# Patient Record
Sex: Male | Born: 1935 | Race: White | Hispanic: No | Marital: Married | Smoking: Former smoker
Health system: Southern US, Community
[De-identification: ages and names within clinical notes are randomized; demographics above are authoritative.]

## PROBLEM LIST (undated history)

## (undated) ENCOUNTER — Ambulatory Visit

## (undated) DIAGNOSIS — E785 Hyperlipidemia, unspecified: Secondary | ICD-10-CM

## (undated) DIAGNOSIS — I2510 Atherosclerotic heart disease of native coronary artery without angina pectoris: Secondary | ICD-10-CM

## (undated) DIAGNOSIS — Z8546 Personal history of malignant neoplasm of prostate: Secondary | ICD-10-CM

## (undated) DIAGNOSIS — B0229 Other postherpetic nervous system involvement: Secondary | ICD-10-CM

## (undated) DIAGNOSIS — E0590 Thyrotoxicosis, unspecified without thyrotoxic crisis or storm: Secondary | ICD-10-CM

## (undated) DIAGNOSIS — H9190 Unspecified hearing loss, unspecified ear: Secondary | ICD-10-CM

## (undated) DIAGNOSIS — R7303 Prediabetes: Secondary | ICD-10-CM

## (undated) DIAGNOSIS — G5792 Unspecified mononeuropathy of left lower limb: Secondary | ICD-10-CM

## (undated) DIAGNOSIS — Z860100 Personal history of colon polyps, unspecified: Secondary | ICD-10-CM

## (undated) DIAGNOSIS — N189 Chronic kidney disease, unspecified: Secondary | ICD-10-CM

## (undated) DIAGNOSIS — I10 Essential (primary) hypertension: Secondary | ICD-10-CM

## (undated) DIAGNOSIS — G609 Hereditary and idiopathic neuropathy, unspecified: Secondary | ICD-10-CM

## (undated) DIAGNOSIS — IMO0002 Reserved for concepts with insufficient information to code with codable children: Secondary | ICD-10-CM

## (undated) DIAGNOSIS — K219 Gastro-esophageal reflux disease without esophagitis: Secondary | ICD-10-CM

## (undated) DIAGNOSIS — R519 Headache, unspecified: Secondary | ICD-10-CM

## (undated) DIAGNOSIS — H8110 Benign paroxysmal vertigo, unspecified ear: Secondary | ICD-10-CM

## (undated) DIAGNOSIS — I872 Venous insufficiency (chronic) (peripheral): Secondary | ICD-10-CM

## (undated) DIAGNOSIS — M545 Low back pain: Secondary | ICD-10-CM

## (undated) DIAGNOSIS — I509 Heart failure, unspecified: Secondary | ICD-10-CM

## (undated) DIAGNOSIS — K5150 Left sided colitis without complications: Secondary | ICD-10-CM

## (undated) DIAGNOSIS — Z9889 Other specified postprocedural states: Secondary | ICD-10-CM

## (undated) DIAGNOSIS — G2581 Restless legs syndrome: Secondary | ICD-10-CM

## (undated) DIAGNOSIS — C801 Malignant (primary) neoplasm, unspecified: Secondary | ICD-10-CM

## (undated) DIAGNOSIS — L500 Allergic urticaria: Secondary | ICD-10-CM

## (undated) HISTORY — DX: Left sided colitis without complications: K51.50

## (undated) HISTORY — PX: RETINAL LASER PROCEDURE: SHX2339

## (undated) HISTORY — DX: Essential (primary) hypertension: I10

## (undated) HISTORY — DX: Reserved for concepts with insufficient information to code with codable children: IMO0002

## (undated) HISTORY — DX: Other postherpetic nervous system involvement: B02.29

## (undated) HISTORY — DX: Chronic kidney disease, unspecified: N18.9

## (undated) HISTORY — DX: Gastro-esophageal reflux disease without esophagitis: K21.9

## (undated) HISTORY — PX: ROTATOR CUFF REPAIR: SHX139

## (undated) HISTORY — DX: Thyrotoxicosis, unspecified without thyrotoxic crisis or storm: E05.90

## (undated) HISTORY — DX: Hereditary and idiopathic neuropathy, unspecified: G60.9

## (undated) HISTORY — DX: Heart failure, unspecified: I50.9

## (undated) HISTORY — DX: Allergic urticaria: L50.0

## (undated) HISTORY — DX: Personal history of colonic polyps: Z86.010

## (undated) HISTORY — DX: Venous insufficiency (chronic) (peripheral): I87.2

## (undated) HISTORY — DX: Personal history of colon polyps, unspecified: Z86.0100

## (undated) HISTORY — DX: Restless legs syndrome: G25.81

## (undated) HISTORY — DX: Unspecified mononeuropathy of left lower limb: G57.92

## (undated) HISTORY — DX: Atherosclerotic heart disease of native coronary artery without angina pectoris: I25.10

## (undated) HISTORY — DX: Personal history of malignant neoplasm of prostate: Z85.46

## (undated) HISTORY — PX: TONSILLECTOMY: SUR1361

## (undated) HISTORY — DX: Benign paroxysmal vertigo, unspecified ear: H81.10

## (undated) HISTORY — PX: PROSTATECTOMY: SHX69

## (undated) HISTORY — PX: ANGIOPLASTY: SHX39

## (undated) HISTORY — DX: Hyperlipidemia, unspecified: E78.5

## (undated) HISTORY — DX: Low back pain: M54.5

## (undated) HISTORY — DX: Unspecified hearing loss, unspecified ear: H91.90

## (undated) HISTORY — PX: INGUINAL HERNIA REPAIR: SUR1180

---
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
## 1998-02-08 ENCOUNTER — Ambulatory Visit (HOSPITAL_COMMUNITY): Admission: RE | Admit: 1998-02-08 | Discharge: 1998-02-08 | Payer: Self-pay | Admitting: Urology

## 1998-07-26 ENCOUNTER — Ambulatory Visit (HOSPITAL_COMMUNITY): Admission: RE | Admit: 1998-07-26 | Discharge: 1998-07-26 | Payer: Self-pay | Admitting: *Deleted

## 1998-09-07 DIAGNOSIS — K5150 Left sided colitis without complications: Secondary | ICD-10-CM

## 1998-09-07 HISTORY — DX: Left sided colitis without complications: K51.50

## 1999-04-02 ENCOUNTER — Ambulatory Visit (HOSPITAL_COMMUNITY): Admission: RE | Admit: 1999-04-02 | Discharge: 1999-04-03 | Payer: Self-pay | Admitting: Cardiology

## 1999-04-15 ENCOUNTER — Encounter (HOSPITAL_COMMUNITY): Admission: RE | Admit: 1999-04-15 | Discharge: 1999-07-14 | Payer: Self-pay | Admitting: Cardiology

## 2001-01-31 ENCOUNTER — Emergency Department (HOSPITAL_COMMUNITY): Admission: EM | Admit: 2001-01-31 | Discharge: 2001-01-31 | Payer: Self-pay | Admitting: Emergency Medicine

## 2001-01-31 ENCOUNTER — Encounter: Payer: Self-pay | Admitting: Emergency Medicine

## 2003-05-11 ENCOUNTER — Encounter (INDEPENDENT_AMBULATORY_CARE_PROVIDER_SITE_OTHER): Payer: Self-pay | Admitting: Specialist

## 2003-05-11 ENCOUNTER — Encounter: Payer: Self-pay | Admitting: Internal Medicine

## 2003-05-11 ENCOUNTER — Ambulatory Visit (HOSPITAL_COMMUNITY): Admission: RE | Admit: 2003-05-11 | Discharge: 2003-05-11 | Payer: Self-pay | Admitting: *Deleted

## 2003-06-28 ENCOUNTER — Ambulatory Visit: Admission: RE | Admit: 2003-06-28 | Discharge: 2003-08-30 | Payer: Self-pay | Admitting: Radiation Oncology

## 2005-06-26 ENCOUNTER — Encounter: Payer: Self-pay | Admitting: Internal Medicine

## 2005-06-26 ENCOUNTER — Encounter (INDEPENDENT_AMBULATORY_CARE_PROVIDER_SITE_OTHER): Payer: Self-pay | Admitting: Specialist

## 2005-06-26 ENCOUNTER — Ambulatory Visit (HOSPITAL_COMMUNITY): Admission: RE | Admit: 2005-06-26 | Discharge: 2005-06-26 | Payer: Self-pay | Admitting: *Deleted

## 2006-12-06 ENCOUNTER — Encounter: Payer: Self-pay | Admitting: Internal Medicine

## 2007-02-24 ENCOUNTER — Encounter: Payer: Self-pay | Admitting: Internal Medicine

## 2007-05-20 ENCOUNTER — Encounter: Payer: Self-pay | Admitting: Internal Medicine

## 2007-07-07 ENCOUNTER — Ambulatory Visit: Payer: Self-pay | Admitting: Family Medicine

## 2007-07-07 DIAGNOSIS — I10 Essential (primary) hypertension: Secondary | ICD-10-CM | POA: Insufficient documentation

## 2007-07-07 DIAGNOSIS — I2510 Atherosclerotic heart disease of native coronary artery without angina pectoris: Secondary | ICD-10-CM | POA: Insufficient documentation

## 2007-07-07 DIAGNOSIS — I25119 Atherosclerotic heart disease of native coronary artery with unspecified angina pectoris: Secondary | ICD-10-CM | POA: Insufficient documentation

## 2007-07-07 DIAGNOSIS — R197 Diarrhea, unspecified: Secondary | ICD-10-CM | POA: Insufficient documentation

## 2007-07-07 DIAGNOSIS — Z8546 Personal history of malignant neoplasm of prostate: Secondary | ICD-10-CM

## 2007-07-07 HISTORY — DX: Personal history of malignant neoplasm of prostate: Z85.46

## 2007-07-21 ENCOUNTER — Encounter: Payer: Self-pay | Admitting: Family Medicine

## 2007-09-19 ENCOUNTER — Encounter: Payer: Self-pay | Admitting: Internal Medicine

## 2007-09-30 DIAGNOSIS — H8110 Benign paroxysmal vertigo, unspecified ear: Secondary | ICD-10-CM

## 2007-09-30 DIAGNOSIS — H8113 Benign paroxysmal vertigo, bilateral: Secondary | ICD-10-CM | POA: Insufficient documentation

## 2007-09-30 HISTORY — DX: Benign paroxysmal vertigo, unspecified ear: H81.10

## 2007-10-11 ENCOUNTER — Ambulatory Visit: Payer: Self-pay | Admitting: Family Medicine

## 2007-10-11 DIAGNOSIS — G609 Hereditary and idiopathic neuropathy, unspecified: Secondary | ICD-10-CM

## 2007-10-11 HISTORY — DX: Hereditary and idiopathic neuropathy, unspecified: G60.9

## 2007-10-11 LAB — CONVERTED CEMR LAB
ALT: 26 units/L (ref 0–53)
AST: 20 units/L (ref 0–37)
Albumin: 4.3 g/dL (ref 3.5–5.2)
Alkaline Phosphatase: 69 units/L (ref 39–117)
BUN: 19 mg/dL (ref 6–23)
Basophils Absolute: 0 10*3/uL (ref 0.0–0.1)
Basophils Relative: 0.5 % (ref 0.0–1.0)
Bilirubin, Direct: 0.2 mg/dL (ref 0.0–0.3)
CO2: 26 meq/L (ref 19–32)
Calcium: 9.3 mg/dL (ref 8.4–10.5)
Chloride: 104 meq/L (ref 96–112)
Creatinine, Ser: 1.4 mg/dL (ref 0.4–1.5)
Eosinophils Absolute: 0.2 10*3/uL (ref 0.0–0.6)
Eosinophils Relative: 4.1 % (ref 0.0–5.0)
Folate: 8.2 ng/mL
GFR calc Af Amer: 65 mL/min
GFR calc non Af Amer: 53 mL/min
Glucose, Bld: 103 mg/dL — ABNORMAL HIGH (ref 70–99)
HCT: 42.1 % (ref 39.0–52.0)
Hemoglobin: 14 g/dL (ref 13.0–17.0)
Hgb A1c MFr Bld: 5.7 % (ref 4.6–6.0)
Lymphocytes Relative: 33.6 % (ref 12.0–46.0)
MCHC: 33.2 g/dL (ref 30.0–36.0)
MCV: 87.7 fL (ref 78.0–100.0)
Monocytes Absolute: 0.5 10*3/uL (ref 0.2–0.7)
Monocytes Relative: 9 % (ref 3.0–11.0)
Neutro Abs: 2.8 10*3/uL (ref 1.4–7.7)
Neutrophils Relative %: 52.8 % (ref 43.0–77.0)
Platelets: 229 10*3/uL (ref 150–400)
Potassium: 4.1 meq/L (ref 3.5–5.1)
RBC: 4.8 M/uL (ref 4.22–5.81)
RDW: 12.8 % (ref 11.5–14.6)
Sodium: 138 meq/L (ref 135–145)
TSH: 1.66 microintl units/mL (ref 0.35–5.50)
Total Bilirubin: 0.8 mg/dL (ref 0.3–1.2)
Total Protein: 6.5 g/dL (ref 6.0–8.3)
Vitamin B-12: 220 pg/mL (ref 211–911)
WBC: 5.3 10*3/uL (ref 4.5–10.5)

## 2007-10-14 ENCOUNTER — Telehealth (INDEPENDENT_AMBULATORY_CARE_PROVIDER_SITE_OTHER): Payer: Self-pay | Admitting: *Deleted

## 2007-12-05 ENCOUNTER — Encounter: Payer: Self-pay | Admitting: Internal Medicine

## 2008-02-02 ENCOUNTER — Ambulatory Visit: Payer: Self-pay | Admitting: Internal Medicine

## 2008-02-29 ENCOUNTER — Inpatient Hospital Stay (HOSPITAL_COMMUNITY): Admission: AD | Admit: 2008-02-29 | Discharge: 2008-03-02 | Payer: Self-pay | Admitting: Interventional Cardiology

## 2008-03-15 ENCOUNTER — Encounter (HOSPITAL_COMMUNITY): Admission: RE | Admit: 2008-03-15 | Discharge: 2008-06-13 | Payer: Self-pay | Admitting: Cardiology

## 2008-03-19 ENCOUNTER — Telehealth: Payer: Self-pay | Admitting: *Deleted

## 2008-04-16 ENCOUNTER — Ambulatory Visit: Payer: Self-pay | Admitting: Family Medicine

## 2008-04-16 LAB — CONVERTED CEMR LAB
ALT: 22 units/L (ref 0–53)
AST: 21 units/L (ref 0–37)
Albumin: 4.3 g/dL (ref 3.5–5.2)
Alkaline Phosphatase: 62 units/L (ref 39–117)
BUN: 16 mg/dL (ref 6–23)
Basophils Absolute: 0 10*3/uL (ref 0.0–0.1)
Basophils Relative: 0.6 % (ref 0.0–3.0)
Bilirubin Urine: NEGATIVE
Bilirubin, Direct: 0.1 mg/dL (ref 0.0–0.3)
Blood in Urine, dipstick: NEGATIVE
CO2: 31 meq/L (ref 19–32)
Calcium: 9.3 mg/dL (ref 8.4–10.5)
Chloride: 105 meq/L (ref 96–112)
Cholesterol: 137 mg/dL (ref 0–200)
Creatinine, Ser: 1.3 mg/dL (ref 0.4–1.5)
Direct LDL: 54.8 mg/dL
Eosinophils Absolute: 0.2 10*3/uL (ref 0.0–0.7)
Eosinophils Relative: 3.6 % (ref 0.0–5.0)
GFR calc Af Amer: 70 mL/min
GFR calc non Af Amer: 58 mL/min
Glucose, Bld: 103 mg/dL — ABNORMAL HIGH (ref 70–99)
Glucose, Urine, Semiquant: NEGATIVE
HCT: 41 % (ref 39.0–52.0)
HDL: 43.8 mg/dL (ref >39.0)
Hemoglobin: 14.1 g/dL (ref 13.0–17.0)
Ketones, urine, test strip: NEGATIVE
Lymphocytes Relative: 24.8 % (ref 12.0–46.0)
MCHC: 34.4 g/dL (ref 30.0–36.0)
MCV: 88.8 fL (ref 78.0–100.0)
Monocytes Absolute: 0.4 10*3/uL (ref 0.1–1.0)
Monocytes Relative: 7.3 % (ref 3.0–12.0)
Neutro Abs: 3.8 10*3/uL (ref 1.4–7.7)
Neutrophils Relative %: 63.7 % (ref 43.0–77.0)
Nitrite: NEGATIVE
PSA: 0.14 ng/mL (ref 0.10–4.00)
Platelets: 232 10*3/uL (ref 150–400)
Potassium: 4.4 meq/L (ref 3.5–5.1)
Protein, U semiquant: NEGATIVE
RBC: 4.62 M/uL (ref 4.22–5.81)
RDW: 13.2 % (ref 11.5–14.6)
Sodium: 140 meq/L (ref 135–145)
Specific Gravity, Urine: 1.015
TSH: 2.6 microintl units/mL (ref 0.35–5.50)
Total Bilirubin: 0.9 mg/dL (ref 0.3–1.2)
Total CHOL/HDL Ratio: 3.1
Total Protein: 7 g/dL (ref 6.0–8.3)
Triglycerides: 209 mg/dL (ref 0–149)
Urobilinogen, UA: 0.2
VLDL: 42 mg/dL — ABNORMAL HIGH (ref 0–40)
WBC Urine, dipstick: NEGATIVE
WBC: 5.8 10*3/uL (ref 4.5–10.5)
pH: 6.5

## 2008-04-18 ENCOUNTER — Ambulatory Visit: Payer: Self-pay | Admitting: Family Medicine

## 2008-04-18 DIAGNOSIS — I739 Peripheral vascular disease, unspecified: Secondary | ICD-10-CM | POA: Insufficient documentation

## 2008-04-18 DIAGNOSIS — K219 Gastro-esophageal reflux disease without esophagitis: Secondary | ICD-10-CM | POA: Insufficient documentation

## 2008-04-19 ENCOUNTER — Encounter: Payer: Self-pay | Admitting: Internal Medicine

## 2008-06-04 ENCOUNTER — Encounter: Payer: Self-pay | Admitting: Internal Medicine

## 2008-06-13 ENCOUNTER — Encounter: Payer: Self-pay | Admitting: Internal Medicine

## 2008-06-14 ENCOUNTER — Encounter (HOSPITAL_COMMUNITY): Admission: RE | Admit: 2008-06-14 | Discharge: 2008-09-03 | Payer: Self-pay | Admitting: Cardiology

## 2008-06-14 ENCOUNTER — Encounter: Payer: Self-pay | Admitting: Internal Medicine

## 2008-08-20 ENCOUNTER — Encounter: Payer: Self-pay | Admitting: Internal Medicine

## 2008-10-11 ENCOUNTER — Encounter: Payer: Self-pay | Admitting: Internal Medicine

## 2008-11-08 ENCOUNTER — Encounter: Payer: Self-pay | Admitting: Internal Medicine

## 2008-12-14 ENCOUNTER — Encounter: Payer: Self-pay | Admitting: Internal Medicine

## 2009-02-05 ENCOUNTER — Telehealth: Payer: Self-pay | Admitting: Family Medicine

## 2009-04-22 ENCOUNTER — Telehealth: Payer: Self-pay | Admitting: Family Medicine

## 2009-05-06 ENCOUNTER — Encounter: Payer: Self-pay | Admitting: Internal Medicine

## 2009-05-08 ENCOUNTER — Ambulatory Visit: Payer: Self-pay | Admitting: Family Medicine

## 2009-05-08 LAB — CONVERTED CEMR LAB
Albumin: 4.2 g/dL (ref 3.5–5.2)
Alkaline Phosphatase: 58 units/L (ref 39–117)
BUN: 16 mg/dL (ref 6–23)
Basophils Absolute: 0 10*3/uL (ref 0.0–0.1)
Blood in Urine, dipstick: NEGATIVE
CO2: 29 meq/L (ref 19–32)
Calcium: 9.2 mg/dL (ref 8.4–10.5)
Cholesterol: 128 mg/dL (ref 0–200)
Creatinine, Ser: 1.2 mg/dL (ref 0.4–1.5)
Eosinophils Absolute: 0.3 10*3/uL (ref 0.0–0.7)
GFR calc non Af Amer: 63.44 mL/min (ref >60)
Glucose, Bld: 99 mg/dL (ref 70–99)
Glucose, Urine, Semiquant: NEGATIVE
HDL: 44.2 mg/dL (ref >39.00)
Hemoglobin: 14.5 g/dL (ref 13.0–17.0)
Ketones, urine, test strip: NEGATIVE
Lymphocytes Relative: 35.1 % (ref 12.0–46.0)
MCHC: 34.3 g/dL (ref 30.0–36.0)
Neutro Abs: 2.5 10*3/uL (ref 1.4–7.7)
Neutrophils Relative %: 51.3 % (ref 43.0–77.0)
Platelets: 200 10*3/uL (ref 150.0–400.0)
Protein, U semiquant: NEGATIVE
RDW: 13.5 % (ref 11.5–14.6)
Specific Gravity, Urine: >=1.03
Total Bilirubin: 0.8 mg/dL (ref 0.3–1.2)
Triglycerides: 151 mg/dL — ABNORMAL HIGH (ref 0.0–149.0)
VLDL: 30.2 mg/dL (ref 0.0–40.0)
pH: 5

## 2009-05-31 ENCOUNTER — Ambulatory Visit: Payer: Self-pay | Admitting: Family Medicine

## 2009-06-03 ENCOUNTER — Emergency Department (HOSPITAL_COMMUNITY): Admission: EM | Admit: 2009-06-03 | Discharge: 2009-06-03 | Payer: Self-pay | Admitting: Emergency Medicine

## 2009-06-06 ENCOUNTER — Encounter (INDEPENDENT_AMBULATORY_CARE_PROVIDER_SITE_OTHER): Payer: Self-pay | Admitting: *Deleted

## 2009-06-06 ENCOUNTER — Encounter: Payer: Self-pay | Admitting: Internal Medicine

## 2009-06-06 HISTORY — PX: COLONOSCOPY W/ BIOPSIES AND POLYPECTOMY: SHX1376

## 2009-07-30 ENCOUNTER — Ambulatory Visit: Payer: Self-pay | Admitting: Family Medicine

## 2009-07-30 DIAGNOSIS — L0291 Cutaneous abscess, unspecified: Secondary | ICD-10-CM | POA: Insufficient documentation

## 2009-07-30 DIAGNOSIS — L0390 Cellulitis, unspecified: Secondary | ICD-10-CM

## 2009-07-31 ENCOUNTER — Ambulatory Visit: Payer: Self-pay | Admitting: Family Medicine

## 2009-08-05 DIAGNOSIS — L500 Allergic urticaria: Secondary | ICD-10-CM

## 2009-08-05 HISTORY — DX: Allergic urticaria: L50.0

## 2009-08-07 ENCOUNTER — Ambulatory Visit: Payer: Self-pay | Admitting: Family Medicine

## 2009-10-18 ENCOUNTER — Ambulatory Visit: Payer: Self-pay | Admitting: Family Medicine

## 2009-10-18 DIAGNOSIS — M545 Low back pain, unspecified: Secondary | ICD-10-CM

## 2009-10-18 HISTORY — DX: Low back pain, unspecified: M54.50

## 2009-10-18 LAB — CONVERTED CEMR LAB
Bilirubin Urine: NEGATIVE
Glucose, Urine, Semiquant: NEGATIVE
Ketones, urine, test strip: NEGATIVE
Protein, U semiquant: NEGATIVE
pH: 6

## 2009-10-21 ENCOUNTER — Encounter (INDEPENDENT_AMBULATORY_CARE_PROVIDER_SITE_OTHER): Payer: Self-pay | Admitting: *Deleted

## 2009-11-25 ENCOUNTER — Ambulatory Visit: Payer: Self-pay | Admitting: Internal Medicine

## 2009-12-27 DIAGNOSIS — Z860100 Personal history of colon polyps, unspecified: Secondary | ICD-10-CM | POA: Insufficient documentation

## 2009-12-27 DIAGNOSIS — K5150 Left sided colitis without complications: Secondary | ICD-10-CM | POA: Insufficient documentation

## 2010-01-22 ENCOUNTER — Ambulatory Visit: Payer: Self-pay | Admitting: Family Medicine

## 2010-01-23 ENCOUNTER — Telehealth: Payer: Self-pay | Admitting: Family Medicine

## 2010-02-06 ENCOUNTER — Encounter: Payer: Self-pay | Admitting: Family Medicine

## 2010-02-19 ENCOUNTER — Encounter: Payer: Self-pay | Admitting: Family Medicine

## 2010-02-20 ENCOUNTER — Telehealth: Payer: Self-pay | Admitting: Family Medicine

## 2010-02-25 ENCOUNTER — Encounter: Payer: Self-pay | Admitting: Family Medicine

## 2010-02-27 ENCOUNTER — Telehealth: Payer: Self-pay | Admitting: Family Medicine

## 2010-05-13 ENCOUNTER — Telehealth: Payer: Self-pay | Admitting: Family Medicine

## 2010-05-20 ENCOUNTER — Ambulatory Visit: Payer: Self-pay | Admitting: Family Medicine

## 2010-05-26 ENCOUNTER — Ambulatory Visit: Payer: Self-pay | Admitting: Family Medicine

## 2010-05-29 ENCOUNTER — Ambulatory Visit: Payer: Self-pay | Admitting: Family Medicine

## 2010-06-02 ENCOUNTER — Ambulatory Visit: Payer: Self-pay | Admitting: Family Medicine

## 2010-06-02 ENCOUNTER — Telehealth: Payer: Self-pay | Admitting: Family Medicine

## 2010-06-03 ENCOUNTER — Encounter: Payer: Self-pay | Admitting: Family Medicine

## 2010-06-06 ENCOUNTER — Ambulatory Visit: Payer: Self-pay | Admitting: Family Medicine

## 2010-06-06 LAB — CONVERTED CEMR LAB
AST: 17 units/L (ref 0–37)
Albumin: 4.5 g/dL (ref 3.5–5.2)
Basophils Absolute: 0 10*3/uL (ref 0.0–0.1)
CO2: 28 meq/L (ref 19–32)
Chloride: 98 meq/L (ref 96–112)
Eosinophils Absolute: 0.1 10*3/uL (ref 0.0–0.7)
Glucose, Bld: 100 mg/dL — ABNORMAL HIGH (ref 70–99)
Glucose, Urine, Semiquant: NEGATIVE
HCT: 41 % (ref 39.0–52.0)
Hemoglobin: 13.9 g/dL (ref 13.0–17.0)
Lymphs Abs: 2.3 10*3/uL (ref 0.7–4.0)
MCHC: 33.9 g/dL (ref 30.0–36.0)
Monocytes Relative: 4.1 % (ref 3.0–12.0)
Neutro Abs: 13 10*3/uL — ABNORMAL HIGH (ref 1.4–7.7)
Potassium: 4.4 meq/L (ref 3.5–5.1)
RDW: 14.1 % (ref 11.5–14.6)
Sodium: 134 meq/L — ABNORMAL LOW (ref 135–145)
Specific Gravity, Urine: >=1.03
TSH: 2.71 microintl units/mL (ref 0.35–5.50)
WBC Urine, dipstick: NEGATIVE
pH: 5

## 2010-06-12 ENCOUNTER — Ambulatory Visit: Payer: Self-pay | Admitting: Family Medicine

## 2010-06-12 ENCOUNTER — Encounter (INDEPENDENT_AMBULATORY_CARE_PROVIDER_SITE_OTHER): Payer: Self-pay | Admitting: *Deleted

## 2010-06-12 ENCOUNTER — Encounter: Payer: Self-pay | Admitting: Family Medicine

## 2010-06-16 ENCOUNTER — Ambulatory Visit: Payer: Self-pay | Admitting: Internal Medicine

## 2010-06-19 ENCOUNTER — Ambulatory Visit: Payer: Self-pay | Admitting: Family Medicine

## 2010-07-03 ENCOUNTER — Ambulatory Visit: Payer: Self-pay | Admitting: Family Medicine

## 2010-07-03 DIAGNOSIS — B0223 Postherpetic polyneuropathy: Secondary | ICD-10-CM | POA: Insufficient documentation

## 2010-07-03 DIAGNOSIS — B0229 Other postherpetic nervous system involvement: Secondary | ICD-10-CM

## 2010-07-03 HISTORY — DX: Other postherpetic nervous system involvement: B02.29

## 2010-07-28 ENCOUNTER — Ambulatory Visit: Payer: Self-pay | Admitting: Family Medicine

## 2010-07-28 DIAGNOSIS — I872 Venous insufficiency (chronic) (peripheral): Secondary | ICD-10-CM | POA: Insufficient documentation

## 2010-07-28 HISTORY — DX: Venous insufficiency (chronic) (peripheral): I87.2

## 2010-08-05 ENCOUNTER — Telehealth: Payer: Self-pay | Admitting: Family Medicine

## 2010-08-11 DIAGNOSIS — D649 Anemia, unspecified: Secondary | ICD-10-CM | POA: Insufficient documentation

## 2010-08-11 DIAGNOSIS — R079 Chest pain, unspecified: Secondary | ICD-10-CM | POA: Insufficient documentation

## 2010-08-12 ENCOUNTER — Ambulatory Visit: Payer: Self-pay | Admitting: Cardiology

## 2010-08-12 DIAGNOSIS — R0602 Shortness of breath: Secondary | ICD-10-CM | POA: Insufficient documentation

## 2010-08-13 ENCOUNTER — Encounter: Payer: Self-pay | Admitting: Cardiology

## 2010-08-13 LAB — CONVERTED CEMR LAB
BUN: 48 mg/dL — ABNORMAL HIGH (ref 6–23)
GFR calc non Af Amer: 28.96 mL/min — ABNORMAL LOW (ref >60.00)
Potassium: 3.9 meq/L (ref 3.5–5.1)
Pro B Natriuretic peptide (BNP): 48.9 pg/mL (ref 0.0–100.0)

## 2010-08-20 ENCOUNTER — Telehealth (INDEPENDENT_AMBULATORY_CARE_PROVIDER_SITE_OTHER): Payer: Self-pay | Admitting: Radiology

## 2010-08-21 ENCOUNTER — Ambulatory Visit (HOSPITAL_COMMUNITY)
Admission: RE | Admit: 2010-08-21 | Discharge: 2010-08-21 | Payer: Self-pay | Source: Home / Self Care | Attending: Cardiology | Admitting: Cardiology

## 2010-08-21 ENCOUNTER — Encounter: Payer: Self-pay | Admitting: Cardiovascular Disease

## 2010-08-21 ENCOUNTER — Encounter (HOSPITAL_COMMUNITY)
Admission: RE | Admit: 2010-08-21 | Discharge: 2010-10-07 | Payer: Self-pay | Source: Home / Self Care | Attending: Cardiology | Admitting: Cardiology

## 2010-08-21 ENCOUNTER — Ambulatory Visit: Payer: Self-pay | Admitting: Cardiology

## 2010-08-21 ENCOUNTER — Ambulatory Visit: Payer: Self-pay

## 2010-08-21 ENCOUNTER — Encounter: Payer: Self-pay | Admitting: Cardiology

## 2010-08-21 DIAGNOSIS — R609 Edema, unspecified: Secondary | ICD-10-CM | POA: Insufficient documentation

## 2010-08-22 LAB — CONVERTED CEMR LAB
BUN: 15 mg/dL (ref 6–23)
Chloride: 106 meq/L (ref 96–112)
Creatinine, Ser: 1.3 mg/dL (ref 0.4–1.5)
Glucose, Bld: 93 mg/dL (ref 70–99)
Pro B Natriuretic peptide (BNP): 189.6 pg/mL — ABNORMAL HIGH (ref 0.0–100.0)

## 2010-09-05 ENCOUNTER — Ambulatory Visit: Payer: Self-pay | Admitting: Cardiology

## 2010-09-05 ENCOUNTER — Ambulatory Visit: Payer: Self-pay

## 2010-09-09 ENCOUNTER — Telehealth: Payer: Self-pay | Admitting: Family Medicine

## 2010-09-11 ENCOUNTER — Telehealth: Payer: Self-pay | Admitting: Cardiology

## 2010-09-19 ENCOUNTER — Telehealth: Payer: Self-pay | Admitting: Cardiology

## 2010-10-07 NOTE — Progress Notes (Signed)
Summary: fu hip xray  Phone Note Call from Patient Call back at Home Phone [phone]   Caller: Patient Call For: Dorena Cookey MD Summary of Call: pt had hip xray in may 2011 pt is request fu hip xray before cpx in oct 2011 Initial call taken by: Glo Herring,  May 13, 2010 2:56 PM  Follow-up for Phone Call        please ............... x-ray of his left femur,,,,,,,, x-ray in May, showed a proximal left cystic-type lesion.  Radiologist recommended follow-up in 3 months Follow-up by: Dorena Cookey MD,  May 13, 2010 3:02 PM  Additional Follow-up for Phone Call Additional follow up Details #1::        patient is aware Additional Follow-up by: Westley Hummer CMA Deborra Medina),  May 13, 2010 3:36 PM

## 2010-10-07 NOTE — Miscellaneous (Signed)
Summary: Physical Therapy Initial Evaluation/[facility]  Physical Therapy Initial Evaluation/[facility]   Imported By: Maryln Gottron 02/12/2010 11:13:48  _____________________________________________________________________  External Attachment:    Type:   Image     Comment:   External Document

## 2010-10-07 NOTE — Letter (Signed)
Summary: [facility]  [facility]   Imported By: Bubba Hales 12/31/2009 11:19:42  _____________________________________________________________________  External Attachment:    Type:   Image     Comment:   External Document

## 2010-10-07 NOTE — Medication Information (Signed)
Summary: Protonix Approved  Protonix Approved   Imported By: Laural Benes 02/27/2010 13:55:41  _____________________________________________________________________  External Attachment:    Type:   Image     Comment:   External Document

## 2010-10-07 NOTE — Assessment & Plan Note (Signed)
Summary: pain in lower left side of back for 2-3 wks/cjr   Vital Signs:  Patient profile:   75 year old male Weight:      207 pounds Temp:     98.5 degrees F oral BP sitting:   110 / 78  (left arm) Cuff size:   regular  Vitals Entered By: Kristopher Wilson CMA Duncan Dull) (October 18, 2009 9:04 AM)  Reason for Visit lower left back pain  History of Present Illness: Kristopher Wilson is a 75 year old male, married, nonsmoker, who comes in today for evaluation of the left lumbar back pain.  He, states it's been present for 4 to 5 weeks.  No trauma.  He describes the pain as constant, dull, a 4 on a scale of one to 10 nonradiating.  He points to the left SI notch as the source of his pain.  Neurologic review of systems negative.  12 .general review of systems negative specifically, no bowel.  No bladder dysfunction pain does not keep him awake at night, etc.  Allergies: 1)  ! Keflex  Past History:  Past medical, surgical, family and social histories (including risk factors) reviewed for relevance to current acute and chronic problems.  Past Medical History: Reviewed history from 04/18/2008 and no changes required. Prostate cancer, hx of Hypertension hyperlipidemia Coronary artery disease ulcerative colitis GERD stent RCA, June 2009 Peripheral vascular disease  Past Surgical History: Reviewed history from 07/07/2007 and no changes required. Prostatectomy  ?-PROSTATE CANCER Inguinal herniorrhaphy? X 5 LAST ONE IN 80'S  Family History: Reviewed history from 04/18/2008 and no changes required. Family History of CAD Male 1st degree relative <60 Family History High cholesterol Family History Hypertension  Social History: Reviewed history from 07/07/2007 and no changes required. Retired  from [employer].  Now works at [employer] course Married Former Smoker Alcohol use-yes Drug use-no Regular exercise-yes  Review of Systems      See HPI  Physical Exam  General:   Well-developed,well-nourished,in no acute distress; alert,appropriate and cooperative throughout examination Msk:  No deformity or scoliosis noted of thoracic or lumbar spine.  .......Marland Kitchentenderness left SI notch Pulses:  R and L carotid,radial,femoral,dorsalis pedis and posterior tibial pulses are full and equal bilaterally Extremities:  No clubbing, cyanosis, edema, or deformity noted with normal full range of motion of all joints.   Neurologic:  No cranial nerve deficits noted. Station and gait are normal. Plantar reflexes are down-going bilaterally. DTRs are symmetrical throughout. Sensory, motor and coordinative functions appear intact.   Impression & Recommendations:  Problem # 1:  BACK PAIN, LUMBAR (ICD-724.2) Assessment New  Orders: UA Dipstick w/o Micro (manual) (16109) Prescription Created Electronically [phone])  His updated medication list for this problem includes:    Flexeril 5 Mg Tabs (Cyclobenzaprine hcl) .Marland Kitchen... 1 tab @ bedtime    Vicodin Es 7.5-750 Mg Tabs (Hydrocodone-acetaminophen) .Marland Kitchen... 1 tab @ bedtime  Complete Medication List: 1)  Crestor 20 Mg Tabs (Rosuvastatin calcium) .Marland Kitchen.. 1 once daily 2)  Asacol 400 Mg Tbec (Mesalamine) .... 4 tab two times a day 3)  [facility]vs Folic Acid 400 Mcg Tabs (Folic acid) 4)  Zetia 10 Mg Tabs (Ezetimibe) .... Take 1 tablet by mouth every morning 5)  Asa 325  6)  Plavix 75 Mg Tabs (Clopidogrel bisulfate) .... Take one tab once daily 7)  Exforge Hct 10-320-25 Mg Tabs (Amlodipine-valsartan-hctz) .... One tab once daily 8)  Pantoprazole Sodium 40 Mg Tbec (Pantoprazole sodium) .... Take on tab once daily 9)  Mirapex 1 Mg  Tabs (Pramipexole dihydrochloride) .Marland Kitchen.. 1 tab @ bedtime 10)  Nitrostat 0.4 Mg Subl (Nitroglycerin) .Marland Kitchen.. 1 sl as needed angina 11)  Prednisone 20 Mg Tabs (Prednisone) .... Uad 12)  Flexeril 5 Mg Tabs (Cyclobenzaprine hcl) .Marland Kitchen.. 1 tab @ bedtime 13)  Vicodin Es 7.5-750 Mg Tabs (Hydrocodone-acetaminophen) .Marland Kitchen.. 1 tab @ bedtime  Other  Orders: Gastroenterology Referral (GI)  Patient Instructions: 1)  begin prednisone, take two tabs x 3 days, one x 3 days, a half x 3 days, then a half a tablet Monday, Wednesday, Friday, for a two week taper.  Do  the back stretching exercises twice daily.......Marland Kitchenyoga  2)  At night use a  heating pad...Marland KitchenMarland KitchenMarland Kitchen remember to use low heat, u  may take a half of a Flexeril and/or half of a Vicodin at bedtime as needed for severe pain. 3)  If after 7 to 14 days.  The pain persists call and we will get u  set up for physical therapy  Prescriptions: VICODIN ES 7.5-750 MG TABS (HYDROCODONE-ACETAMINOPHEN) 1 tab @ bedtime  #30 x 0   Entered and Authorized by:   Roderick Pee MD   Signed by:   Roderick Pee MD on 10/18/2009   Method used:   Print then Give to Patient   RxID:   [id number] FLEXERIL 5 MG TABS (CYCLOBENZAPRINE HCL) 1 tab @ bedtime  #30 x 0   Entered and Authorized by:   Roderick Pee MD   Signed by:   Roderick Pee MD on 10/18/2009   Method used:   Print then Give to Patient   RxID:   [id number] PREDNISONE 20 MG TABS (PREDNISONE) UAD  #30 x 1   Entered and Authorized by:   Roderick Pee MD   Signed by:   Roderick Pee MD on 10/18/2009   Method used:   Electronically to        [facility]  [place] [address] (retail)       [address]       [place], [place]  [address]       Ph: [phone] or [phone]       Fax: [phone]   RxID:   [id number]   Laboratory Results   Urine Tests  Date/Time Received: October 18, 2009   Routine Urinalysis   Color: yellow Appearance: Clear Glucose: negative   (Normal Range: Negative) Bilirubin: negative   (Normal Range: Negative) Ketone: negative   (Normal Range: Negative) Spec. Gravity: 1.020   (Normal Range: 1.003-1.035) Blood: negative   (Normal Range: Negative) pH: 6.0   (Normal Range: 5.0-8.0) Protein: negative   (Normal Range: Negative) Urobilinogen: 0.2   (Normal Range: 0-1) Nitrite: negative   (Normal Range:  Negative) Leukocyte Esterace: negative   (Normal Range: Negative)    Comments: Kristopher Wilson CMA Duncan Dull)  October 18, 2009 9:13 AM

## 2010-10-07 NOTE — Letter (Signed)
Summary: [facility]  [facility]   Imported By: Lester Fouke 12/31/2009 11:23:54  _____________________________________________________________________  External Attachment:    Type:   Image     Comment:   External Document

## 2010-10-07 NOTE — Assessment & Plan Note (Signed)
Summary: np6/CAD   Referring Provider:  n/a Primary Provider:  Stevie Kern, MD  CC:  to establish with a cardiologsit.  History of Present Illness: 75 yo with history of CAD, ulcerative colitis, and shingles with post-herpetic neuralgia presents to establish cardiology care.  Patient had PCI in 2000 with BMS to LAD and repeat PCI in 2009 with DES to PLV.  He has had no invasive or noninvasive evaluation since that time.  Patient reports occasional chest tightness that seems atypical: does not occur with exertion, tends to occur while sitting down watching TV.  However, for the last 6-8 weeks he has been getting episodes of pain up the sides of his neck.  This pain tends to occur with exertion such as pushing his garbage can to the street. The pain is not so bad that it stops or slows him.  No exertional dyspnea.  Both of his acute coronary syndrome episodes in the past were heralded by exertional dyspnea.  Finally, patient has had lower extremity edema for a number of months.  He has been taking Lasix 20 mg daily.    Patient likes to play golf.  However, he has had a hard time doing this activity lately due to a left leg neuropathy that has been thought to be due to post-herpetic neuralgia.  He has been seen by neurology for this.   Labs (9/11): K 4.4, creatinine 1.4, TSH normal, LDL 33, HDL 41 Labs (12/11): BNP 48, creatinine 2.4, BUN 48  Current Medications (verified): 1)  Crestor 20 Mg  Tabs (Rosuvastatin Calcium) .Marland Kitchen.. 1 Once Daily 2)  Zetia 10 Mg  Tabs (Ezetimibe) .... Take 1 Tablet By Mouth Every Morning 3)  Aspirin 325 Mg Tabs (Aspirin) .Marland Kitchen.. 1 Tablet By Mouth Once Daily 4)  Plavix 75 Mg  Tabs (Clopidogrel Bisulfate) .... Take One Tab Once Daily 5)  Exforge Hct 10-320-25 Mg Tabs (Amlodipine-Valsartan-Hctz) .... One Tab Once Daily 6)  Pantoprazole Sodium 40 Mg Tbec (Pantoprazole Sodium) .... Take On Tab Once Daily 7)  Nitrostat 0.4 Mg Subl (Nitroglycerin) .Marland Kitchen.. 1 Sl As Needed Angina 8)   Asacol Hd 800 Mg Tbec (Mesalamine) .... 3  Tablets Two  Times A Day 9)  Percocet 10-650 Mg Tabs (Oxycodone-Acetaminophen) .... Take 1 Tablet By Mouth Q 4h 10)  Neurontin 300 Mg Caps (Gabapentin) .... Take 2  Tablet By Mouth 3 X  Times A Day 11)  Lasix 20 Mg Tabs (Furosemide) .... Take 1 Tablet By Mouth Every Morning As Needed  Allergies (verified): 1)  ! Keflex   Past History:  Past Medical History: 1. CORONARY ARTERY DISEASE: PCI 7/00 with BMS to LAD.  PCI 2009 with Promus DES to PLV.   2. HYPERTENSION  3. VENOUS INSUFFICIENCY 4. POSTHERPETIC NEURALGIA: affecting left lower leg.  5. COLONIC POLYPS, ADENOMATOUS, HX OF 6. ULCERATIVE COLITIS, LEFT SIDED  7. BACK PAIN, LUMBAR  8. URTICARIA, ALLERGIC 9. RESTLESS LEG SYNDROME 10. GERD 11. BENIGN PAROXYSMAL POSITIONAL VERTIGO  12. HYPERTHYROIDISM 13. PROSTATE CANCER: s/p prostatectomy 14. Hyperlipidemia 15. CKD  Family History: Family History High cholesterol Family History Hypertension No FH of Colon Cancer: Father died MI age 58 Throat Cancer-Brother died age 33 MS-brother and son Family History of Heart Disease: Father with MI at 53 Family History of Diabetes: Uncle  Social History: Reviewed history from 06/16/2010 and no changes required. Retired  from [employer].  Now works at [employer] course Married Former Smoker-stopped 35 years ago Alcohol use-yes-2 drinks per day Drug  use-no Patient does not get regular exercise.   Review of Systems       All systems reviewed and negative except as per HPI.   Vital Signs:  Patient profile:   75 year old male Height:      75 inches Weight:      205.50 pounds BMI:     25.78 Pulse rate:   76 / minute BP sitting:   104 / 48  (left arm)  Physical Exam  General:  Well developed, well nourished, in no acute distress. Head:  normocephalic and atraumatic Nose:  no deformity, discharge, inflammation, or lesions Mouth:  Teeth, gums and palate normal. Oral  mucosa normal. Neck:  Neck supple, no JVD. No masses, thyromegaly or abnormal cervical nodes. Lungs:  Clear bilaterally to auscultation and percussion. Heart:  Non-displaced PMI, chest non-tender; regular rate and rhythm, S1, S2 without murmurs, rubs or gallops. Carotid upstroke normal, no bruit.  Pedals normal pulses. 1+ edema 1/3 up lower legs bilaterally.  Abdomen:  Bowel sounds positive; abdomen soft and non-tender without masses, organomegaly, or hernias noted. No hepatosplenomegaly. Extremities:  No clubbing or cyanosis. Neurologic:  Alert and oriented x 3. Skin:  Intact without lesions or rashes. Psych:  Normal affect.   Impression & Recommendations:  Problem # 1:  CORONARY ARTERY DISEASE (ICD-414.00) PCI to LAD in 2000 and to PLV in 2009.  Has been having several weeks of mostly exertional pain radiating up the neck on both sides.  Prior ischemic symptom had been exertional dyspnea, which he is not currently having. Given the exertional symptoms, I think that it would be safest to risk stratify him with a Lexiscan myoview (would be hard for him to walk on treadmill with peripheral neuropathy).  Continue Plavix, can decrease ASA to 81 mg daily.  Continue statin.   Problem # 2:  EDEMA Lower extremity edema seems most likely to be due to venous insufficiency given no elevation in neck veins and normal BNP.  Will get echo to assess LV and RV function.  Given elevated creatinine, would hold Lasix for now.  Will have him use graded compression stockings to keep the swelling down.    Problem # 3:  ACUTE RENAL FAILURE Creatinine 2.4 with BUN 38. Creatinine was 1.4 in 9/11.  Possible prerenal azotemia in the setting of Lasix use.  Also getting ARB and HCTZ in Exforge.  I will have him stop Exforge and Lasix. BP is actually running on the low side today.  For BP control off Exforge, I will have him continue amlodipine at 10 mg daily (was getting this in Exforge) and start Coreg 6.25 mg two times a  day.  Repeat BMET in 10 days.  As above, I will ask him to use compression stockings for lower extremity edema.    Followup in 2-3 weeks.   Other Orders: Nuclear Stress Test (Nuc Stress Test) Echocardiogram (Echo) TLB-BMP (Basic Metabolic Panel-BMET) (54627-OJJKKXF) TLB-BNP (B-Natriuretic Peptide) (83880-BNPR)  Patient Instructions: 1)  Your physician has recommended you make the following change in your medication:  2)  Decrease Aspirin to 28m daily--this should be enteric coated. 3)  Lab today--BMP/BNP  4)  Your physician has requested that you have an echocardiogram.  Echocardiography is a painless test that uses sound waves to create images of your heart. It provides your doctor with information about the size and shape of your heart and how well your heart's chambers and valves are working.  This procedure takes approximately one hour.  There are no restrictions for this procedure. 5)  Your physician has requested that you have an Tama.  For further information please visit [email or web address].  Please follow instruction sheet, as given. 6)  Your physician recommends that you schedule a follow-up appointment in: 2-3 weeks with Dr Aundra Dubin.   Vital Signs:  Patient profile:   75 year old male Height:      75 inches Weight:      205.50 pounds BMI:     25.78 Pulse rate:   76 / minute BP sitting:   104 / 48  (left arm)   Serial Vital Signs/Assessments:  Comments: 106/52 right arm sitting By: Desiree Lucy, RN, BSN

## 2010-10-07 NOTE — Assessment & Plan Note (Signed)
Summary: shingles//ccm   Vital Signs:  Patient profile:   75 year old male Weight:      204 pounds Temp:     97.9 degrees F oral BP sitting:   108 / 79  (left arm) Cuff size:   regular  Vitals Entered By: Kern Reap CMA Duncan Dull) (July 03, 2010 11:01 AM) CC: shingles   Primary Care Provider:  Kelle Darting, MD  CC:  shingles.  History of Present Illness: Kristopher Wilson is a 75 year old male, who comes in today for reevaluation of post herpetic neuralgia.  He is currently taking Percocet 10 -- 650 q.i.d., however, is having breakthrough pain.  We decided to increase that to every 4 hours.  We will also increase his Neurontin from 3 mg 3 times a day to 300 mg 4 times a day.  I called the neurology office.  The neurologist inadvertently answered the phone on the Dr. Glennon Hamilton  and has agreed to see him sometime in the next couple weeks for further evaluation  Allergies: 1)  ! Keflex  Social History: Reviewed history from 06/16/2010 and no changes required. Retired  from [employer].  Now works at [employer] course Married Former Smoker-stopped 35 years ago Alcohol use-yes-2 drinks per day Drug use-no Patient does not get regular exercise.   Review of Systems      See HPI  Physical Exam  General:  Well-developed,well-nourished,in no acute distress; alert,appropriate and cooperative throughout examination   Impression & Recommendations:  Problem # 1:  POSTHERPETIC NEURALGIA (ICD-053.19) Assessment Deteriorated  Complete Medication List: 1)  Crestor 20 Mg Tabs (Rosuvastatin calcium) .Marland Kitchen.. 1 once daily 2)  Zetia 10 Mg Tabs (Ezetimibe) .... Take 1 tablet by mouth every morning 3)  Aspirin 325 Mg Tabs (Aspirin) .Marland Kitchen.. 1 tablet by mouth once daily 4)  Plavix 75 Mg Tabs (Clopidogrel bisulfate) .... Take one tab once daily 5)  Exforge Hct 10-320-25 Mg Tabs (Amlodipine-valsartan-hctz) .... One tab once daily 6)  Pantoprazole Sodium 40 Mg Tbec (Pantoprazole sodium) ....  Take on tab once daily 7)  Nitrostat 0.4 Mg Subl (Nitroglycerin) .Marland Kitchen.. 1 sl as needed angina 8)  Asacol Hd 800 Mg Tbec (Mesalamine) .... 2 tablets three times a day 9)  Prednisone 20 Mg Tabs (Prednisone) .... Uad 10)  Zovirax 800 Mg Tabs (Acyclovir) .... Take 1 tablet by mouth three times a day 11)  Percocet 10-650 Mg Tabs (Oxycodone-acetaminophen) .... Take 1 tablet by mouth q 4h 12)  Neurontin 300 Mg Caps (Gabapentin) .... Take 1 tablet by mouth 4 x  times a day 13)  Motrin Ib 200 Mg Tabs (Ibuprofen) .... Take 1 tablet by mouth two times a day  Patient Instructions: 1)  increase the Percocet to one every 4 hours, increase the Neurontin to 300 mg 4 times daily. 2)  The neurology office will call you and get you in to see the neurologist sometime in the next week to 10 days Prescriptions: NEURONTIN 300 MG CAPS (GABAPENTIN) Take 1 tablet by mouth 4 x  times a day  #400 x 2   Entered and Authorized by:   Roderick Pee MD   Signed by:   Roderick Pee MD on 07/03/2010   Method used:   Print then Give to Patient   RxID:   [id number] PERCOCET 10-650 MG TABS (OXYCODONE-ACETAMINOPHEN) Take 1 tablet by mouth q 4h  #200 x 0   Entered and Authorized by:   Roderick Pee MD   Signed by:  Roderick Pee MD on 07/03/2010   Method used:   Print then Give to Patient   RxID:   [id number]    Orders Added: 1)  Est. Patient Level III [29518]

## 2010-10-07 NOTE — Procedures (Signed)
Summary: [facility]  [facility]   Imported By: Lester New Cassel 12/31/2009 11:08:58  _____________________________________________________________________  External Attachment:    Type:   Image     Comment:   External Document

## 2010-10-07 NOTE — Letter (Signed)
Summary: [facility]  [facility]   Imported By: Lester  12/31/2009 11:16:18  _____________________________________________________________________  External Attachment:    Type:   Image     Comment:   External Document

## 2010-10-07 NOTE — Progress Notes (Signed)
Summary: meds oxycodone & endocet together  Phone Note Call from Patient Call back at Home Phone [phone]   Caller: wife,jackie Summary of Call: Question about previous prescribed med Oxycodone 5-325 1-2 q 4 hrs prn pain & whether to continue with 3 new meds - Endocet, Prednisone, Acyclovir. Initial call taken by: Rudy Jew, RN,  June 02, 2010 3:07 PM  Follow-up for Phone Call        in this and oxycodone,are  the same thing don't take both Follow-up by: Roderick Pee MD,  June 02, 2010 4:09 PM  Additional Follow-up for Phone Call Additional follow up Details #1::        Phone Call Completed Additional Follow-up by: Rudy Jew, RN,  June 02, 2010 4:45 PM

## 2010-10-07 NOTE — Assessment & Plan Note (Signed)
Summary: cpx labs v70.0/pt stated medicare is not primary/ROA/FUP/RCD   Vital Signs:  Patient profile:   75 year old male Weight:      200 pounds Temp:     97.5 degrees F oral BP sitting:   110 / 70  (left arm) Cuff size:   regular  Vitals Entered By: Kern Reap CMA Duncan Dull) (June 06, 2010 8:47 AM) CC: follow-up visit   Primary Care Provider:  Kelle Darting, MD  CC:  follow-up visit.  History of Present Illness: [name] is a 75 year old, married male, nonsmoker, who comes in today for follow-up of shingles.   zoster involving his left sciatic nerve.  Is currently on 40 mg of prednisone a day, acyclovir 800 mg 3 times a day, and Percocet,  4 times a day.  He says his pain is now to down to a 3 to 4 however, he is numb, mid lower left foot, down to his ankle  Allergies: 1)  ! Keflex  Past History:  Past medical, surgical, family and social histories (including risk factors) reviewed for relevance to current acute and chronic problems.  Past Medical History: Reviewed history from 11/25/2009 and no changes required. Prostate cancer, hx of Hypertension hyperlipidemia Coronary artery disease ulcerative colitis GERD stent RCA, June 2009 Peripheral vascular disease Adenomatous Colon Polyps  Past Surgical History: Reviewed history from 11/25/2009 and no changes required. Prostatectomy  ?-PROSTATE CANCER Inguinal herniorrhaphy? X 5 LAST ONE IN 80'S Angioplasty/Stent x 2  Family History: Reviewed history from 11/25/2009 and no changes required. Family History of CAD Male 1st degree relative <60 Family History High cholesterol Family History Hypertension No FH of Colon Cancer: Father died MI age 65 Throat Cancer-Brother died age 25 MS-brother and son  Social History: Reviewed history from 11/25/2009 and no changes required. Retired  from [employer].  Now works at [employer] course Married Former Smoker Alcohol use-yes Drug use-no Regular  exercise-yes  Review of Systems      See HPI  Physical Exam  General:  Well-developed,well-nourished,in no acute distress; alert,appropriate and cooperative throughout examination Msk:  No deformity or scoliosis noted of thoracic or lumbar spine.   Pulses:  R and L carotid,radial,femoral,dorsalis pedis and posterior tibial pulses are full and equal bilaterally Extremities:  No clubbing, cyanosis, edema, or deformity noted with normal full range of motion of all joints.   Neurologic:  numbness mid, lower extremity to the ankle.  Muscle strength normal   Impression & Recommendations:  Problem # 1:  HERPES ZOSTER NOS (ICD-053.9) Assessment Unchanged  Complete Medication List: 1)  Crestor 20 Mg Tabs (Rosuvastatin calcium) .Marland Kitchen.. 1 once daily 2)  Zetia 10 Mg Tabs (Ezetimibe) .... Take 1 tablet by mouth every morning 3)  Aspirin 325 Mg Tabs (Aspirin) .Marland Kitchen.. 1 tablet by mouth once daily 4)  Plavix 75 Mg Tabs (Clopidogrel bisulfate) .... Take one tab once daily 5)  Exforge Hct 10-320-25 Mg Tabs (Amlodipine-valsartan-hctz) .... One tab once daily 6)  Pantoprazole Sodium 40 Mg Tbec (Pantoprazole sodium) .... Take on tab once daily 7)  Nitrostat 0.4 Mg Subl (Nitroglycerin) .Marland Kitchen.. 1 sl as needed angina 8)  Asacol Hd 800 Mg Tbec (Mesalamine) .... 2 tablets three times a day 9)  Prednisone 20 Mg Tabs (Prednisone) .... Uad 10)  Zovirax 800 Mg Tabs (Acyclovir) .... Take 1 tablet by mouth three times a day 11)  Percocet 10-650 Mg Tabs (Oxycodone-acetaminophen) .... Take 1 tablet by mouth four times a day prin  Other Orders: Venipuncture (41660)  Specimen Handling (16109) TLB-Lipid Panel (80061-LIPID) TLB-BMP (Basic Metabolic Panel-BMET) (80048-METABOL) TLB-CBC Platelet - w/Differential (85025-CBCD) TLB-Hepatic/Liver Function Pnl (80076-HEPATIC) TLB-TSH (Thyroid Stimulating Hormone) (84443-TSH) TLB-PSA (Prostate Specific Antigen) (84153-PSA) UA Dipstick w/o Micro (automated)  (81003)  Patient  Instructions: 1)  continue the prednisone, 40 mg daily. 2)  Continue the acyclovir 800 mg 3 times daily. 3)  Continue the Percocet, one every 4 to 6 hours as needed for pain. 4)  Return next Thursday for follow Prescriptions: PERCOCET 10-650 MG TABS (OXYCODONE-ACETAMINOPHEN) Take 1 tablet by mouth four times a day prin  #40 x 0   Entered and Authorized by:   Roderick Pee MD   Signed by:   Roderick Pee MD on 06/06/2010   Method used:   Print then Give to Patient   RxID:   [id number]   Laboratory Results   Urine Tests  Date/Time Received: June 06, 2010  Date/Time Reported: June 06, 2010   Routine Urinalysis   Color: yellow Appearance: Clear Glucose: negative   (Normal Range: Negative) Bilirubin: 1+   (Normal Range: Negative) Ketone: negative   (Normal Range: Negative) Spec. Gravity: >=1.030   (Normal Range: 1.003-1.035) Blood: negative   (Normal Range: Negative) pH: 5.0   (Normal Range: 5.0-8.0) Protein: negative   (Normal Range: Negative) Urobilinogen: 0.2   (Normal Range: 0-1) Nitrite: negative   (Normal Range: Negative) Leukocyte Esterace: negative   (Normal Range: Negative)    Comments: Kathrynn Speed CMA  June 06, 2010 9:44 AM

## 2010-10-07 NOTE — Progress Notes (Signed)
Summary: Pantoprazole approved until 02-22-2011  Phone Note Outgoing Call   Caller: Patient Action Taken: Information Sent Summary of Call: pantoprazole has been approved until 02-22-2011 per Kathlene November at [facility]. pt uses [employer] order. Initial call taken by: Warnell Forester,  February 27, 2010 10:33 AM

## 2010-10-07 NOTE — Letter (Signed)
Summary: [facility]  [facility]   Imported By: Lester [place]boro 12/31/2009 11:12:24  _____________________________________________________________________  External Attachment:    Type:   Image     Comment:   External Document

## 2010-10-07 NOTE — Progress Notes (Signed)
Summary: Refilled Pantoprazole  Phone Note Refill Request Message from:  Patient on February 20, 2010 9:59 AM  Refills Requested: Medication #1:  PANTOPRAZOLE SODIUM 40 MG TBEC take on tab once daily   Dosage confirmed as above?Dosage Confirmed Initial call taken by: Kathrynn Speed CMA,  February 20, 2010 9:59 AM    Prescriptions: PANTOPRAZOLE SODIUM 40 MG TBEC (PANTOPRAZOLE SODIUM) take on tab once daily  #100 x 3   Entered by:   Kathrynn Speed CMA   Authorized by:   Roderick Pee MD   Signed by:   Kathrynn Speed CMA on 02/20/2010   Method used:   Faxed to ...       [facility]* (retail)             ,          Ph: [phone]       Fax: [phone]   RxID:   [id number]

## 2010-10-07 NOTE — Letter (Signed)
Summary: [facility]  [facility]   Imported By: Bubba Hales 12/31/2009 11:25:02  _____________________________________________________________________  External Attachment:    Type:   Image     Comment:   External Document

## 2010-10-07 NOTE — Miscellaneous (Signed)
Summary: Physical Therapy Discharge Summary/[facility]  Physical Therapy Discharge Summary/[facility]   Imported By: Maryln Gottron 02/21/2010 09:39:30  _____________________________________________________________________  External Attachment:    Type:   Image     Comment:   External Document

## 2010-10-07 NOTE — Procedures (Signed)
Summary: Colonoscopy: Dr. Luther Parody: [place]   Colonoscopy  Procedure date:  06/26/2005  Findings:      Pathology:  Adenomatous polyp.        Location:  [facility].   Patient Name: Kristopher Wilson, Kristopher Wilson MRN: [id number] Procedure Procedures: Colonoscopy CPT: 50277.    with biopsy. CPT: Q5068410.  Personnel: Endoscopist: Roosvelt Harps, MD.  Referred By: Dellis Anes Heller, MD.  Exam Location: Exam performed in Endoscopy Suite. Outpatient  Patient Consent: Procedure, Alternatives, Risks and Benefits discussed, consent obtained, from patient. Consent was obtained by the RN. Consent to be contacted was not given.  Indications  Surveillance of: Ulcerative Colitis.  History  Current Medications: Patient is not currently taking Coumadin.  Patient Habits Patient does not smoke. Drinking Status: social.  Pre-Exam Physical: Cardio-pulmonary exam, Rectal exam, HEENT exam , Abdominal exam, Extremity exam, Neurological exam, Mental status exam WNL.  Exam Exam: Extent of exam reached: Cecum, extent intended: Cecum.  The cecum was identified by appendiceal orifice and IC valve. Patient position: left side to back. Colon retroflexion performed. Images taken. ASA Classification: II. Tolerance: excellent.  Monitoring: Pulse and BP monitoring, Oximetry used. Supplemental O2 given.  Colon Prep Used Miralax for colon prep. Prep results: excellent.  Fluoroscopy: Fluoroscopy was not used.  Sedation Meds: Patient assessed and found to be appropriate for moderate (conscious) sedation. Sedation was managed by the Endoscopist. Fentanyl 125 mcg. given IV. Versed 10 mg. given IV.  Findings OTHER FINDING: Descending Colon. Comments: ? nodule biopsied at 65 cm.  IMAGE TAKEN: Ascending Colon.  Image #2 attached.  Comments:  Normal.  IMAGE TAKEN: Cecum.  Image #1 attached.  Comments:  Normal.  IMAGE TAKEN: Sigmoid Colon.  Image #3 attached.  Comments:  Normal.  IMAGE TAKEN: Rectum.  Image  #4 attached.  Comments:  Normal.    Comments: Random biopsies done throughout Assessment Abnormal examination, see findings above.  Comments: UC in complete visual remission Few scattered tics Events  Unplanned Interventions: No intervention was required.  Unplanned Events: There were no complications. Plans  Post Exam Instructions: Post sedation instructions given.  Medication Plan: Continue current medications.  Disposition: After procedure patient sent to recovery. After recovery patient sent home.  Scheduling/Referral: Follow-Up prn. Await pathology to schedule patient.

## 2010-10-07 NOTE — Procedures (Signed)
Summary: Colonoscopy: Dr. Luther Parody: Colitis   Colonoscopy  Procedure date:  05/11/2003  Findings:      Results: Colitis.       Location:  [facility].   Patient Name: Kristopher Wilson, Kristopher Wilson MRN: [id number] Procedure Procedures: Colonoscopy CPT: 40981.    with biopsy. CPT: Q5068410.  Personnel: Endoscopist: Roosvelt Harps, MD.  Referred By: Dellis Anes Heller, MD.  Exam Location: Exam performed in Endoscopy Suite. Outpatient  Patient Consent: Procedure, Alternatives, Risks and Benefits discussed, consent obtained, from guardian. Consent was obtained by the physician. Consent to be contacted was not given.  Indications Symptoms: Hematochezia.  Surveillance of: Ulcerative Colitis.  History Patient Habits Patient does not smoke. Drinking Status: social.  Pre-Exam Physical: Cardio-pulmonary exam, Rectal exam, HEENT exam , Abdominal exam, Extremity exam, Neurological exam, Mental status exam WNL.  Exam Exam: Extent of exam reached: Cecum, extent intended: Cecum.  The cecum was identified by appendiceal orifice and IC valve. Patient position: on left side. Colon retroflexion performed. Images taken. ASA Classification: II. Tolerance: excellent.  Monitoring: Pulse and BP monitoring, Oximetry used. Supplemental O2 given.  Colon Prep Used Phospho Soda for colon prep. Prep results: good.  Fluoroscopy: Fluoroscopy was not used.  Sedation Meds: Patient assessed and found to be appropriate for anxiolytic sedation. Sedation was managed by the Endoscopist. Demerol 75 mg. given IV. Versed 9 mg. given IV.  Findings IMAGE TAKEN: Ascending Colon.  Image #2 attached.  Comments:  Normal.  IMAGE TAKEN: Cecum.  Image #1 attached.  Comments:  Normal.  IMAGE TAKEN: Sigmoid Colon.  Image #3 attached.  Comments:  Normal.  IMAGE TAKEN: Rectum.  Image #4 attached.  Comments:  Normal.    Comments: Random biopsies taken of 1)right colon and 2) 45 cm and below. Assessment Normal  examination.  Comments: Ulcerative colitis in complete visual remission. Events  Unplanned Interventions: No intervention was required.  Unplanned Events: There were no complications. Plans  Post Exam Instructions: Post sedation instructions given.  Medication Plan: Continue current medications.  Patient Education: Patient given standard instructions for: a normal exam. Patient instructed to get routine colonoscopy every 5 years.  Disposition: After procedure patient sent to recovery. After recovery patient sent home.  Scheduling/Referral: Follow-Up prn. Await pathology to schedule patient.

## 2010-10-07 NOTE — Assessment & Plan Note (Signed)
Summary: PAIN IN LFT LEG/RASH ON LEG,FOOT AND BUTTOCK/CJR   Vital Signs:  Patient profile:   75 year old male Weight:      204 pounds Temp:     97.4 degrees F oral BP sitting:   120 / 92  (right arm) Cuff size:   regular CC: Pain in Left leg, rash, on left leg,foot & buttock, src Is Patient Diabetic? No   Primary Care Provider:  Kelle Darting, MD  CC:  Pain in Left leg, rash, on left leg, foot & buttock, and src.  History of Present Illness: Kristopher Wilson is a 74 year old, married male, nonsmoker, who comes in today for evaluation of a skin rash on his left leg.  We saw him last week with low back pain x 3 days at that time.  His exam was otherwise negative.  We treated him symptomatically.  We saw him last Thursday.  It that time.  His pain was unchanged and he had a bug bite-type lesion on his left lateral malleolus.  He went to [place] over the weekend, and the rash broke out all over.  He went to a local emergency room and was told it was a drug reaction.  The rash now extends from his ankle up to his buttocks .  His pain is a 3 if he is lying still and 8 if he moves.  Four years ago.  He had the shingles vaccination  Preventive Screening-Counseling & Management  Alcohol-Tobacco     Smoking Status: quit     Year Quit: 30 YRS AGO  Current Medications (verified): 1)  Crestor 20 Mg  Tabs (Rosuvastatin Calcium) .Marland Kitchen.. 1 Once Daily 2)  Zetia 10 Mg  Tabs (Ezetimibe) .... Take 1 Tablet By Mouth Every Morning 3)  Aspirin 325 Mg Tabs (Aspirin) .Marland Kitchen.. 1 Tablet By Mouth Once Daily 4)  Plavix 75 Mg  Tabs (Clopidogrel Bisulfate) .... Take One Tab Once Daily 5)  Exforge Hct 10-320-25 Mg Tabs (Amlodipine-Valsartan-Hctz) .... One Tab Once Daily 6)  Pantoprazole Sodium 40 Mg Tbec (Pantoprazole Sodium) .... Take On Tab Once Daily 7)  Nitrostat 0.4 Mg Subl (Nitroglycerin) .Marland Kitchen.. 1 Sl As Needed Angina 8)  Asacol Hd 800 Mg Tbec (Mesalamine) .... 2 Tablets Three Times A Day 9)  Flexeril 5 Mg Tabs  (Cyclobenzaprine Hcl) .Marland Kitchen.. 1 Tab @ Bedtime 10)  Vicodin Es 7.5-750 Mg Tabs (Hydrocodone-Acetaminophen) .Marland Kitchen.. 1 Tab @ Bedtime  Allergies (verified): 1)  ! Keflex  Past History:  Past medical, surgical, family and social histories (including risk factors) reviewed for relevance to current acute and chronic problems.  Past Medical History: Reviewed history from 11/25/2009 and no changes required. Prostate cancer, hx of Hypertension hyperlipidemia Coronary artery disease ulcerative colitis GERD stent RCA, June 2009 Peripheral vascular disease Adenomatous Colon Polyps  Past Surgical History: Reviewed history from 11/25/2009 and no changes required. Prostatectomy  ?-PROSTATE CANCER Inguinal herniorrhaphy? X 5 LAST ONE IN 80'S Angioplasty/Stent x 2  Family History: Reviewed history from 11/25/2009 and no changes required. Family History of CAD Male 1st degree relative <60 Family History High cholesterol Family History Hypertension No FH of Colon Cancer: Father died MI age 68 Throat Cancer-Brother died age 62 MS-brother and son  Social History: Reviewed history from 11/25/2009 and no changes required. Retired  from [employer].  Now works at [employer] course Married Former Smoker Alcohol use-yes Drug use-no Regular exercise-yes  Review of Systems      See HPI  Physical Exam  Skin:  zoster  extending from the ankle to the buttocks   Problems:  Medical Problems Added: 1)  Dx of Herpes Zoster Nos  (ICD-053.9)  Impression & Recommendations:  Problem # 1:  HERPES ZOSTER NOS (ICD-053.9) Assessment New  Orders: Prescription Created Electronically [id number]) T-Culture, Wound ([id number])  Complete Medication List: 1)  Crestor 20 Mg Tabs (Rosuvastatin calcium) .Marland Kitchen.. 1 once daily 2)  Zetia 10 Mg Tabs (Ezetimibe) .... Take 1 tablet by mouth every morning 3)  Aspirin 325 Mg Tabs (Aspirin) .Marland Kitchen.. 1 tablet by mouth once daily 4)  Plavix 75 Mg Tabs  (Clopidogrel bisulfate) .... Take one tab once daily 5)  Exforge Hct 10-320-25 Mg Tabs (Amlodipine-valsartan-hctz) .... One tab once daily 6)  Pantoprazole Sodium 40 Mg Tbec (Pantoprazole sodium) .... Take on tab once daily 7)  Nitrostat 0.4 Mg Subl (Nitroglycerin) .Marland Kitchen.. 1 sl as needed angina 8)  Asacol Hd 800 Mg Tbec (Mesalamine) .... 2 tablets three times a day 9)  Prednisone 20 Mg Tabs (Prednisone) .... Uad 10)  Zovirax 800 Mg Tabs (Acyclovir) .... Take 1 tablet by mouth three times a day 11)  Percocet 10-650 Mg Tabs (Oxycodone-acetaminophen) .... Take 1 tablet by mouth four times a day prin  Patient Instructions: 1)   prednisone two tabs daily, acyclovir, 800 mg 3 times a day, oxycodone, one every 4 to 6 hours as needed for pain return on Friday for follow-up, sooner if any problems Prescriptions: PERCOCET 10-650 MG TABS (OXYCODONE-ACETAMINOPHEN) Take 1 tablet by mouth four times a day prin  #40 x 0   Entered and Authorized by:   Roderick Pee MD   Signed by:   Roderick Pee MD on 06/02/2010   Method used:   Print then Give to Patient   RxID:   [id number] ZOVIRAX 800 MG TABS (ACYCLOVIR) Take 1 tablet by mouth three times a day  #40 x 1   Entered and Authorized by:   Roderick Pee MD   Signed by:   Roderick Pee MD on 06/02/2010   Method used:   Electronically to        [facility]  [place] [address] (retail)       [address]       [place], [place]  [address]       Ph: [phone] or [phone]       Fax: [phone]   RxID:   [id number] PREDNISONE 20 MG TABS (PREDNISONE) UAD  #50 x 1   Entered and Authorized by:   Roderick Pee MD   Signed by:   Roderick Pee MD on 06/02/2010   Method used:   [facility]ically to        [facility]  [place] [address] (retail)       [address]       [place], [place]  [address]       Ph: [phone] or [phone]       Fax: [phone]   RxID:   [id number]

## 2010-10-07 NOTE — Letter (Signed)
Summary: [facility]  [facility]   Imported By: Bubba Hales 12/31/2009 11:17:26  _____________________________________________________________________  External Attachment:    Type:   Image     Comment:   External Document

## 2010-10-07 NOTE — Letter (Signed)
Summary: [facility]  [facility]   Imported By: Bubba Hales 12/31/2009 11:18:33  _____________________________________________________________________  External Attachment:    Type:   Image     Comment:   External Document

## 2010-10-07 NOTE — Assessment & Plan Note (Signed)
Summary: 1 wk rov/mm   Vital Signs:  Patient profile:   75 year old male Weight:      204 pounds Temp:     98.6 degrees F oral Pulse rate:   100 / minute Resp:     16 per minute BP sitting:   96 / 46  Vitals Entered By: Lynann Beaver CMA (June 19, 2010 10:06 AM) CC: rov Is Patient Diabetic? No Pain Assessment Patient in pain? no        Primary Care Provider:  Kelle Darting, MD  CC:  rov.  History of Present Illness: Kristopher Wilson is a 75 year old male, married, nonsmoker, who comes back today for follow-up of post shingles neuralgia.  He currently on Neurontin 300 mg daily, Percocet -- 650/10...Marland KitchenMarland KitchenMarland Kitchen 5 times a day.  Once he started the Neurontin.  He says his pain is down to a 3.  He's having constipation from the pain medication.  He is also continued to take the acyclovir 3 times a day and his taper down the prednisone to half a tablet every other day.  He scheduled to have cataract surgery.  Next Thursday.  Because of the persistence of the neuropathy.  I will request a neurologic consult to see if there is anything else we can do to help decrease his discomfort  Current Medications (verified): 1)  Crestor 20 Mg  Tabs (Rosuvastatin Calcium) .Marland Kitchen.. 1 Once Daily 2)  Zetia 10 Mg  Tabs (Ezetimibe) .... Take 1 Tablet By Mouth Every Morning 3)  Aspirin 325 Mg Tabs (Aspirin) .Marland Kitchen.. 1 Tablet By Mouth Once Daily 4)  Plavix 75 Mg  Tabs (Clopidogrel Bisulfate) .... Take One Tab Once Daily 5)  Exforge Hct 10-320-25 Mg Tabs (Amlodipine-Valsartan-Hctz) .... One Tab Once Daily 6)  Pantoprazole Sodium 40 Mg Tbec (Pantoprazole Sodium) .... Take On Tab Once Daily 7)  Nitrostat 0.4 Mg Subl (Nitroglycerin) .Marland Kitchen.. 1 Sl As Needed Angina 8)  Asacol Hd 800 Mg Tbec (Mesalamine) .... 2 Tablets Three Times A Day 9)  Prednisone 20 Mg Tabs (Prednisone) .... Uad 10)  Zovirax 800 Mg Tabs (Acyclovir) .... Take 1 Tablet By Mouth Three Times A Day 11)  Percocet 10-650 Mg Tabs (Oxycodone-Acetaminophen) .... Take 1  Tablet By Mouth 4  Times A Day 12)  Neurontin 300 Mg Caps (Gabapentin) .... Take 1 Tablet By Mouth Three Times A Day 13)  Motrin Ib 200 Mg Tabs (Ibuprofen) .... Take 1 Tablet By Mouth Two Times A Day  Allergies (verified): 1)  ! Keflex  Past History:  Past medical, surgical, family and social histories (including risk factors) reviewed for relevance to current acute and chronic problems.  Past Medical History: Reviewed history from 11/25/2009 and no changes required. Prostate cancer, hx of Hypertension hyperlipidemia Coronary artery disease ulcerative colitis GERD stent RCA, June 2009 Peripheral vascular disease Adenomatous Colon Polyps  Past Surgical History: Reviewed history from 06/16/2010 and no changes required. Prostatectomy  ?-PROSTATE CANCER Inguinal herniorrhaphy? X 5 LAST ONE IN 80'S Angioplasty/Stent x 2 Tonsillectomy  Family History: Reviewed history from 06/16/2010 and no changes required. Family History High cholesterol Family History Hypertension No FH of Colon Cancer: Father died MI age 38 Throat Cancer-Brother died age 58 MS-brother and son Family History of Heart Disease: Father Family History of Diabetes: Uncle  Social History: Reviewed history from 06/16/2010 and no changes required. Retired  from [employer].  Now works at [employer] course Married Former Smoker-stopped 35 years ago Alcohol use-yes-2 drinks per day Drug use-no Patient  does not get regular exercise.   Review of Systems      See HPI  Physical Exam  General:  Well-developed,well-nourished,in no acute distress; alert,appropriate and cooperative throughout examination Neurologic:  normal except for decreased light touch, needed.  Ankle, left lower extremity   Impression & Recommendations:  Problem # 1:  HERPES ZOSTER NOS (ICD-053.9) Assessment Unchanged  Orders: Neurology Referral (Neuro)  Complete Medication List: 1)  Crestor 20 Mg Tabs (Rosuvastatin  calcium) .Marland Kitchen.. 1 once daily 2)  Zetia 10 Mg Tabs (Ezetimibe) .... Take 1 tablet by mouth every morning 3)  Aspirin 325 Mg Tabs (Aspirin) .Marland Kitchen.. 1 tablet by mouth once daily 4)  Plavix 75 Mg Tabs (Clopidogrel bisulfate) .... Take one tab once daily 5)  Exforge Hct 10-320-25 Mg Tabs (Amlodipine-valsartan-hctz) .... One tab once daily 6)  Pantoprazole Sodium 40 Mg Tbec (Pantoprazole sodium) .... Take on tab once daily 7)  Nitrostat 0.4 Mg Subl (Nitroglycerin) .Marland Kitchen.. 1 sl as needed angina 8)  Asacol Hd 800 Mg Tbec (Mesalamine) .... 2 tablets three times a day 9)  Prednisone 20 Mg Tabs (Prednisone) .... Uad 10)  Zovirax 800 Mg Tabs (Acyclovir) .... Take 1 tablet by mouth three times a day 11)  Percocet 10-650 Mg Tabs (Oxycodone-acetaminophen) .... Take 1 tablet by mouth 4  times a day 12)  Neurontin 300 Mg Caps (Gabapentin) .... Take 1 tablet by mouth three times a day 13)  Motrin Ib 200 Mg Tabs (Ibuprofen) .... Take 1 tablet by mouth two times a day  Patient Instructions: 1)  continue the acyclovir 3 times a day, continue the Neurontin 3 times a day, decrease the Percocet to 4 day, take a half a prednisone tablet Saturday, and Monday, then stop the prednisone. 2)  We will also be to set up for a neurologic consult. 3)  Return in 3 weeks for follow-up Prescriptions: PERCOCET 10-650 MG TABS (OXYCODONE-ACETAMINOPHEN) Take 1 tablet by mouth 4  times a day  #100 x 0   Entered and Authorized by:   Roderick Pee MD   Signed by:   Roderick Pee MD on 06/19/2010   Method used:   Print then Give to Patient   RxID:   [id number] NITROSTAT 0.4 MG SUBL (NITROGLYCERIN) 1 SL as needed angina  #25 x 1   Entered and Authorized by:   Roderick Pee MD   Signed by:   Roderick Pee MD on 06/19/2010   Method used:   Faxed to ...       [facility] (mail-order)             , [place]         Ph: [phone]       Fax: [phone]   RxID:   [id number] EXFORGE HCT 10-320-25 MG TABS (AMLODIPINE-VALSARTAN-HCTZ) one  tab once daily  #100 Tablet x 3   Entered and Authorized by:   Roderick Pee MD   Signed by:   Roderick Pee MD on 06/19/2010   Method used:   Faxed to ...       [facility] (mail-order)             , [place]         Ph: [phone]       Fax: [phone]   RxID:   [id number] PLAVIX 75 MG  TABS (CLOPIDOGREL BISULFATE) take one tab once daily  #100 x 3   Entered and Authorized by:   Roderick Pee MD  Signed by:   Roderick Pee MD on 06/19/2010   Method used:   Faxed to ...       [facility] (mail-order)             , [place]         Ph: [phone]       Fax: [phone]   RxID:   [id number] ZETIA 10 MG  TABS (EZETIMIBE) Take 1 tablet by mouth every morning  #100 x 3   Entered and Authorized by:   Roderick Pee MD   Signed by:   Roderick Pee MD on 06/19/2010   Method used:   Faxed to ...       [facility] (mail-order)             , [place]         Ph: [phone]       Fax: [phone]   RxID:   [id number] CRESTOR 20 MG  TABS (ROSUVASTATIN CALCIUM) 1 once daily  #100 x 3   Entered and Authorized by:   Roderick Pee MD   Signed by:   Roderick Pee MD on 06/19/2010   Method used:   Faxed to ...       [facility] (mail-order)             , [place]         Ph: [phone]       Fax: [phone]   RxID:   [id number]

## 2010-10-07 NOTE — Assessment & Plan Note (Signed)
Summary: lower back pain/periodic sharp pain when taking step/cjr   Vital Signs:  Patient profile:   75 year old male Weight:      206 pounds Temp:     97.6 degrees F oral BP sitting:   124 / 80  (left arm) Cuff size:   regular  Vitals Entered By: Kern Reap CMA Duncan Dull) (Jan 22, 2010 9:13 AM) CC: lower left hip and back pain   Primary Care Provider:  Kelle Darting, MD  CC:  lower left hip and back pain.  History of Present Illness: Kristopher Wilson is a 75 year old, married male, retired, who works at [employer] course.  The comes in today for evaluation of back pain.  About 5 months ago he began having a traumatic left low back pain.  He describes as sometimes dull.  It comes and goes.  It lasts for a day or two or 3 and then go away.  Most recently the pain is changed and, now he's having some sharp pain, and he points to his left hip as the source of the pain.  He says that pain is about an 8 on a scale of one to 10.  The pain does not radiate.  It does not wake him up at night.  No history of trauma.  No bowel nor bladder dysfunction.  Neurologic review of systems negative.  Allergies: 1)  ! Keflex  Past History:  Past medical, surgical, family and social histories (including risk factors) reviewed for relevance to current acute and chronic problems.  Past Medical History: Reviewed history from 11/25/2009 and no changes required. Prostate cancer, hx of Hypertension hyperlipidemia Coronary artery disease ulcerative colitis GERD stent RCA, June 2009 Peripheral vascular disease Adenomatous Colon Polyps  Past Surgical History: Reviewed history from 11/25/2009 and no changes required. Prostatectomy  ?-PROSTATE CANCER Inguinal herniorrhaphy? X 5 LAST ONE IN 80'S Angioplasty/Stent x 2  Family History: Reviewed history from 11/25/2009 and no changes required. Family History of CAD Male 1st degree relative <60 Family History High cholesterol Family History  Hypertension No FH of Colon Cancer: Father died MI age 7 Throat Cancer-Brother died age 91 MS-brother and son  Social History: Reviewed history from 11/25/2009 and no changes required. Retired  from [employer].  Now works at [employer] course Married Former Smoker Alcohol use-yes Drug use-no Regular exercise-yes  Review of Systems      See HPI  Physical Exam  General:  Well-developed,well-nourished,in no acute distress; alert,appropriate and cooperative throughout examination Msk:  No deformity or scoliosis noted of thoracic or lumbar spine.  ........straight leg raising right leg only 30 degrees because of the tight hamstrings.  Straight leg raising left elicits pain in the left hip Pulses:  R and L carotid,radial,femoral,dorsalis pedis and posterior tibial pulses are full and equal bilaterally Extremities:  No clubbing, cyanosis, edema, or deformity noted with normal full range of motion of all joints.   Neurologic:  No cranial nerve deficits noted. Station and gait are normal. Plantar reflexes are down-going bilaterally. DTRs are symmetrical throughout. Sensory, motor and coordinative functions appear intact.   Impression & Recommendations:  Problem # 1:  BACK PAIN, LUMBAR (ICD-724.2) Assessment Deteriorated  His updated medication list for this problem includes:    Aspirin 325 Mg Tabs (Aspirin) .Marland Kitchen... 1 tablet by mouth once daily    Flexeril 5 Mg Tabs (Cyclobenzaprine hcl) .Marland Kitchen... 1 tab @ bedtime    Vicodin Es 7.5-750 Mg Tabs (Hydrocodone-acetaminophen) .Marland Kitchen... 1 tab @ bedtime  Orders: T-Bilateral Hip w/Pelvis, min 2 views (73520TC) T-Lumbar Spine w/Flex & Ext 4 Views (16109UE)  Complete Medication List: 1)  Crestor 20 Mg Tabs (Rosuvastatin calcium) .Marland Kitchen.. 1 once daily 2)  Zetia 10 Mg Tabs (Ezetimibe) .... Take 1 tablet by mouth every morning 3)  Aspirin 325 Mg Tabs (Aspirin) .Marland Kitchen.. 1 tablet by mouth once daily 4)  Plavix 75 Mg Tabs (Clopidogrel bisulfate)  .... Take one tab once daily 5)  Exforge Hct 10-320-25 Mg Tabs (Amlodipine-valsartan-hctz) .... One tab once daily 6)  Pantoprazole Sodium 40 Mg Tbec (Pantoprazole sodium) .... Take on tab once daily 7)  Nitrostat 0.4 Mg Subl (Nitroglycerin) .Marland Kitchen.. 1 sl as needed angina 8)  Asacol Hd 800 Mg Tbec (Mesalamine) .... 2 tablets three times a day 9)  Flexeril 5 Mg Tabs (Cyclobenzaprine hcl) .Marland Kitchen.. 1 tab @ bedtime 10)  Vicodin Es 7.5-750 Mg Tabs (Hydrocodone-acetaminophen) .Marland Kitchen.. 1 tab @ bedtime  Patient Instructions: 1)  go to the main office now for your x-rays.  Take Motrin 600 mg twice a day with food and one half Flexeril and/or Vicodin at bedtime as needed for pain. 2)  I will call you when I get your x-ray report.  Back Prescriptions: VICODIN ES 7.5-750 MG TABS (HYDROCODONE-ACETAMINOPHEN) 1 tab @ bedtime  #30 x 2   Entered and Authorized by:   Roderick Pee MD   Signed by:   Roderick Pee MD on 01/22/2010   Method used:   Print then Give to Patient   RxID:   [id number] FLEXERIL 5 MG TABS (CYCLOBENZAPRINE HCL) 1 tab @ bedtime  #30 x 2   Entered and Authorized by:   Roderick Pee MD   Signed by:   Roderick Pee MD on 01/22/2010   Method used:   Print then Give to Patient   RxID:   [id number]

## 2010-10-07 NOTE — Letter (Signed)
Summary: [facility]  [facility]   Imported By: Lester Neahkahnie 12/31/2009 11:07:25  _____________________________________________________________________  External Attachment:    Type:   Image     Comment:   External Document

## 2010-10-07 NOTE — Assessment & Plan Note (Signed)
Summary: HIP PAIN//CCM   Vital Signs:  Patient profile:   75 year old male Weight:      210 pounds Temp:     97.9 degrees F oral BP sitting:   110 / 64  (left arm) Cuff size:   regular  Vitals Entered By: Kern Reap CMA Duncan Dull) (May 26, 2010 12:37 PM) CC: left hip and leg pain Is Patient Diabetic? No Pain Assessment Patient in pain? yes     Location: hip Intensity: 7 Type: sharp, aches Onset of pain  Sudden   Primary Care Provider:  Kelle Darting, MD  CC:  left hip and leg pain.  History of Present Illness: Kristopher Wilson is a 75 year old, married male, nonsmoker, who comes in today for evaluation of back pain.  Approximately 3 a.m. on Saturday morning he awoke with severe left low back pain.  He describes the pain as constant.  It sometimes sharp sometimes dull.  A scale of one to 10.  This is 7.  It tends to radiate down his left leg to his knee and no further.  He denies any neurologic symptoms.  No bowel nor bladder dysfunction.  He's had problems like this in the past.  He one time saw Dr. Idell Pickles and was told he had sciatica.  It flares up every now and then.  It's always resolved with just rest at home.  However, this weekend.  He is to play golf in [place]  Allergies: 1)  ! Keflex  Past History:  Past medical, surgical, family and social histories (including risk factors) reviewed for relevance to current acute and chronic problems.  Past Medical History: Reviewed history from 11/25/2009 and no changes required. Prostate cancer, hx of Hypertension hyperlipidemia Coronary artery disease ulcerative colitis GERD stent RCA, June 2009 Peripheral vascular disease Adenomatous Colon Polyps  Past Surgical History: Reviewed history from 11/25/2009 and no changes required. Prostatectomy  ?-PROSTATE CANCER Inguinal herniorrhaphy? X 5 LAST ONE IN 80'S Angioplasty/Stent x 2  Family History: Reviewed history from 11/25/2009 and no changes required. Family History of  CAD Male 1st degree relative <60 Family History High cholesterol Family History Hypertension No FH of Colon Cancer: Father died MI age 78 Throat Cancer-Brother died age 67 MS-brother and son  Social History: Reviewed history from 11/25/2009 and no changes required. Retired  from [employer].  Now works at [employer] course Married Former Smoker Alcohol use-yes Drug use-no Regular exercise-yes  Review of Systems      See HPI  Physical Exam  General:  Well-developed,well-nourished,in no acute distress; alert,appropriate and cooperative throughout examination Head:  Normocephalic and atraumatic without obvious abnormalities. No apparent alopecia or balding. Msk:  No deformity or scoliosis noted of thoracic or lumbar spine.   Pulses:  R and L carotid,radial,femoral,dorsalis pedis and posterior tibial pulses are full and equal bilaterally Extremities:  No clubbing, cyanosis, edema, or deformity noted with normal full range of motion of all joints.   Neurologic:  No cranial nerve deficits noted. Station and gait are normal. Plantar reflexes are down-going bilaterally. DTRs are symmetrical throughout. Sensory, motor and coordinative functions appear intact.   Impression & Recommendations:  Problem # 1:  BACK PAIN, LUMBAR (ICD-724.2) Assessment Deteriorated  His updated medication list for this problem includes:    Aspirin 325 Mg Tabs (Aspirin) .Marland Kitchen... 1 tablet by mouth once daily    Flexeril 5 Mg Tabs (Cyclobenzaprine hcl) .Marland Kitchen... 1 tab @ bedtime    Vicodin Es 7.5-750 Mg Tabs (Hydrocodone-acetaminophen) .Marland Kitchen... 1 tab @  bedtime  Complete Medication List: 1)  Crestor 20 Mg Tabs (Rosuvastatin calcium) .Marland Kitchen.. 1 once daily 2)  Zetia 10 Mg Tabs (Ezetimibe) .... Take 1 tablet by mouth every morning 3)  Aspirin 325 Mg Tabs (Aspirin) .Marland Kitchen.. 1 tablet by mouth once daily 4)  Plavix 75 Mg Tabs (Clopidogrel bisulfate) .... Take one tab once daily 5)  Exforge Hct 10-320-25 Mg Tabs  (Amlodipine-valsartan-hctz) .... One tab once daily 6)  Pantoprazole Sodium 40 Mg Tbec (Pantoprazole sodium) .... Take on tab once daily 7)  Nitrostat 0.4 Mg Subl (Nitroglycerin) .Marland Kitchen.. 1 sl as needed angina 8)  Asacol Hd 800 Mg Tbec (Mesalamine) .... 2 tablets three times a day 9)  Flexeril 5 Mg Tabs (Cyclobenzaprine hcl) .Marland Kitchen.. 1 tab @ bedtime 10)  Vicodin Es 7.5-750 Mg Tabs (Hydrocodone-acetaminophen) .Marland Kitchen.. 1 tab @ bedtime  Patient Instructions: 1)  stay at bed rest at home today and tomorrow.  Take a half of Flexeril and Vicodin 3 times a day, why you're at bedrest.  Wednesday get up and move around and take a half of Flexeril and Vicodin just at bedtime.  Return p.r.n. Prescriptions: VICODIN ES 7.5-750 MG TABS (HYDROCODONE-ACETAMINOPHEN) 1 tab @ bedtime  #30 x 2   Entered and Authorized by:   Roderick Pee MD   Signed by:   Roderick Pee MD on 05/26/2010   Method used:   Print then Give to Patient   RxID:   [id number] FLEXERIL 5 MG TABS (CYCLOBENZAPRINE HCL) 1 tab @ bedtime  #30 x 2   Entered and Authorized by:   Roderick Pee MD   Signed by:   Roderick Pee MD on 05/26/2010   Method used:   Print then Give to Patient   RxID:   [id number]

## 2010-10-07 NOTE — Letter (Signed)
Summary: [facility]  [facility]   Imported By: Bubba Hales 12/31/2009 11:14:01  _____________________________________________________________________  External Attachment:    Type:   Image     Comment:   External Document

## 2010-10-07 NOTE — Assessment & Plan Note (Signed)
Summary: ULCERATIVE COLITIS ABD PAIN/YF   History of Present Illness Visit Type: Initial Consult Primary GI MD: Silvano Rusk MD [facility] Primary Provider: Stevie Kern, MD Requesting Provider: Stevie Kern, MD Chief Complaint: Abdominal Pain and Ulcerative Colitis follow-up/switch GI providers History of Present Illness:   75 yo white man with ulcerative colitis for about 10 years. He switched to primary care to [facility] after [facility] retirment  and asks that he have MD's in same practice if possible.  Stools soft, sometime formed and sometimes not 2-3 bms in AM's at times  this is a problem when he has to go multiple times rare intermittet pinkish blood in water and some on stool rarae dark, loose stools smoetines only 4/day Asacol  UC dxed abut 2000 or 98 99 Dr. Vladimir Faster then  Dr. Michail Sermon followed him.  reflux, initiaally Nexium, no active sxs All other GI ROS negative.             Current Medications (verified): 1)  Crestor 20 Mg  Tabs (Rosuvastatin Calcium) .Marland Kitchen.. 1 Once Daily 2)  Zetia 10 Mg  Tabs (Ezetimibe) .... Take 1 Tablet By Mouth Every Morning 3)  Aspirin 325 Mg Tabs (Aspirin) .Marland Kitchen.. 1 Tablet By Mouth Once Daily 4)  Plavix 75 Mg  Tabs (Clopidogrel Bisulfate) .... Take One Tab Once Daily 5)  Exforge Hct 10-320-25 Mg Tabs (Amlodipine-Valsartan-Hctz) .... One Tab Once Daily 6)  Pantoprazole Sodium 40 Mg Tbec (Pantoprazole Sodium) .... Take On Tab Once Daily 7)  Nitrostat 0.4 Mg Subl (Nitroglycerin) .Marland Kitchen.. 1 Sl As Needed Angina 8)  Asacol Hd 800 Mg Tbec (Mesalamine) .... 2 Tablets Three Times A Day  Allergies (verified): 1)  ! Keflex  Past History:  Past Medical History: Prostate cancer, hx of Hypertension hyperlipidemia Coronary artery disease ulcerative colitis GERD stent RCA, June 2009 Peripheral vascular disease Adenomatous Colon Polyps  Past Surgical History: Prostatectomy  ?-PROSTATE CANCER Inguinal herniorrhaphy? X 5 LAST ONE IN  80'S Angioplasty/Stent x 2  Family History: Reviewed history from 04/18/2008 and no changes required. Family History of CAD Male 1st degree relative <60 Family History High cholesterol Family History Hypertension No FH of Colon Cancer: Father died MI age 70 Throat Cancer-Brother died age 55 MS-brother and son  Social History: Retired  from [employer].  Now works at [employer] course Married Former Smoker Alcohol use-yes Drug use-no Regular exercise-yes  Review of Systems       The patient complains of back pain, hearing problems, night sweats, and urine leakage.         All other ROS negative except as per HPI.   Vital Signs:  Patient profile:   75 year old male Height:      73.75 inches Weight:      204 pounds BMI:     26.47 Pulse rate:   80 / minute Pulse rhythm:   regular BP sitting:   126 / 70  (left arm)  Vitals Entered By: Randye Lobo NCMA (November 25, 2009 8:44 AM)  Physical Exam  General:  Well developed, well nourished, no acute distress. Eyes:   no icterus. Mouth:  Oral mucosa and oropharynx without lesions or exudates.  Teeth in good repair. Neck:  Supple; no masses or thyromegaly. Lungs:  Clear throughout to auscultation. Heart:  Regular rate and rhythm; no murmurs, rubs,  or bruits. Abdomen:  Soft, nontender and nondistended. No masses, hepatosplenomegaly or hernias noted. Normal bowel sounds. Rectal:  inspected - tags Extremities:  no edema  Neurologic:  Alert and  oriented x4;  Cervical Nodes:  No significant cervical or supraclavicular adenopathy.  Psych:  Alert and cooperative. Normal mood and affect.   Impression & Recommendations:  Problem # 1:  COLITIS, ULCERATIVE (ICD-556.9) Assessment New He has had this since about 2000. We have a records deficit at this time and will request his records from [facility] so that I may review. He seems a bit symptomatic which may be related to his incomplete compliance with his ACE occult  dose. He is missing the middle dose at times only taking for a day. I have advised the split in the b.i.d. said he gets 4.8 g a day we'll see what that does.  Problem # 2:  GERD (ICD-530.81) Assessment: Comment Only New to me. He seems fine on his current PPI regimen. We'll continue that. See medication list.  Patient Instructions: 1)  Please remember to take all 6 Asacol everyday. 2)  We will call you with further follow up recommendations after receiving and reviewing your records from [place]. 3)  Copy sent to : Stevie Kern, MD 4)  The medication list was reviewed and reconciled.  All changed / newly prescribed medications were explained.  A complete medication list was provided to the patient / caregiver. Prescriptions: ASACOL HD 800 MG TBEC (MESALAMINE) 2 tablets three times a day  #0540 x 2   Entered and Authorized by:   Gatha Mayer MD, [facility]   Signed by:   Gatha Mayer MD, FACG on 11/25/2009   Method used:   Electronically to        [facility] (mail-order)             ,          Ph: [phone]       Fax: [phone]   RxID:   [id number]   Appended Document: ULCERATIVE COLITIS ABD PAIN/YF   Colonoscopy  Procedure date:  06/06/2009  Findings:      Flat polyp 5 mm removed transverse - adenoma mild sigmoid diverticulosis normal mucosa - right, left, rectal bxs without active colitis  Procedures Next Due Date:    Colonoscopy: 06/2011   Let him know I have been able to review his records and I think he should have a repeat colonoscopy in 06/2011 to follow-up colitis and adenoma I would like to see him in about 5-6 months (before end of 2011) to follow-up, sooner prn    Appended Document: ULCERATIVE COLITIS ABD PAIN/YF recall in IDX and EMR  for colon and ROV

## 2010-10-07 NOTE — Assessment & Plan Note (Signed)
Summary: 17M FU/YF   History of Present Illness Visit Type: Follow-up Visit Primary GI MD: Stan Head MD [facility] Primary Provider: Kelle Darting, MD Requesting Provider: Kelle Darting, MD Chief Complaint: Patient here for 6 month f/u ulcerative colitis. His only complaint today is of constipation. History of Present Illness:   75 yo wm with left-sided ulcerative colitis. Diagnosed at about 2000. Second visit with me, changed from [facility] to [place] for care after PCP retired. Recently treated for  shingles and some of the medications have side effects of constipation. It is a bit ore difficult to defecate with a litte straining. No other bowel sxs, no GERD sxs reported.   GI Review of Systems      Denies abdominal pain, acid reflux, belching, bloating, chest pain, dysphagia with liquids, dysphagia with solids, heartburn, loss of appetite, nausea, vomiting, vomiting blood, weight loss, and  weight gain.      Reports constipation.     Denies anal fissure, black tarry stools, change in bowel habit, diarrhea, diverticulosis, fecal incontinence, heme positive stool, hemorrhoids, irritable bowel syndrome, jaundice, light color stool, liver problems, rectal bleeding, and  rectal pain. Preventive Screening-Counseling & Management  Caffeine-Diet-Exercise     Does Patient Exercise: no    Clinical Reports Reviewed:  Colonoscopy:  06/06/2009:  Flat polyp 5 mm removed transverse - adenoma mild sigmoid diverticulosis normal mucosa - right, left, rectal bxs without active colitis  06/26/2005:  Pathology:  Adenomatous polyp.        Location:  [facility].   05/11/2003:  Results: Colitis.       Location:  [facility].    Current Medications (verified): 1)  Crestor 20 Mg  Tabs (Rosuvastatin Calcium) .Marland Kitchen.. 1 Once Daily 2)  Zetia 10 Mg  Tabs (Ezetimibe) .... Take 1 Tablet By Mouth Every Morning 3)  Aspirin 325 Mg Tabs (Aspirin) .Marland Kitchen.. 1 Tablet By Mouth Once Daily 4)  Plavix 75  Mg  Tabs (Clopidogrel Bisulfate) .... Take One Tab Once Daily 5)  Exforge Hct 10-320-25 Mg Tabs (Amlodipine-Valsartan-Hctz) .... One Tab Once Daily 6)  Pantoprazole Sodium 40 Mg Tbec (Pantoprazole Sodium) .... Take On Tab Once Daily 7)  Nitrostat 0.4 Mg Subl (Nitroglycerin) .Marland Kitchen.. 1 Sl As Needed Angina 8)  Asacol Hd 800 Mg Tbec (Mesalamine) .... 2 Tablets Three Times A Day 9)  Prednisone 20 Mg Tabs (Prednisone) .... Uad 10)  Zovirax 800 Mg Tabs (Acyclovir) .... Take 1 Tablet By Mouth Three Times A Day 11)  Percocet 10-650 Mg Tabs (Oxycodone-Acetaminophen) .... Take 1 Tablet By Mouth 4  Times A Day 12)  Neurontin 300 Mg Caps (Gabapentin) .... Take 1 Tablet By Mouth Three Times A Day 13)  Motrin Ib 200 Mg Tabs (Ibuprofen) .... Take 1 Tablet By Mouth Two Times A Day  Allergies (verified): 1)  ! Keflex  Past History:  Past Medical History: Reviewed history from 11/25/2009 and no changes required. Prostate cancer, hx of Hypertension hyperlipidemia Coronary artery disease ulcerative colitis GERD stent RCA, June 2009 Peripheral vascular disease Adenomatous Colon Polyps  Past Surgical History: Prostatectomy  ?-PROSTATE CANCER Inguinal herniorrhaphy? X 5 LAST ONE IN 80'S Angioplasty/Stent x 2 Tonsillectomy  Family History: Family History High cholesterol Family History Hypertension No FH of Colon Cancer: Father died MI age 40 Throat Cancer-Brother died age 40 MS-brother and son Family History of Heart Disease: Father Family History of Diabetes: Uncle  Social History: Retired  from [employer].  Now works at [employer] course Married Former  Smoker-stopped 35 years ago Alcohol use-yes-2 drinks per day Drug use-no Patient does not get regular exercise.  Does Patient Exercise:  no  Review of Systems       left LE numbness, moreso than pain. zoster rash is resolving  Vital Signs:  Patient profile:   75 year old male Height:      74 inches Weight:       203.13 pounds BMI:     26.17 BSA:     2.19 Pulse rate:   76 / minute Pulse rhythm:   regular BP sitting:   108 / 58  (right arm)  Vitals Entered By: Lamona Curl CMA Duncan Dull) (June 16, 2010 10:47 AM)  Physical Exam  General:  Well developed, well nourished, no acute distress. Lungs:  Clear throughout to auscultation. Heart:  Regular rate and rhythm; no murmurs, rubs,  or bruits. Abdomen:  soft and nontender   Impression & Recommendations:  Problem # 1:  ULCERATIVE COLITIS, LEFT SIDED (ICD-556.5) Assessment Unchanged He has had this since about 2000. No dysplasia on biopsies in past. 2010 colonoscopy no endoscopic or microscopic colitis. Did have a 5 mm adenoma in right colon, outside of colitis area. On 4.8 g Asacol daily and no symptoms of colitis now. Mild constipation attributed to meds for shingles. Continue currebt tx. See me routinely in 1 year. Next surveillance/screening colonoscopy 05/2012 - 3 yrs since last (no colitis on last one)  Problem # 2:  GERD (ICD-530.81) Assessment: Unchanged continue PPI  Problem # 3:  COLONIC POLYPS, ADENOMATOUS, HX OF (ICD-V12.72) Assessment: Unchanged 5 mm adenoma in 2010 and has had in past also - 3mm adenoma 2006 these have been outside of colon involved with IBD so suspect these are sporadic adenomas repeat surveillance/screening 05/2012  Patient Instructions: 1)  Please continue current medications. Asacol has been refilled for 1 year and so has pantoprazole. 2)  Please schedule a follow-up appointment in 1 year. 3)  Please schedule a follow-up appointment as needed for problems with your ulcerative coltis or GERD. 4)  Next routine colonoscopy 05/2012. 5)  The medication list was reviewed and reconciled.  All changed / newly prescribed medications were explained.  A complete medication list was provided to the patient / caregiver. Prescriptions: ASACOL HD 800 MG TBEC (MESALAMINE) 2 tablets three times a day  #540 x 3    Entered by:   Francee Piccolo CMA (AAMA)   Authorized by:   Iva Boop MD, [facility]   Signed by:   Francee Piccolo CMA (AAMA) on 06/16/2010   Method used:   Faxed to ...       [facility] (mail-order)             , [place]         Ph: [phone]       Fax: [phone]   RxID:   [id number] PANTOPRAZOLE SODIUM 40 MG TBEC (PANTOPRAZOLE SODIUM) take on tab once daily  #100 x 3   Entered by:   Francee Piccolo CMA (AAMA)   Authorized by:   Iva Boop MD, [facility]   Signed by:   Francee Piccolo CMA (AAMA) on 06/16/2010   Method used:   Faxed to ...       [facility] (mail-order)             , [place]         Ph: [phone]       Fax: [phone]   RxID:   [id number]

## 2010-10-07 NOTE — Assessment & Plan Note (Signed)
Summary: SHINGLES ON LEG NO BETTER//SLM   Vital Signs:  Patient profile:   75 year old male Weight:      205 pounds Temp:     97.8 degrees F BP sitting:   104 / 78  (left arm) Cuff size:   regular  Vitals Entered By: Kern Reap CMA Duncan Dull) (July 28, 2010 11:08 AM) CC: follow-up visit   Primary Care Provider:  Kelle Darting, MD  CC:  follow-up visit.  History of Present Illness: [name]  is a 74 year old male comes in today for evaluation of swelling of his legs and x 2 weeks since he went to [place] in follow-up of his post-shingles neuralgia.  He is currently being evaluated by the neurologist.  They recommend he increase the Neurontin, which we would do and take two tabs 3 times a day.  He's been off the prednisone for 5 weeks therefore, we can go ahead and give him his flu shot.  He is also stopped the acyclovir.  He continues to require Percocet 10 mg 4 times daily to control the pain or  Allergies: 1)  ! Keflex  Past History:  Past medical, surgical, family and social histories (including risk factors) reviewed for relevance to current acute and chronic problems.  Past Medical History: Reviewed history from 11/25/2009 and no changes required. Prostate cancer, hx of Hypertension hyperlipidemia Coronary artery disease ulcerative colitis GERD stent RCA, June 2009 Peripheral vascular disease Adenomatous Colon Polyps  Past Surgical History: Reviewed history from 06/16/2010 and no changes required. Prostatectomy  ?-PROSTATE CANCER Inguinal herniorrhaphy? X 5 LAST ONE IN 80'S Angioplasty/Stent x 2 Tonsillectomy  Family History: Reviewed history from 06/16/2010 and no changes required. Family History High cholesterol Family History Hypertension No FH of Colon Cancer: Father died MI age 93 Throat Cancer-Brother died age 27 MS-brother and son Family History of Heart Disease: Father Family History of Diabetes: Uncle  Social History: Reviewed history from  06/16/2010 and no changes required. Retired  from [employer].  Now works at [employer] course Married Former Smoker-stopped 35 years ago Alcohol use-yes-2 drinks per day Drug use-no Patient does not get regular exercise.   Review of Systems      See HPI  Physical Exam  General:  Well-developed,well-nourished,in no acute distress; alert,appropriate and cooperative throughout examination Extremities:  1+ left pedal edema and 1+ right pedal edema.     Impression & Recommendations:  Problem # 1:  POSTHERPETIC NEURALGIA (ICD-053.19) Assessment Unchanged  Problem # 2:  VENOUS INSUFFICIENCY (ICD-459.81) Assessment: New  Complete Medication List: 1)  Crestor 20 Mg Tabs (Rosuvastatin calcium) .Marland Kitchen.. 1 once daily 2)  Zetia 10 Mg Tabs (Ezetimibe) .... Take 1 tablet by mouth every morning 3)  Aspirin 325 Mg Tabs (Aspirin) .Marland Kitchen.. 1 tablet by mouth once daily 4)  Plavix 75 Mg Tabs (Clopidogrel bisulfate) .... Take one tab once daily 5)  Exforge Hct 10-320-25 Mg Tabs (Amlodipine-valsartan-hctz) .... One tab once daily 6)  Pantoprazole Sodium 40 Mg Tbec (Pantoprazole sodium) .... Take on tab once daily 7)  Nitrostat 0.4 Mg Subl (Nitroglycerin) .Marland Kitchen.. 1 sl as needed angina 8)  Asacol Hd 800 Mg Tbec (Mesalamine) .... 2 tablets three times a day 9)  Percocet 10-650 Mg Tabs (Oxycodone-acetaminophen) .... Take 1 tablet by mouth q 4h 10)  Neurontin 300 Mg Caps (Gabapentin) .... Take 2  tablet by mouth 3 x  times a day 11)  Motrin Ib 200 Mg Tabs (Ibuprofen) .... Take 1 tablet by mouth two times  a day 12)  Lasix 20 Mg Tabs (Furosemide) .... Take 1 tablet by mouth every morning as needed  Patient Instructions: 1)  stay on a complete salt free diet. 2)  Take 20 mg of Lasix every morning until the swelling goes away. 3)  Remember to wear only loose golf socks, and no elastic socks around y  legs 4)  Please schedule a follow-up appointment as needed. Prescriptions: NEURONTIN 300 MG CAPS  (GABAPENTIN) Take 2  tablet by mouth 3 x  times a day  #600 x 3   Entered and Authorized by:   Roderick Pee MD   Signed by:   Roderick Pee MD on 07/28/2010   Method used:   Electronically to        [facility]  [place] [address] (retail)       [address]       [place], [place]  [address]       Ph: [phone] or [phone]       Fax: [phone]   RxID:   [id number] LASIX 20 MG TABS (FUROSEMIDE) Take 1 tablet by mouth every morning as needed  #30 x 2   Entered and Authorized by:   Roderick Pee MD   Signed by:   Roderick Pee MD on 07/28/2010   Method used:   Electronically to        [facility]  [place] [address] (retail)       [address]       [place], [place]  [address]       Ph: [phone] or [phone]       Fax: [phone]   RxID:   [id number]    Orders Added: 1)  Est. Patient Level III [25427]

## 2010-10-07 NOTE — Letter (Signed)
Summary: [facility]  [facility]   Imported By: Bubba Hales 12/31/2009 11:20:40  _____________________________________________________________________  External Attachment:    Type:   Image     Comment:   External Document

## 2010-10-07 NOTE — Letter (Signed)
Summary: [facility]  [facility]   Imported By: Bubba Hales 12/31/2009 11:15:02  _____________________________________________________________________  External Attachment:    Type:   Image     Comment:   External Document

## 2010-10-07 NOTE — Progress Notes (Signed)
  Phone Note Outgoing Call   Summary of Call: I called Kristopher Wilson to review the report of his x-rays of the spine and hips.  There are some degenerative changes.  No evidence of any tumors.  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  Patient will call the first week in August to set up for follow-up x-ray

## 2010-10-07 NOTE — Letter (Signed)
Summary: Office Visit Letter  [facility]  [address] [place], [place] [address]   Phone: [phone]  Fax: [phone]      June 12, 2010 MRN: [id number]   MAXAMILIAN AMADON [address] [place], [place]  [address]   Dear Mr. Bevill,   According to our records, it is time for you to schedule a follow-up office visit with [place].   At your convenience, please call [phone] (option #[phone])to schedule an office visit. If you have any questions, concerns, or feel that this letter is in error, we would appreciate your call.   Sincerely,   Iva Boop, M.D.  [facility] [phone]

## 2010-10-07 NOTE — Assessment & Plan Note (Signed)
Summary: cpx/njr R/S PER MD..SLM   Vital Signs:  Patient profile:   75 year old male Height:      74 inches Weight:      200 pounds BMI:     25.77 Temp:     97.8 degrees F oral BP sitting:   120 / 70  (left arm) Cuff size:   regular  Vitals Entered By: Westley Hummer CMA Deborra Medina) (June 12, 2010 11:07 AM) CC: annual wellness exam   Primary Care Provider:  Stevie Kern, MD  CC:  annual wellness exam.  History of Present Illness: Kristopher Wilson is a 75 year old male, who comes in today for general physical examination because of underlying hyperlipidemia, coronary artery disease, hypertension, reflux, esophagitis, and a new problem of postherpetic neuralgia.  also  a history of a prostatectomy for prostate cancer asymptomatic except for nocturia x 3  His basic problem is under excellent control with his medication.  He recently had a bout of shingles.  The rash is pretty much cleared, but the pain is persistent.  He is on Percocet 10 days 650 q.i.d. his pain varies from a 4 to an 8.  We also have him on acyclovir 800 mg q.i.d. and prednisone.  However, these two are not helping.  We will begin to taper the prednisone and add Neurontin.  He also states when he wakes up in the morning.  His pain is an 8 therefore, will give him a Percocet 4   in the morning. Here for Medicare AWV:  1.   Risk factors based on Past M, S, F history:...stable except for the postherpetic neuralgia 2.   Physical Activities: Place golf on a regular basis 3.   Depression/mood: good mood.  No depression 4.   Hearing: normal 5.   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   Fall Risk: none identified 7.   Home Safety: no guns in the house 8.   Height, weight, &visual acuity:height weight, normal right cataract removal due for left.  This fall 9.   Counseling: continue medications begin Neurontin as outlined 10.   Labs ordered based on risk factors: done today 11.           Referral  Coordination.........none indicated 12.           Care Plan...........Marland Kitchenreviewed in detail 13.            Cognitive Assessment .Marland Kitchen..oriented x 3 able to do all his own financial, calculations, and dependently    Allergies: 1)  ! Keflex  Past History:  Past medical, surgical, family and social histories (including risk factors) reviewed, and no changes noted (except as noted below).  Past Medical History: Reviewed history from 11/25/2009 and no changes required. Prostate cancer, hx of Hypertension hyperlipidemia Coronary artery disease ulcerative colitis GERD stent RCA, June 2009 Peripheral vascular disease Adenomatous Colon Polyps  Past Surgical History: Reviewed history from 11/25/2009 and no changes required. Prostatectomy  ?-PROSTATE CANCER Inguinal herniorrhaphy? X 5 LAST ONE IN 80'S Angioplasty/Stent x 2  Family History: Reviewed history from 11/25/2009 and no changes required. Family History of CAD Male 1st degree relative <60 Family History High cholesterol Family History Hypertension No FH of Colon Cancer: Father died MI age 63 Throat Cancer-Brother died age 64 MS-brother and son  Social History: Reviewed history from 11/25/2009 and no changes required. Retired  from [employer].  Now works at [employer] course Married Former Smoker Alcohol use-yes Drug use-no  Regular exercise-yes  Review of Systems      See HPI  Physical Exam  General:  Well-developed,well-nourished,in no acute distress; alert,appropriate and cooperative throughout examination Head:  Normocephalic and atraumatic without obvious abnormalities. No apparent alopecia or balding. Eyes:  No corneal or conjunctival inflammation noted. EOMI. Perrla. Funduscopic exam benign, without hemorrhages, exudates or papilledema. Vision grossly normal. Ears:  External ear exam shows no significant lesions or deformities.  Otoscopic examination reveals clear canals, tympanic membranes are  intact bilaterally without bulging, retraction, inflammation or discharge. Hearing is grossly normal bilaterally. Nose:  External nasal examination shows no deformity or inflammation. Nasal mucosa are pink and moist without lesions or exudates. Mouth:  Oral mucosa and oropharynx without lesions or exudates.  Teeth in good repair. Neck:  No deformities, masses, or tenderness noted. Chest Wall:  No deformities, masses, tenderness or gynecomastia noted. Breasts:  No masses or gynecomastia noted Lungs:  Normal respiratory effort, chest expands symmetrically. Lungs are clear to auscultation, no crackles or wheezes. Heart:  Normal rate and regular rhythm. S1 and S2 normal without gallop, murmur, click, rub or other extra sounds. Abdomen:  Bowel sounds positive,abdomen soft and non-tender without masses, organomegaly or hernias noted. Rectal:  uro Genitalia:  uro Prostate:  surgically removed Msk:  No deformity or scoliosis noted of thoracic or lumbar spine.   Extremities:  No clubbing, cyanosis, edema, or deformity noted with normal full range of motion of all joints.   Neurologic:  persistent numbness, left lower extremity Skin:  total body skin exam normal.  The area, which he had the shingles on his left ankle is healing. Cervical Nodes:  No lymphadenopathy noted Axillary Nodes:  No palpable lymphadenopathy Inguinal Nodes:  No significant adenopathy Psych:  Cognition and judgment appear intact. Alert and cooperative with normal attention span and concentration. No apparent delusions, illusions, hallucinations   Impression & Recommendations:  Problem # 1:  HERPES ZOSTER NOS (ICD-053.9) Assessment Improved  Problem # 2:  PERIPHERAL NEUROPATHY (ICD-356.9) Assessment: Deteriorated  Problem # 3:  HYPERTENSION (ICD-401.9) Assessment: Improved  His updated medication list for this problem includes:    Exforge Hct 10-320-25 Mg Tabs (Amlodipine-valsartan-hctz) ..... One tab once  daily  Orders: Prescription Created Electronically [phone]) Medicare -1st Annual Wellness Visit [phone])  Problem # 4:  PROSTATE CANCER, HX OF (ICD-V10.46) Assessment: Improved  Orders: Prescription Created Electronically [phone]) Medicare -1st Annual Wellness Visit [phone])  Complete Medication List: 1)  Crestor 20 Mg Tabs (Rosuvastatin calcium) .Marland Kitchen.. 1 once daily 2)  Zetia 10 Mg Tabs (Ezetimibe) .... Take 1 tablet by mouth every morning 3)  Aspirin 325 Mg Tabs (Aspirin) .Marland Kitchen.. 1 tablet by mouth once daily 4)  Plavix 75 Mg Tabs (Clopidogrel bisulfate) .... Take one tab once daily 5)  Exforge Hct 10-320-25 Mg Tabs (Amlodipine-valsartan-hctz) .... One tab once daily 6)  Pantoprazole Sodium 40 Mg Tbec (Pantoprazole sodium) .... Take on tab once daily 7)  Nitrostat 0.4 Mg Subl (Nitroglycerin) .Marland Kitchen.. 1 sl as needed angina 8)  Asacol Hd 800 Mg Tbec (Mesalamine) .... 2 tablets three times a day 9)  Prednisone 20 Mg Tabs (Prednisone) .... Uad 10)  Zovirax 800 Mg Tabs (Acyclovir) .... Take 1 tablet by mouth three times a day 11)  Percocet 10-650 Mg Tabs (Oxycodone-acetaminophen) .... Take 1 tablet by mouth 5  times a day 12)  Neurontin 300 Mg Caps (Gabapentin) .... Take 1 tablet by mouth three times a day  Patient Instructions: 1)  Neurontin 300 mg daily for two  days, then 300 mg twice daily for two days, then 300 mg 3 times a day.  Return in one week for follow-up.  Increase the Percocet to one tablet 5 times daily. 2)  continue the acyclovir, and 3 times daily and taper the prednisone, take one tablet daily for 3 days, then half a tablet every other day for two weeks and then stop 3)  Please schedule a follow-up appointment in 1 year. 4)  It is important that you exercise regularly at least 20 minutes 5 times a week. If you develop chest pain, have severe difficulty breathing, or feel very tired , stop exercising immediately and seek medical attention. 5)  Schedule a colonoscopy/sigmoidoscopy to  help detect colon cancer. 6)  Take an Aspirin every day. Prescriptions: PERCOCET 10-650 MG TABS (OXYCODONE-ACETAMINOPHEN) Take 1 tablet by mouth 5  times a day  #150 x 0   Entered and Authorized by:   Dorena Cookey MD   Signed by:   Dorena Cookey MD on 06/12/2010   Method used:   Print then Give to Patient   RxID:   [id number] ZOVIRAX 800 MG TABS (ACYCLOVIR) Take 1 tablet by mouth three times a day  #100 x 1   Entered and Authorized by:   Dorena Cookey MD   Signed by:   Dorena Cookey MD on 06/12/2010   Method used:   Elect[facility] to        [id number]       [address]       [place], [place]  [address]       Ph: [phone] or [phone]       Fax: [phone]   RxID:   [id number] NITROSTAT 0.4 MG SUBL (NITROGLYCERIN) 1 SL as needed angina  #25 x 1   Entered and Authorized by:   Dorena Cookey MD   Signed by:   Dorena Cookey MD on 06/12/2010   Method used:   [facility]        [id number]       [address]       [place], [place]  [address]       Ph: [phone] or [phone]       Fax: [phone]   RxID:   [id number] PANTOPRAZOLE SODIUM 40 MG TBEC (PANTOPRAZOLE SODIUM) take on tab once daily  #100 x 3   Entered and Authorized by:   Dorena Cookey MD   Signed by:   Dorena Cookey MD on 06/12/2010   Method used:   [facility]        [id number]       [address]       [place], [place]  [address]       Ph: [phone] or [phone]       Fax: [phone]   RxID:   [id number] EXFORGE HCT 10-320-25 MG TABS (AMLODIPINE-VALSARTAN-HCTZ) one tab once daily  #100 x 3   Entered and Authorized by:   Dorena Cookey MD   Signed by:   Dorena Cookey MD on 06/12/2010   Method used:   [facility]        [facility]       [address]       [place], [place]  [address]  Ph: [phone] or [phone]       Fax: [phone]   RxID:    [id number] PLAVIX 75 MG  TABS (CLOPIDOGREL BISULFATE) take one tab once daily  #100 x 3   Entered and Authorized by:   Dorena Cookey MD   Signed by:   Dorena Cookey MD on 06/12/2010   Method used:   [facility]        [id number]       [address]       [place], [place]  [address]       Ph: [phone] or [phone]       Fax: [phone]   RxID:   [id number] ZETIA 10 MG  TABS (EZETIMIBE) Take 1 tablet by mouth every morning  #100 x 3   Entered and Authorized by:   Dorena Cookey MD   Signed by:   Dorena Cookey MD on 06/12/2010   Method used:   [facility]        [id number]       [address]       [place], [place]  [address]       Ph: [phone] or [phone]       Fax: [phone]   RxID:   [id number] CRESTOR 20 MG  TABS (ROSUVASTATIN CALCIUM) 1 once daily  #100 x 3   Entered and Authorized by:   Dorena Cookey MD   Signed by:   Dorena Cookey MD on 06/12/2010   Method used:   [facility]        [id number]       [address]       [place], [place]  [address]       Ph: [phone] or [phone]       Fax: [phone]   RxID:   [id number] NEURONTIN 300 MG CAPS (GABAPENTIN) Take 1 tablet by mouth three times a day  #100 x 6   Entered and Authorized by:   Dorena Cookey MD   Signed by:   Dorena Cookey MD on 06/12/2010   Method used:   [facility]        [id number]       [address]       [place],   [address]       Ph: [phone] or [phone]       Fax: [phone]   RxID:   [id number]

## 2010-10-07 NOTE — Letter (Signed)
Summary: [facility]  [facility]   Imported By: Bubba Hales 12/31/2009 11:21:47  _____________________________________________________________________  External Attachment:    Type:   Image     Comment:   External Document

## 2010-10-07 NOTE — Letter (Signed)
Summary: New Patient letter  [facility]  [address] [place], [place] [address]   Phone: [phone]  Fax: [phone]       10/21/2009 MRN: [id number]  Kristopher Wilson [address] [place], [place]  [address]  Dear Kristopher Wilson,  Welcome to the Gastroenterology Division at [facility].    You are scheduled to see Dr.  Leone Payor on 11-25-09 at 9am on the 3rd floor at [facility], [address]  We ask that you try to arrive at our office 15 minutes prior to your appointment time to allow for check-in.  We would like you to complete the enclosed self-administered evaluation form prior to your visit and bring it with you on the day of your appointment.  We will review it with you.  Also, please bring a complete list of all your medications or, if you prefer, bring the medication bottles and we will list them.  Please bring your insurance card so that we may make a copy of it.  If your insurance requires a referral to see a specialist, please bring your referral form from your primary care physician.  Co-payments are due at the time of your visit and may be paid by cash, check or credit card.     Your office visit will consist of a consult with your physician (includes a physical exam), any laboratory testing he/she may order, scheduling of any necessary diagnostic testing (e.g. x-ray, ultrasound, CT-scan), and scheduling of a procedure (e.g. Endoscopy, Colonoscopy) if required.  Please allow enough time on your schedule to allow for any/all of these possibilities.    If you cannot keep your appointment, please call [phone] to cancel or reschedule prior to your appointment date.  This allows [place] the opportunity to schedule an appointment for another patient in need of care.  If you do not cancel or reschedule by 5 p.m. the business day prior to your appointment date, you will be charged a $50.00 late cancellation/no-show fee.    Thank you for choosing  [facility] for your medical needs.  We appreciate the opportunity to care for you.  Please visit [place] at our website  to learn more about our practice.                     Sincerely,                                                             The Gastroenterology Division

## 2010-10-07 NOTE — Progress Notes (Signed)
Summary: new Cardiologist  Phone Note Call from Patient   Caller: Patient Call For: Dorena Cookey MD Summary of Call: Dr. Ilda Foil is retiring, and would like Dr. Sherren Mocha to recommend a new Cardiologist. [phone] Initial call taken by: [facility] CMA AAMA,  August 05, 2010 1:54 PM  Follow-up for Phone Call        dalton mclean............Marland Kitchenwe can call and get you set up for an appointment..... how soon do you want to be seen??????????? Follow-up by: Dorena Cookey MD,  August 05, 2010 2:11 PM  Additional Follow-up for Phone Call Additional follow up Details #1::        Phone Call Completed Additional Follow-up by: Westley Hummer CMA Deborra Medina),  August 05, 2010 3:58 PM

## 2010-10-07 NOTE — Letter (Signed)
Summary: [facility]  [facility]   Imported By: Bubba Hales 12/31/2009 11:22:52  _____________________________________________________________________  External Attachment:    Type:   Image     Comment:   External Document

## 2010-10-07 NOTE — Assessment & Plan Note (Signed)
Summary: fup on leg issue//ccm   Vital Signs:  Patient profile:   75 year old male Weight:      208 pounds Temp:     97.6 degrees F oral BP sitting:   109 / 70  (left arm) Cuff size:   regular  Vitals Entered By: Westley Hummer CMA Deborra Medina) (May 29, 2010 12:36 PM) CC: insect bite left ankle Is Patient Diabetic? No   Primary Care Atanacio Melnyk:  Stevie Kern, MD  CC:  insect bite left ankle.  History of Present Illness: Kristopher Wilson is a 75 year old male, who comes in today for reevaluation of low back pain.  He said bed rest with Vicodin and Flexeril one half of each 3 times a day for the past two days.  He feels like his pain now is down to a 3 or 4.  It hurts most when he sits for long periods of time.  He now also has a rash on his left lower extremities.  He think he got a spider bite.  He wonders if there is any correlation specifically could this be zoster  Allergies: 1)  ! Keflex  Social History: Reviewed history from 11/25/2009 and no changes required. Retired  from [employer].  Now works at [employer] course Married Former Smoker Alcohol use-yes Drug use-no Regular exercise-yes  Review of Systems      See HPI  Physical Exam  General:  Well-developed,well-nourished,in no acute distress; alert,appropriate and cooperative throughout examination Msk:  No deformity or scoliosis noted of thoracic or lumbar spine.   Pulses:  R and L carotid,radial,femoral,dorsalis pedis and posterior tibial pulses are full and equal bilaterally Extremities:  No clubbing, cyanosis, edema, or deformity noted with normal full range of motion of all joints.   Neurologic:  No cranial nerve deficits noted. Station and gait are normal. Plantar reflexes are down-going bilaterally. DTRs are symmetrical throughout. Sensory, motor and coordinative functions appear intact. Skin:  there is a vesicular rash with 4 does think that the goals, right lower extremities did not appear to be  zoster.   Impression & Recommendations:  Problem # 1:  BACK PAIN, LUMBAR (ICD-724.2) Assessment Improved  His updated medication list for this problem includes:    Aspirin 325 Mg Tabs (Aspirin) .Marland Kitchen... 1 tablet by mouth once daily    Flexeril 5 Mg Tabs (Cyclobenzaprine hcl) .Marland Kitchen... 1 tab @ bedtime    Vicodin Es 7.5-750 Mg Tabs (Hydrocodone-acetaminophen) .Marland Kitchen... 1 tab @ bedtime  Complete Medication List: 1)  Crestor 20 Mg Tabs (Rosuvastatin calcium) .Marland Kitchen.. 1 once daily 2)  Zetia 10 Mg Tabs (Ezetimibe) .... Take 1 tablet by mouth every morning 3)  Aspirin 325 Mg Tabs (Aspirin) .Marland Kitchen.. 1 tablet by mouth once daily 4)  Plavix 75 Mg Tabs (Clopidogrel bisulfate) .... Take one tab once daily 5)  Exforge Hct 10-320-25 Mg Tabs (Amlodipine-valsartan-hctz) .... One tab once daily 6)  Pantoprazole Sodium 40 Mg Tbec (Pantoprazole sodium) .... Take on tab once daily 7)  Nitrostat 0.4 Mg Subl (Nitroglycerin) .Marland Kitchen.. 1 sl as needed angina 8)  Asacol Hd 800 Mg Tbec (Mesalamine) .... 2 tablets three times a day 9)  Flexeril 5 Mg Tabs (Cyclobenzaprine hcl) .Marland Kitchen.. 1 tab @ bedtime 10)  Vicodin Es 7.5-750 Mg Tabs (Hydrocodone-acetaminophen) .Marland Kitchen.. 1 tab @ bedtime  Patient Instructions: 1)  continue current medications.  If you don't feel by next week will making any improvement in call and we will get this set up for physical therapy

## 2010-10-09 NOTE — Assessment & Plan Note (Signed)
Summary: Cardiology Nuclear Testing  Nuclear Med Background Indications for Stress Test: Evaluation for Ischemia, Stent Patency   History: Heart Catheterization, Stents  History Comments: '09 cath/stents PLA/LAD  Symptoms: Chest Tightness, DOE, SOB  Symptoms Comments: bilat. exertional neck pain   Nuclear Pre-Procedure Cardiac Risk Factors: Family History - CAD, History of Smoking, Hypertension, Lipids, PVD Caffeine/Decaff Intake: None NPO After: 7:30 PM Lungs: clear IV 0.9% NS with Angio Cath: 22g     IV Site: R Antecubital IV Started by: Bonnita Levan, RN Chest Size (in) 44     Height (in): 75 Weight (lb): 205 BMI: 25.72  Nuclear Med Study 1 or 2 day study:  1 day     Stress Test Type:  Eugenie Birks Reading MD:  Kristeen Miss, MD     Referring MD:  D.McLean Resting Radionuclide:  Technetium 69m Tetrofosmin     Resting Radionuclide Dose:  10.9 mCi  Stress Radionuclide:  Technetium 38m Tetrofosmin     Stress Radionuclide Dose:  33.0 mCi   Stress Protocol  Max Systolic BP: 117 mm Hg Lexiscan: 0.4 mg   Stress Test Technologist:  Milana Na, EMT-P     Nuclear Technologist:  Domenic Polite, CNMT  Rest Procedure  Myocardial perfusion imaging was performed at rest 45 minutes following the intravenous administration of Technetium 81m Tetrofosmin.  Stress Procedure  The patient received IV Lexiscan 0.4 mg over 15-seconds.  Technetium 11m Tetrofosmin injected at 30-seconds.  There were no significant changes with infusion.  Quantitative spect images were obtained after a 45 minute delay.  QPS Raw Data Images:  Normal; no motion artifact; normal heart/lung ratio. Stress Images:  Normal homogeneous uptake in all areas of the myocardium. Rest Images:  Normal homogeneous uptake in all areas of the myocardium. Subtraction (SDS):  No evidence of ischemia. Transient Ischemic Dilatation:  1.0  (Normal <1.22)  Lung/Heart Ratio:  0.33  (Normal <0.45)  Quantitative Gated Spect  Images QGS EDV:  102 ml QGS ESV:  34 ml QGS EF:  67 % QGS cine images:  Normal LV systolic function.  Findings Low risk nuclear study      Overall Impression  Exercise Capacity: Lexiscan with no exercise. BP Response: Normal blood pressure response. Clinical Symptoms: No chest pain ECG Impression: No significant ST segment change suggestive of ischemia. Overall Impression: Normal stress nuclear study. Overall Impression Comments: There is no evidence of ischemia. The LV function is normal.  Appended Document: Cardiology Nuclear Testing normal study  Appended Document: Cardiology Nuclear Testing pt notified of results

## 2010-10-09 NOTE — Progress Notes (Signed)
Summary: KLOR-CON  Phone Note Refill Request Message from:  Patient on September 11, 2010 8:33 AM  Refills Requested: Medication #1:  KLOR-CON M10 10 MEQ CR-TABS one every other day with Lasix to please have it sent to [facility]. pt does not have the fax number or phone number.   Method Requested: Fax to [place] Initial call taken by: Regan Lemming,  September 11, 2010 8:34 AM    Prescriptions: KLOR-CON M10 10 MEQ CR-TABS (POTASSIUM CHLORIDE CRYS CR) one every other day with Lasix  #15 x 6   Entered by:   Hansel Feinstein CMA   Authorized by:   [name], MD   Signed by:   Hansel Feinstein CMA on 09/11/2010   Method used:   [facility]        [id number]       [address]       [place], [place]  [address]       Ph: [phone] or [phone]       Fax: [phone]   RxID:   [id number] KLOR-CON M10 10 MEQ CR-TABS (POTASSIUM CHLORIDE CRYS CR) one every other day with Lasix  #15 x 6   Entered by:   Hansel Feinstein CMA   Authorized by:   [name], MD   Signed by:   Hansel Feinstein CMA on 09/11/2010   Method used:   [facility]        [id number]       [address]       [place],   [address]       Ph: [phone] or [phone]       Fax: [phone]   RxID:   [id number]

## 2010-10-09 NOTE — Medication Information (Signed)
Summary: Physician's Orders   Physician's Orders   Imported By: Roderic Ovens 08/20/2010 14:11:58  _____________________________________________________________________  External Attachment:    Type:   Image     Comment:   External Document

## 2010-10-09 NOTE — Progress Notes (Signed)
Summary: nuc pre-procedure  Phone Note Outgoing Call   Call placed by: Harlow Asa CNMT Call placed to: Patient Reason for Call: Confirm/change Appt Summary of Call: Reviewed information on [employer] Information Sheet (see scanned document for further details).  Spoke with patient.      Nuclear Med Background Indications for Stress Test: Evaluation for Ischemia, Stent Patency   History: Heart Catheterization, Stents  History Comments: '09 cath/stents PLA/LAD  Symptoms: Chest Tightness  Symptoms Comments: bilat. exertional neck pain   Nuclear Pre-Procedure Cardiac Risk Factors: Family History - CAD, History of Smoking, Hypertension, Lipids, PVD Height (in): 75

## 2010-10-09 NOTE — Assessment & Plan Note (Signed)
Summary: PER CHECK OUT/SF   Visit Type:  Follow-up Referring Provider:  n/a Primary Provider:  Kelle Darting, MD   History of Present Illness: 75 yo with history of CAD, ulcerative colitis, and shingles with post-herpetic neuralgia presents for cardiology followup.  Patient had PCI in 2000 with BMS to LAD and repeat PCI in 2009 with DES to PLV.  Given some atypical chest pain, patient had a Lexiscan myoview done in 12/11.  This showed no evidence for ischemia or infarction.  Echo showed normal LV systolic function.  Patient has had no further chest pain.  He his activity continues to be limited by a left leg neuropathy that has been thought to be due to post-herpetic neuralgia.  He has been seen by neurology for this.    When I last saw him, creatinine was noted to be elevated in the setting of daily Lasix.  Creatinine decreased back to 1.3 with every other day dosing of Lasix. Patient's weight is actually down 9 lbs since last appointment, likely due in part to an intercurrent gastroenteritis which has now resolved.    Labs (9/11): K 4.4, creatinine 1.4, TSH normal, LDL 33, HDL 41 Labs (12/11): BNP 48 => 190, creatinine 2.4 => 1.3, K 4.1  Current Medications (verified): 1)  Crestor 20 Mg  Tabs (Rosuvastatin Calcium) .Marland Kitchen.. 1 Once Daily 2)  Zetia 10 Mg  Tabs (Ezetimibe) .... Take 1 Tablet By Mouth Every Morning 3)  Plavix 75 Mg  Tabs (Clopidogrel Bisulfate) .... Take One Tab Once Daily 4)  Pantoprazole Sodium 40 Mg Tbec (Pantoprazole Sodium) .... Take On Tab Once Daily 5)  Nitrostat 0.4 Mg Subl (Nitroglycerin) .Marland Kitchen.. 1 Sl As Needed Angina 6)  Asacol Hd 800 Mg Tbec (Mesalamine) .... 3  Tablets Two  Times A Day 7)  Percocet 10-650 Mg Tabs (Oxycodone-Acetaminophen) .... Take 1 Tablet By Mouth Q 4h 8)  Neurontin 300 Mg Caps (Gabapentin) .... Take 2  Tablet By Mouth 3 X  Times A Day 9)  Amlodipine Besylate 10 Mg Tabs (Amlodipine Besylate) .... One Daily 10)  Coreg 6.25 Mg Tabs (Carvedilol) .... One  Twice A Day 11)  Furosemide 20 Mg Tabs (Furosemide) .... One Every Other Day 12)  Klor-Con M10 10 Meq Cr-Tabs (Potassium Chloride Crys Cr) .... One Every Other Day With Lasix  Allergies: 1)  ! Keflex  Past History:  Past Medical History: 1. CORONARY ARTERY DISEASE: PCI 7/00 with BMS to LAD.  PCI 2009 with Promus DES to PLV.  Lexiscan myoview (12/11): no evidence for ischemia or infarction, EF 67%.    2. HYPERTENSION  3. VENOUS INSUFFICIENCY 4. POSTHERPETIC NEURALGIA: affecting left lower leg.  5. COLONIC POLYPS, ADENOMATOUS, HX OF 6. ULCERATIVE COLITIS, LEFT SIDED  7. BACK PAIN, LUMBAR  8. URTICARIA, ALLERGIC 9. RESTLESS LEG SYNDROME 10. GERD 11. BENIGN PAROXYSMAL POSITIONAL VERTIGO  12. HYPERTHYROIDISM 13. PROSTATE CANCER: s/p prostatectomy 14. Hyperlipidemia 15. CKD 16. Diastolic CHF: Echo (12/11) EF 29-52%, mild LAE.  BNP 190 in 12/11.   Family History: Reviewed history from 08/12/2010 and no changes required. Family History High cholesterol Family History Hypertension No FH of Colon Cancer: Father died MI age 45 Throat Cancer-Brother died age 58 MS-brother and son Family History of Heart Disease: Father with MI at 85 Family History of Diabetes: Uncle  Social History: Reviewed history from 06/16/2010 and no changes required. Retired  from [employer].  Now works at [employer] course Married Former Smoker-stopped 35 years ago Alcohol use-yes-2 drinks per  day Drug use-no Patient does not get regular exercise.   Vital Signs:  Patient profile:   75 year old male Height:      75 inches Weight:      196 pounds BMI:     24.59 Pulse rate:   68 / minute BP sitting:   110 / 70  (left arm)  Vitals Entered By: Laurance Flatten CMA (September 05, 2010 8:14 AM)  Physical Exam  General:  Well developed, well nourished, in no acute distress. Neck:  Neck supple, no JVD. No masses, thyromegaly or abnormal cervical nodes. Lungs:  Clear bilaterally to  auscultation and percussion. Heart:  Non-displaced PMI, chest non-tender; regular rate and rhythm, S1, S2 without murmurs, rubs or gallops. Carotid upstroke normal, no bruit.  Pedals normal pulses. Trace left lower leg edema.  Abdomen:  Bowel sounds positive; abdomen soft and non-tender without masses, organomegaly, or hernias noted. No hepatosplenomegaly. Extremities:  No clubbing or cyanosis. Neurologic:  Alert and oriented x 3. Psych:  Normal affect.   Impression & Recommendations:  Problem # 1:  CORONARY ARTERY DISEASE (ICD-414.00) No further chest pain.  Recent negative Lexiscan myoview.  Continue statin, Plavix, Coreg.  Will add ASA 81 mg daily to his regimen.  LDL has been at goal (< 70).   Problem # 2:  EDEMA (ICD-782.3) There is a component of chronic diastolic CHF (elevated BNP).  Patient also likely has venous insufficiency.  No significant exertional dyspnea.  Volume status looks ok today on Lasix 20 mg every other day and creatinine is back down to 1.3.   Patient Instructions: 1)  Your physician recommends that you schedule a follow-up appointment in: 4 months with Dr Shirlee Latch 2)  Your physician recommends that you continue on your current medications as directed. Please refer to the Current Medication list given to you today. 3)  Add Asa 81 mg a day Prescriptions: FUROSEMIDE 20 MG TABS (FUROSEMIDE) one every other day  #90 x 3   Entered by:   Charolotte Capuchin, RN   Authorized by:   Marca Ancona, MD   Signed by:   Charolotte Capuchin, RN on 09/05/2010   Method used:   Faxed to ...       [facility] (mail-order)             , [place]         Ph: [phone]       Fax: [phone]   RxID:   [id number] COREG 6.25 MG TABS (CARVEDILOL) one twice a day  #180 x 3   Entered by:   Charolotte Capuchin, RN   Authorized by:   Marca Ancona, MD   Signed by:   Charolotte Capuchin, RN on 09/05/2010   Method used:   Faxed to ...       [facility] (mail-order)             , [place]          Ph: [phone]       Fax: [phone]   RxID:   [id number] AMLODIPINE BESYLATE 10 MG TABS (AMLODIPINE BESYLATE) one daily  #90 x 3   Entered by:   Charolotte Capuchin, RN   Authorized by:   Marca Ancona, MD   Signed by:   Charolotte Capuchin, RN on 09/05/2010   Method used:   Faxed to ...       [facility] (mail-order)             , [place]  Ph: [phone]       Fax: [phone]   RxID:   [id number]

## 2010-10-09 NOTE — Progress Notes (Signed)
Summary: handicap sticker  Phone Note Call from Patient Call back at Home Phone [phone]   Caller: Patient message Call For: Amia Rynders Summary of Call: pt was dx with shingles and now he can't walk and wants a handicap tag Initial call taken by: Alfred Levins, CMA,  September 09, 2010 12:51 PM  Follow-up for Phone Call        temporary placard given Follow-up by: Kern Reap CMA Duncan Dull),  September 10, 2010 9:56 AM

## 2010-10-09 NOTE — Progress Notes (Signed)
Summary: KLOR-CON  Phone Note Refill Request   Refills Requested: Medication #1:  KLOR-CON M10 10 MEQ CR-TABS one every other day with Lasix pt request klor-con be called [facility], but [facility], pt only has a few pills left, can call this in asap?   Method Requested: Telephone to Pharmacy Initial call taken by: Lorenda Hatchet,  September 19, 2010 1:40 PM    Prescriptions: KLOR-CON M10 10 MEQ CR-TABS (POTASSIUM CHLORIDE CRYS CR) one every other day with Lasix  #45 x 2   Entered by:   Hansel Feinstein CMA   Authorized by:   [name], MD   Signed by:   Hansel Feinstein CMA on 09/19/2010   Method used:   Electronically to        [facility]* (mail-order)       [place]  [address]       Ph: [phone]       Fax: [phone]   [place]:   [phone]

## 2010-10-15 ENCOUNTER — Telehealth: Payer: Self-pay | Admitting: Family Medicine

## 2010-10-15 DIAGNOSIS — M545 Low back pain, unspecified: Secondary | ICD-10-CM

## 2010-10-15 NOTE — Telephone Encounter (Signed)
Refill oxycodone 10-652m. Please call pt when ready.

## 2010-10-17 ENCOUNTER — Telehealth: Payer: Self-pay | Admitting: Family Medicine

## 2010-10-17 NOTE — Telephone Encounter (Signed)
Error/njr °

## 2010-10-21 MED ORDER — OXYCODONE-ACETAMINOPHEN 10-650 MG PO TABS: 1.0000 | ORAL_TABLET | ORAL | Status: AC | PRN

## 2010-10-21 NOTE — Telephone Encounter (Signed)
Addended by: Kern Reap on: 10/21/2010 04:38 PM   Modules accepted: Orders

## 2010-10-21 NOTE — Telephone Encounter (Signed)
Pt called to check on status of refill req for Oxycodone 10-671m. Pt is leaving to go out of town and is req to pick up today, if possible.

## 2010-10-21 NOTE — Telephone Encounter (Signed)
Is this okay to fill? 

## 2010-10-21 NOTE — Telephone Encounter (Signed)
ok 

## 2010-11-18 ENCOUNTER — Ambulatory Visit (INDEPENDENT_AMBULATORY_CARE_PROVIDER_SITE_OTHER): Payer: Federal, State, Local not specified - PPO | Admitting: Family Medicine

## 2010-11-18 ENCOUNTER — Encounter: Payer: Self-pay | Admitting: Family Medicine

## 2010-11-18 DIAGNOSIS — B0229 Other postherpetic nervous system involvement: Secondary | ICD-10-CM

## 2010-11-18 NOTE — Patient Instructions (Signed)
Soaked your feet in warm water and file  The toenails.  Weekly.  Call the orthopedist for evaluation of his shoulders.  Over-the-counter steroid cream twice daily for the rash on your foot

## 2010-11-18 NOTE — Progress Notes (Signed)
  Subjective:    Patient ID: Kristopher Wilson, male    DOB: [birth year], 75 y.o.   MRN: [id number]  HPIBob is a 75 year old male, who comes in today for evaluation of 3 problems.  He's had a rash on the lateral side of his foot.  Non-pruritic for a couple weeks.  He has a fungal infection in his toenail he would like a trend.  It's the same toenail that he has the neuropathy.  He's also developed right left shoulder pain for 3 weeks.  No history of trauma    Review of Systems    Otherwise negative Objective:   Physical Exam    Well-developed well-nourished, male in no acute distress.  Examination of foot shows a rash consistent with eczema.  Also fungal infection. Left great toenail.  The nail was trimmed    Assessment & Plan:  Eczema......... OTC steroid cream b.i.d.  Fungal infection toenail........... Trimmed 75% of the nail off.........Marland Kitchen  file weekly.  Right left shoulder pain, referred Orthopedics

## 2010-12-01 ENCOUNTER — Other Ambulatory Visit: Payer: Self-pay | Admitting: Family Medicine

## 2010-12-01 DIAGNOSIS — M25519 Pain in unspecified shoulder: Secondary | ICD-10-CM

## 2010-12-09 ENCOUNTER — Encounter: Payer: Self-pay | Admitting: Cardiology

## 2010-12-12 LAB — BASIC METABOLIC PANEL
CO2: 23 mEq/L (ref 19–32)
Calcium: 9.3 mg/dL (ref 8.4–10.5)
Creatinine, Ser: 1.22 mg/dL (ref 0.4–1.5)
GFR calc Af Amer: >60 mL/min (ref >60)
GFR calc non Af Amer: 59 mL/min — ABNORMAL LOW (ref >60)
Glucose, Bld: 98 mg/dL (ref 70–99)
Sodium: 138 mEq/L (ref 135–145)

## 2010-12-12 LAB — DIFFERENTIAL
Basophils Absolute: 0 10*3/uL (ref 0.0–0.1)
Basophils Relative: 0 % (ref 0–1)
Lymphocytes Relative: 28 % (ref 12–46)
Monocytes Absolute: 0.4 10*3/uL (ref 0.1–1.0)
Neutro Abs: 3 10*3/uL (ref 1.7–7.7)
Neutrophils Relative %: 60 % (ref 43–77)

## 2010-12-12 LAB — POCT CARDIAC MARKERS
CKMB, poc: 2.2 ng/mL (ref 1.0–8.0)
CKMB, poc: 2.9 ng/mL (ref 1.0–8.0)
Myoglobin, poc: 84.6 ng/mL (ref 12–200)
Myoglobin, poc: 87.3 ng/mL (ref 12–200)
Troponin i, poc: <0.05 ng/mL (ref 0.00–0.09)

## 2010-12-12 LAB — CBC
Hemoglobin: 13.8 g/dL (ref 13.0–17.0)
MCHC: 34.9 g/dL (ref 30.0–36.0)
RDW: 13.6 % (ref 11.5–15.5)

## 2010-12-16 ENCOUNTER — Telehealth: Payer: Self-pay | Admitting: Family Medicine

## 2010-12-16 MED ORDER — OXYCODONE-ACETAMINOPHEN 10-650 MG PO TABS: 1.0000 | ORAL_TABLET | ORAL | Status: DC | PRN

## 2010-12-16 NOTE — Telephone Encounter (Signed)
Ok ref x 3 mo,

## 2010-12-16 NOTE — Telephone Encounter (Signed)
Pt needs new rx percocet 10-671m. Pt has about 2 wks left of med

## 2010-12-17 NOTE — Telephone Encounter (Signed)
rx is ready to pick up and patient is aware

## 2010-12-18 ENCOUNTER — Encounter: Payer: Self-pay | Admitting: Cardiology

## 2010-12-18 ENCOUNTER — Ambulatory Visit (INDEPENDENT_AMBULATORY_CARE_PROVIDER_SITE_OTHER): Payer: Federal, State, Local not specified - PPO | Admitting: Cardiology

## 2010-12-18 VITALS — BP 119/74 | HR 59 | Ht 76.0 in | Wt 199.0 lb

## 2010-12-18 DIAGNOSIS — I5032 Chronic diastolic (congestive) heart failure: Secondary | ICD-10-CM

## 2010-12-18 DIAGNOSIS — I2510 Atherosclerotic heart disease of native coronary artery without angina pectoris: Secondary | ICD-10-CM

## 2010-12-18 DIAGNOSIS — I509 Heart failure, unspecified: Secondary | ICD-10-CM

## 2010-12-18 DIAGNOSIS — E785 Hyperlipidemia, unspecified: Secondary | ICD-10-CM

## 2010-12-18 DIAGNOSIS — R609 Edema, unspecified: Secondary | ICD-10-CM

## 2010-12-18 DIAGNOSIS — R0602 Shortness of breath: Secondary | ICD-10-CM

## 2010-12-18 LAB — BASIC METABOLIC PANEL
Chloride: 103 mEq/L (ref 96–112)
Creatinine, Ser: 1.3 mg/dL (ref 0.4–1.5)
GFR: 60.25 mL/min (ref >60.00)

## 2010-12-18 LAB — BRAIN NATRIURETIC PEPTIDE: Pro B Natriuretic peptide (BNP): 50.9 pg/mL (ref 0.0–100.0)

## 2010-12-18 NOTE — Assessment & Plan Note (Signed)
There is a component of chronic diastolic CHF (elevated BNP).  Patient also likely has venous insufficiency.  Edema is improved with compression stocking use.  No significant exertional dyspnea.  Volume status looks ok today on Lasix 20 mg every other day.

## 2010-12-18 NOTE — Assessment & Plan Note (Signed)
Check lipids today, goal LDL < 70.

## 2010-12-18 NOTE — Patient Instructions (Signed)
Lab today--Lipid profile/BMP/BNP 414.01  428.32    Your physician wants you to follow-up in: 6 months with Dr Aundra Dubin. (October 2012) You will receive a reminder letter in the mail two months in advance. If you don't receive a letter, please call our office to schedule the follow-up appointment.

## 2010-12-18 NOTE — Progress Notes (Signed)
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  Patient had PCI in 2000 with BMS to LAD and repeat PCI in 2009 with DES to PLV.  Given some atypical chest pain, patient had a Lexiscan myoview done in 12/11.  This showed no evidence for ischemia or infarction.  Echo showed normal LV systolic function.  He his activity continues to be limited by a left leg neuropathy that has been thought to be due to post-herpetic neuralgia.  He has been seen by neurology for this. He is wearing an orthotic on the left leg because of foot drop.    Patient gets occasional sharp lower chest pain.  This usually occurs at rest and lasts < 1 minute.  No exertional pain.  Patient also denies exertional dyspnea.  He has been playing golf and goes to the [facility] where he rides a stationary bike.  He wears compression stockings to try to control his lower leg edema.   Labs (9/11): K 4.4, creatinine 1.4, TSH normal, LDL 33, HDL 41 Labs (12/11): BNP 48 => 190, creatinine 2.4 => 1.3, K 4.1  ECG: NSR at 50, otherwise normal  Allergies:  1)  ! Keflex  Past Medical History: 1. CORONARY ARTERY DISEASE: PCI 7/00 with BMS to LAD.  PCI 2009 with Promus DES to PLV.  Lexiscan myoview (12/11): no evidence for ischemia or infarction, EF 67%.    2. HYPERTENSION  3. VENOUS INSUFFICIENCY 4. POSTHERPETIC NEURALGIA: affecting left lower leg.  5. COLONIC POLYPS, ADENOMATOUS, HX OF 6. ULCERATIVE COLITIS, LEFT SIDED  7. BACK PAIN, LUMBAR  8. URTICARIA, ALLERGIC 9. RESTLESS LEG SYNDROME 10. GERD 11. BENIGN PAROXYSMAL POSITIONAL VERTIGO  12. HYPERTHYROIDISM 13. PROSTATE CANCER: s/p prostatectomy 14. Hyperlipidemia 15. CKD 16. Diastolic CHF: Echo (96/75) EF 55-60%, mild LAE.  BNP 190 in 12/11.   Family History: Family History High cholesterol Family History Hypertension No FH of Colon Cancer: Father died MI age 30 Throat Cancer-Brother died age  38 MS-brother and son Family History of Heart Disease: Father with MI at 76 Family History of Diabetes: Uncle  Social History: Retired  from [employer].  Now works at [employer] course Married Former Smoker-stopped 35 years ago Alcohol use-yes-2 drinks per day Drug use-no  Current Outpatient Prescriptions  Medication Sig Dispense Refill  . amLODipine (NORVASC) 10 MG tablet Take 10 mg by mouth daily.        Marland Kitchen aspirin 81 MG tablet Take 81 mg by mouth daily.        . carvedilol (COREG) 6.25 MG tablet Take 6.25 mg by mouth 2 (two) times daily with a meal.        . clopidogrel (PLAVIX) 75 MG tablet Take 75 mg by mouth daily.        Marland Kitchen ezetimibe (ZETIA) 10 MG tablet Take 10 mg by mouth daily.        . furosemide (LASIX) 20 MG tablet Take 20 mg by mouth every other day.        . gabapentin (NEURONTIN) 300 MG capsule Take 600 mg by mouth 3 (three) times daily.        . Mesalamine (ASACOL HD) 800 MG TBEC Take 3 tablets by mouth 2 (two) times daily.        . nitroGLYCERIN (NITROSTAT) 0.4 MG SL tablet Place 0.4 mg under the tongue every 5 (five) minutes as needed.        Marland Kitchen  oxyCODONE-acetaminophen (PERCOCET) 10-650 MG per tablet Take 1 tablet by mouth 3 (three) times daily.        . pantoprazole (PROTONIX) 40 MG tablet Take 40 mg by mouth daily.        . potassium chloride (KLOR-CON) 10 MEQ CR tablet Take 10 mEq by mouth every other day.        . rosuvastatin (CRESTOR) 20 MG tablet Take 20 mg by mouth daily.        Marland Kitchen DISCONTD: oxyCODONE-acetaminophen (PERCOCET) 10-650 MG per tablet Take 1 tablet by mouth every 4 (four) hours as needed.  200 tablet  0    BP 119/74  Pulse 59  Ht 6' 4"  (1.93 m)  Wt 199 lb (90.266 kg)  BMI 24.22 kg/m2 General:  Well developed, well nourished, in no acute distress. Neck:  Neck supple, no JVD. No masses, thyromegaly or abnormal cervical nodes. Lungs:  Clear bilaterally to auscultation and percussion. Heart:  Non-displaced PMI, chest non-tender;  regular rate and rhythm, S1, S2 without murmurs, rubs or gallops. Carotid upstroke normal, no bruit.  Pedals normal pulses. 1+ right ankle edema.  Abdomen:  Bowel sounds positive; abdomen soft and non-tender without masses, organomegaly, or hernias noted. No hepatosplenomegaly. Extremities:  No clubbing or cyanosis. Neurologic:  Alert and oriented x 3. Psych:  Normal affect.

## 2010-12-18 NOTE — Assessment & Plan Note (Signed)
No further chest pain.  Recent negative Lexiscan myoview.  Continue statin, Plavix, Coreg, ASA 81 mg.

## 2010-12-23 ENCOUNTER — Telehealth: Payer: Self-pay | Admitting: Cardiology

## 2010-12-23 NOTE — Telephone Encounter (Signed)
rtn call

## 2010-12-24 NOTE — Telephone Encounter (Signed)
I talked with pt about recent lab results 

## 2011-01-20 NOTE — H&P (Signed)
NAMETALIS, IWAN NO.:  [id number]   MEDICAL RECORD NO.:  [id number]          PATIENT TYPE:  INP   LOCATION:  3662                         FACILITY:  [facility]   PHYSICIAN:  Barnett Abu, M.D.  DATE OF BIRTH:  [birth year]   DATE OF ADMISSION:  02/29/2008  DATE OF DISCHARGE:                              HISTORY & PHYSICAL   CHIEF COMPLAINT:  Chest pain.   HISTORY OF PRESENT ILLNESS:  Kristopher Wilson is a pleasant 75 year old  male patient with known single-vessel coronary artery disease, status  post stent implantation to the LAD in July 2000.  He presents to the  clinic today with two episodes of chest pain consistent with angina he  had in the past.  The first episode began yesterday as he was lifting a  basket of golf ball.  He began having chest pressure that radiated to  the left jaw and down the left arm.  There was no associated shortness  of breath, sweats, or nausea.  It lasted approximately 45 minutes.  He  did not have any nitroglycerin with him.  He felt fine the rest of the  evening, but this morning when he was on the golf course swinging the  club, he developed the same symptoms.  He presented to the clinic for  evaluation.  EKG revealed subtle ST changes in leads III and aVF.   REVIEW OF SYSTEMS:  GENERAL:  Energy level has been good.  No fever,  chills, or recent illness.  HEENT:  No problems with vision, hearing,  nasal obstruction, epistaxis, or sore throat.  NECK:  No stiffness.  CHEST:  As above.  Denies palpitations or tachyarrhythmias.  LUNGS: No  cough, dyspnea, or PND orthopnea.  No wheezing.  ABDOMEN:  Denies  nausea, vomiting, abdominal pain, or problems with stools.  No melena or  hematochezia.  GU: Denies dysuria or frequency, hematuria, or nocturia.  HEMATOLOGIC:  Denies any bruising.  No anemia.  No problems with  clotting.  EXTREMITIES:  Denies any joint stiffness, swelling, or  deformity.  Also, denies any peripheral edema.  No  leg claudication  symptoms.   PAST MEDICAL HISTORY:  1. Coronary artery disease status post PTCA and stent in the mid LAD      on April 02, 1999, (NIR [place] 4.0/12 mm BE presentation for above ).  2. Progressive typical angina.  3. GSPECT normal perfusion, 10 minutes, EF 69%, Jan 11, 2007.  4. Hypertension, well controlled.  5. Ulcerative colitis.  6. History of prostate cancer.  7. Dyslipidemia.  8. History of mild renal insufficiency, last creatinine 1.5   PAST SURGICAL HISTORY:  Bilateral hernia repair.   CURRENT MEDICATIONS:  1. Aspirin 325 mg a day.  2. Folic acid 1 mg a day.  3. Asacol 400 mg twice a day.  4. Nexium 40 mg daily.  5. Crestor 20 mg daily.  6. Exforge 10/320 mg, 1 a day  7. Zetia 10 mg a day.   DRUG ALLERGIES:  BP medication, unknown name, caused rash in the past.   FAMILY HISTORY:  Mother  died, questionable, had lung disease.  Father  died of an MI at age 31.  One brother died secondary to cancer of the  esophagus.  He has one brother with MS.   SOCIAL HISTORY:  The patient stopped smoking approximately 35 years ago  prior to that he smoked a pack a day for 10-15 years.  Alcohol  occasionally.  No drug use.   OBJECTIVE:  VITAL SIGNS:  Weight is 202 pounds, pulse is 64, and blood  pressure is 154/96.  GENERAL:  Pleasant gentleman in no acute distress.  SKIN:  Warm and dry.  HEENT:  Unremarkable.  NECK:  No JVD.  Carotid upstrokes are +2 bilaterally.  No bruits  auscultated.  CHEST:  Good excursion that clears throughout without rales, rhonchi, or  wheezing.  HEART:  Normal S1and S2 without murmur, click, or rub.  ABDOMEN:  Soft and nontender.  Bowel sounds are present.  No abdominal  bruits.  No abdominal masses.  No hepatosplenomegaly.  GU/RECTAL:  Deferred.  EXTREMITIES:  Distal pulses +2.  Full range of motion.  No clubbing,  cyanosis, or edema.  NEUROLOGIC :  Nonfocal.   LABORATORY DATA:  EKG revealed normal sinus rhythm, heart rate of 64   beats per minute.  There is a T-wave inversion in lead III, which is  new, also some T-wave flattening in aVF.  This was compared with the EKG  back in April 2008.   IMPRESSION:  1. Chest pain worrisome for angina.  2. Known single-vessel coronary artery disease.  3. Mild renal insufficiency.  4. Ulcerative colitis.  5. Hyperlipidemia.  6. Hypertension.   PLAN:  1. Admit.  2. Check cardiac isoenzymes.  3. Start on low-dose beta blocker.  4. Lovenox subcu.  NPO after midnight for cardiac catheterization.      Dr. Leonia Reeves to follow.      Tamera C. Gwyndolyn Saxon      Barnett Abu, M.D.  Electronically Signed    TCL/MEDQ  D:  02/29/2008  T:  03/01/2008  Job:  [id number]

## 2011-03-06 ENCOUNTER — Telehealth: Payer: Self-pay | Admitting: Family Medicine

## 2011-03-06 NOTE — Telephone Encounter (Signed)
Refill Oxycontin apap 10-663m send to [facility].

## 2011-03-09 MED ORDER — OXYCODONE-ACETAMINOPHEN 10-650 MG PO TABS: 1.0000 | ORAL_TABLET | Freq: Three times a day (TID) | ORAL | Status: DC

## 2011-03-09 NOTE — Telephone Encounter (Signed)
Ok,,,,,,,,,,,,be sure that he signs a pain contract, when he comes in

## 2011-03-09 NOTE — Telephone Encounter (Signed)
rx and contract ready for pick up and wife is aware

## 2011-05-07 ENCOUNTER — Encounter: Payer: Self-pay | Admitting: Family Medicine

## 2011-05-07 ENCOUNTER — Ambulatory Visit (INDEPENDENT_AMBULATORY_CARE_PROVIDER_SITE_OTHER): Payer: Federal, State, Local not specified - PPO | Admitting: Family Medicine

## 2011-05-07 DIAGNOSIS — B0229 Other postherpetic nervous system involvement: Secondary | ICD-10-CM

## 2011-05-07 DIAGNOSIS — G609 Hereditary and idiopathic neuropathy, unspecified: Secondary | ICD-10-CM

## 2011-05-07 LAB — BASIC METABOLIC PANEL
Calcium: 9.1 mg/dL (ref 8.4–10.5)
Chloride: 103 mEq/L (ref 96–112)
Creatinine, Ser: 1.2 mg/dL (ref 0.4–1.5)
GFR: 66.26 mL/min (ref >60.00)

## 2011-05-07 LAB — POCT URINALYSIS DIPSTICK
Bilirubin, UA: NEGATIVE
Ketones, UA: NEGATIVE
Leukocytes, UA: NEGATIVE
Nitrite, UA: NEGATIVE
Protein, UA: NEGATIVE
pH, UA: 6

## 2011-05-07 LAB — LIPID PANEL: Total CHOL/HDL Ratio: 3

## 2011-05-07 LAB — HEPATIC FUNCTION PANEL
ALT: 20 U/L (ref 0–53)
Alkaline Phosphatase: 69 U/L (ref 39–117)
Bilirubin, Direct: 0.1 mg/dL (ref 0.0–0.3)
Total Bilirubin: 0.6 mg/dL (ref 0.3–1.2)
Total Protein: 6.3 g/dL (ref 6.0–8.3)

## 2011-05-07 LAB — CBC WITH DIFFERENTIAL/PLATELET
Basophils Relative: 0.3 % (ref 0.0–3.0)
Eosinophils Relative: 4.7 % (ref 0.0–5.0)
Lymphocytes Relative: 32.4 % (ref 12.0–46.0)
MCV: 87.9 fl (ref 78.0–100.0)
Monocytes Relative: 6.7 % (ref 3.0–12.0)
Neutrophils Relative %: 55.9 % (ref 43.0–77.0)
Platelets: 206 10*3/uL (ref 150.0–400.0)
RBC: 4.38 Mil/uL (ref 4.22–5.81)
WBC: 5.4 10*3/uL (ref 4.5–10.5)

## 2011-05-07 LAB — VITAMIN B12: Vitamin B-12: 233 pg/mL (ref 211–911)

## 2011-05-07 LAB — PSA: PSA: 0.16 ng/mL (ref 0.10–4.00)

## 2011-05-07 LAB — LDL CHOLESTEROL, DIRECT: Direct LDL: 41.1 mg/dL

## 2011-05-07 MED ORDER — OXYCODONE-ACETAMINOPHEN 10-650 MG PO TABS: 1.0000 | ORAL_TABLET | Freq: Three times a day (TID) | ORAL | Status: DC

## 2011-05-07 NOTE — Patient Instructions (Signed)
Hold the Norvasc tomorrow.  On Saturday, restart the Norvasc, but only take 5 mg daily.  Blood pressure checked daily in the morning.  Return in two weeks for follow-up.  Continue the Percocet 3 times a day for pain

## 2011-05-07 NOTE — Progress Notes (Signed)
  Subjective:    Patient ID: Kristopher Wilson, male    DOB: 05-Sep-1936, 75 y.o.   MRN: [id number]  HPI Kristopher Wilson is a 75 year old, married male, nonsmoker, who comes in today for evaluation of decreased appetite, chills.  He said a history of neuropathy, secondary to shingles and has been followed by [place] and neurology.  They discontinued the Neurontin because it didn't seem to help.  He now takes Percocet 10 mg 3 tablets daily to control his pain.  He stated he get a new brace for his left leg, where he developed foot drop.  Blood pressure 102/68 on Norvasc 10 mg daily, carvedilol 6.5-mg tablets b.i.d., Lasix 20 daily.  He states he feels no appetite and has lost some weight.  No nausea, vomiting, diarrhea.  He also states he sits or chills.  In the afternoon, but no fever.   Review of Systems General an metabolic cardiovascular, etc. review of systems is negative   Objective:   Physical Exam  A well-developed, well-nourished man in acute distress      Assessment & Plan:  Decreased appetite, chills, hypotension, unknown etiology.  Plan check labs, decrease Norvasc to 5 mg daily follow-up in two weeks.  Chronic pain continue Percocet 10 mg t.i.d.

## 2011-05-21 ENCOUNTER — Ambulatory Visit (INDEPENDENT_AMBULATORY_CARE_PROVIDER_SITE_OTHER): Payer: Federal, State, Local not specified - PPO | Admitting: Family Medicine

## 2011-05-21 ENCOUNTER — Encounter: Payer: Self-pay | Admitting: Family Medicine

## 2011-05-21 VITALS — BP 110/68 | Temp 97.5°F | Wt 205.0 lb

## 2011-05-21 DIAGNOSIS — Z23 Encounter for immunization: Secondary | ICD-10-CM

## 2011-05-21 DIAGNOSIS — I10 Essential (primary) hypertension: Secondary | ICD-10-CM

## 2011-05-21 NOTE — Patient Instructions (Signed)
Continue the 5 mg of Norvasc daily, along with her other medications.  Return in one month for your annual physical

## 2011-05-21 NOTE — Progress Notes (Signed)
  Subjective:    Patient ID: Kristopher Wilson, male    DOB: 1936-05-15, 75 y.o.   MRN: [id number]  HPIBob is a 75 year old male, who comes in today for reevaluation of hypertension.  He currently takes Norvasc 5 mg a day................ We cut the dose back to 10 because his blood pressure was too low............Marland Kitchen And Cory 6.25 mg b.i.d., Lasix 20 mg every other day, and 110 mEq potassium tablet every other day.  BP by me 126/60    Review of Systems General and cardiovascular is systems otherwise negative.  He is not lightheaded when he stands up    Objective:   Physical Exam Well-developed well-nourished, male in no acute distress.  BP left arm sitting position 126/60       Assessment & Plan:  Hypertension the goal continue current medications follow-up for physical next month

## 2011-05-28 ENCOUNTER — Ambulatory Visit (INDEPENDENT_AMBULATORY_CARE_PROVIDER_SITE_OTHER): Payer: Federal, State, Local not specified - PPO | Admitting: Cardiology

## 2011-05-28 ENCOUNTER — Encounter: Payer: Self-pay | Admitting: Cardiology

## 2011-05-28 DIAGNOSIS — R079 Chest pain, unspecified: Secondary | ICD-10-CM

## 2011-05-28 DIAGNOSIS — I10 Essential (primary) hypertension: Secondary | ICD-10-CM

## 2011-05-28 DIAGNOSIS — I2510 Atherosclerotic heart disease of native coronary artery without angina pectoris: Secondary | ICD-10-CM

## 2011-05-28 DIAGNOSIS — E785 Hyperlipidemia, unspecified: Secondary | ICD-10-CM

## 2011-05-28 NOTE — Patient Instructions (Signed)
Take fish oil 1000mg  twice a day.  Your physician has requested that you have en exercise stress myoview. For further information please visit [email or web address] Please follow instruction sheet, as given.  Take and record your blood pressure about 2 hours after you take your medication. I will call you in 2 weeks to get the readings. Luana Shu [phone]  Your physician wants you to follow-up in: 6 months with Dr Shirlee Latch. (March 2013). You will receive a reminder letter in the mail two months in advance. If you don't receive a letter, please call our office to schedule the follow-up appointment.

## 2011-05-31 NOTE — Assessment & Plan Note (Signed)
LDL is at goal.  Triglycerides are high.  I advised her to take fish oil 2000 mg daily.

## 2011-05-31 NOTE — Assessment & Plan Note (Signed)
Atypical chest pain, but has been occurring fairly commonly.  He is worried given prior coronary disease.  I will arrange for an ETT-myoview.  He will continue ASA 81, Plavix, Coreg, and Crestor.

## 2011-05-31 NOTE — Assessment & Plan Note (Signed)
BP is mildly elevated today.  He will check it daily at home and we will call in 2 weeks for his readings.

## 2011-05-31 NOTE — Progress Notes (Signed)
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  Patient had PCI in 2000 with BMS to LAD and repeat PCI in 2009 with DES to PLV.  Given some atypical chest pain, patient had a Lexiscan myoview done in 12/11.  This showed no evidence for ischemia or infarction.  Echo showed normal LV systolic function.  He his activity continues to be limited by a left leg neuropathy that has been thought to be due to post-herpetic neuralgia.  He has been seen by neurology for this. He is wearing an orthotic on the left leg because of foot drop.    Mr Kristopher Wilson reports today that he has had increased chest pain episodes recently.  This is an aching in his central chest without exertion.  There is no particular pattern.  It can anywhere from 5 minutes to 45 minutes.  It is not related to meals.  It occurs 2-3 times a week.  It seems somewhat different than prior ischemic pain, that radiated to his neck.  He has had chronic dyspnea when walking up steps or up a hill.  He golfs frequently.    BP is elevated today at 144/79.   Labs (9/11): K 4.4, creatinine 1.4, TSH normal, LDL 33, HDL 41 Labs (12/11): BNP 48 => 190, creatinine 2.4 => 1.3, K 4.1 Labs (8/12): K 4.2, creatinine 1.2, HDL 38.5, LDL 41, TGs 285, TSH normal  ECG: NSR, normal  Allergies:  1)  ! Keflex  Past Medical History: 1. CORONARY ARTERY DISEASE: PCI 7/00 with BMS to LAD.  PCI 2009 with Promus DES to PLV.  Lexiscan myoview (12/11): no evidence for ischemia or infarction, EF 67%.    2. HYPERTENSION  3. VENOUS INSUFFICIENCY 4. POSTHERPETIC NEURALGIA: affecting left lower leg.  5. COLONIC POLYPS, ADENOMATOUS, HX OF 6. ULCERATIVE COLITIS, LEFT SIDED  7. BACK PAIN, LUMBAR  8. URTICARIA, ALLERGIC 9. RESTLESS LEG SYNDROME 10. GERD 11. BENIGN PAROXYSMAL POSITIONAL VERTIGO  12. HYPERTHYROIDISM 13. PROSTATE CANCER: s/p prostatectomy 14. Hyperlipidemia 15. CKD 16.  Diastolic CHF: Echo (11/91) EF 55-60%, mild LAE.  BNP 190 in 12/11.   Family History: Family History High cholesterol Family History Hypertension No FH of Colon Cancer: Father died MI age 38 Throat Cancer-Brother died age 41 MS-brother and son Family History of Heart Disease: Father with MI at 11 Family History of Diabetes: Uncle  Social History: Retired  from [employer].  Now works at [employer] course Married Former Smoker-stopped 35 years ago Alcohol use-yes-2 drinks per day Drug use-no  ROS: All systems reviewed and negative except as per HPI.   Current Outpatient Prescriptions  Medication Sig Dispense Refill  . amLODipine (NORVASC) 10 MG tablet 1/2 tab po qd      . aspirin 81 MG tablet Take 81 mg by mouth daily.        . carvedilol (COREG) 6.25 MG tablet Take 6.25 mg by mouth 2 (two) times daily with a meal.        . clopidogrel (PLAVIX) 75 MG tablet Take 75 mg by mouth daily.        Marland Kitchen ezetimibe (ZETIA) 10 MG tablet Take 10 mg by mouth daily.        . furosemide (LASIX) 20 MG tablet Take 20 mg by mouth every other day.        Marland Kitchen KLOR-CON M10 10 MEQ tablet Take 10 mEq by mouth every other day.       Marland Kitchen  Mesalamine (ASACOL HD) 800 MG TBEC Take 2 tablets by mouth 3 (three) times daily.       . nitroGLYCERIN (NITROSTAT) 0.4 MG SL tablet Place 0.4 mg under the tongue every 5 (five) minutes as needed.        Marland Kitchen oxyCODONE-acetaminophen (PERCOCET) 10-650 MG per tablet Take 1 tablet by mouth 3 (three) times daily.  200 tablet  0  . pantoprazole (PROTONIX) 40 MG tablet Take 40 mg by mouth daily.        . rosuvastatin (CRESTOR) 20 MG tablet Take 20 mg by mouth daily.        . Omega-3 Fatty Acids (FISH OIL CONCENTRATE) 1000 MG CAPS Take two daily    0    BP 144/79  Pulse 61  Resp 14  Ht 6' 4"  (1.93 m)  Wt 200 lb (90.719 kg)  BMI 24.34 kg/m2 General:  Well developed, well nourished, in no acute distress. Neck:  Neck supple, no JVD. No masses, thyromegaly or abnormal  cervical nodes. Lungs:  Clear bilaterally to auscultation and percussion. Heart:  Non-displaced PMI, chest non-tender; regular rate and rhythm, S1, S2 without murmurs, rubs or gallops. Carotid upstroke normal, no bruit.  Pedals normal pulses. 1+ right ankle edema.  Abdomen:  Bowel sounds positive; abdomen soft and non-tender without masses, organomegaly, or hernias noted. No hepatosplenomegaly. Extremities:  No clubbing or cyanosis. Neurologic:  Alert and oriented x 3. Psych:  Normal affect.

## 2011-06-04 LAB — BASIC METABOLIC PANEL
BUN: 16
CO2: 25
Chloride: 109
Creatinine, Ser: 1.09
Glucose, Bld: 92
Potassium: 4

## 2011-06-04 LAB — MAGNESIUM: Magnesium: 2.2

## 2011-06-04 LAB — COMPREHENSIVE METABOLIC PANEL
ALT: 24
AST: 23
Albumin: 4.1
Calcium: 9
Creatinine, Ser: 1.24
GFR calc Af Amer: >60
Sodium: 140
Total Protein: 6.4

## 2011-06-04 LAB — CARDIAC PANEL(CRET KIN+CKTOT+MB+TROPI)
CK, MB: 5.3 — ABNORMAL HIGH
Relative Index: INVALID
Total CK: 113
Total CK: 86
Troponin I: 0.21 — ABNORMAL HIGH
Troponin I: 0.25 — ABNORMAL HIGH
Troponin I: 0.28 — ABNORMAL HIGH

## 2011-06-04 LAB — CBC
HCT: 37.2 — ABNORMAL LOW
MCHC: 33.8
MCHC: 34.7
MCV: 88.5
MCV: 88.8
Platelets: 187
Platelets: 214
RBC: 4.73
RDW: 13.9

## 2011-06-04 LAB — TROPONIN I: Troponin I: 0.28 — ABNORMAL HIGH

## 2011-06-04 LAB — LIPID PANEL
Cholesterol: 132
HDL: 43
Total CHOL/HDL Ratio: 3.1

## 2011-06-04 LAB — DIFFERENTIAL
Eosinophils Absolute: 0.2
Eosinophils Relative: 3
Lymphocytes Relative: 25
Lymphs Abs: 1.5
Monocytes Absolute: 0.4
Monocytes Relative: 7

## 2011-06-04 LAB — PLATELET COUNT: Platelets: 193

## 2011-06-04 LAB — B-NATRIURETIC PEPTIDE (CONVERTED LAB): Pro B Natriuretic peptide (BNP): 40

## 2011-06-04 LAB — CK TOTAL AND CKMB (NOT AT ARMC): Relative Index: INVALID

## 2011-06-04 LAB — APTT: aPTT: 28

## 2011-06-09 ENCOUNTER — Ambulatory Visit (HOSPITAL_COMMUNITY): Payer: Federal, State, Local not specified - PPO | Attending: Cardiology | Admitting: Radiology

## 2011-06-09 DIAGNOSIS — R0609 Other forms of dyspnea: Secondary | ICD-10-CM

## 2011-06-09 DIAGNOSIS — R0989 Other specified symptoms and signs involving the circulatory and respiratory systems: Secondary | ICD-10-CM

## 2011-06-09 DIAGNOSIS — R079 Chest pain, unspecified: Secondary | ICD-10-CM | POA: Insufficient documentation

## 2011-06-09 DIAGNOSIS — I2510 Atherosclerotic heart disease of native coronary artery without angina pectoris: Secondary | ICD-10-CM | POA: Insufficient documentation

## 2011-06-09 MED ORDER — TECHNETIUM TC 99M TETROFOSMIN IV KIT
33.0000 | PACK | Freq: Once | INTRAVENOUS | Status: AC | PRN
  Administered 2011-06-09: 33 via INTRAVENOUS

## 2011-06-09 MED ORDER — TECHNETIUM TC 99M TETROFOSMIN IV KIT
11.0000 | PACK | Freq: Once | INTRAVENOUS | Status: AC | PRN
  Administered 2011-06-09: 11 via INTRAVENOUS

## 2011-06-09 NOTE — Progress Notes (Signed)
[facility] SITE 3 NUCLEAR MED [address] [place] [address] [phone]  Cardiology Nuclear Med Study  Kristopher Wilson is a 75 y.o. male [id number] 02-12-1936   Nuclear Med Background Indication for Stress Test:  Evaluation for Ischemia, Stent Patency and PTCA Patency History: '09 Angioplasty, 12/11 Echo: EF 55-65%, 06/09 Heart Catheterization: patent stent, 12/11 Myocardial Perfusion Study: EF 67% and 06/09 Stents:LAD- PLS stent Cardiac Risk Factors: Family History - CAD, History of Smoking, Hypertension, Lipids and PVD  Symptoms:  Chest Pain and DOE   Nuclear Pre-Procedure Caffeine/Decaff Intake:  None NPO After: 7:00pm   Lungs: clear IV 0.9% NS with Angio Cath:  20g  IV Site: R Wrist  IV Started by:  Cathlyn Parsons, RN  Chest Size (in):  43 Cup Size: n/a  Height: 6\' 2"  (1.88 m)  Weight:  198 lb (89.812 kg)  BMI:  Body mass index is 25.42 kg/(m^2). Tech Comments:  Coreg held x 24 hrs    Nuclear Med Study 1 or 2 day study: 1 day  Stress Test Type:  Stress  Reading MD: Willa Rough, MD  Order Authorizing Provider:  D.McLean  Resting Radionuclide: Technetium 79m Tetrofosmin  Resting Radionuclide Dose: 11.0 mCi   Stress Radionuclide:  Technetium 67m Tetrofosmin  Stress Radionuclide Dose: 33.0 mCi           Stress Protocol Rest HR: 61 Stress HR: 134  Rest BP: 113/72 Stress BP: 188/67  Exercise Time (min): 5:00 METS: 7.0   Predicted Max HR: 147 bpm % Max HR: 91.16 bpm Rate Pressure Product: 46962   Dose of Adenosine (mg):  n/a Dose of Lexiscan: n/a mg  Dose of Atropine (mg): n/a Dose of Dobutamine: n/a mcg/kg/min (at max HR)  Stress Test Technologist: Milana Na, EMT-P  Nuclear Technologist:  Domenic Polite, CNMT     Rest Procedure:  Myocardial perfusion imaging was performed at rest 45 minutes following the intravenous administration of Technetium 73m Tetrofosmin. Rest ECG: Sinus Bradycardia  Stress Procedure:  The patient  exercised for 5:00.  The patient stopped due to fatigue and denied any chest pain.  There were no significant ST-T wave changes.  Technetium 34m Tetrofosmin was injected at peak exercise and myocardial perfusion imaging was performed after a brief delay. Stress ECG: No significant ST segment change suggestive of ischemia.  QPS Raw Data Images:  Patient motion noted; appropriate software correction applied. Stress Images:  Mild decrease in activity in the inferior wall. Rest Images:  Similar to stress. Subtraction (SDS):  No evidence of ischemia. Transient Ischemic Dilatation (Normal <1.22):  0.92 Lung/Heart Ratio (Normal <0.45):  0.31  Quantitative Gated Spect Images QGS EDV:  88 ml QGS ESV:  31 ml QGS cine images:  Normal Wall Motion QGS EF: 65%  Impression Exercise Capacity:  Fair exercise capacity. BP Response:  Normal blood pressure response. Clinical Symptoms:  No chest pain. ECG Impression:  No significant ST segment change suggestive of ischemia. Comparison with Prior Nuclear Study: No significant change from previous study  Overall Impression:  Normal stress nuclear study.  Diaphragmatic attenuation.  Willa Rough

## 2011-06-10 ENCOUNTER — Telehealth: Payer: Self-pay | Admitting: Cardiology

## 2011-06-10 NOTE — Telephone Encounter (Signed)
Walk In Pt Form " Pt Dropped off BP Readings" for [facility] sent to [employer]   06/10/11/km

## 2011-06-12 NOTE — Progress Notes (Signed)
Normal study, no change from prior.  Please inform patient.   Kristopher Wilson

## 2011-06-15 ENCOUNTER — Telehealth: Payer: Self-pay | Admitting: Cardiology

## 2011-06-15 NOTE — Progress Notes (Signed)
LMTCB

## 2011-06-15 NOTE — Telephone Encounter (Signed)
Patient calling returning call back to nurse.

## 2011-06-15 NOTE — Progress Notes (Signed)
Pt notified of results

## 2011-06-15 NOTE — Telephone Encounter (Signed)
I talked with pt about test results.

## 2011-06-17 ENCOUNTER — Ambulatory Visit: Payer: Federal, State, Local not specified - PPO | Admitting: Cardiology

## 2011-06-17 ENCOUNTER — Telehealth: Payer: Self-pay | Admitting: *Deleted

## 2011-06-17 NOTE — Telephone Encounter (Signed)
Dr Aundra Dubin reviewed BP readings brought in by pt. Dr Aundra Dubin said they looked good for the most part and did not have any new recommendations. I talked with pt. BP readings brought in by pt have been forwarded to HIM to be scanned into EPIC.

## 2011-06-25 ENCOUNTER — Other Ambulatory Visit (INDEPENDENT_AMBULATORY_CARE_PROVIDER_SITE_OTHER): Payer: Federal, State, Local not specified - PPO

## 2011-06-25 DIAGNOSIS — Z0000 Encounter for general adult medical examination without abnormal findings: Secondary | ICD-10-CM

## 2011-06-25 LAB — POCT URINALYSIS DIPSTICK
Bilirubin, UA: NEGATIVE
Blood, UA: NEGATIVE
Glucose, UA: NEGATIVE
Ketones, UA: NEGATIVE
Spec Grav, UA: 1.03

## 2011-06-25 LAB — CBC WITH DIFFERENTIAL/PLATELET
Basophils Absolute: 0 10*3/uL (ref 0.0–0.1)
Basophils Relative: 0.4 % (ref 0.0–3.0)
Eosinophils Absolute: 0.2 10*3/uL (ref 0.0–0.7)
Lymphocytes Relative: 38.2 % (ref 12.0–46.0)
MCHC: 33.3 g/dL (ref 30.0–36.0)
MCV: 88.9 fl (ref 78.0–100.0)
Monocytes Absolute: 0.3 10*3/uL (ref 0.1–1.0)
Neutro Abs: 2.4 10*3/uL (ref 1.4–7.7)
Neutrophils Relative %: 50.4 % (ref 43.0–77.0)
RBC: 4.46 Mil/uL (ref 4.22–5.81)
RDW: 14 % (ref 11.5–14.6)

## 2011-06-25 LAB — BASIC METABOLIC PANEL
CO2: 28 mEq/L (ref 19–32)
Calcium: 9.2 mg/dL (ref 8.4–10.5)
Chloride: 107 mEq/L (ref 96–112)
Creatinine, Ser: 1.2 mg/dL (ref 0.4–1.5)
Glucose, Bld: 104 mg/dL — ABNORMAL HIGH (ref 70–99)

## 2011-06-25 LAB — LIPID PANEL
Cholesterol: 121 mg/dL (ref 0–200)
HDL: 43.7 mg/dL (ref >39.00)
Triglycerides: 177 mg/dL — ABNORMAL HIGH (ref 0.0–149.0)

## 2011-06-25 LAB — HEPATIC FUNCTION PANEL
Albumin: 4.3 g/dL (ref 3.5–5.2)
Total Protein: 6.4 g/dL (ref 6.0–8.3)

## 2011-07-07 ENCOUNTER — Other Ambulatory Visit: Payer: Self-pay | Admitting: *Deleted

## 2011-07-07 ENCOUNTER — Ambulatory Visit (INDEPENDENT_AMBULATORY_CARE_PROVIDER_SITE_OTHER): Payer: Federal, State, Local not specified - PPO | Admitting: Family Medicine

## 2011-07-07 ENCOUNTER — Encounter: Payer: Self-pay | Admitting: Family Medicine

## 2011-07-07 VITALS — BP 108/68 | Temp 97.7°F | Ht 75.0 in | Wt 204.0 lb

## 2011-07-07 DIAGNOSIS — B0229 Other postherpetic nervous system involvement: Secondary | ICD-10-CM

## 2011-07-07 DIAGNOSIS — E785 Hyperlipidemia, unspecified: Secondary | ICD-10-CM

## 2011-07-07 DIAGNOSIS — Z23 Encounter for immunization: Secondary | ICD-10-CM

## 2011-07-07 DIAGNOSIS — Z8546 Personal history of malignant neoplasm of prostate: Secondary | ICD-10-CM

## 2011-07-07 DIAGNOSIS — G609 Hereditary and idiopathic neuropathy, unspecified: Secondary | ICD-10-CM

## 2011-07-07 DIAGNOSIS — I10 Essential (primary) hypertension: Secondary | ICD-10-CM

## 2011-07-07 MED ORDER — CARVEDILOL 6.25 MG PO TABS: 6.2500 mg | ORAL_TABLET | Freq: Two times a day (BID) | ORAL | Status: DC

## 2011-07-07 MED ORDER — NITROGLYCERIN 0.4 MG SL SUBL: 0.4000 mg | SUBLINGUAL_TABLET | SUBLINGUAL | Status: DC | PRN

## 2011-07-07 MED ORDER — EZETIMIBE 10 MG PO TABS: 10.0000 mg | ORAL_TABLET | Freq: Every day | ORAL | Status: DC

## 2011-07-07 MED ORDER — CLOPIDOGREL BISULFATE 75 MG PO TABS: 75.0000 mg | ORAL_TABLET | Freq: Every day | ORAL | Status: DC

## 2011-07-07 MED ORDER — PANTOPRAZOLE SODIUM 40 MG PO TBEC: 40.0000 mg | DELAYED_RELEASE_TABLET | Freq: Every day | ORAL | Status: DC

## 2011-07-07 MED ORDER — POTASSIUM CHLORIDE CRYS ER 10 MEQ PO TBCR: 10.0000 meq | EXTENDED_RELEASE_TABLET | ORAL | Status: DC

## 2011-07-07 MED ORDER — ROSUVASTATIN CALCIUM 20 MG PO TABS: 20.0000 mg | ORAL_TABLET | Freq: Every day | ORAL | Status: DC

## 2011-07-07 MED ORDER — FUROSEMIDE 20 MG PO TABS: 20.0000 mg | ORAL_TABLET | ORAL | Status: DC

## 2011-07-07 MED ORDER — AMLODIPINE BESYLATE 5 MG PO TABS: 5.0000 mg | ORAL_TABLET | Freq: Every day | ORAL | Status: DC

## 2011-07-07 MED ORDER — SCOPOLAMINE 1 MG/3DAYS TD PT72: 1.0000 | MEDICATED_PATCH | TRANSDERMAL | Status: DC

## 2011-07-07 NOTE — Telephone Encounter (Signed)
Refills should have been sent to mail order

## 2011-07-07 NOTE — Patient Instructions (Signed)
Continue your current excellent health habits.  Return in one year, sooner if any problems.  Call about two weeks from being out of your Percocet prescription for refills.  The Transderm-Scop patches for motion sickness, one every 3 days

## 2011-07-07 NOTE — Progress Notes (Signed)
  Subjective:    Patient ID: Kristopher Wilson, male    DOB: 1935-09-28, 75 y.o.   MRN: [id number]  HPIBob is a 75 year old, married male, nonsmoker, who comes in today for Medicare wellness examination.  He has a history of hypertension, coronary artery disease, hyperlipidemia, venous insufficiency, reflux esophagitis, hyperlipidemia, and chronic pain secondary to shingles.  His medications were reviewed in detail and there have been no changes.  See medication list.  He gets routine eye care, vision, normal, hearing normal, regular dental care, activities of daily living, normal.  Walks on a regular basis, despite his pain, cognitive function, normal, home health safety reviewed.  New issues identified, no bands in the house, healthcare power of attorney and living will done, tetanus, 2007, seasonal flu 2012, Pneumovax done, shingles.  Vaccine given, however, he developed shingles, despite the vaccination.  He is currently taking Percocet one to 3 tablets daily for chronic pain syndrome    Review of Systems  Constitutional: Negative.   HENT: Negative.   Eyes: Negative.   Respiratory: Negative.   Cardiovascular: Negative.   Gastrointestinal: Negative.   Genitourinary: Negative.   Musculoskeletal: Negative.   Skin: Negative.   Neurological: Negative.   Hematological: Negative.   Psychiatric/Behavioral: Negative.        Objective:   Physical Exam  Constitutional: He is oriented to person, place, and time. He appears well-developed and well-nourished.  HENT:  Head: Normocephalic and atraumatic.  Right Ear: External ear normal.  Left Ear: External ear normal.  Nose: Nose normal.  Mouth/Throat: Oropharynx is clear and moist.  Eyes: Conjunctivae and EOM are normal. Pupils are equal, round, and reactive to light.  Neck: Normal range of motion. Neck supple. No JVD present. No tracheal deviation present. No thyromegaly present.  Cardiovascular: Normal rate, regular rhythm, normal heart  sounds and intact distal pulses.  Exam reveals no gallop and no friction rub.   No murmur heard. Pulmonary/Chest: Effort normal and breath sounds normal. No stridor. No respiratory distress. He has no wheezes. He has no rales. He exhibits no tenderness.  Abdominal: Soft. Bowel sounds are normal. He exhibits no distension and no mass. There is no tenderness. There is no rebound and no guarding.  Genitourinary: Rectum normal and penis normal. Guaiac negative stool. No penile tenderness.       Prostate surgically removed for cancer  Musculoskeletal: Normal range of motion. He exhibits no edema and no tenderness.       Neuropathy left lower extremity, from the shingles  Lymphadenopathy:    He has no cervical adenopathy.  Neurological: He is alert and oriented to person, place, and time. He has normal reflexes. No cranial nerve deficit. He exhibits normal muscle tone.  Skin: Skin is warm and dry. No rash noted. No erythema. No pallor.  Psychiatric: He has a normal mood and affect. His behavior is normal. Judgment and thought content normal.          Assessment & Plan:  Healthy male.  History of hypertension.  Continue current medication.  Coronary artery disease.  Continue current medication asymptomatic.  History of inflammatory bowel disease.  Continue Asacol per GI.  Reflux esophagitis.  Continue protonic.  History of hyperlipidemia.  Continue Crestor.  Chronic pain syndrome, and left lower extremity neuropathy, secondary to shingles continue the splint, and the Percocet.  Return in one year, sooner if any problems

## 2011-07-23 ENCOUNTER — Encounter: Payer: Self-pay | Admitting: Internal Medicine

## 2011-08-06 NOTE — Telephone Encounter (Signed)
Please close this encounter

## 2011-08-13 ENCOUNTER — Other Ambulatory Visit: Payer: Self-pay | Admitting: Family Medicine

## 2011-08-13 DIAGNOSIS — B0229 Other postherpetic nervous system involvement: Secondary | ICD-10-CM

## 2011-08-13 DIAGNOSIS — G609 Hereditary and idiopathic neuropathy, unspecified: Secondary | ICD-10-CM

## 2011-08-13 NOTE — Telephone Encounter (Signed)
ok 

## 2011-08-13 NOTE — Telephone Encounter (Signed)
Pt last seen 07/07/11.  Pls advise.

## 2011-08-13 NOTE — Telephone Encounter (Signed)
Pt called req refill of oxyCODONE-acetaminophen (PERCOCET) 10-650 MG per tablet

## 2011-08-14 MED ORDER — OXYCODONE-ACETAMINOPHEN 10-650 MG PO TABS: 1.0000 | ORAL_TABLET | Freq: Three times a day (TID) | ORAL | Status: DC

## 2011-08-14 NOTE — Telephone Encounter (Signed)
rx ready for pick up

## 2011-09-29 ENCOUNTER — Telehealth: Payer: Self-pay | Admitting: Family Medicine

## 2011-09-29 DIAGNOSIS — G609 Hereditary and idiopathic neuropathy, unspecified: Secondary | ICD-10-CM

## 2011-09-29 DIAGNOSIS — I10 Essential (primary) hypertension: Secondary | ICD-10-CM

## 2011-09-29 DIAGNOSIS — Z23 Encounter for immunization: Secondary | ICD-10-CM

## 2011-09-29 DIAGNOSIS — Z8546 Personal history of malignant neoplasm of prostate: Secondary | ICD-10-CM

## 2011-09-29 DIAGNOSIS — B0229 Other postherpetic nervous system involvement: Secondary | ICD-10-CM

## 2011-09-29 DIAGNOSIS — E785 Hyperlipidemia, unspecified: Secondary | ICD-10-CM

## 2011-09-29 MED ORDER — PANTOPRAZOLE SODIUM 40 MG PO TBEC: 40.0000 mg | DELAYED_RELEASE_TABLET | Freq: Every day | ORAL | Status: DC

## 2011-09-29 NOTE — Telephone Encounter (Signed)
rx resent with #[id number]

## 2011-09-29 NOTE — Telephone Encounter (Signed)
Kristopher Wilson, I received a prior auth request on the pt's protonix. I can't do it, because I see it was filled for 100 tabs for 90 days. I don't think they're going to let me request more than 90 for 90. Can you dfix this and resubmit, then we'll see what happens?

## 2011-10-09 ENCOUNTER — Ambulatory Visit: Payer: Federal, State, Local not specified - PPO | Admitting: Cardiology

## 2011-10-13 ENCOUNTER — Encounter: Payer: Self-pay | Admitting: Internal Medicine

## 2011-10-13 ENCOUNTER — Ambulatory Visit (INDEPENDENT_AMBULATORY_CARE_PROVIDER_SITE_OTHER): Payer: Federal, State, Local not specified - PPO | Admitting: Internal Medicine

## 2011-10-13 DIAGNOSIS — K5150 Left sided colitis without complications: Secondary | ICD-10-CM

## 2011-10-13 DIAGNOSIS — Z86010 Personal history of colonic polyps: Secondary | ICD-10-CM

## 2011-10-13 DIAGNOSIS — K219 Gastro-esophageal reflux disease without esophagitis: Secondary | ICD-10-CM

## 2011-10-13 MED ORDER — MESALAMINE 800 MG PO TBEC: 2.0000 | DELAYED_RELEASE_TABLET | Freq: Three times a day (TID) | ORAL | Status: DC

## 2011-10-13 NOTE — Patient Instructions (Signed)
Your prescription(s) has(have) been sent to your mail order pharmacy ([facility]) . You will be receiving a letter from our office around September 2013 for your Colonoscopy recall.

## 2011-10-13 NOTE — Progress Notes (Signed)
Subjective:    Patient ID: Kristopher Wilson, male    DOB: Jun 18, 1936, 76 y.o.   MRN: [id number]  HPI This 76 year old white man is seen today for left ulcerative colitis, history of adenomatous polyps and GERD. He having no diarrhea, abdominal pain or rectal bleeding. He has no heartburn on pantoprazole. He is coming up on a 3 year anniversary from a colonoscopy in which he had his left-sided ulcerative colitis evaluated and had a diminutive adenomatous polyp. Allergies  Allergen Reactions  . Cephalexin     REACTION: hives   Outpatient Prescriptions Prior to Visit  Medication Sig Dispense Refill  . amLODipine (NORVASC) 5 MG tablet Take 1 tablet (5 mg total) by mouth daily.  100 tablet  3  . aspirin 81 MG tablet Take 81 mg by mouth daily.        . carvedilol (COREG) 6.25 MG tablet Take 1 tablet (6.25 mg total) by mouth 2 (two) times daily with a meal.  100 tablet  3  . clopidogrel (PLAVIX) 75 MG tablet Take 1 tablet (75 mg total) by mouth daily.  100 tablet  3  . ezetimibe (ZETIA) 10 MG tablet Take 1 tablet (10 mg total) by mouth daily.  100 tablet  3  . furosemide (LASIX) 20 MG tablet Take 1 tablet (20 mg total) by mouth every other day.  100 tablet  3  . nitroGLYCERIN (NITROSTAT) 0.4 MG SL tablet Place 1 tablet (0.4 mg total) under the tongue every 5 (five) minutes as needed.  25 tablet  1  . Omega-3 Fatty Acids (FISH OIL CONCENTRATE) 1000 MG CAPS Take two daily    0  . oxyCODONE-acetaminophen (PERCOCET) 10-650 MG per tablet Take 1 tablet by mouth 3 (three) times daily.  200 tablet  0  . pantoprazole (PROTONIX) 40 MG tablet Take 1 tablet (40 mg total) by mouth daily.  90 tablet  3  . potassium chloride (KLOR-CON M10) 10 MEQ tablet Take 1 tablet (10 mEq total) by mouth every other day.  100 tablet  2  . rosuvastatin (CRESTOR) 20 MG tablet Take 1 tablet (20 mg total) by mouth daily.  100 tablet  3  . Mesalamine (ASACOL HD) 800 MG TBEC Take 2 tablets by mouth 3 (three) times daily.       Marland Kitchen  scopolamine (TRANSDERM-SCOP) 1.5 MG Place 1 patch (1.5 mg total) onto the skin every 3 (three) days.  4 patch  1   Past Medical History  Diagnosis Date  . CAD (coronary artery disease)   . Hypertension   . Ulcer   . GERD (gastroesophageal reflux disease)   . Hyperthyroidism   . Hyperlipidemia   . RLS (restless legs syndrome)   . CKD (chronic kidney disease)   . CHF (congestive heart failure)   . VENOUS INSUFFICIENCY 07/28/2010  . PERIPHERAL NEUROPATHY 10/11/2007  . COLONIC POLYPS, ADENOMATOUS, HX OF 12/27/2009  . ULCERATIVE COLITIS, LEFT SIDED 12/27/2009  . BACK PAIN, LUMBAR 10/18/2009  . URTICARIA, ALLERGIC 08/05/2009  . Benign paroxysmal positional vertigo 09/30/2007  . 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 PROSTATE CANCER, HX OF 07/07/2007    s/p prostatectomy       Past Surgical History  Procedure Date  . Tonsillectomy   . Angioplasty     stent placement 2  . Inguinal hernia repair   . Prostatectomy   . Colonoscopy w/ biopsies and polypectomy 06/06/2009    adenomatous polyp, diverticulosis, colitis   History   Social History  . Marital  Status: Married            .     Occupational History  . retired   . [employer]    Social History Main Topics  . Smoking status: Former Smoker    Quit date: 09/08/1975  . Smokeless tobacco: Never Used  . Alcohol Use: 7.0 oz/week    14 drink(s) per week     1-2 "high balls" daily  . Drug Use: No    Family History  Problem Relation Age of Onset  . Heart attack Father   . Throat cancer Brother   . Multiple sclerosis Brother   . Multiple sclerosis Son   . Diabetes Maternal Uncle     x 2  . Colon cancer Neg Hx         Review of Systems As above    Objective:   Physical Exam General:  NAD Eyes:   anicteric Lungs:  clear Heart:  S1S2 no rubs, murmurs or gallops Abdomen:  soft and nontender, BS+     Data Reviewed:  Lab Results  Component Value Date   WBC 4.7 06/25/2011   HGB 13.2 06/25/2011   HCT  39.7 06/25/2011   MCV 88.9 06/25/2011   PLT 208.0 06/25/2011           Assessment & Plan:

## 2011-10-13 NOTE — Assessment & Plan Note (Signed)
Asymptomatic on pantoprazole - no changes

## 2011-10-13 NOTE — Assessment & Plan Note (Signed)
Plan for routine screening/surveillance colonoscopy (UC and hx adenomas) Fall 2013

## 2011-10-13 NOTE — Assessment & Plan Note (Signed)
Asymptomatic - continue 4.8 g mesalamine daily Colonoscopy Fall 2013 - for hx adenomas and the colitis

## 2011-10-14 ENCOUNTER — Ambulatory Visit: Payer: Federal, State, Local not specified - PPO | Admitting: Cardiology

## 2011-10-14 ENCOUNTER — Encounter: Payer: Self-pay | Admitting: Cardiology

## 2011-10-14 ENCOUNTER — Ambulatory Visit (INDEPENDENT_AMBULATORY_CARE_PROVIDER_SITE_OTHER): Payer: Federal, State, Local not specified - PPO | Admitting: Cardiology

## 2011-10-14 DIAGNOSIS — I10 Essential (primary) hypertension: Secondary | ICD-10-CM

## 2011-10-14 DIAGNOSIS — E785 Hyperlipidemia, unspecified: Secondary | ICD-10-CM

## 2011-10-14 DIAGNOSIS — I2510 Atherosclerotic heart disease of native coronary artery without angina pectoris: Secondary | ICD-10-CM

## 2011-10-14 NOTE — Assessment & Plan Note (Signed)
BP now under good control.    Kristopher Wilson will followup in 1 year.

## 2011-10-14 NOTE — Assessment & Plan Note (Signed)
No recent chest pain.  Some atypical chest pain in 12/12 similar to the pain that prompted his normal stress test in 6/12.  I suspect that this pain is noncardiac. Continue ASA 81, Plavix, Coreg, statin.

## 2011-10-14 NOTE — Patient Instructions (Signed)
Your physician wants you to follow-up in: 1 year with Dr McLean. (February 2014). You will receive a reminder letter in the mail two months in advance. If you don't receive a letter, please call our office to schedule the follow-up appointment.   

## 2011-10-14 NOTE — Assessment & Plan Note (Signed)
LDL at goal (< 70) in 10/12.

## 2011-10-14 NOTE — Progress Notes (Signed)
PCP: Dr. Sherren Mocha  76 yo with history of CAD, ulcerative colitis, and shingles with post-herpetic neuralgia presents for cardiology followup.  Patient had PCI in 2000 with BMS to LAD and repeat PCI in 2009 with DES to PLV.  He had atypical chest pain this past summer and had an ETT-myoview in 6/12 showing no evidence for ischemia or infarction.  His activity continues to be limited by a left leg neuropathy that has been thought to be due to post-herpetic neuralgia.  He has been seen by neurology for this. He wears an orthotic on the left leg because of foot drop.  This has been making slow improvement.  Mr Loeffelholz has done well recently.  In December, he had some nonexertional chest pain similar to the pain that prompted the last myoview.  However, for the last 6-7 weeks he has had no problems.  No exertional chest pain or dyspnea.  He is playing some golf and works in his yard.    Labs (9/11): K 4.4, creatinine 1.4, TSH normal, LDL 33, HDL 41 Labs (12/11): BNP 48 => 190, creatinine 2.4 => 1.3, K 4.1 Labs (8/12): K 4.2, creatinine 1.2, HDL 38.5, LDL 41, TGs 285, TSH normal Labs (10/12): K 4.2, creatinine 1.2, LDL 42, HDL 44, TGs 177  Allergies:  1)  ! Keflex  Past Medical History: 1. CORONARY ARTERY DISEASE: PCI 7/00 with BMS to LAD.  PCI 2009 with Promus DES to PLV.  Lexiscan myoview (12/11): no evidence for ischemia or infarction, EF 67%.   Lexiscan myoview (6/12) with EF 65%, 5' exercise, no evidence for ischemia or infarction.  2. HYPERTENSION  3. VENOUS INSUFFICIENCY 4. POSTHERPETIC NEURALGIA: affecting left lower leg.  5. COLONIC POLYPS, ADENOMATOUS, HX OF 6. ULCERATIVE COLITIS, LEFT SIDED  7. BACK PAIN, LUMBAR  8. URTICARIA, ALLERGIC 9. RESTLESS LEG SYNDROME 10. GERD 11. BENIGN PAROXYSMAL POSITIONAL VERTIGO  12. HYPERTHYROIDISM 13. PROSTATE CANCER: s/p prostatectomy 14. Hyperlipidemia 15. CKD 16. Diastolic CHF: Echo (34/74) EF 55-60%, mild LAE.  BNP 190 in 12/11.   Family  History: Family History High cholesterol Family History Hypertension No FH of Colon Cancer: Father died MI age 53 Throat Cancer-Brother died age 65 MS-brother and son Family History of Heart Disease: Father with MI at 67 Family History of Diabetes: Uncle  Social History: Retired  from [employer].  Now works at [employer] course Married Former Smoker-stopped 35 years ago Alcohol use-yes-2 drinks per day Drug use-no   Current Outpatient Prescriptions  Medication Sig Dispense Refill  . amLODipine (NORVASC) 5 MG tablet Take 1 tablet (5 mg total) by mouth daily.  100 tablet  3  . aspirin 81 MG tablet Take 81 mg by mouth daily.        . carvedilol (COREG) 6.25 MG tablet Take 1 tablet (6.25 mg total) by mouth 2 (two) times daily with a meal.  100 tablet  3  . clopidogrel (PLAVIX) 75 MG tablet Take 1 tablet (75 mg total) by mouth daily.  100 tablet  3  . ezetimibe (ZETIA) 10 MG tablet Take 1 tablet (10 mg total) by mouth daily.  100 tablet  3  . furosemide (LASIX) 20 MG tablet Take 1 tablet (20 mg total) by mouth every other day.  100 tablet  3  . Mesalamine (ASACOL HD) 800 MG TBEC Take 2 tablets (1,600 mg total) by mouth 3 (three) times daily.  540 tablet  3  . nitroGLYCERIN (NITROSTAT) 0.4 MG SL tablet Place 1 tablet (  0.4 mg total) under the tongue every 5 (five) minutes as needed.  25 tablet  1  . Omega-3 Fatty Acids (FISH OIL CONCENTRATE) 1000 MG CAPS Take two daily    0  . oxyCODONE-acetaminophen (PERCOCET) 10-650 MG per tablet Take 1 tablet by mouth 3 (three) times daily.  200 tablet  0  . pantoprazole (PROTONIX) 40 MG tablet Take 1 tablet (40 mg total) by mouth daily.  90 tablet  3  . potassium chloride (KLOR-CON M10) 10 MEQ tablet Take 1 tablet (10 mEq total) by mouth every other day.  100 tablet  2  . rosuvastatin (CRESTOR) 20 MG tablet Take 1 tablet (20 mg total) by mouth daily.  100 tablet  3    BP 126/73  Pulse 67  Ht 6' 3"  (1.905 m)  Wt 95.709 kg (211 lb)   BMI 26.37 kg/m2 General:  Well developed, well nourished, in no acute distress. Neck:  Neck supple, no JVD. No masses, thyromegaly or abnormal cervical nodes. Lungs:  Clear bilaterally to auscultation and percussion. Heart:  Non-displaced PMI, chest non-tender; regular rate and rhythm, S1, S2 without murmurs, rubs or gallops. Carotid upstroke normal, no bruit.  Pedals normal pulses. 1+ right ankle edema.  Abdomen:  Bowel sounds positive; abdomen soft and non-tender without masses, organomegaly, or hernias noted. No hepatosplenomegaly. Extremities:  No clubbing or cyanosis. Neurologic:  Alert and oriented x 3. Psych:  Normal affect.

## 2011-11-16 ENCOUNTER — Other Ambulatory Visit: Payer: Self-pay | Admitting: Family Medicine

## 2011-11-16 DIAGNOSIS — G609 Hereditary and idiopathic neuropathy, unspecified: Secondary | ICD-10-CM

## 2011-11-16 DIAGNOSIS — B0229 Other postherpetic nervous system involvement: Secondary | ICD-10-CM

## 2011-11-16 MED ORDER — OXYCODONE-ACETAMINOPHEN 10-650 MG PO TABS: 1.0000 | ORAL_TABLET | Freq: Three times a day (TID) | ORAL | Status: DC

## 2011-11-16 NOTE — Telephone Encounter (Signed)
Rx ready for pick up and Left message on machine for patient

## 2011-11-16 NOTE — Telephone Encounter (Signed)
ok 

## 2011-11-16 NOTE — Telephone Encounter (Signed)
Pt need new rx percocet.

## 2012-03-02 ENCOUNTER — Telehealth: Payer: Self-pay | Admitting: Family Medicine

## 2012-03-02 DIAGNOSIS — G609 Hereditary and idiopathic neuropathy, unspecified: Secondary | ICD-10-CM

## 2012-03-02 DIAGNOSIS — B0229 Other postherpetic nervous system involvement: Secondary | ICD-10-CM

## 2012-03-02 MED ORDER — OXYCODONE-ACETAMINOPHEN 10-650 MG PO TABS: 1.0000 | ORAL_TABLET | Freq: Three times a day (TID) | ORAL | Status: DC

## 2012-03-02 NOTE — Telephone Encounter (Signed)
Pt called req refill of oxyCODONE-acetaminophen (PERCOCET) 10-650 MG per tablet

## 2012-03-02 NOTE — Telephone Encounter (Signed)
ok 

## 2012-03-02 NOTE — Telephone Encounter (Signed)
Rx ready for pick up and patient is aware

## 2012-04-11 ENCOUNTER — Encounter: Payer: Self-pay | Admitting: Internal Medicine

## 2012-04-28 ENCOUNTER — Telehealth: Payer: Self-pay | Admitting: Family Medicine

## 2012-04-28 DIAGNOSIS — G609 Hereditary and idiopathic neuropathy, unspecified: Secondary | ICD-10-CM

## 2012-04-28 DIAGNOSIS — B0229 Other postherpetic nervous system involvement: Secondary | ICD-10-CM

## 2012-04-28 DIAGNOSIS — Z8546 Personal history of malignant neoplasm of prostate: Secondary | ICD-10-CM

## 2012-04-28 DIAGNOSIS — E785 Hyperlipidemia, unspecified: Secondary | ICD-10-CM

## 2012-04-28 DIAGNOSIS — I10 Essential (primary) hypertension: Secondary | ICD-10-CM

## 2012-04-28 DIAGNOSIS — Z23 Encounter for immunization: Secondary | ICD-10-CM

## 2012-04-28 MED ORDER — CARVEDILOL 6.25 MG PO TABS: 6.2500 mg | ORAL_TABLET | Freq: Two times a day (BID) | ORAL | Status: DC

## 2012-04-28 NOTE — Telephone Encounter (Signed)
Pt called req refill of 90 day supply for carvedilol (COREG) 6.25 MG tablet to [facility]

## 2012-05-11 ENCOUNTER — Encounter: Payer: Self-pay | Admitting: Internal Medicine

## 2012-05-11 ENCOUNTER — Ambulatory Visit (INDEPENDENT_AMBULATORY_CARE_PROVIDER_SITE_OTHER): Payer: Federal, State, Local not specified - PPO | Admitting: Internal Medicine

## 2012-05-11 VITALS — BP 110/70 | HR 64 | Ht 75.0 in | Wt 202.8 lb

## 2012-05-11 DIAGNOSIS — R0789 Other chest pain: Secondary | ICD-10-CM

## 2012-05-11 DIAGNOSIS — Z9861 Coronary angioplasty status: Secondary | ICD-10-CM

## 2012-05-11 DIAGNOSIS — K5150 Left sided colitis without complications: Secondary | ICD-10-CM

## 2012-05-11 DIAGNOSIS — I2510 Atherosclerotic heart disease of native coronary artery without angina pectoris: Secondary | ICD-10-CM

## 2012-05-11 DIAGNOSIS — Z86010 Personal history of colonic polyps: Secondary | ICD-10-CM

## 2012-05-11 NOTE — Patient Instructions (Addendum)
Please see Dr. Marca Ancona for the symptoms your experiencing and also to discuss holding your Plavix for a colonoscopy.    Once you have clearance to proceed with the colonoscopy you may call us back and set up a pre-visit and colonoscopy appointment.  The pre-visit is a free visit with the nurse to go over the prep instructions for the colonoscopy.    Thank you for choosing me and [facility].  Iva Boop, M.D., [facility]

## 2012-05-11 NOTE — Progress Notes (Signed)
  Subjective:    Patient ID: Kristopher Wilson, male    DOB: Jan 22, 1936, 76 y.o.   MRN: [id number]  HPI The patient presents for followup of his left-sided ulcerative colitis and a history of colon polyps. He occasionally has bleeding from his hemorrhoids and they swell and protrude, last time one month ago. This is chronic, intermittent and unchanged. He reports no diarrhea or abdominal pain.  He takes Plavix for her coronary artery disease with history of 2 stents. He does have intermittent chest pain, it is usually at rest, he is able to do yardwork without problems. Last 3 or 4 minutes may be a low bit longer and lately sometimes accompanied by headache. It's in the center of his chest without radiation. It does not seem to be accompanied by dyspnea or other symptoms. He reports having had stress test evaluation last year by his cardiologist and reportedly that was okay.  He takes pantoprazole, for GERD and does not have heartburn or dysphagia.  Medications, allergies, past medical history, past surgical history, family history and social history are reviewed and updated in the EMR.  Review of Systems As above    Objective:   Physical Exam Well Developed well-nourished, no acute distress     Assessment & Plan:   1. Left sided ulcerative (chronic) colitis symptoms controlled  2. Personal history of colonic polyps adenomas in 2006 and 2010   3. Atypical chest pain   4. CAD S/P percutaneous coronary intervention/stents on Plavix    1. This time for him to have a surveillance and screening colonoscopy. 2. This would need to be done off Plavix for best results, given the possibility of snare polypectomy. 3. I think his chest pain is atypical and I doubt that we'll be problems holding his Plavix for 5-7 days prior, but he is about due for an annual visit to Dr. Benjamine Mola so I will ask him to do that and we will find out if that is acceptable to hold his Plavix for 5-7 days prior to colonoscopy  but continue his aspirin. 4. Once we know that I will schedule his colonoscopy with a nurse visit to prepare. He understands the plan. 5. We'll place three-month recall in the system to help remember this.   CC: Mariann Laster, MD

## 2012-05-26 ENCOUNTER — Ambulatory Visit (INDEPENDENT_AMBULATORY_CARE_PROVIDER_SITE_OTHER): Payer: Federal, State, Local not specified - PPO | Admitting: Cardiology

## 2012-05-26 ENCOUNTER — Encounter: Payer: Self-pay | Admitting: Cardiology

## 2012-05-26 VITALS — BP 123/78 | HR 90 | Resp 18 | Ht 76.0 in | Wt 199.8 lb

## 2012-05-26 DIAGNOSIS — E785 Hyperlipidemia, unspecified: Secondary | ICD-10-CM

## 2012-05-26 DIAGNOSIS — I2510 Atherosclerotic heart disease of native coronary artery without angina pectoris: Secondary | ICD-10-CM

## 2012-05-26 DIAGNOSIS — I10 Essential (primary) hypertension: Secondary | ICD-10-CM

## 2012-05-26 NOTE — Patient Instructions (Addendum)
Your physician recommends that you return for a FASTING lipid profile /BMET Friday September 20,2013. The lab opens at 8:30am.  Your physician wants you to follow-up in: 1 year with Dr Shirlee Latch. (September 2014). You will receive a reminder letter in the mail two months in advance. If you don't receive a letter, please call our office to schedule the follow-up appointment.

## 2012-05-26 NOTE — Progress Notes (Signed)
Patient ID: Kristopher Wilson, male   DOB: 1936-03-27, 76 y.o.   MRN: [id number] PCP: Dr. Sherren Mocha  76 yo with history of CAD, ulcerative colitis, and shingles with post-herpetic neuralgia presents for cardiology followup.  Patient had PCI in 2000 with BMS to LAD and repeat PCI in 2009 with DES to PLV.  He has occasional episodes of atypical chest pain.  Last stress test was an ETT-myoview in 6/12 showing no evidence for ischemia or infarction.  His activity continues to be limited by a left leg neuropathy that has been thought to be due to post-herpetic neuralgia.  He has been seen by neurology for this. He wears an orthotic on the left leg because of foot drop.  This has been making slow improvement.  [name] has done well recently.  He continues to have occasional episodes of central chest aching that will last for 2-3 minutes at a time and is not related to exertion. This occurs once every few weeks. He is playing some golf and works in his yard.  He does some walking for exercise.  No dyspnea or chest pain with exertion.  He needs a colonoscopy.  Labs (9/11): K 4.4, creatinine 1.4, TSH normal, LDL 33, HDL 41 Labs (12/11): BNP 48 => 190, creatinine 2.4 => 1.3, K 4.1 Labs (8/12): K 4.2, creatinine 1.2, HDL 38.5, LDL 41, TGs 285, TSH normal Labs (10/12): K 4.2, creatinine 1.2, LDL 42, HDL 44, TGs 177  Allergies:  1)  ! Keflex  Past Medical History: 1. CORONARY ARTERY DISEASE: PCI 7/00 with BMS to LAD.  PCI 2009 with Promus DES to PLV.  Lexiscan myoview (12/11): no evidence for ischemia or infarction, EF 67%.   Lexiscan myoview (6/12) with EF 65%, 5' exercise, no evidence for ischemia or infarction.  2. HYPERTENSION  3. VENOUS INSUFFICIENCY 4. POSTHERPETIC NEURALGIA: affecting left lower leg.  5. COLONIC POLYPS, ADENOMATOUS, HX OF 6. ULCERATIVE COLITIS, LEFT SIDED  7. BACK PAIN, LUMBAR  8. URTICARIA, ALLERGIC 9. RESTLESS LEG SYNDROME 10. GERD 11. BENIGN PAROXYSMAL POSITIONAL VERTIGO  12.  HYPERTHYROIDISM 13. PROSTATE CANCER: s/p prostatectomy 14. Hyperlipidemia 15. CKD 16. Diastolic CHF: Echo (85/63) EF 55-60%, mild LAE.  BNP 190 in 12/11.   Family History: Family History High cholesterol Family History Hypertension No FH of Colon Cancer: Father died MI age 58 Throat Cancer-Brother died age 65 MS-brother and son Family History of Heart Disease: Father with MI at 69 Family History of Diabetes: Uncle  Social History: Retired  from [employer].  Now works at [employer] course Married Former Smoker-stopped 35 years ago Alcohol use-yes-2 drinks per day Drug use-no   Current Outpatient Prescriptions  Medication Sig Dispense Refill  . amLODipine (NORVASC) 5 MG tablet Take 1 tablet (5 mg total) by mouth daily.  100 tablet  3  . aspirin 81 MG tablet Take 81 mg by mouth daily.        . carvedilol (COREG) 6.25 MG tablet Take 1 tablet (6.25 mg total) by mouth 2 (two) times daily with a meal.  100 tablet  3  . clopidogrel (PLAVIX) 75 MG tablet Take 1 tablet (75 mg total) by mouth daily.  100 tablet  3  . ezetimibe (ZETIA) 10 MG tablet Take 1 tablet (10 mg total) by mouth daily.  100 tablet  3  . furosemide (LASIX) 20 MG tablet Take 1 tablet (20 mg total) by mouth every other day.  100 tablet  3  . Mesalamine (ASACOL HD)  800 MG TBEC Take 2 tablets (1,600 mg total) by mouth 3 (three) times daily.  540 tablet  3  . nitroGLYCERIN (NITROSTAT) 0.4 MG SL tablet Place 1 tablet (0.4 mg total) under the tongue every 5 (five) minutes as needed.  25 tablet  1  . Omega-3 Fatty Acids (FISH OIL CONCENTRATE) 1000 MG CAPS Take two daily    0  . oxyCODONE-acetaminophen (PERCOCET) 10-650 MG per tablet Take 1 tablet by mouth 3 (three) times daily.  200 tablet  0  . pantoprazole (PROTONIX) 40 MG tablet Take 1 tablet (40 mg total) by mouth daily.  90 tablet  3  . potassium chloride (KLOR-CON M10) 10 MEQ tablet Take 1 tablet (10 mEq total) by mouth every other day.  100 tablet  2  .  rosuvastatin (CRESTOR) 20 MG tablet Take 1 tablet (20 mg total) by mouth daily.  100 tablet  3    BP 123/78  Pulse 90  Resp 18  Ht 6' 4"  (1.93 m)  Wt 199 lb 12.8 oz (90.629 kg)  BMI 24.32 kg/m2  SpO2 93% General:  Well developed, well nourished, in no acute distress. Neck:  Neck supple, no JVD. No masses, thyromegaly or abnormal cervical nodes. Lungs:  Clear bilaterally to auscultation and percussion. Heart:  Non-displaced PMI, chest non-tender; regular rate and rhythm, S1, S2 without murmurs, rubs or gallops. Carotid upstroke normal, no bruit.  Pedals normal pulses. Trace bilateral ankle edema.  Abdomen:  Bowel sounds positive; abdomen soft and non-tender without masses, organomegaly, or hernias noted. No hepatosplenomegaly. Extremities:  No clubbing or cyanosis. Neurologic:  Alert and oriented x 3. Psych:  Normal affect.  Assessment/Plan: 1. CAD: Chronic, relatively rare atypical chest pain.  No chest pain with exertion.  There has been no change to his chronic pattern.  Stress test in 6/12 done for similar symptoms was normal.  He will continue ASA, Plavix (no overt bleeding), statin, Coreg.   2. Hyperlipidemia: check lipids with goal LDL < 70.  3. HTN: BP is under reasonable control.  4. [name] may hold Plavix for colonoscopy and restart afterwards.    [name] 05/26/2012

## 2012-05-27 ENCOUNTER — Other Ambulatory Visit (INDEPENDENT_AMBULATORY_CARE_PROVIDER_SITE_OTHER): Payer: Federal, State, Local not specified - PPO

## 2012-05-27 DIAGNOSIS — I2510 Atherosclerotic heart disease of native coronary artery without angina pectoris: Secondary | ICD-10-CM

## 2012-05-27 LAB — BASIC METABOLIC PANEL
BUN: 20 mg/dL (ref 6–23)
CO2: 26 mEq/L (ref 19–32)
Chloride: 106 mEq/L (ref 96–112)
Creatinine, Ser: 1.2 mg/dL (ref 0.4–1.5)

## 2012-05-27 LAB — LIPID PANEL
Cholesterol: 114 mg/dL (ref 0–200)
HDL: 40.7 mg/dL (ref >39.00)
LDL Cholesterol: 48 mg/dL (ref 0–99)
Triglycerides: 128 mg/dL (ref 0.0–149.0)
VLDL: 25.6 mg/dL (ref 0.0–40.0)

## 2012-05-30 ENCOUNTER — Telehealth: Payer: Self-pay

## 2012-05-30 NOTE — Telephone Encounter (Signed)
Spoke to pts wife who call Agastya and called me back.  We set up pre-visit appt. For July 21, 2012 at 8:00am and November 21,2013 at 8:30 am for the colonoscopy.  We have the ok from Dr. Shirlee Latch for pt to hold his plavix 5 days prior to colonoscopy.  Pt is aware of this.

## 2012-06-03 ENCOUNTER — Other Ambulatory Visit: Payer: Self-pay | Admitting: Family Medicine

## 2012-06-03 DIAGNOSIS — B0229 Other postherpetic nervous system involvement: Secondary | ICD-10-CM

## 2012-06-03 DIAGNOSIS — G609 Hereditary and idiopathic neuropathy, unspecified: Secondary | ICD-10-CM

## 2012-06-03 NOTE — Telephone Encounter (Signed)
Pt needs new rx percocet. Pt has about 2 wk supply left.

## 2012-06-06 MED ORDER — OXYCODONE-ACETAMINOPHEN 10-650 MG PO TABS: 1.0000 | ORAL_TABLET | Freq: Three times a day (TID) | ORAL | Status: DC

## 2012-06-06 NOTE — Telephone Encounter (Signed)
Ok x 3 m0

## 2012-06-07 ENCOUNTER — Telehealth: Payer: Self-pay | Admitting: Family Medicine

## 2012-06-07 DIAGNOSIS — H9190 Unspecified hearing loss, unspecified ear: Secondary | ICD-10-CM

## 2012-06-07 NOTE — Telephone Encounter (Signed)
Referral request sent 

## 2012-06-07 NOTE — Telephone Encounter (Signed)
Pt called called and said that he is schd to get hearing checked on 07/07/12 at [facility] on [address]. Pt is going to need a referral faxed over there, prior to his ov. Pls fax to fax # [phone]                  Phone [phone]

## 2012-06-07 NOTE — Telephone Encounter (Signed)
Left message on machine for patient that Rx ready for pick up. 

## 2012-06-15 ENCOUNTER — Ambulatory Visit (INDEPENDENT_AMBULATORY_CARE_PROVIDER_SITE_OTHER): Payer: Federal, State, Local not specified - PPO | Admitting: Family Medicine

## 2012-06-15 DIAGNOSIS — Z23 Encounter for immunization: Secondary | ICD-10-CM

## 2012-07-21 ENCOUNTER — Ambulatory Visit (AMBULATORY_SURGERY_CENTER): Payer: Federal, State, Local not specified - PPO

## 2012-07-21 VITALS — Ht 76.0 in | Wt 201.7 lb

## 2012-07-21 DIAGNOSIS — Z86010 Personal history of colonic polyps: Secondary | ICD-10-CM

## 2012-07-21 DIAGNOSIS — K5289 Other specified noninfective gastroenteritis and colitis: Secondary | ICD-10-CM

## 2012-07-21 DIAGNOSIS — K529 Noninfective gastroenteritis and colitis, unspecified: Secondary | ICD-10-CM

## 2012-07-21 DIAGNOSIS — Z1211 Encounter for screening for malignant neoplasm of colon: Secondary | ICD-10-CM

## 2012-07-21 MED ORDER — NA SULFATE-K SULFATE-MG SULF 17.5-3.13-1.6 GM/177ML PO SOLN: 1.0000 | Freq: Once | ORAL | Status: DC

## 2012-07-28 ENCOUNTER — Encounter: Payer: Self-pay | Admitting: Internal Medicine

## 2012-07-28 ENCOUNTER — Ambulatory Visit (AMBULATORY_SURGERY_CENTER): Payer: Federal, State, Local not specified - PPO | Admitting: Internal Medicine

## 2012-07-28 VITALS — BP 121/70 | HR 51 | Temp 96.2°F | Resp 14 | Ht 76.0 in | Wt 201.0 lb

## 2012-07-28 DIAGNOSIS — Z8546 Personal history of malignant neoplasm of prostate: Secondary | ICD-10-CM

## 2012-07-28 DIAGNOSIS — K5730 Diverticulosis of large intestine without perforation or abscess without bleeding: Secondary | ICD-10-CM

## 2012-07-28 DIAGNOSIS — Z86010 Personal history of colonic polyps: Secondary | ICD-10-CM

## 2012-07-28 DIAGNOSIS — Z1211 Encounter for screening for malignant neoplasm of colon: Secondary | ICD-10-CM

## 2012-07-28 DIAGNOSIS — K648 Other hemorrhoids: Secondary | ICD-10-CM

## 2012-07-28 DIAGNOSIS — K5150 Left sided colitis without complications: Secondary | ICD-10-CM

## 2012-07-28 DIAGNOSIS — D126 Benign neoplasm of colon, unspecified: Secondary | ICD-10-CM

## 2012-07-28 MED ORDER — SODIUM CHLORIDE 0.9 % IV SOLN: 500.0000 mL | INTRAVENOUS | Status: DC

## 2012-07-28 NOTE — Op Note (Addendum)
[place]  [address] [place], [address]   COLONOSCOPY PROCEDURE REPORT  PATIENT: Kristopher Wilson, Kristopher Wilson  MR#: [id number] BIRTHDATE: 12/30/1935 , 76  yrs. old GENDER: Male ENDOSCOPIST: Gatha Mayer, MD, [facility] PROCEDURE DATE:  07/28/2012 PROCEDURE:   Colonoscopy with biopsy ASA CLASS:   Class III INDICATIONS:Screening and surveillance,personal history of colonic polyps, Patient's personal history of adenomatous colon polyps, and High risk patient with previously diagnosed UC left-sided colitis 15+ years. MEDICATIONS: propofol (Diprivan) 149m IV, MAC sedation, administered by CRNA, and These medications were titrated to patient response per physician's verbal order  DESCRIPTION OF PROCEDURE:   After the risks benefits and alternatives of the procedure were thoroughly explained, informed consent was obtained.  A digital rectal exam revealed no abnormalities of the rectum.   The LB CF-[id number] endoscope was introduced through the anus and advanced to the terminal ileum which was intubated for a short distance. No adverse events experienced.   The quality of the prep was Suprep excellent  The instrument was then slowly withdrawn as the colon was fully examined.      COLON FINDINGS: Moderate diverticulosis was noted in the sigmoid colon.   The colonic mucosa appeared normal throughout the entire examined colon as did the termial ileum.   Small internal hemorrhoids were found.  Retroflexed views revealed internal hemorrhoids. The time to cecum=3 minutes 03 seconds.  Withdrawal time=10 minutes 48 seconds.  The scope was withdrawn and the procedure completed. COMPLICATIONS: There were no complications.  ENDOSCOPIC IMPRESSION: 1.   Moderate diverticulosis was noted in the sigmoid colon 2.   The colonic mucosa appeared normal throughout the entire examined colon - left-sided ulcerative colitis in remission - biopsies taken to screen for dysplasia.  Terminal ileum normal also. 3.   Small internal hemorrhoids 4.  Prior small adenomas 2006 and 2010  RECOMMENDATIONS: 1.  Timing of repeat colonoscopy will be determined by pathology findings. Likely 3 years 2.   Resume Plavix 3.   Office visit (routine) recall 1 year  eSigned:  CGatha Mayer MD, FHarrisburg [facility] Inc11/21/2013 9:05 AMRevised: 07/28/2012 9:05 AM cc: The Patient

## 2012-07-28 NOTE — Progress Notes (Signed)
Propofol given over incremental dosages 

## 2012-07-28 NOTE — Patient Instructions (Addendum)
Things look good - the colitis is in remission.  Routine biopsies to check for pre-cancerous changes in the colon were taken. Diverticulosis and hemorrhoids were also seen - no polyps seen.  Thank you for choosing me and [facility].  Iva Boop, MD, FACG YOU HAD AN ENDOSCOPIC PROCEDURE TODAY AT THE [facility]: Refer to the procedure report that was given to you for any specific questions about what was found during the examination.  If the procedure report does not answer your questions, please call your gastroenterologist to clarify.  If you requested that your care partner not be given the details of your procedure findings, then the procedure report has been included in a sealed envelope for you to review at your convenience later.  YOU SHOULD EXPECT: Some feelings of bloating in the abdomen. Passage of more gas than usual.  Walking can help get rid of the air that was put into your GI tract during the procedure and reduce the bloating. If you had a lower endoscopy (such as a colonoscopy or flexible sigmoidoscopy) you may notice spotting of blood in your stool or on the toilet paper. If you underwent a bowel prep for your procedure, then you may not have a normal bowel movement for a few days.  DIET: Your first meal following the procedure should be a light meal and then it is ok to progress to your normal diet.  A half-sandwich or bowl of soup is an example of a good first meal.  Heavy or fried foods are harder to digest and may make you feel nauseous or bloated.  Likewise meals heavy in dairy and vegetables can cause extra gas to form and this can also increase the bloating.  Drink plenty of fluids but you should avoid alcoholic beverages for 24 hours.  ACTIVITY: Your care partner should take you home directly after the procedure.  You should plan to take it easy, moving slowly for the rest of the day.  You can resume normal activity the day after the procedure  however you should NOT DRIVE or use heavy machinery for 24 hours (because of the sedation medicines used during the test).    SYMPTOMS TO REPORT IMMEDIATELY: A gastroenterologist can be reached at any hour.  During normal business hours, 8:30 AM to 5:00 PM Monday through Friday, call [phone].  After hours and on weekends, please call the GI answering service at [phone] who will take a message and have the physician on call contact you.   Following lower endoscopy (colonoscopy or flexible sigmoidoscopy):  Excessive amounts of blood in the stool  Significant tenderness or worsening of abdominal pains  Swelling of the abdomen that is new, acute  Fever of 100F or higher   FOLLOW UP: If any biopsies were taken you will be contacted by phone or by letter within the next 1-3 weeks.  Call your gastroenterologist if you have not heard about the biopsies in 3 weeks.  Our staff will call the home number listed on your records the next business day following your procedure to check on you and address any questions or concerns that you may have at that time regarding the information given to you following your procedure. This is a courtesy call and so if there is no answer at the home number and we have not heard from you through the emergency physician on call, we will assume that you have returned to your regular daily activities without incident.  SIGNATURES/CONFIDENTIALITY:  You and/or your care partner have signed paperwork which will be entered into your electronic medical record.  These signatures attest to the fact that that the information above on your After Visit Summary has been reviewed and is understood.  Full responsibility of the confidentiality of this discharge information lies with you and/or your care-partner.   Recommendations:  Diverticulosis-handout given  Hemorrhoids- handout given  Waiting results  Office visit 1 year

## 2012-07-28 NOTE — Progress Notes (Signed)
Patient did not experience any of the following events: a burn prior to discharge; a fall within the facility; wrong site/side/patient/procedure/implant event; or a hospital transfer or hospital admission upon discharge from the facility. (G8907) Patient did not have preoperative order for IV antibiotic SSI prophylaxis. (G8918)   Charted by April Mirts RN 

## 2012-07-29 ENCOUNTER — Telehealth: Payer: Self-pay | Admitting: *Deleted

## 2012-07-29 NOTE — Telephone Encounter (Signed)
  Follow up Call-  Call back number 07/28/2012  Post procedure Call Back phone  # [phone]  Permission to leave phone message Yes     Patient questions:  Do you have a fever, pain , or abdominal swelling? no Pain Score  0 *  Have you tolerated food without any problems? yes  Have you been able to return to your normal activities? yes  Do you have any questions about your discharge instructions: Diet   no Medications  no Follow up visit  no  Do you have questions or concerns about your Care? no  Actions: * If pain score is 4 or above: No action needed, pain <4.

## 2012-08-01 ENCOUNTER — Telehealth: Payer: Self-pay | Admitting: Family Medicine

## 2012-08-01 DIAGNOSIS — B0229 Other postherpetic nervous system involvement: Secondary | ICD-10-CM

## 2012-08-01 DIAGNOSIS — G609 Hereditary and idiopathic neuropathy, unspecified: Secondary | ICD-10-CM

## 2012-08-01 NOTE — Telephone Encounter (Signed)
Pt called req to get refill of oxyCODONE-acetaminophen (PERCOCET) 10-650 MG per tablet. Pls call when ready for pick up.

## 2012-08-01 NOTE — Telephone Encounter (Signed)
Okay to refill medication for one month

## 2012-08-03 ENCOUNTER — Other Ambulatory Visit: Payer: Self-pay | Admitting: *Deleted

## 2012-08-03 ENCOUNTER — Encounter: Payer: Self-pay | Admitting: Internal Medicine

## 2012-08-03 DIAGNOSIS — G609 Hereditary and idiopathic neuropathy, unspecified: Secondary | ICD-10-CM

## 2012-08-03 DIAGNOSIS — I10 Essential (primary) hypertension: Secondary | ICD-10-CM

## 2012-08-03 DIAGNOSIS — Z8546 Personal history of malignant neoplasm of prostate: Secondary | ICD-10-CM

## 2012-08-03 DIAGNOSIS — Z23 Encounter for immunization: Secondary | ICD-10-CM

## 2012-08-03 DIAGNOSIS — B0229 Other postherpetic nervous system involvement: Secondary | ICD-10-CM

## 2012-08-03 DIAGNOSIS — E785 Hyperlipidemia, unspecified: Secondary | ICD-10-CM

## 2012-08-03 MED ORDER — ROSUVASTATIN CALCIUM 20 MG PO TABS
20.0000 mg | ORAL_TABLET | Freq: Every day | ORAL | Status: DC
Start: 2012-08-03 — End: 2012-09-08

## 2012-08-03 MED ORDER — OXYCODONE-ACETAMINOPHEN 10-650 MG PO TABS: 1.0000 | ORAL_TABLET | Freq: Three times a day (TID) | ORAL | Status: DC

## 2012-08-03 MED ORDER — EZETIMIBE 10 MG PO TABS: 10.0000 mg | ORAL_TABLET | Freq: Every day | ORAL | Status: DC

## 2012-08-03 MED ORDER — CLOPIDOGREL BISULFATE 75 MG PO TABS: 75.0000 mg | ORAL_TABLET | Freq: Every day | ORAL | Status: DC

## 2012-08-03 MED ORDER — AMLODIPINE BESYLATE 5 MG PO TABS: 5.0000 mg | ORAL_TABLET | Freq: Every day | ORAL | Status: DC

## 2012-08-03 NOTE — Progress Notes (Signed)
Quick Note:  No active colitis or dysplasia Repeat colonoscopy 3 years- 07/2015 ______

## 2012-08-03 NOTE — Telephone Encounter (Signed)
Rx ready for pick up and Left message on machine  

## 2012-08-23 ENCOUNTER — Other Ambulatory Visit: Payer: Federal, State, Local not specified - PPO

## 2012-08-24 ENCOUNTER — Other Ambulatory Visit: Payer: Federal, State, Local not specified - PPO

## 2012-08-25 ENCOUNTER — Other Ambulatory Visit (INDEPENDENT_AMBULATORY_CARE_PROVIDER_SITE_OTHER): Payer: Federal, State, Local not specified - PPO

## 2012-08-25 DIAGNOSIS — Z0000 Encounter for general adult medical examination without abnormal findings: Secondary | ICD-10-CM

## 2012-08-25 LAB — CBC WITH DIFFERENTIAL/PLATELET
Basophils Absolute: 0 10*3/uL (ref 0.0–0.1)
Eosinophils Relative: 4.3 % (ref 0.0–5.0)
HCT: 39.1 % (ref 39.0–52.0)
Lymphocytes Relative: 33.7 % (ref 12.0–46.0)
Lymphs Abs: 1.7 10*3/uL (ref 0.7–4.0)
Monocytes Relative: 8.5 % (ref 3.0–12.0)
Platelets: 204 10*3/uL (ref 150.0–400.0)
RDW: 13.9 % (ref 11.5–14.6)
WBC: 4.9 10*3/uL (ref 4.5–10.5)

## 2012-08-25 LAB — BASIC METABOLIC PANEL
CO2: 27 mEq/L (ref 19–32)
Chloride: 104 mEq/L (ref 96–112)
Glucose, Bld: 105 mg/dL — ABNORMAL HIGH (ref 70–99)
Potassium: 4.3 mEq/L (ref 3.5–5.1)
Sodium: 139 mEq/L (ref 135–145)

## 2012-08-25 LAB — POCT URINALYSIS DIPSTICK
Blood, UA: NEGATIVE
Glucose, UA: NEGATIVE
Nitrite, UA: NEGATIVE
Protein, UA: NEGATIVE
Spec Grav, UA: >=1.03
Urobilinogen, UA: 0.2
pH, UA: 5

## 2012-08-25 LAB — LIPID PANEL
HDL: 39.4 mg/dL (ref >39.00)
LDL Cholesterol: 46 mg/dL (ref 0–99)
Total CHOL/HDL Ratio: 3
Triglycerides: 134 mg/dL (ref 0.0–149.0)
VLDL: 26.8 mg/dL (ref 0.0–40.0)

## 2012-08-25 LAB — HEPATIC FUNCTION PANEL
ALT: 22 U/L (ref 0–53)
AST: 22 U/L (ref 0–37)
Alkaline Phosphatase: 58 U/L (ref 39–117)
Total Bilirubin: 0.9 mg/dL (ref 0.3–1.2)

## 2012-08-25 LAB — PSA: PSA: 0.15 ng/mL (ref 0.10–4.00)

## 2012-08-30 ENCOUNTER — Encounter: Payer: Federal, State, Local not specified - PPO | Admitting: Family Medicine

## 2012-09-08 ENCOUNTER — Encounter: Payer: Self-pay | Admitting: Family Medicine

## 2012-09-08 ENCOUNTER — Ambulatory Visit (INDEPENDENT_AMBULATORY_CARE_PROVIDER_SITE_OTHER): Payer: Federal, State, Local not specified - PPO | Admitting: Family Medicine

## 2012-09-08 VITALS — BP 130/80 | Temp 97.5°F | Ht 74.5 in | Wt 202.0 lb

## 2012-09-08 DIAGNOSIS — I10 Essential (primary) hypertension: Secondary | ICD-10-CM

## 2012-09-08 DIAGNOSIS — K219 Gastro-esophageal reflux disease without esophagitis: Secondary | ICD-10-CM

## 2012-09-08 DIAGNOSIS — K5150 Left sided colitis without complications: Secondary | ICD-10-CM

## 2012-09-08 DIAGNOSIS — E785 Hyperlipidemia, unspecified: Secondary | ICD-10-CM

## 2012-09-08 DIAGNOSIS — G609 Hereditary and idiopathic neuropathy, unspecified: Secondary | ICD-10-CM

## 2012-09-08 DIAGNOSIS — Z0000 Encounter for general adult medical examination without abnormal findings: Secondary | ICD-10-CM

## 2012-09-08 DIAGNOSIS — Z23 Encounter for immunization: Secondary | ICD-10-CM

## 2012-09-08 DIAGNOSIS — Z8546 Personal history of malignant neoplasm of prostate: Secondary | ICD-10-CM

## 2012-09-08 DIAGNOSIS — B0229 Other postherpetic nervous system involvement: Secondary | ICD-10-CM

## 2012-09-08 DIAGNOSIS — I2510 Atherosclerotic heart disease of native coronary artery without angina pectoris: Secondary | ICD-10-CM

## 2012-09-08 DIAGNOSIS — I872 Venous insufficiency (chronic) (peripheral): Secondary | ICD-10-CM

## 2012-09-08 MED ORDER — CLOPIDOGREL BISULFATE 75 MG PO TABS: 75.0000 mg | ORAL_TABLET | Freq: Every day | ORAL | Status: DC

## 2012-09-08 MED ORDER — NITROGLYCERIN 0.4 MG SL SUBL: 0.4000 mg | SUBLINGUAL_TABLET | SUBLINGUAL | Status: DC | PRN

## 2012-09-08 MED ORDER — FUROSEMIDE 20 MG PO TABS: 20.0000 mg | ORAL_TABLET | ORAL | Status: DC

## 2012-09-08 MED ORDER — POTASSIUM CHLORIDE CRYS ER 10 MEQ PO TBCR: 10.0000 meq | EXTENDED_RELEASE_TABLET | ORAL | Status: DC

## 2012-09-08 MED ORDER — AMLODIPINE BESYLATE 5 MG PO TABS: 5.0000 mg | ORAL_TABLET | Freq: Every day | ORAL | Status: DC

## 2012-09-08 MED ORDER — MESALAMINE 800 MG PO TBEC: 2.0000 | DELAYED_RELEASE_TABLET | Freq: Three times a day (TID) | ORAL | Status: DC

## 2012-09-08 MED ORDER — CARVEDILOL 6.25 MG PO TABS: 6.2500 mg | ORAL_TABLET | Freq: Two times a day (BID) | ORAL | Status: DC

## 2012-09-08 MED ORDER — ROSUVASTATIN CALCIUM 20 MG PO TABS: 20.0000 mg | ORAL_TABLET | Freq: Every day | ORAL | Status: DC

## 2012-09-08 MED ORDER — OXYCODONE-ACETAMINOPHEN 10-650 MG PO TABS: 1.0000 | ORAL_TABLET | Freq: Three times a day (TID) | ORAL | Status: DC

## 2012-09-08 MED ORDER — PANTOPRAZOLE SODIUM 40 MG PO TBEC: 40.0000 mg | DELAYED_RELEASE_TABLET | Freq: Every day | ORAL | Status: DC

## 2012-09-08 NOTE — Progress Notes (Signed)
  Subjective:    Patient ID: Kristopher Wilson, male    DOB: 03/18/1936, 77 y.o.   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  Cognitive function normal he walks on a regular basis and plays golf, home health safety reviewed no issues identified, no guns in the house, he does have a health care power of attorney and living well  He continues to take Percocet 3 times daily for the pain clinic consult but he declined   Review of Systems  Constitutional: Negative.   HENT: Negative.   Eyes: Negative.   Respiratory: Negative.   Cardiovascular: Negative.   Gastrointestinal: Negative.   Genitourinary: Negative.   Musculoskeletal: Negative.   Skin: Negative.   Neurological: Negative.   Hematological: Negative.   Psychiatric/Behavioral: Negative.        Objective:   Physical Exam  Constitutional: He is oriented to person, place, and time. He appears well-developed and well-nourished.  HENT:  Head: Normocephalic and atraumatic.  Right Ear: External ear normal.  Left Ear: External ear normal.  Nose: Nose normal.  Mouth/Throat: Oropharynx is clear and moist.  Eyes: Conjunctivae normal and EOM are normal. Pupils are equal, round, and reactive to light.  Neck: Normal range of motion. Neck supple. No JVD present. No tracheal deviation present. No thyromegaly present.  Cardiovascular: Normal rate, regular rhythm, normal heart sounds and  intact distal pulses.  Exam reveals no gallop and no friction rub.   No murmur heard. Pulmonary/Chest: Effort normal and breath sounds normal. No stridor. No respiratory distress. He has no wheezes. He has no rales. He exhibits no tenderness.  Abdominal: Soft. Bowel sounds are normal. He exhibits no distension and no mass. There is no tenderness. There is no rebound and no guarding.  Genitourinary:       Genital urologic exam done by urologic  Musculoskeletal: Normal range of motion. He exhibits no edema and no tenderness.  Lymphadenopathy:    He has no cervical adenopathy.  Neurological: He is alert and oriented to person, place, and time. He has normal reflexes. No cranial nerve deficit. He exhibits normal muscle tone.  Skin: Skin is warm and dry. No rash noted. No erythema. No pallor.  Psychiatric: He has a normal mood and affect. His behavior is normal. Judgment and thought content normal.          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

## 2012-09-08 NOTE — Patient Instructions (Signed)
Continue your current medications  Followup in 1 year sooner if any problems

## 2012-11-24 ENCOUNTER — Telehealth: Payer: Self-pay | Admitting: Family Medicine

## 2012-11-24 NOTE — Telephone Encounter (Signed)
Spoke with patient.  Please schedule an appointment tomorrow at 8:30 for Tdap and Twinrix on my schedule.  thanks

## 2012-11-24 NOTE — Telephone Encounter (Signed)
Pt is sch

## 2012-11-24 NOTE — Telephone Encounter (Signed)
Pt called and said you called to inform him he needed tetanus and Hep injection. (??) Wanted to come in today. Pls advise.

## 2012-11-25 ENCOUNTER — Ambulatory Visit (INDEPENDENT_AMBULATORY_CARE_PROVIDER_SITE_OTHER): Payer: Federal, State, Local not specified - PPO | Admitting: *Deleted

## 2012-11-25 DIAGNOSIS — Z23 Encounter for immunization: Secondary | ICD-10-CM

## 2012-12-27 ENCOUNTER — Ambulatory Visit (INDEPENDENT_AMBULATORY_CARE_PROVIDER_SITE_OTHER): Payer: Federal, State, Local not specified - PPO | Admitting: *Deleted

## 2013-02-03 ENCOUNTER — Telehealth: Payer: Self-pay | Admitting: Family Medicine

## 2013-02-03 NOTE — Telephone Encounter (Signed)
Pt needs refill of: oxyCODONE-acetaminophen (PERCOCET) 10-650 MG per tablet

## 2013-02-06 MED ORDER — OXYCODONE-ACETAMINOPHEN 10-325 MG PO TABS: 1.0000 | ORAL_TABLET | Freq: Three times a day (TID) | ORAL | Status: DC

## 2013-02-06 NOTE — Telephone Encounter (Signed)
Rx ready for pick up and patient is aware

## 2013-02-06 NOTE — Telephone Encounter (Signed)
okay

## 2013-05-11 ENCOUNTER — Telehealth: Payer: Self-pay | Admitting: Family Medicine

## 2013-05-11 NOTE — Telephone Encounter (Signed)
Pt needs new rx oxycodone

## 2013-05-12 ENCOUNTER — Other Ambulatory Visit: Payer: Self-pay

## 2013-05-12 MED ORDER — OXYCODONE-ACETAMINOPHEN 10-325 MG PO TABS: 1.0000 | ORAL_TABLET | Freq: Three times a day (TID) | ORAL | Status: DC

## 2013-05-30 ENCOUNTER — Ambulatory Visit (INDEPENDENT_AMBULATORY_CARE_PROVIDER_SITE_OTHER): Payer: Federal, State, Local not specified - PPO | Admitting: *Deleted

## 2013-05-30 DIAGNOSIS — Z23 Encounter for immunization: Secondary | ICD-10-CM

## 2013-06-21 ENCOUNTER — Other Ambulatory Visit: Payer: Self-pay | Admitting: Family Medicine

## 2013-08-19 ENCOUNTER — Other Ambulatory Visit: Payer: Self-pay | Admitting: Family Medicine

## 2013-08-23 ENCOUNTER — Telehealth: Payer: Self-pay | Admitting: Family Medicine

## 2013-08-23 MED ORDER — OXYCODONE-ACETAMINOPHEN 10-325 MG PO TABS: 1.0000 | ORAL_TABLET | Freq: Three times a day (TID) | ORAL | Status: DC

## 2013-08-23 NOTE — Telephone Encounter (Signed)
Pt needs new rx oxycodone. Pt has about 1 wk left of med

## 2013-08-23 NOTE — Telephone Encounter (Signed)
Rx ready for pick up. 

## 2013-09-05 ENCOUNTER — Other Ambulatory Visit (INDEPENDENT_AMBULATORY_CARE_PROVIDER_SITE_OTHER): Payer: Federal, State, Local not specified - PPO

## 2013-09-05 DIAGNOSIS — Z0000 Encounter for general adult medical examination without abnormal findings: Secondary | ICD-10-CM

## 2013-09-05 LAB — CBC WITH DIFFERENTIAL/PLATELET
Eosinophils Absolute: 0.2 10*3/uL (ref 0.0–0.7)
Eosinophils Relative: 3.1 % (ref 0.0–5.0)
Lymphocytes Relative: 36.1 % (ref 12.0–46.0)
MCV: 85.8 fl (ref 78.0–100.0)
Monocytes Absolute: 0.4 10*3/uL (ref 0.1–1.0)
Neutrophils Relative %: 53.8 % (ref 43.0–77.0)
Platelets: 230 10*3/uL (ref 150.0–400.0)
WBC: 6.5 10*3/uL (ref 4.5–10.5)

## 2013-09-05 LAB — LIPID PANEL
Cholesterol: 193 mg/dL (ref 0–200)
Total CHOL/HDL Ratio: 4
Triglycerides: 412 mg/dL — ABNORMAL HIGH (ref 0.0–149.0)
VLDL: 82.4 mg/dL — ABNORMAL HIGH (ref 0.0–40.0)

## 2013-09-05 LAB — POCT URINALYSIS DIPSTICK
Blood, UA: NEGATIVE
Glucose, UA: NEGATIVE
Protein, UA: NEGATIVE
Spec Grav, UA: 1.025
Urobilinogen, UA: 0.2
pH, UA: 5.5

## 2013-09-05 LAB — HEPATIC FUNCTION PANEL
Bilirubin, Direct: 0.1 mg/dL (ref 0.0–0.3)
Total Bilirubin: 0.8 mg/dL (ref 0.3–1.2)

## 2013-09-05 LAB — BASIC METABOLIC PANEL
BUN: 15 mg/dL (ref 6–23)
Calcium: 9.4 mg/dL (ref 8.4–10.5)
Chloride: 103 mEq/L (ref 96–112)
Creatinine, Ser: 1.2 mg/dL (ref 0.4–1.5)

## 2013-09-05 LAB — TSH: TSH: 1.51 u[IU]/mL (ref 0.35–5.50)

## 2013-09-06 ENCOUNTER — Ambulatory Visit: Payer: Federal, State, Local not specified - PPO | Admitting: Family Medicine

## 2013-09-12 ENCOUNTER — Encounter: Payer: Self-pay | Admitting: Family Medicine

## 2013-09-12 ENCOUNTER — Ambulatory Visit (INDEPENDENT_AMBULATORY_CARE_PROVIDER_SITE_OTHER): Payer: Federal, State, Local not specified - PPO | Admitting: Family Medicine

## 2013-09-12 VITALS — BP 130/70 | Temp 97.8°F | Ht 75.0 in | Wt 201.0 lb

## 2013-09-12 DIAGNOSIS — I872 Venous insufficiency (chronic) (peripheral): Secondary | ICD-10-CM

## 2013-09-12 DIAGNOSIS — B0229 Other postherpetic nervous system involvement: Secondary | ICD-10-CM

## 2013-09-12 DIAGNOSIS — H8110 Benign paroxysmal vertigo, unspecified ear: Secondary | ICD-10-CM

## 2013-09-12 DIAGNOSIS — R61 Generalized hyperhidrosis: Secondary | ICD-10-CM

## 2013-09-12 DIAGNOSIS — E785 Hyperlipidemia, unspecified: Secondary | ICD-10-CM

## 2013-09-12 DIAGNOSIS — Z8546 Personal history of malignant neoplasm of prostate: Secondary | ICD-10-CM

## 2013-09-12 DIAGNOSIS — Z23 Encounter for immunization: Secondary | ICD-10-CM

## 2013-09-12 DIAGNOSIS — G2581 Restless legs syndrome: Secondary | ICD-10-CM

## 2013-09-12 DIAGNOSIS — I2510 Atherosclerotic heart disease of native coronary artery without angina pectoris: Secondary | ICD-10-CM

## 2013-09-12 DIAGNOSIS — I10 Essential (primary) hypertension: Secondary | ICD-10-CM

## 2013-09-12 DIAGNOSIS — K5150 Left sided colitis without complications: Secondary | ICD-10-CM

## 2013-09-12 DIAGNOSIS — K219 Gastro-esophageal reflux disease without esophagitis: Secondary | ICD-10-CM

## 2013-09-12 DIAGNOSIS — G609 Hereditary and idiopathic neuropathy, unspecified: Secondary | ICD-10-CM

## 2013-09-12 MED ORDER — NITROGLYCERIN 0.4 MG SL SUBL: SUBLINGUAL_TABLET | SUBLINGUAL | Status: DC

## 2013-09-12 MED ORDER — MESALAMINE 800 MG PO TBEC: 2.0000 | DELAYED_RELEASE_TABLET | Freq: Three times a day (TID) | ORAL | Status: DC

## 2013-09-12 MED ORDER — CLOPIDOGREL BISULFATE 75 MG PO TABS: 75.0000 mg | ORAL_TABLET | Freq: Every day | ORAL | Status: DC

## 2013-09-12 MED ORDER — ROSUVASTATIN CALCIUM 20 MG PO TABS: 20.0000 mg | ORAL_TABLET | Freq: Every day | ORAL | Status: DC

## 2013-09-12 MED ORDER — CARVEDILOL 6.25 MG PO TABS: ORAL_TABLET | ORAL | Status: DC

## 2013-09-12 MED ORDER — PANTOPRAZOLE SODIUM 40 MG PO TBEC: 40.0000 mg | DELAYED_RELEASE_TABLET | Freq: Every day | ORAL | Status: DC

## 2013-09-12 MED ORDER — AMLODIPINE BESYLATE 5 MG PO TABS: 5.0000 mg | ORAL_TABLET | Freq: Every day | ORAL | Status: DC

## 2013-09-12 NOTE — Patient Instructions (Signed)
Continue your current medications  Return in one year sooner if any problems  Remember to get your eye exam this winter  Soak and file your toenails weekly

## 2013-09-12 NOTE — Progress Notes (Signed)
   Subjective:    Patient ID: Kristopher Wilson, male    DOB: Jun 28, 1936, 78 y.o.   MRN: [id number]  HPI Kristopher Wilson is a delightful 78 year old married male nonsmoker who comes in today for a Medicare wellness examination because of a history of hypertension, coronary disease, venous insufficiency, neuropathy from shingles on chronic pain medication, reflux esophagitis, hyperlipidemia  His medication list reviewed there've been no changes. He sees the urologist twice yearly because of a history of prostate cancer. He saw cardiology in the fall and his exam was normal no change in medication. He feels well no chest pain shortness of breath etc.  He continues to require Percocet 10 mg 3 times daily because of the neuropathy secondary to shingles  Cognitive function normal he walks on a regular basis home health safety reviewed no issues identified, no guns in the house, he does have a health care power of attorney and living well  Vaccinations up-to-date he was given the pneumococcal vaccine today     Review of Systems  Constitutional: Negative.   HENT: Negative.   Eyes: Negative.   Respiratory: Negative.   Cardiovascular: Negative.   Gastrointestinal: Negative.   Genitourinary: Negative.   Musculoskeletal: Negative.   Skin: Negative.   Neurological: Negative.   Psychiatric/Behavioral: Negative.        Objective:   Physical Exam  Nursing note and vitals reviewed. Constitutional: He is oriented to person, place, and time. He appears well-developed and well-nourished.  HENT:  Head: Normocephalic and atraumatic.  Right Ear: External ear normal.  Left Ear: External ear normal.  Nose: Nose normal.  Mouth/Throat: Oropharynx is clear and moist.  Eyes: Conjunctivae and EOM are normal. Pupils are equal, round, and reactive to light.  Neck: Normal range of motion. Neck supple. No JVD present. No tracheal deviation present. No thyromegaly present.  Cardiovascular: Normal rate, regular rhythm,  normal heart sounds and intact distal pulses.  Exam reveals no gallop and no friction rub.   No murmur heard. No carotid or aortic bruits peripheral pulses 1+ and symmetrical  Pulmonary/Chest: Effort normal and breath sounds normal. No stridor. No respiratory distress. He has no wheezes. He has no rales. He exhibits no tenderness.  Abdominal: Soft. Bowel sounds are normal. He exhibits no distension and no mass. There is no tenderness. There is no rebound and no guarding.  Genitourinary:  Genital rectal exam done by urologist twice yearly therefore not repeated  Musculoskeletal: Normal range of motion. He exhibits no edema and no tenderness.  Lymphadenopathy:    He has no cervical adenopathy.  Neurological: He is alert and oriented to person, place, and time. He has normal reflexes. No cranial nerve deficit. He exhibits normal muscle tone.  Skin: Skin is warm and dry. No rash noted. No erythema. No pallor.  Total body skin exam normal  He has a fungal infection involving all 10 toenails are trimmed and foreign  Psychiatric: He has a normal mood and affect. His behavior is normal. Judgment and thought content normal.          Assessment & Plan:  Hypertension at goal continue current therapy  Coronary disease asymptomatic  Neuropathy secondary to shingles continue Percocet 10 mg 3 times daily  Reflux esophagitis continue Protonix  Hyperlipidemia continue Crestor  History of ulcerative colitis continue Asacol 2 tabs 3 times daily,,,,,,,, yearly followup by GI

## 2013-09-12 NOTE — Progress Notes (Signed)
Pre visit review using our clinic review tool, if applicable. No additional management support is needed unless otherwise documented below in the visit note. 

## 2013-09-13 ENCOUNTER — Ambulatory Visit (INDEPENDENT_AMBULATORY_CARE_PROVIDER_SITE_OTHER)
Admission: RE | Admit: 2013-09-13 | Discharge: 2013-09-13 | Disposition: A | Discharge status: Home / Self Care | Payer: Federal, State, Local not specified - PPO | Source: Ambulatory Visit | Attending: Family Medicine | Admitting: Family Medicine

## 2013-09-13 DIAGNOSIS — R61 Generalized hyperhidrosis: Secondary | ICD-10-CM

## 2013-10-04 ENCOUNTER — Encounter: Payer: Self-pay | Admitting: Family Medicine

## 2013-11-14 ENCOUNTER — Encounter: Payer: Self-pay | Admitting: Family Medicine

## 2013-11-14 ENCOUNTER — Ambulatory Visit (INDEPENDENT_AMBULATORY_CARE_PROVIDER_SITE_OTHER): Payer: Federal, State, Local not specified - PPO | Admitting: Family Medicine

## 2013-11-14 VITALS — BP 130/80 | Temp 97.7°F | Wt 200.0 lb

## 2013-11-14 DIAGNOSIS — G2581 Restless legs syndrome: Secondary | ICD-10-CM

## 2013-11-14 DIAGNOSIS — Z8546 Personal history of malignant neoplasm of prostate: Secondary | ICD-10-CM

## 2013-11-14 DIAGNOSIS — E785 Hyperlipidemia, unspecified: Secondary | ICD-10-CM

## 2013-11-14 MED ORDER — ATORVASTATIN CALCIUM 40 MG PO TABS: 40.0000 mg | ORAL_TABLET | Freq: Every day | ORAL | Status: DC

## 2013-11-14 MED ORDER — PRAMIPEXOLE DIHYDROCHLORIDE 1 MG PO TABS: ORAL_TABLET | ORAL | Status: DC

## 2013-11-14 MED ORDER — OXYCODONE-ACETAMINOPHEN 10-325 MG PO TABS: 1.0000 | ORAL_TABLET | Freq: Three times a day (TID) | ORAL | Status: DC

## 2013-11-14 NOTE — Progress Notes (Signed)
   Subjective:    Patient ID: Kristopher Wilson, male    DOB: [birth year], 78 y.o.   MRN: [id number]  HPI Kristopher Wilson is a 78 year old male married nonsmoker who comes in today for evaluation of 3 issues  He needs his Percocet which he takes 3 times daily for post herpetic neuralgia refilled  He takes Crestor 20 mg daily however his insurance is forcing him to switch to Lipitor  He has cramps in his legs for about a year wakes him up tonight has to walk around. It doesn't bother him during the day and is not associated with exercise   Review of Systems Review of systems otherwise negative    Objective:   Physical Exam Well-developed well-nourished in no acute distress vital signs stable he is afebrile examination lower extremities normal skin normal pulses       Assessment & Plan:  Chronic pain secondary to shingles neuropathy refill Percocet  Restless leg syndrome,,,,,,,,,, trial of Mirapex  Hyperlipidemia........ switch to Lipitor 40

## 2013-11-14 NOTE — Patient Instructions (Signed)
Mirapex 1 mg one half tab each bedtime for one week then increase to a full tablet nightly at bedtime  Stop the Crestor  Begin Lipitor 40 mg 1 daily  Return when necessary.........Marland Kitchen

## 2013-11-14 NOTE — Progress Notes (Signed)
Pre visit review using our clinic review tool, if applicable. No additional management support is needed unless otherwise documented below in the visit note. 

## 2014-02-28 ENCOUNTER — Telehealth: Payer: Self-pay | Admitting: Family Medicine

## 2014-02-28 DIAGNOSIS — G2581 Restless legs syndrome: Secondary | ICD-10-CM

## 2014-02-28 MED ORDER — OXYCODONE-ACETAMINOPHEN 10-325 MG PO TABS: 1.0000 | ORAL_TABLET | Freq: Three times a day (TID) | ORAL | Status: DC

## 2014-02-28 NOTE — Telephone Encounter (Signed)
Pt req rx on oxyCODONE-acetaminophen (PERCOCET) 10-325 MG per tablet

## 2014-03-01 NOTE — Telephone Encounter (Signed)
Rx ready for pick up and patient is aware.

## 2014-05-23 ENCOUNTER — Telehealth: Payer: Self-pay | Admitting: Family Medicine

## 2014-05-23 DIAGNOSIS — G2581 Restless legs syndrome: Secondary | ICD-10-CM

## 2014-05-23 MED ORDER — OXYCODONE-ACETAMINOPHEN 10-325 MG PO TABS: 1.0000 | ORAL_TABLET | Freq: Three times a day (TID) | ORAL | Status: DC

## 2014-05-23 NOTE — Telephone Encounter (Signed)
Pt request refill of the following: oxyCODONE-acetaminophen (PERCOCET) 10-325 MG per tablet    Phamacy: pick up

## 2014-05-23 NOTE — Telephone Encounter (Signed)
Rx ready for pick up and wife is aware.

## 2014-05-31 ENCOUNTER — Encounter: Payer: Self-pay | Admitting: Family Medicine

## 2014-05-31 ENCOUNTER — Ambulatory Visit (INDEPENDENT_AMBULATORY_CARE_PROVIDER_SITE_OTHER): Payer: Federal, State, Local not specified - PPO | Admitting: Family Medicine

## 2014-05-31 VITALS — BP 140/90 | Temp 97.9°F | Wt 200.0 lb

## 2014-05-31 DIAGNOSIS — M25511 Pain in right shoulder: Secondary | ICD-10-CM | POA: Insufficient documentation

## 2014-05-31 DIAGNOSIS — M25512 Pain in left shoulder: Secondary | ICD-10-CM | POA: Insufficient documentation

## 2014-05-31 DIAGNOSIS — Z23 Encounter for immunization: Secondary | ICD-10-CM

## 2014-05-31 DIAGNOSIS — M25519 Pain in unspecified shoulder: Secondary | ICD-10-CM

## 2014-05-31 NOTE — Progress Notes (Signed)
   Subjective:    Patient ID: Kristopher Wilson, male    DOB: [birth year], 78 y.o.   MRN: [id number]  HPI Kristopher Wilson is a 31 -year-old male who comes in today for evaluation of pain in his left shoulder for 1 month  He has pain in his left shoulder about a month ago no history of trauma. He is limited by what he can take his is on Plavix because he's had 2 stents.   Review of Systems    review of systems otherwise negative Objective:   Physical Exam   well-developed well-nourished male in no acute distress vital signs stable is afebrile in the sitting position both shoulders appear normal. No erythema or swelling. He has fairly normal range of motion of that left shoulder. There is some grinding with movement.       Assessme pain left shoulder probably some arthritis gout rotator cuff injury.......Marland Kitchen or so consult nt & Plan:  Pain left shoulder......... doubt rotator cuff injury....... Tylenol 2 tabs 3 times daily,,,,,,,,, or so consult with Dr. Durward Fortes.

## 2014-05-31 NOTE — Patient Instructions (Signed)
Tylenol 2 tabs 3 times daily  Ice packs 30 minutes 4 times daily  Call and make an appointment to see Dr. Joni Fears orthopedist

## 2014-05-31 NOTE — Progress Notes (Signed)
Pre visit review using our clinic review tool, if applicable. No additional management support is needed unless otherwise documented below in the visit note. 

## 2014-06-01 ENCOUNTER — Telehealth: Payer: Self-pay | Admitting: *Deleted

## 2014-06-01 DIAGNOSIS — M25512 Pain in left shoulder: Secondary | ICD-10-CM

## 2014-06-01 NOTE — Telephone Encounter (Signed)
referral placed

## 2014-07-11 ENCOUNTER — Other Ambulatory Visit: Payer: Self-pay | Admitting: Family Medicine

## 2014-08-20 ENCOUNTER — Telehealth: Payer: Self-pay | Admitting: Family Medicine

## 2014-08-20 DIAGNOSIS — G2581 Restless legs syndrome: Secondary | ICD-10-CM

## 2014-08-20 MED ORDER — OXYCODONE-ACETAMINOPHEN 10-325 MG PO TABS: 1.0000 | ORAL_TABLET | Freq: Three times a day (TID) | ORAL | Status: DC

## 2014-08-20 NOTE — Telephone Encounter (Signed)
Rx ready for pick up and patient is aware 

## 2014-08-20 NOTE — Telephone Encounter (Signed)
Pt request refill of the following: oxyCODONE-acetaminophen (PERCOCET) 10-325 MG per tablet   Phamacy: pick up

## 2014-08-22 ENCOUNTER — Telehealth: Payer: Self-pay | Admitting: Cardiology

## 2014-08-22 NOTE — Progress Notes (Signed)
Cardiology Office Note   Date:  08/23/2014   ID:  Kristopher Wilson, DOB 1936/05/20, MRN [id number]  PCP:  Joycelyn Man, MD  Cardiologist:  Dr. [name]     History of Present Illness: Kristopher Wilson is a 78 y.o. male with a history of CAD, ulcerative colitis, and shingles with post-herpetic neuralgia. Patient had PCI in 2000 with BMS to LAD and repeat PCI in 2009 with DES to PLV. ETT-myoview in 6/12 showed no evidence for ischemia or infarction. His activity continues to be limited by a left leg neuropathy that has been thought to be due to post-herpetic neuralgia. He has been seen by neurology for this. He wears an orthotic on the left leg because of foot drop. This has been making slow improvement.  Last seen by Dr. [name] 05/2012.  He returns for surgical clearance.  He needs L shoulder rotator cuff surgery with Dr. Durward Fortes 09/13/14.  He is doing well.  He remains active.  He instructs at the golf range.  He also does a lot of yard work. He can lift 40 lbs and carry it without problems.  He can definitely achieve > 4 METs without angina.  He denies chest pain, dyspnea, orthopnea, PND, edema, syncope.     Studies:   - Echo (12/11):  EF 55-65%, mild LAE  - Nuclear (10/12):  No ischemia, EF 65%   Recent Labs: 09/05/2013: ALT 25; BUN 15; Creatinine 1.2; Direct LDL 87.6; Hemoglobin 14.1; Potassium 3.8; Sodium 138; TSH 1.51   Estimated Creatinine Clearance: 62.6 mL/min (by C-G formula based on Cr of 1.2).    Recent Radiology: No results found.    Wt Readings from Last 3 Encounters:  08/23/14 206 lb (93.441 kg)  05/31/14 200 lb (90.719 kg)  11/14/13 200 lb (90.719 kg)    Past Medical History: 1. CORONARY ARTERY DISEASE: PCI 7/00 with BMS to LAD. PCI 2009 with Promus DES to PLV. Lexiscan myoview (12/11): no evidence for ischemia or infarction, EF 67%. Lexiscan myoview (6/12) with EF 65%, 5' exercise, no evidence for ischemia or infarction.  2.  HYPERTENSION  3. VENOUS INSUFFICIENCY 4. POSTHERPETIC NEURALGIA: affecting left lower leg.  5. COLONIC POLYPS, ADENOMATOUS, HX OF 6. ULCERATIVE COLITIS, LEFT SIDED  7. BACK PAIN, LUMBAR  8. URTICARIA, ALLERGIC 9. RESTLESS LEG SYNDROME 10. GERD 11. BENIGN PAROXYSMAL POSITIONAL VERTIGO  12. HYPERTHYROIDISM 13. PROSTATE CANCER: s/p prostatectomy 14. Hyperlipidemia 15. CKD 16. Diastolic CHF: Echo (04/88) EF 55-60%, mild LAE. BNP 190 in 12/11.  Past Medical History  Diagnosis Date  . CAD (coronary artery disease)   . Hypertension   . Ulcer   . GERD (gastroesophageal reflux disease)   . Hyperthyroidism   . Hyperlipidemia   . RLS (restless legs syndrome)   . CKD (chronic kidney disease)   . CHF (congestive heart failure)   . VENOUS INSUFFICIENCY 07/28/2010  . PERIPHERAL NEUROPATHY 10/11/2007  . COLONIC POLYPS, ADENOMATOUS, HX OF   . ULCERATIVE COLITIS, LEFT SIDED 2000  . BACK PAIN, LUMBAR 10/18/2009  . URTICARIA, ALLERGIC 08/05/2009  . Benign paroxysmal positional vertigo 09/30/2007  . 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 PROSTATE CANCER, HX OF 07/07/2007    s/p prostatectomy  . Herpes zoster with other nervous system complications(053.19)   . Loss of hearing     Bil/has hearing aids in both ears    Current Outpatient Prescriptions  Medication Sig Dispense Refill  . amLODipine (NORVASC) 5 MG tablet Take 1 tablet (5 mg total) by  mouth daily. 100 tablet 3  . aspirin 81 MG tablet Take 81 mg by mouth daily.      Marland Kitchen atorvastatin (LIPITOR) 40 MG tablet Take 1 tablet (40 mg total) by mouth daily. 90 tablet 3  . carvedilol (COREG) 6.25 MG tablet TAKE 1 TABLET TWICE A DAY  WITH MEALS 100 tablet 2  . clopidogrel (PLAVIX) 75 MG tablet Take 1 tablet (75 mg total) by mouth daily. 100 tablet 3  . Mesalamine (ASACOL HD) 800 MG TBEC Take 2 tablets (1,600 mg total) by mouth 3 (three) times daily. 540 tablet 3  . nitroGLYCERIN (NITROSTAT) 0.4 MG SL tablet DISSOLVE 1 TABLET UNDER THETONGUE EVERY 5  MINUTES AS  NEEDED 25 tablet 1  . oxyCODONE-acetaminophen (PERCOCET) 10-325 MG per tablet Take 1 tablet by mouth 3 (three) times daily. 200 tablet 0  . pantoprazole (PROTONIX) 40 MG tablet Take 1 tablet (40 mg total) by mouth daily. 90 tablet 3  . pramipexole (MIRAPEX) 1 MG tablet One by mouth each bedtime for RLS 100 tablet 3   No current facility-administered medications for this visit.     Allergies:   Cephalexin   Social History:  The patient  reports that he quit smoking about 38 years ago. His smoking use included Cigarettes. He smoked 0.00 packs per day. He has never used smokeless tobacco. He reports that he drinks about 3.5 oz of alcohol per week. He reports that he does not use illicit drugs.   Family History:  The patient's family history includes Cancer in his brother; Diabetes in his maternal uncle and maternal uncle; Heart attack in his father; Multiple sclerosis in his brother and son; Throat cancer in his brother. There is no history of Colon cancer or Stroke.    ROS:  Please see the history of present illness.   He has occasional diarrhea from the UC.   All other systems reviewed and negative.    PHYSICAL EXAM: VS:  BP 122/60 mmHg  Pulse 62  Ht 6\' 3"  (1.905 m)  Wt 206 lb (93.441 kg)  BMI 25.75 kg/m2 Well nourished, well developed, in no acute distress HEENT: normal Neck: no JVD Carotids:  No bruits bilaterally.  Cardiac:  normal S1, S2;  RRR; no murmur   Lungs:   clear to auscultation bilaterally, no wheezing, rhonchi or rales Abd: soft, nontender, no hepatomegaly Ext:  no edema Skin: warm and dry Neuro:  CNs 2-12 intact, no focal abnormalities noted  EKG:  NSR, HR 62, normal axis, no change since prior tracing.       ASSESSMENT AND PLAN:  1.  Surgical Clearance:  The patient does not have any unstable cardiac conditions.  He can achieve 4 METs or greater without anginal symptoms.  According to [facility] and AHA guidelines, he requires no further cardiac workup  prior his noncardiac surgery.  He should be at acceptable risk.  Our service is available as necessary in the perioperative period. He can hold his Plavix and resume post op when felt to be safe.  He should remain on ASA without interruption unless the bleeding risk is too great.  He should remain on beta blocker.   2.  Coronary Artery Disease:  No angina.  Continue ASA, Plavix, beta blocker, statin.   3.  Hypertension:  Controlled.  4.  Hyperlipidemia:  Continue statin.  This is managed by PCP.  09/05/2013: ALT 25; Direct LDL 87.6; HDL Cholesterol by NMR 45.90    Disposition:   FU with Dr.  [name] 1 year.    Signed, Kristopher Wilson, MHS 08/23/2014 9:11 AM    [facility] [address], [place], [place]  [address] Phone: [phone]; Fax: [phone]

## 2014-08-22 NOTE — Telephone Encounter (Signed)
LMTCB

## 2014-08-22 NOTE — Telephone Encounter (Signed)
Last office visit with Dr Aundra Dubin 05/2012, appt scheduled with Margaret Pyle for surgical clearance 08/23/14.  Pt advised and agreed.

## 2014-08-22 NOTE — Telephone Encounter (Signed)
New Message  Pt is having Left Should Rotator cuff with Dr. Durward Fortes on Jan 7. Pt requests to know how many days before surgery he should stop Plavix (blood thiner) before Jan 7. Please call and discuss.

## 2014-08-23 ENCOUNTER — Ambulatory Visit (INDEPENDENT_AMBULATORY_CARE_PROVIDER_SITE_OTHER): Payer: Federal, State, Local not specified - PPO | Admitting: Physician Assistant

## 2014-08-23 ENCOUNTER — Encounter: Payer: Self-pay | Admitting: Physician Assistant

## 2014-08-23 VITALS — BP 122/60 | HR 62 | Ht 75.0 in | Wt 206.0 lb

## 2014-08-23 DIAGNOSIS — Z01810 Encounter for preprocedural cardiovascular examination: Secondary | ICD-10-CM

## 2014-08-23 DIAGNOSIS — I2510 Atherosclerotic heart disease of native coronary artery without angina pectoris: Secondary | ICD-10-CM

## 2014-08-23 DIAGNOSIS — I10 Essential (primary) hypertension: Secondary | ICD-10-CM

## 2014-08-23 DIAGNOSIS — E785 Hyperlipidemia, unspecified: Secondary | ICD-10-CM

## 2014-08-23 NOTE — Patient Instructions (Signed)
Your physician recommends that you continue on your current medications as directed. Please refer to the Current Medication list given to you today.  Your physician wants you to follow-up in: Kristopher Wilson. You will receive a reminder letter in the mail two months in advance. If you don't receive a letter, please call our office to schedule the follow-up appointment.   OK TO HOLD PLAVIX 5-7 DAYS BEFORE SURGERY; THEN RESUME AFTER SURGERY ONCE THE SURGEON SAYS IT IS SAFE TO RESUME  MAKE SURE TO STAY ON THE ASPIRIN Kristopher Wilson

## 2014-09-06 ENCOUNTER — Other Ambulatory Visit: Payer: Federal, State, Local not specified - PPO

## 2014-09-06 ENCOUNTER — Other Ambulatory Visit (INDEPENDENT_AMBULATORY_CARE_PROVIDER_SITE_OTHER): Payer: Federal, State, Local not specified - PPO

## 2014-09-06 DIAGNOSIS — Z0000 Encounter for general adult medical examination without abnormal findings: Secondary | ICD-10-CM

## 2014-09-06 DIAGNOSIS — E785 Hyperlipidemia, unspecified: Secondary | ICD-10-CM

## 2014-09-06 LAB — PSA: PSA: 0.22 ng/mL (ref 0.10–4.00)

## 2014-09-06 LAB — POCT URINALYSIS DIPSTICK
Bilirubin, UA: NEGATIVE
Blood, UA: NEGATIVE
Glucose, UA: NEGATIVE
KETONES UA: NEGATIVE
LEUKOCYTES UA: NEGATIVE
NITRITE UA: NEGATIVE
PROTEIN UA: NEGATIVE
Spec Grav, UA: 1.025
Urobilinogen, UA: 0.2
pH, UA: 5.5

## 2014-09-06 LAB — CBC WITH DIFFERENTIAL/PLATELET
BASOS PCT: 0.4 % (ref 0.0–3.0)
Basophils Absolute: 0 10*3/uL (ref 0.0–0.1)
EOS ABS: 0.2 10*3/uL (ref 0.0–0.7)
Eosinophils Relative: 4.1 % (ref 0.0–5.0)
HCT: 39.4 % (ref 39.0–52.0)
HEMOGLOBIN: 12.9 g/dL — AB (ref 13.0–17.0)
Lymphocytes Relative: 33.7 % (ref 12.0–46.0)
Lymphs Abs: 1.8 10*3/uL (ref 0.7–4.0)
MCHC: 32.9 g/dL (ref 30.0–36.0)
MCV: 88.4 fl (ref 78.0–100.0)
MONO ABS: 0.4 10*3/uL (ref 0.1–1.0)
Monocytes Relative: 6.7 % (ref 3.0–12.0)
NEUTROS ABS: 2.9 10*3/uL (ref 1.4–7.7)
Neutrophils Relative %: 55.1 % (ref 43.0–77.0)
Platelets: 202 10*3/uL (ref 150.0–400.0)
RBC: 4.46 Mil/uL (ref 4.22–5.81)
RDW: 14.4 % (ref 11.5–15.5)
WBC: 5.3 10*3/uL (ref 4.0–10.5)

## 2014-09-06 LAB — HEPATIC FUNCTION PANEL
ALK PHOS: 81 U/L (ref 39–117)
ALT: 24 U/L (ref 0–53)
AST: 24 U/L (ref 0–37)
Albumin: 4.2 g/dL (ref 3.5–5.2)
BILIRUBIN DIRECT: 0.1 mg/dL (ref 0.0–0.3)
TOTAL PROTEIN: 6.3 g/dL (ref 6.0–8.3)
Total Bilirubin: 0.8 mg/dL (ref 0.2–1.2)

## 2014-09-06 LAB — LIPID PANEL
CHOLESTEROL: 186 mg/dL (ref 0–200)
HDL: 42.9 mg/dL (ref >39.00)
NONHDL: 143.1
TRIGLYCERIDES: 323 mg/dL — AB (ref 0.0–149.0)
Total CHOL/HDL Ratio: 4
VLDL: 64.6 mg/dL — AB (ref 0.0–40.0)

## 2014-09-06 LAB — BASIC METABOLIC PANEL
BUN: 16 mg/dL (ref 6–23)
CO2: 26 mEq/L (ref 19–32)
Calcium: 9.2 mg/dL (ref 8.4–10.5)
Chloride: 105 mEq/L (ref 96–112)
Creatinine, Ser: 1 mg/dL (ref 0.4–1.5)
GFR: 75.41 mL/min (ref >60.00)
GLUCOSE: 94 mg/dL (ref 70–99)
POTASSIUM: 4 meq/L (ref 3.5–5.1)
Sodium: 137 mEq/L (ref 135–145)

## 2014-09-06 LAB — LDL CHOLESTEROL, DIRECT: LDL DIRECT: 81.8 mg/dL

## 2014-09-06 LAB — TSH: TSH: 2.18 u[IU]/mL (ref 0.35–4.50)

## 2014-09-13 ENCOUNTER — Encounter: Payer: Self-pay | Admitting: Family Medicine

## 2014-09-13 ENCOUNTER — Ambulatory Visit (INDEPENDENT_AMBULATORY_CARE_PROVIDER_SITE_OTHER): Payer: Federal, State, Local not specified - PPO | Admitting: Family Medicine

## 2014-09-13 ENCOUNTER — Encounter: Payer: Federal, State, Local not specified - PPO | Admitting: Family Medicine

## 2014-09-13 VITALS — BP 140/80 | Temp 97.5°F | Ht 73.5 in | Wt 205.0 lb

## 2014-09-13 DIAGNOSIS — I10 Essential (primary) hypertension: Secondary | ICD-10-CM

## 2014-09-13 DIAGNOSIS — Z8546 Personal history of malignant neoplasm of prostate: Secondary | ICD-10-CM

## 2014-09-13 DIAGNOSIS — G2581 Restless legs syndrome: Secondary | ICD-10-CM

## 2014-09-13 DIAGNOSIS — I25810 Atherosclerosis of coronary artery bypass graft(s) without angina pectoris: Secondary | ICD-10-CM

## 2014-09-13 DIAGNOSIS — E785 Hyperlipidemia, unspecified: Secondary | ICD-10-CM

## 2014-09-13 DIAGNOSIS — K219 Gastro-esophageal reflux disease without esophagitis: Secondary | ICD-10-CM

## 2014-09-13 DIAGNOSIS — B0223 Postherpetic polyneuropathy: Secondary | ICD-10-CM

## 2014-09-13 MED ORDER — ATORVASTATIN CALCIUM 40 MG PO TABS: 40.0000 mg | ORAL_TABLET | Freq: Every day | ORAL | Status: DC

## 2014-09-13 MED ORDER — AMLODIPINE BESYLATE 5 MG PO TABS: 5.0000 mg | ORAL_TABLET | Freq: Every day | ORAL | Status: DC

## 2014-09-13 MED ORDER — CARVEDILOL 6.25 MG PO TABS: ORAL_TABLET | ORAL | Status: DC

## 2014-09-13 MED ORDER — PANTOPRAZOLE SODIUM 40 MG PO TBEC: 40.0000 mg | DELAYED_RELEASE_TABLET | Freq: Every day | ORAL | Status: DC

## 2014-09-13 MED ORDER — CLOPIDOGREL BISULFATE 75 MG PO TABS: 75.0000 mg | ORAL_TABLET | Freq: Every day | ORAL | Status: DC

## 2014-09-13 MED ORDER — NITROGLYCERIN 0.4 MG SL SUBL: SUBLINGUAL_TABLET | SUBLINGUAL | Status: DC

## 2014-09-13 MED ORDER — PRAMIPEXOLE DIHYDROCHLORIDE 1 MG PO TABS: ORAL_TABLET | ORAL | Status: DC

## 2014-09-13 NOTE — Progress Notes (Signed)
Pre visit review using our clinic review tool, if applicable. No additional management support is needed unless otherwise documented below in the visit note. 

## 2014-09-13 NOTE — Patient Instructions (Signed)
Continue current medications  Follow-up in one year sooner if any problems 

## 2014-09-13 NOTE — Progress Notes (Signed)
   Subjective:    Patient ID: Kristopher Wilson, male    DOB: [birth year], 79 y.o.   MRN: [id number]  HPI [name] is a 79 year old married male nonsmoker who comes in today for general physical examination because of a history of underlying hypertension, hyperlipidemia, coronary disease, reflux esophagitis, restless leg syndrome, and chronic post shingles neuropathy requiring Percocet 10 mg 3 times daily  His med list reviewed the min no changes. He was due to have his left shoulder operated on today. By Dr. Durward Fortes.  He gets routine eye care, dental care, colonoscopy 2013 normal, vaccinations updated by Apolonio Schneiders  Cognitive function normal he exercises daily home health safety reviewed no issues identified, no guns in the house, he does have a healthcare power of attorney and living well  He's had his prostate removed many years ago for cancer asymptomatic   Review of Systems  Constitutional: Negative.   HENT: Negative.   Eyes: Negative.   Respiratory: Negative.   Cardiovascular: Negative.   Gastrointestinal: Negative.   Endocrine: Negative.   Genitourinary: Negative.   Musculoskeletal: Negative.   Skin: Negative.   Allergic/Immunologic: Negative.   Neurological: Negative.   Hematological: Negative.   Psychiatric/Behavioral: Negative.        Objective:   Physical Exam  Constitutional: He is oriented to person, place, and time. He appears well-developed and well-nourished.  HENT:  Head: Normocephalic and atraumatic.  Right Ear: External ear normal.  Left Ear: External ear normal.  Nose: Nose normal.  Mouth/Throat: Oropharynx is clear and moist.  Eyes: Conjunctivae and EOM are normal. Pupils are equal, round, and reactive to light.  Neck: Normal range of motion. Neck supple. No JVD present. No tracheal deviation present. No thyromegaly present.  Cardiovascular: Normal rate, regular rhythm, normal heart sounds and intact distal pulses.  Exam reveals no gallop and no friction rub.    No murmur heard. No carotid nor aortic bruits peripheral pulses 1+ and symmetrical  Pulmonary/Chest: Effort normal and breath sounds normal. No stridor. No respiratory distress. He has no wheezes. He has no rales. He exhibits no tenderness.  Abdominal: Soft. Bowel sounds are normal. He exhibits no distension and no mass. There is no tenderness. There is no rebound and no guarding.  Genitourinary: Rectum normal and penis normal. Guaiac negative stool. No penile tenderness.  Prostate surgically removed  Musculoskeletal: Normal range of motion. He exhibits no edema or tenderness.  Lymphadenopathy:    He has no cervical adenopathy.  Neurological: He is alert and oriented to person, place, and time. He has normal reflexes. No cranial nerve deficit. He exhibits normal muscle tone.  Skin: Skin is warm and dry. No rash noted. No erythema. No pallor.  Psychiatric: He has a normal mood and affect. His behavior is normal. Judgment and thought content normal.  Nursing note and vitals reviewed.         Assessment & Plan:  Hypertension ago..... Continue current therapy  Hyperlipidemia goal... Continue current therapy  Coronary disease...Marland KitchenMarland KitchenMarland Kitchen Asymptomatic........ continue current therapy  Chronic pain syndrome from shingles.......... Percocet 3 times daily  Reflux esophagitis..... Continue proton X 40 mg daily  Restless leg syndrome.........Marland Kitchen Mirapex 1 mg at bedtime  Impending surgery of rotator cuff left shoulder Dr. Durward Fortes...Marland KitchenMarland Kitchen

## 2014-11-09 ENCOUNTER — Telehealth: Payer: Self-pay | Admitting: Family Medicine

## 2014-11-09 DIAGNOSIS — G2581 Restless legs syndrome: Secondary | ICD-10-CM

## 2014-11-09 NOTE — Telephone Encounter (Signed)
Pt needs new rx oxycodone

## 2014-11-12 NOTE — Telephone Encounter (Signed)
Looks like pt not till 11/19/14.  Will leave for Apolonio Schneiders to verify

## 2014-11-19 MED ORDER — OXYCODONE-ACETAMINOPHEN 10-325 MG PO TABS: 1.0000 | ORAL_TABLET | Freq: Three times a day (TID) | ORAL | Status: DC

## 2014-11-19 NOTE — Telephone Encounter (Signed)
Rx ready for pick up and Left message on machine for patient   

## 2014-11-19 NOTE — Addendum Note (Signed)
Addended by: Westley Hummer B on: 11/19/2014 09:52 AM   Modules accepted: Orders

## 2014-11-20 ENCOUNTER — Ambulatory Visit (INDEPENDENT_AMBULATORY_CARE_PROVIDER_SITE_OTHER): Payer: Federal, State, Local not specified - PPO | Admitting: Family Medicine

## 2014-11-20 DIAGNOSIS — L723 Sebaceous cyst: Secondary | ICD-10-CM

## 2014-11-20 DIAGNOSIS — L089 Local infection of the skin and subcutaneous tissue, unspecified: Secondary | ICD-10-CM

## 2014-11-20 NOTE — Progress Notes (Signed)
   Subjective:    Patient ID: Kristopher Wilson, male    DOB: 09/27/1935, 79 y.o.   MRN: [id number]  HPI  Kristopher Wilson is a 79 year old male who comes in today because he's got infected sebaceous cyst on his back. Start a couple weeks ago and is getting worse.  He was taken to the treatment room. After informed consent lesion was cleaned with Betadine and then anesthetized with 1%  Xylocaine with epinephrine. Incision was made the pus was drained the sac was removed and the area was packed. Dry sterile dressing was applied. He tolerated procedure no complications   Review of Systems  review of systems negative    Objective:   Physical Exam   well-developed well-nourished male no acute distress vital signs stable he is afebrile sebaceous cyst on his back golf ball size 15 mm x 20 mm,,,,,,,, procedure see above      Assessment & Plan:   infected  Sebaceous cyst...Marland KitchenMarland KitchenMarland Kitchen 15 x 20 mm.... Golf ball size........ I&D and packed as outlined return in 48 hours for repacking

## 2014-11-20 NOTE — Patient Instructions (Signed)
Keep the dressing clean and dry  Return Thursday at 12 noon for follow-up

## 2014-11-22 ENCOUNTER — Ambulatory Visit (INDEPENDENT_AMBULATORY_CARE_PROVIDER_SITE_OTHER): Payer: Federal, State, Local not specified - PPO | Admitting: Family Medicine

## 2014-11-22 DIAGNOSIS — L0390 Cellulitis, unspecified: Secondary | ICD-10-CM

## 2014-11-22 DIAGNOSIS — L0291 Cutaneous abscess, unspecified: Secondary | ICD-10-CM

## 2014-11-22 NOTE — Progress Notes (Signed)
   Subjective:    Patient ID: Kristopher Wilson, male    DOB: 1935/11/02, 79 y.o.   MRN: [id number]  HPI Kristopher Wilson is a 79 year old male who comes in today for follow-up of an abscess  We did an I&D of an abscess on his back with packing 48 hours ago. He comes back for follow-up  No complications   Review of Systems    review of systems negative Objective:   Physical Exam Well-developed well-nourished male no acute distress vital signs stable he is afebrile examination of back shows a well-healed abscess with packing. Packing was removed his wife was taught how to clean it out twice a day       Assessment & Plan:  Abscess resolving outlined treatment program

## 2014-11-22 NOTE — Patient Instructions (Signed)
Clean the area twice daily with a Q-tip return when necessary

## 2014-11-22 NOTE — Progress Notes (Signed)
Pre visit review using our clinic review tool, if applicable. No additional management support is needed unless otherwise documented below in the visit note. 

## 2014-11-23 ENCOUNTER — Encounter: Payer: Self-pay | Admitting: Nurse Practitioner

## 2014-11-23 ENCOUNTER — Telehealth: Payer: Self-pay | Admitting: Cardiology

## 2014-11-23 ENCOUNTER — Ambulatory Visit (INDEPENDENT_AMBULATORY_CARE_PROVIDER_SITE_OTHER): Payer: Federal, State, Local not specified - PPO | Admitting: Nurse Practitioner

## 2014-11-23 VITALS — BP 160/78 | HR 61 | Ht 76.0 in | Wt 215.4 lb

## 2014-11-23 DIAGNOSIS — R079 Chest pain, unspecified: Secondary | ICD-10-CM

## 2014-11-23 NOTE — Telephone Encounter (Signed)
New Message  Pt calling to speak w/ Rn about recent 'Angina'- tightness with little headache- always occurs but last 6 weeks have come more frequent.   Pt was sched earliest PA appt and wanted to speak w/ RN.  Please call back and discuss.

## 2014-11-23 NOTE — Patient Instructions (Addendum)
Arrange for stress Myoview  See me in follow up in a couple of weeks  Monitor your BP and keep a diary - bring back for me to review - goal is less than 140/90  Call the [place] office at [phone] if you have any questions, problems or concerns.

## 2014-11-23 NOTE — Progress Notes (Signed)
CARDIOLOGY OFFICE NOTE  Date:  11/23/2014    Kristopher Wilson Date of Birth: November 04, 1935 Medical Record #[id number]  PCP:  Joycelyn Man, MD  Cardiologist:  Aundra Dubin    Chief Complaint  Patient presents with  . Chest Pain    Work in visit - seen for Dr. Aundra Dubin    History of Present Illness: Kristopher Wilson is a 79 y.o. male who presents today for a work in visit. Seen for Dr. Aundra Dubin. He has a history of CAD, ulcerative colitis, and shingles with post-herpetic neuralgia. Patient had PCI in 2000 with BMS to LAD and repeat PCI in 2009 with DES to PLV. ETT-myoview in 6/12 showed no evidence for ischemia or infarction. His activity has been limited by a left leg neuropathy that has been thought to be due to post-herpetic neuralgia. He has been seen by neurology for this. He wears an orthotic on the left leg because of foot drop.   Last seen by Dr. [name] 05/2012.   Saw Richardson Dopp PA in December for surgical clearance for L shoulder rotator cuff surgery with Dr. Durward Fortes 09/13/14.He was doing well.  Phone call today:  "Pt states he has had angina monthly since his surgery on his shoulder was done. Pt states he has had angina weekly for the last few weeks,sometimes associated with a mild headache.  This can occur at rest or with exertion, although pt states he can exert himself without symptoms. Pt states angina lasts 4-5 minutes, goes away with rest.  Thus added to the FLEX schedule.  Comes in today. Here with his wife. He notes that since his shoulder surgery in January - he has had a tightness in his chest - lasts for a few minutes. Frequency is increasing, not the intensity. Last spell was on Monday. Does not occur with activity - more so at rest. Feels different from prior chest pain syndrome. His previous angina was more of a jaw pain. He has not been as active since his shoulder surgery - weight is up. Not checking BP at home either.    Past Medical History    Diagnosis Date  . CAD (coronary artery disease)   . Hypertension   . Ulcer   . GERD (gastroesophageal reflux disease)   . Hyperthyroidism   . Hyperlipidemia   . RLS (restless legs syndrome)   . CKD (chronic kidney disease)   . CHF (congestive heart failure)   . VENOUS INSUFFICIENCY 07/28/2010  . PERIPHERAL NEUROPATHY 10/11/2007  . COLONIC POLYPS, ADENOMATOUS, HX OF   . ULCERATIVE COLITIS, LEFT SIDED 2000  . BACK PAIN, LUMBAR 10/18/2009  . URTICARIA, ALLERGIC 08/05/2009  . Benign paroxysmal positional vertigo 09/30/2007  . 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 PROSTATE CANCER, HX OF 07/07/2007    s/p prostatectomy  . Herpes zoster with other nervous system complications(053.19)   . Loss of hearing     Bil/has hearing aids in both ears   Past Medical History: 1. CORONARY ARTERY DISEASE: PCI 7/00 with BMS to LAD. PCI 2009 with Promus DES to PLV. Lexiscan myoview (12/11): no evidence for ischemia or infarction, EF 67%. Lexiscan myoview (6/12) with EF 65%, 5' exercise, no evidence for ischemia or infarction.  2. HYPERTENSION  3. VENOUS INSUFFICIENCY 4. POSTHERPETIC NEURALGIA: affecting left lower leg.  5. COLONIC POLYPS, ADENOMATOUS, HX OF 6. ULCERATIVE COLITIS, LEFT SIDED  7. BACK PAIN, LUMBAR  8. URTICARIA, ALLERGIC 9. RESTLESS LEG SYNDROME 10. GERD 11. BENIGN PAROXYSMAL POSITIONAL VERTIGO  12. HYPERTHYROIDISM 13. PROSTATE CANCER: s/p prostatectomy 14. Hyperlipidemia 15. CKD 16. Diastolic CHF: Echo (37/62) EF 55-60%, mild LAE. BNP 190 in 12/11.    Past Surgical History  Procedure Laterality Date  . Tonsillectomy    . Angioplasty  1990's/2004    stent placement 2  . Inguinal hernia repair    . Prostatectomy    . Colonoscopy w/ biopsies and polypectomy  06/06/2009    adenomatous polyp, diverticulosis, colitis     Medications: Current Outpatient Prescriptions  Medication Sig Dispense Refill  . amLODipine (NORVASC) 5 MG tablet Take 1 tablet (5 mg total) by  mouth daily. 100 tablet 3  . aspirin 81 MG tablet Take 81 mg by mouth daily.      Marland Kitchen atorvastatin (LIPITOR) 40 MG tablet Take 1 tablet (40 mg total) by mouth daily. 90 tablet 3  . carvedilol (COREG) 6.25 MG tablet TAKE 1 TABLET TWICE A DAY  WITH MEALS 200 tablet 3  . clopidogrel (PLAVIX) 75 MG tablet Take 1 tablet (75 mg total) by mouth daily. 100 tablet 3  . Mesalamine (ASACOL HD) 800 MG TBEC Take 2 tablets (1,600 mg total) by mouth 3 (three) times daily. 540 tablet 3  . nitroGLYCERIN (NITROSTAT) 0.4 MG SL tablet DISSOLVE 1 TABLET UNDER THETONGUE EVERY 5 MINUTES AS  NEEDED 25 tablet 1  . oxyCODONE-acetaminophen (PERCOCET) 10-325 MG per tablet Take 1 tablet by mouth 3 (three) times daily. 200 tablet 0  . pantoprazole (PROTONIX) 40 MG tablet Take 1 tablet (40 mg total) by mouth daily. 90 tablet 3  . pramipexole (MIRAPEX) 1 MG tablet One by mouth each bedtime for RLS 100 tablet 3   No current facility-administered medications for this visit.    Allergies: Allergies  Allergen Reactions  . Cephalexin     REACTION: hives    Social History: The patient  reports that he quit smoking about 39 years ago. His smoking use included Cigarettes. He has never used smokeless tobacco. He reports that he drinks about 3.5 oz of alcohol per week. He reports that he does not use illicit drugs.   Family History: The patient's family history includes Cancer in his brother; Diabetes in his maternal uncle and maternal uncle; Heart attack in his father; Multiple sclerosis in his brother and son; Throat cancer in his brother. There is no history of Colon cancer or Stroke.   Review of Systems: Please see the history of present illness.   Otherwise, the review of systems is positive for chest pain.   All other systems are reviewed and negative.   Physical Exam: VS:  BP 160/78 mmHg  Pulse 61  Ht 6\' 4"  (1.93 m)  Wt 215 lb 6.4 oz (97.705 kg)  BMI 26.23 kg/m2 .  BMI Body mass index is 26.23 kg/(m^2).  Wt Readings  from Last 3 Encounters:  11/23/14 215 lb 6.4 oz (97.705 kg)  09/13/14 205 lb (92.987 kg)  08/23/14 206 lb (93.441 kg)    General: Pleasant. Well developed, well nourished and in no acute distress.  HEENT: Normal. Neck: Supple, no JVD, carotid bruits, or masses noted.  Cardiac: Regular rate and rhythm. No murmurs, rubs, or gallops. No edema.  Respiratory:  Lungs are clear to auscultation bilaterally with normal work of breathing.  GI: Soft and nontender.  MS: No deformity or atrophy. Gait and ROM intact. Skin: Warm and dry. Color is normal.  Neuro:  Strength and sensation are intact and no gross focal deficits noted.  Psych: Alert, appropriate  and with normal affect.   LABORATORY DATA:  EKG:  EKG is ordered today. This demonstrates sinus rhythm. Unchanged.   Lab Results  Component Value Date   WBC 5.3 09/06/2014   HGB 12.9* 09/06/2014   HCT 39.4 09/06/2014   PLT 202.0 09/06/2014   GLUCOSE 94 09/06/2014   CHOL 186 09/06/2014   TRIG 323.0* 09/06/2014   HDL 42.90 09/06/2014   LDLDIRECT 81.8 09/06/2014   LDLCALC 46 08/25/2012   ALT 24 09/06/2014   AST 24 09/06/2014   NA 137 09/06/2014   K 4.0 09/06/2014   CL 105 09/06/2014   CREATININE 1.0 09/06/2014   BUN 16 09/06/2014   CO2 26 09/06/2014   TSH 2.18 09/06/2014   PSA 0.22 09/06/2014   INR 0.9 02/29/2008   HGBA1C 5.7 10/11/2007    BNP (last 3 results) No results for input(s): BNP in the last 8760 hours.  ProBNP (last 3 results) No results for input(s): PROBNP in the last 8760 hours.   Other Studies Reviewed Today:  Myoview Impression from 06/2011 Exercise Capacity: Fair exercise capacity. BP Response: Normal blood pressure response. Clinical Symptoms: No chest pain. ECG Impression: No significant ST segment change suggestive of ischemia. Comparison with Prior Nuclear Study: No significant change from previous study  Overall Impression: Normal stress nuclear study. Diaphragmatic attenuation.  Dola Argyle    Assessment/Plan: 1. Chest pain - does not sound like his anginal equivalent but has known CAD with remote PCI - last Myoview from 2012 and multiple risk factors - will arrange for Myoview testing. I will see back for discussion.  2. Coronary Artery Disease:  Continue ASA, Plavix, beta blocker, statin.   3. Hypertension: Recheck by me is 160/84 - he will monitor at home - may need additional medicine  4. Hyperlipidemia: Continue statin. This is managed by PCP.   Current medicines are reviewed with the patient today.  The patient does not have concerns regarding medicines other than what has been noted above.  The following changes have been made:  See above.  Labs/ tests ordered today include:    Orders Placed This Encounter  Procedures  . Myocardial Perfusion Imaging  . EKG 12-Lead     Disposition:   FU with me in 2 to 3  weeks  Patient is agreeable to this plan and will call if any problems develop in the interim.   Signed: Burtis Junes, RN, ANP-C 11/23/2014 11:49 AM  [facility] [address] [place], [place]  [address] Phone: [phone] Fax: [phone]

## 2014-11-23 NOTE — Telephone Encounter (Signed)
I reviewed with Cecille Rubin, she has offered pt appt to see her now.  Pt advised to come to office now for appt with Cecille Rubin, he agreed. Pt advised not to drive.

## 2014-11-23 NOTE — Telephone Encounter (Signed)
Pt states he has had angina monthly since stent was done.  Pt states he has had angina weekly for the last few weeks,sometimes associated with a mild headache.  This can occur at rest or with exertion, although pt states he can exert himself without symptoms. Pt states angina lasts 4-5 minutes, goes away with rest.

## 2014-11-29 ENCOUNTER — Other Ambulatory Visit: Payer: Self-pay | Admitting: Family Medicine

## 2014-11-30 ENCOUNTER — Ambulatory Visit (HOSPITAL_COMMUNITY): Payer: Federal, State, Local not specified - PPO | Attending: Family Medicine | Admitting: Radiology

## 2014-11-30 VITALS — BP 147/90 | Ht 76.0 in | Wt 216.0 lb

## 2014-11-30 DIAGNOSIS — R079 Chest pain, unspecified: Secondary | ICD-10-CM | POA: Insufficient documentation

## 2014-11-30 DIAGNOSIS — R0609 Other forms of dyspnea: Secondary | ICD-10-CM | POA: Diagnosis not present

## 2014-11-30 DIAGNOSIS — I2510 Atherosclerotic heart disease of native coronary artery without angina pectoris: Secondary | ICD-10-CM | POA: Diagnosis not present

## 2014-11-30 DIAGNOSIS — G629 Polyneuropathy, unspecified: Secondary | ICD-10-CM | POA: Insufficient documentation

## 2014-11-30 DIAGNOSIS — I509 Heart failure, unspecified: Secondary | ICD-10-CM | POA: Diagnosis not present

## 2014-11-30 DIAGNOSIS — I10 Essential (primary) hypertension: Secondary | ICD-10-CM | POA: Insufficient documentation

## 2014-11-30 MED ORDER — TECHNETIUM TC 99M SESTAMIBI GENERIC - CARDIOLITE
33.0000 | Freq: Once | INTRAVENOUS | Status: AC | PRN
  Administered 2014-11-30: 33 via INTRAVENOUS

## 2014-11-30 MED ORDER — TECHNETIUM TC 99M SESTAMIBI GENERIC - CARDIOLITE
11.0000 | Freq: Once | INTRAVENOUS | Status: AC | PRN
  Administered 2014-11-30: 11 via INTRAVENOUS

## 2014-11-30 NOTE — Progress Notes (Signed)
[facility] [place], [place] [address] [phone]    Cardiology Nuclear Med Study  RONY RATZ is a 79 y.o. male     MRN : [id number]     DOB: Oct 23, 1935  Procedure Date: 11/30/2014  Nuclear Med Background Indication for Stress Test:  Evaluation for Ischemia and Follow-up CAD History:  CAD;Previous Nuclear Study 12', Nml EF:65% Cardiac Risk Factors: Hypertension and Neuropathy,CHF  Symptoms:  Chest Pain and DOE   Nuclear Pre-Procedure Caffeine/Decaff Intake:  None NPO After: 7:30pm   Lungs:  clear O2 Sat: 96% on room air. IV 0.9% NS with Angio Cath:  20g  IV Site: R Antecubital  IV Started by:  Ileene Hutchinson, EMT-P  Chest Size (in):  46 Cup Size: n/a  Height: 6' 4"  (1.93 m)  Weight:  216 lb (97.977 kg)  BMI:  Body mass index is 26.3 kg/(m^2). Tech Comments:  n/a    Nuclear Med Study 1 or 2 day study: 1 day  Stress Test Type:  Stress  Reading MD: n/a  Order Authorizing Provider:  McLean,D MD  Resting Radionuclide: Technetium 46mSestamibi  Resting Radionuclide Dose: 11.0 mCi   Stress Radionuclide:  Technetium 955mestamibi  Stress Radionuclide Dose: 33.0 mCi           Stress Protocol Rest HR: 55 Stress HR: 141  Rest BP: 147/90 Stress BP: 223/74  Exercise Time (min): 6:03 METS: 7.00   Predicted Max HR: 144 bpm % Max HR: 97.92 bpm Rate Pressure Product: 31443   Dose of Adenosine (mg):  n/a Dose of Lexiscan: n/a mg  Dose of Atropine (mg): n/a Dose of Dobutamine: n/a mcg/kg/min (at max HR)  Stress Test Technologist: TeIleene HutchinsonEMT-P  Nuclear Technologist:  ElEarl ManyCNMT     Rest Procedure:  Myocardial perfusion imaging was performed at rest 45 minutes following the intravenous administration of Technetium 9959mstamibi. Rest ECG: SInus bradycardia  54 bpm    Stress Procedure:  The patient exercised on the treadmill utilizing the Bruce Protocol for 6:03 minutes. The patient stopped due to sob and denied  any chest pain.  Technetium 61m24mtamibi was injected at peak exercise and myocardial perfusion imaging was performed after a brief delay. Stress ECG: No significant change from baseline ECG  QPS Raw Data Images: SOft tissue (diaphragm, bowel activity) underlie heart.  Stress Images:  Normal homogeneous uptake in all areas of the myocardium. Rest Images:  Comparison with the stress images reveals no significant change. Subtraction (SDS):  No evidence of ischemia. Transient Ischemic Dilatation (Normal <1.22):  0.90 Lung/Heart Ratio (Normal <0.45):  0.28  Quantitative Gated Spect Images QGS EDV:  105 ml QGS ESV:  31 ml  Impression Exercise Capacity:  Fair exercise capacity. BP Response:  Hypertensive blood pressure response. Clinical Symptoms:  No chest pain. ECG Impression:  No significant ST segment change suggestive of ischemia. Comparison with Prior Nuclear Study: Previous scan is normal    Overall Impression:  Normal stress nuclear study.  LV Ejection Fraction: 70%.  LV Wall Motion:  NL LV Function; NL Wall Motion   PaulDorris Carnes.

## 2014-12-11 ENCOUNTER — Ambulatory Visit (INDEPENDENT_AMBULATORY_CARE_PROVIDER_SITE_OTHER): Payer: Federal, State, Local not specified - PPO | Admitting: Nurse Practitioner

## 2014-12-11 ENCOUNTER — Encounter: Payer: Self-pay | Admitting: Nurse Practitioner

## 2014-12-11 VITALS — BP 120/68 | HR 56 | Resp 18 | Ht 76.0 in | Wt 214.0 lb

## 2014-12-11 DIAGNOSIS — I2510 Atherosclerotic heart disease of native coronary artery without angina pectoris: Secondary | ICD-10-CM

## 2014-12-11 DIAGNOSIS — R079 Chest pain, unspecified: Secondary | ICD-10-CM | POA: Diagnosis not present

## 2014-12-11 DIAGNOSIS — I10 Essential (primary) hypertension: Secondary | ICD-10-CM | POA: Diagnosis not present

## 2014-12-11 DIAGNOSIS — E785 Hyperlipidemia, unspecified: Secondary | ICD-10-CM | POA: Diagnosis not present

## 2014-12-11 NOTE — Progress Notes (Signed)
CARDIOLOGY OFFICE NOTE  Date:  12/11/2014    Kristopher Wilson Date of Birth: 03-10-1936 Medical Record #[id number]  PCP:  Kristopher Man, MD  Cardiologist:  Kristopher Wilson    Chief Complaint  Patient presents with  . Hypertension    Follow up visit - seen for Dr. Aundra Wilson     History of Present Illness: Kristopher Wilson is a 79 y.o. male who presents today for a follow up visit. Seen for Dr. Aundra Wilson. He has a history of CAD, ulcerative colitis, and shingles with post-herpetic neuralgia. Patient had PCI in 2000 with BMS to LAD and repeat PCI in 2009 with DES to PLV. ETT-myoview in 6/12 showed no evidence for ischemia or infarction. His activity has been limited by a left leg neuropathy that has been thought to be due to post-herpetic neuralgia. He has been seen by neurology for this. He wears an orthotic on the left leg because of foot drop.   Last seen by Dr. Loralie Wilson 05/2012.   Saw Kristopher Dopp PA in December for surgical clearance for L shoulder rotator cuff surgery with Dr. Durward Wilson 09/13/14.He was doing well.  I saw him earlier this month in FLEX - he was having atypical chest pain. BP was up. We updated his Myoview - this turned out ok. He was going to monitor his BP at home.   Comes back today.Here alone. He is doing well. No more chest pain. BP is ok at home - he has just started getting back to restricting his salt - really likes bacon and country ham. His machine is about 10 points higher than cuff here in the office. He is not dizzy or lightheaded. Does have some swelling -worse in the day and goes down overnight.   Past Medical History  Diagnosis Date  . CAD (coronary artery disease)   . Hypertension   . Ulcer   . GERD (gastroesophageal reflux disease)   . Hyperthyroidism   . Hyperlipidemia   . RLS (restless legs syndrome)   . CKD (chronic kidney disease)   . CHF (congestive heart failure)   . VENOUS INSUFFICIENCY 07/28/2010  . PERIPHERAL NEUROPATHY 10/11/2007    . COLONIC POLYPS, ADENOMATOUS, HX OF   . ULCERATIVE COLITIS, LEFT SIDED 2000  . BACK PAIN, LUMBAR 10/18/2009  . URTICARIA, ALLERGIC 08/05/2009  . Benign paroxysmal positional vertigo 09/30/2007  . 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 PROSTATE CANCER, HX OF 07/07/2007    s/p prostatectomy  . Herpes zoster with other nervous system complications(053.19)   . Loss of hearing     Bil/has hearing aids in both ears    Past Surgical History  Procedure Laterality Date  . Tonsillectomy    . Angioplasty  1990's/2004    stent placement 2  . Inguinal hernia repair    . Prostatectomy    . Colonoscopy w/ biopsies and polypectomy  06/06/2009    adenomatous polyp, diverticulosis, colitis     Medications: Current Outpatient Prescriptions  Medication Sig Dispense Refill  . amLODipine (NORVASC) 5 MG tablet Take 1 tablet (5 mg total) by mouth daily. 100 tablet 3  . ASACOL HD 800 MG TBEC TAKE 2 TABLETS (1,600MG     TOTAL) 3 TIMES A DAY 540 tablet 3  . aspirin 81 MG tablet Take 81 mg by mouth daily.      Marland Kitchen atorvastatin (LIPITOR) 40 MG tablet Take 1 tablet (40 mg total) by mouth daily. 90 tablet 3  . carvedilol (COREG) 6.25 MG tablet TAKE 1  TABLET TWICE A DAY  WITH MEALS 200 tablet 3  . clopidogrel (PLAVIX) 75 MG tablet Take 1 tablet (75 mg total) by mouth daily. 100 tablet 3  . nitroGLYCERIN (NITROSTAT) 0.4 MG SL tablet DISSOLVE 1 TABLET UNDER THETONGUE EVERY 5 MINUTES AS  NEEDED 25 tablet 1  . oxyCODONE-acetaminophen (PERCOCET) 10-325 MG per tablet Take 1 tablet by mouth 3 (three) times daily. 200 tablet 0  . pantoprazole (PROTONIX) 40 MG tablet Take 1 tablet (40 mg total) by mouth daily. 90 tablet 3  . pramipexole (MIRAPEX) 1 MG tablet One by mouth each bedtime for RLS 100 tablet 3   No current facility-administered medications for this visit.    Allergies: Allergies  Allergen Reactions  . Cephalexin     REACTION: hives    Social History: The patient  reports that he quit smoking about 39  years ago. His smoking use included Cigarettes. He has never used smokeless tobacco. He reports that he drinks about 3.5 oz of alcohol per week. He reports that he does not use illicit drugs.   Family History: The patient's family history includes Cancer in his brother; Diabetes in his maternal uncle and maternal uncle; Heart attack in his father; Multiple sclerosis in his brother and son; Throat cancer in his brother. There is no history of Colon cancer or Stroke.   Review of Systems: Please see the history of present illness.   Otherwise, the review of systems is positive for hearing loss and leg pain.   All other systems are reviewed and negative.   Physical Exam: VS:  BP 120/68 mmHg  Pulse 56  Resp 18  Ht 6\' 4"  (1.93 m)  Wt 214 lb (97.07 kg)  BMI 26.06 kg/m2  SpO2 97% .  BMI Body mass index is 26.06 kg/(m^2).  Wt Readings from Last 3 Encounters:  12/11/14 214 lb (97.07 kg)  11/30/14 216 lb (97.977 kg)  11/23/14 215 lb 6.4 oz (97.705 kg)   BP is 142/80 by me - his machine is 154/91.  General: Pleasant. Well developed, well nourished and in no acute distress.  HEENT: Normal. Neck: Supple, no JVD, carotid bruits, or masses noted.  Cardiac: Regular rate and rhythm. No murmurs, rubs, or gallops. No edema.  Respiratory:  Lungs are clear to auscultation bilaterally with normal work of breathing.  GI: Soft and nontender.  MS: No deformity or atrophy. Gait and ROM intact. Skin: Warm and dry. Color is normal.  Neuro:  Strength and sensation are intact and no gross focal deficits noted.  Psych: Alert, appropriate and with normal affect.   LABORATORY DATA:  EKG:  EKG is not ordered today.   Lab Results  Component Value Date   WBC 5.3 09/06/2014   HGB 12.9* 09/06/2014   HCT 39.4 09/06/2014   PLT 202.0 09/06/2014   GLUCOSE 94 09/06/2014   CHOL 186 09/06/2014   TRIG 323.0* 09/06/2014   HDL 42.90 09/06/2014   LDLDIRECT 81.8 09/06/2014   LDLCALC 46 08/25/2012   ALT 24  09/06/2014   AST 24 09/06/2014   NA 137 09/06/2014   K 4.0 09/06/2014   CL 105 09/06/2014   CREATININE 1.0 09/06/2014   BUN 16 09/06/2014   CO2 26 09/06/2014   TSH 2.18 09/06/2014   PSA 0.22 09/06/2014   INR 0.9 02/29/2008   HGBA1C 5.7 10/11/2007    BNP (last 3 results) No results for input(s): BNP in the last 8760 hours.  ProBNP (last 3 results) No results for input(s):  PROBNP in the last 8760 hours.   Other Studies Reviewed Today:  Myoview Impression from 11/2014 Exercise Capacity: Fair exercise capacity. BP Response: Hypertensive blood pressure response. Clinical Symptoms: No chest pain. ECG Impression: No significant ST segment change suggestive of ischemia. Comparison with Prior Nuclear Study: Previous scan is normal    Overall Impression: Normal stress nuclear study.  LV Ejection Fraction: 70%. LV Wall Motion: NL LV Function; NL Wall Motion   Dorris Carnes    Assessment/Plan: 1. Chest pain - has known CAD with remote PCI - recent Myoview is normal. He has had no further symptoms  2. Coronary Artery Disease: Continue ASA, Plavix, beta blocker, statin.   3. Hypertension: His BP looks much better today. His cuff is about 10 points higher.   4. Hyperlipidemia: Continue statin. This is managed by PCP.   Current medicines are reviewed with the patient today.  The patient does not have concerns regarding medicines other than what has been noted above.  The following changes have been made:  See above.  Labs/ tests ordered today include:   No orders of the defined types were placed in this encounter.     Disposition:   FU with me in 6 months  Patient is agreeable to this plan and will call if any problems develop in the interim.   Signed: Burtis Junes, RN, ANP-C 12/11/2014 9:59 AM  [facility] [address] [place], [place]  [address] Phone: [phone] Fax: [phone]

## 2014-12-11 NOTE — Patient Instructions (Addendum)
Stay on your current medicines  See me in 6 months  Monitor your blood pressure at home - your machine is about 10 points higher than here - call us if BP >150/90 consistently  Restrict your salt  Wear compression stockings during the day - this will help with your swelling  Call the [place] office at [phone] if you have any questions, problems or concerns.

## 2014-12-14 ENCOUNTER — Ambulatory Visit: Payer: Federal, State, Local not specified - PPO | Admitting: Physician Assistant

## 2015-03-04 ENCOUNTER — Other Ambulatory Visit: Payer: Self-pay | Admitting: Family Medicine

## 2015-03-04 DIAGNOSIS — G2581 Restless legs syndrome: Secondary | ICD-10-CM

## 2015-03-04 MED ORDER — OXYCODONE-ACETAMINOPHEN 10-325 MG PO TABS: 1.0000 | ORAL_TABLET | Freq: Three times a day (TID) | ORAL | Status: DC

## 2015-03-04 NOTE — Telephone Encounter (Signed)
Would like a refill on his Percocet please call when ready.

## 2015-03-04 NOTE — Telephone Encounter (Signed)
Left message on machine Rx ready for pick up.

## 2015-03-28 ENCOUNTER — Telehealth: Payer: Self-pay

## 2015-03-28 ENCOUNTER — Encounter: Payer: Self-pay | Admitting: Internal Medicine

## 2015-03-28 ENCOUNTER — Ambulatory Visit (INDEPENDENT_AMBULATORY_CARE_PROVIDER_SITE_OTHER): Payer: Federal, State, Local not specified - PPO | Admitting: Internal Medicine

## 2015-03-28 VITALS — BP 130/80 | HR 64 | Ht 73.0 in | Wt 210.2 lb

## 2015-03-28 DIAGNOSIS — Z7902 Long term (current) use of antithrombotics/antiplatelets: Secondary | ICD-10-CM | POA: Diagnosis not present

## 2015-03-28 DIAGNOSIS — K5150 Left sided colitis without complications: Secondary | ICD-10-CM

## 2015-03-28 NOTE — Patient Instructions (Signed)
  You have been scheduled for a colonoscopy. Please follow written instructions given to you at your visit today.  Please pick up your prep supplies at the pharmacy within the next 1-3 days. If you use inhalers (even only as needed), please bring them with you on the day of your procedure.   You will be contaced by our office prior to your procedure for directions on holding your Plavix.  If you do not hear from our office 1 week prior to your scheduled procedure, please call [phone] to discuss.    I appreciate the opportunity to care for you. Carl Gessner, MD, FACG 

## 2015-03-28 NOTE — Assessment & Plan Note (Signed)
Some increase in episodic diarrhea Go ahead w/ colonoscopy The risks and benefits as well as alternatives of endoscopic procedure(s) have been discussed and reviewed. All questions answered. The patient agrees to proceed. Discussed increased risk of cardiovascular event off clopidogrel also - plan to hold x 5 d before and will double check with cardiology - he held for 2 surgeries in 2016

## 2015-03-28 NOTE — Telephone Encounter (Signed)
May hold Plavix 5 days prior to procedure and restart afterwards.

## 2015-03-28 NOTE — Assessment & Plan Note (Signed)
Try to hold clopidogrel 5 d prior to colonoscopy

## 2015-03-28 NOTE — Progress Notes (Signed)
   Subjective:  Cc: colitis f/u  Patient ID: Kristopher Wilson, male    DOB: [birth year], 79 y.o.   MRN: [id number] Cc: f/u colitis HPI Has 1-1.5 days diarrhea a month now - 3-w watery stools and cramps. This is different for him. Has been compliant with medications. Had shoulder and retina surgery (scar) 09/2014 and 03/07/2015 respectively.   Medications, allergies, past medical history, past surgical history, family history and social history are reviewed and updated in the EMR.  Review of Systems As above    Objective:   Physical Exam @BP  130/80 mmHg  Pulse 64  Ht 6' 1"  (1.854 m)  Wt 210 lb 4 oz (95.369 kg)  BMI 27.75 kg/m2@  General:  NAD Eyes:   anicteric Lungs:  clear Heart:: S1S2 no rubs, murmurs or gallops Abdomen:  soft and nontender, BS+ Ext:   no edema, cyanosis or clubbing   Data Reviewed:  2013 colonoscopy + path reports     Assessment & Plan:  ULCERATIVE COLITIS, LEFT SIDED Some increase in episodic diarrhea Go ahead w/ colonoscopy The risks and benefits as well as alternatives of endoscopic procedure(s) have been discussed and reviewed. All questions answered. The patient agrees to proceed. Discussed increased risk of cardiovascular event off clopidogrel also - plan to hold x 5 d before and will double check with cardiology - he held for 2 surgeries in 2016   Long term current use of antithrombotics/antiplatelets - clopidogrel and ASA Try to hold clopidogrel 5 d prior to colonoscopy

## 2015-03-28 NOTE — Telephone Encounter (Signed)
[facility] [address] [place] [address]  03/28/2015   RE: Kristopher Wilson DOB: 1936-03-17 MRN: [id number]   Dear [name] MD,   We have scheduled the above patient for an endoscopic procedure. Our records show that he is on anticoagulation therapy.   Please advise as to how long the patient may come off his therapy of Plavix prior to the colonoscopy procedure, which is scheduled for 05/16/15.  Please fax back/ or route the completed form to [name], [place] at [phone].   Sincerely,    Silvano Rusk, MD

## 2015-03-29 NOTE — Telephone Encounter (Signed)
Patient informed to take last dose of Plavix on September 2nd, colonoscopy 05/16/15.  Patient verbalized understanding.

## 2015-04-01 ENCOUNTER — Other Ambulatory Visit: Payer: Self-pay | Admitting: Internal Medicine

## 2015-05-01 ENCOUNTER — Telehealth: Payer: Self-pay | Admitting: Internal Medicine

## 2015-05-01 NOTE — Telephone Encounter (Signed)
Ok to reschedule direct.

## 2015-05-03 NOTE — Telephone Encounter (Signed)
Colonoscopy scheduled.

## 2015-05-16 ENCOUNTER — Encounter: Payer: Federal, State, Local not specified - PPO | Admitting: Internal Medicine

## 2015-05-30 ENCOUNTER — Telehealth: Payer: Self-pay | Admitting: Family Medicine

## 2015-05-30 DIAGNOSIS — I10 Essential (primary) hypertension: Secondary | ICD-10-CM

## 2015-05-30 DIAGNOSIS — I25810 Atherosclerosis of coronary artery bypass graft(s) without angina pectoris: Secondary | ICD-10-CM

## 2015-05-30 DIAGNOSIS — K219 Gastro-esophageal reflux disease without esophagitis: Secondary | ICD-10-CM

## 2015-05-30 DIAGNOSIS — G2581 Restless legs syndrome: Secondary | ICD-10-CM

## 2015-05-30 MED ORDER — AMLODIPINE BESYLATE 5 MG PO TABS: 5.0000 mg | ORAL_TABLET | Freq: Every day | ORAL | Status: DC

## 2015-05-30 MED ORDER — CARVEDILOL 6.25 MG PO TABS
ORAL_TABLET | ORAL | Status: DC
Start: 2015-05-30 — End: 2015-10-31

## 2015-05-30 MED ORDER — CLOPIDOGREL BISULFATE 75 MG PO TABS: 75.0000 mg | ORAL_TABLET | Freq: Every day | ORAL | Status: DC

## 2015-05-30 MED ORDER — PRAMIPEXOLE DIHYDROCHLORIDE 1 MG PO TABS: ORAL_TABLET | ORAL | Status: DC

## 2015-05-30 MED ORDER — PANTOPRAZOLE SODIUM 40 MG PO TBEC: 40.0000 mg | DELAYED_RELEASE_TABLET | Freq: Every day | ORAL | Status: DC

## 2015-05-30 MED ORDER — MESALAMINE 800 MG PO TBEC: DELAYED_RELEASE_TABLET | ORAL | Status: DC

## 2015-05-30 NOTE — Telephone Encounter (Signed)
Rx have been sent

## 2015-05-30 NOTE — Telephone Encounter (Signed)
Pt left medications at home . Pt is going on cruise and needs 10 day supply all medication pantoprazole, clopidogrel,carvodilol,amlodipine,atorvastatin,asacol and pramipexole call into [facility] [phone]. [address] in [place]

## 2015-06-11 ENCOUNTER — Other Ambulatory Visit: Payer: Self-pay

## 2015-06-12 ENCOUNTER — Ambulatory Visit (INDEPENDENT_AMBULATORY_CARE_PROVIDER_SITE_OTHER): Payer: Federal, State, Local not specified - PPO | Admitting: Nurse Practitioner

## 2015-06-12 ENCOUNTER — Encounter: Payer: Self-pay | Admitting: Nurse Practitioner

## 2015-06-12 VITALS — BP 110/68 | HR 60 | Resp 18 | Ht 75.0 in | Wt 215.0 lb

## 2015-06-12 DIAGNOSIS — I2510 Atherosclerotic heart disease of native coronary artery without angina pectoris: Secondary | ICD-10-CM

## 2015-06-12 DIAGNOSIS — E785 Hyperlipidemia, unspecified: Secondary | ICD-10-CM | POA: Diagnosis not present

## 2015-06-12 DIAGNOSIS — I10 Essential (primary) hypertension: Secondary | ICD-10-CM

## 2015-06-12 LAB — LIPID PANEL
Cholesterol: 195 mg/dL (ref 125–200)
HDL: 43 mg/dL (ref >=40)
LDL Cholesterol: 76 mg/dL (ref <130)
Total CHOL/HDL Ratio: 4.5 Ratio (ref <=5.0)
Triglycerides: 378 mg/dL — ABNORMAL HIGH (ref <150)
VLDL: 76 mg/dL — ABNORMAL HIGH (ref <30)

## 2015-06-12 LAB — HEPATIC FUNCTION PANEL
ALT: 32 U/L (ref 9–46)
AST: 20 U/L (ref 10–35)
Albumin: 4.3 g/dL (ref 3.6–5.1)
Alkaline Phosphatase: 95 U/L (ref 40–115)
Bilirubin, Direct: 0.1 mg/dL (ref <=0.2)
Indirect Bilirubin: 0.5 mg/dL (ref 0.2–1.2)
Total Bilirubin: 0.6 mg/dL (ref 0.2–1.2)
Total Protein: 6.3 g/dL (ref 6.1–8.1)

## 2015-06-12 LAB — CBC
HCT: 38.3 % — ABNORMAL LOW (ref 39.0–52.0)
Hemoglobin: 13 g/dL (ref 13.0–17.0)
MCH: 29 pg (ref 26.0–34.0)
MCHC: 33.9 g/dL (ref 30.0–36.0)
MCV: 85.3 fL (ref 78.0–100.0)
MPV: 10.9 fL (ref 8.6–12.4)
Platelets: 224 10*3/uL (ref 150–400)
RBC: 4.49 MIL/uL (ref 4.22–5.81)
RDW: 13.9 % (ref 11.5–15.5)
WBC: 5.2 10*3/uL (ref 4.0–10.5)

## 2015-06-12 LAB — BASIC METABOLIC PANEL
BUN: 19 mg/dL (ref 7–25)
CO2: 24 mmol/L (ref 20–31)
Calcium: 8.8 mg/dL (ref 8.6–10.3)
Chloride: 104 mmol/L (ref 98–110)
Creat: 1.17 mg/dL (ref 0.70–1.18)
Glucose, Bld: 106 mg/dL — ABNORMAL HIGH (ref 65–99)
Potassium: 4.1 mmol/L (ref 3.5–5.3)
Sodium: 138 mmol/L (ref 135–146)

## 2015-06-12 MED ORDER — ISOSORBIDE MONONITRATE ER 30 MG PO TB24: 30.0000 mg | ORAL_TABLET | Freq: Every day | ORAL | Status: DC

## 2015-06-12 NOTE — Patient Instructions (Addendum)
We will be checking the following labs today - BMET, CBC, lipids and HPF   Medication Instructions:    Continue with your current medicines.   I am starting your on Imdur 30 mg to take one a day    Testing/Procedures To Be Arranged:  N/A  Follow-Up:   See me in 1 month    Other Special Instructions:   N/A  Call the [place] office at [phone] if you have any questions, problems or concerns.

## 2015-06-12 NOTE — Progress Notes (Signed)
CARDIOLOGY OFFICE NOTE  Date:  06/12/2015    Kristopher Wilson Date of Birth: [birth year] Medical Record #[id number]  PCP:  Joycelyn Man, MD  Cardiologist:  Aundra Dubin    Chief Complaint  Patient presents with  . Coronary Artery Disease    6 month check - seen for Dr. Aundra Dubin    History of Present Illness: Kristopher Wilson is a 79 y.o. male who presents today for a 6 month check. Seen for Dr. Aundra Dubin. He has a history of CAD, ulcerative colitis, and shingles with post-herpetic neuralgia. Patient had PCI in 2000 with BMS to LAD and repeat PCI in 2009 with DES to PLV. ETT-myoview in 3/16 showed no evidence for ischemia or infarction. His activity has been limited by a left leg neuropathy that has been thought to be due to post-herpetic neuralgia. He has been seen by neurology for this. He wears an orthotic on the left leg because of foot drop.He remains on aspirin and Plavix.    Last seen by Dr. [name] 05/2012.   Saw Richardson Dopp PA in December of 2015 for surgical clearance for L shoulder rotator cuff surgery with Dr. Durward Fortes 09/13/14.He was doing well.  I saw him back in March - he was having atypical chest pain. BP was up. We updated his Myoview - this turned out ok. He was going to monitor his BP at home. Last visit by me was back in April - he was doing well. BP improved. Really likes his salt. His BP cuff is about 10 points higher than here in the office.   Comes back today. Here alone. Doing ok. Had some eye surgery twice over the summer. He has continued to have chest pain - said it stopped for a long time and then started back up just recently - no aggravating factors. He has kept a diary and tells me that he had it on the 29th of August for about 8 to 10 minutes - he was sitting. Had on 8/31 that lasted 6 minutes and he was sitting. Had on 9/5 for about 5 minutes while washing dishes. Had some yesterday for just 3 to 4 minutes while just standing around. No exertional  symptoms. No other associated symptoms. He thinks this may be reminiscent of prior chest pain syndrome but he is not sure. He is walking some and has no problems. He has had some swelling in his ankles - still likes his salt - but now wearing support stockings.   Past Medical History  Diagnosis Date  . CAD (coronary artery disease)   . Hypertension   . Ulcer   . GERD (gastroesophageal reflux disease)   . Hyperthyroidism   . Hyperlipidemia   . RLS (restless legs syndrome)   . CKD (chronic kidney disease)   . CHF (congestive heart failure) ([place])   . VENOUS INSUFFICIENCY 07/28/2010  . PERIPHERAL NEUROPATHY 10/11/2007  . COLONIC POLYPS, ADENOMATOUS, HX OF   . ULCERATIVE COLITIS, LEFT SIDED 2000  . BACK PAIN, LUMBAR 10/18/2009  . URTICARIA, ALLERGIC 08/05/2009  . Benign paroxysmal positional vertigo 09/30/2007  . 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 PROSTATE CANCER, HX OF 07/07/2007    s/p prostatectomy  . Herpes zoster with other nervous system complications(053.19)   . Loss of hearing     Bil/has hearing aids in both ears    Past Surgical History  Procedure Laterality Date  . Tonsillectomy    . Angioplasty  1990's/2004    stent placement 2  .  Inguinal hernia repair    . Prostatectomy    . Colonoscopy w/ biopsies and polypectomy  06/06/2009    adenomatous polyp, diverticulosis, colitis  . Rotator cuff repair Left   . Retinal laser procedure Right      Medications: Current Outpatient Prescriptions  Medication Sig Dispense Refill  . amLODipine (NORVASC) 5 MG tablet Take 1 tablet (5 mg total) by mouth daily. 30 tablet 0  . aspirin 81 MG tablet Take 81 mg by mouth daily.      Marland Kitchen atorvastatin (LIPITOR) 40 MG tablet Take 1 tablet (40 mg total) by mouth daily. 90 tablet 3  . carvedilol (COREG) 6.25 MG tablet TAKE 1 TABLET TWICE A DAY  WITH MEALS 60 tablet 0  . clopidogrel (PLAVIX) 75 MG tablet Take 1 tablet (75 mg total) by mouth daily. 30 tablet 0  . Mesalamine (ASACOL HD) 800 MG  TBEC TAKE 2 TABLETS (1,600MG     TOTAL) 3 TIMES A DAY 150 tablet 0  . nitroGLYCERIN (NITROSTAT) 0.4 MG SL tablet DISSOLVE 1 TABLET UNDER THETONGUE EVERY 5 MINUTES AS  NEEDED 25 tablet 1  . oxyCODONE-acetaminophen (PERCOCET) 10-325 MG per tablet Take 1 tablet by mouth 3 (three) times daily. 200 tablet 0  . pantoprazole (PROTONIX) 40 MG tablet Take 1 tablet (40 mg total) by mouth daily. 30 tablet 0  . pramipexole (MIRAPEX) 1 MG tablet One by mouth each bedtime for RLS 30 tablet 0  . SUPREP BOWEL PREP SOLN USE AS DIRECTED 354 mL 0   No current facility-administered medications for this visit.    Allergies: Allergies  Allergen Reactions  . Cephalexin     REACTION: hives    Social History: The patient  reports that he quit smoking about 39 years ago. His smoking use included Cigarettes. He has never used smokeless tobacco. He reports that he drinks about 3.5 oz of alcohol per week. He reports that he does not use illicit drugs.   Family History: The patient's family history includes Cancer in his brother; Diabetes in his maternal uncle and maternal uncle; Heart attack in his father; Multiple sclerosis in his brother and son; Throat cancer in his brother. There is no history of Colon cancer or Stroke.   Review of Systems: Please see the history of present illness.   Otherwise, the review of systems is positive for leg swelling, diarrhea, snoring, leg pain and chest pressure.   All other systems are reviewed and negative.   Physical Exam: VS:  BP 110/68 mmHg  Pulse 60  Resp 18  Ht 6\' 3"  (1.905 m)  Wt 215 lb (97.523 kg)  BMI 26.87 kg/m2 .  BMI Body mass index is 26.87 kg/(m^2).  Wt Readings from Last 3 Encounters:  06/12/15 215 lb (97.523 kg)  03/28/15 210 lb 4 oz (95.369 kg)  12/11/14 214 lb (97.07 kg)    General: Pleasant. Well developed, well nourished and in no acute distress.  HEENT: Normal. Neck: Supple, no JVD, carotid bruits, or masses noted.  Cardiac: Regular rate and  rhythm. No murmurs, rubs, or gallops. No edema.  Respiratory:  Lungs are clear to auscultation bilaterally with normal work of breathing.  GI: Soft and nontender.  MS: No deformity or atrophy. Gait and ROM intact. Skin: Warm and dry. Color is normal.  Neuro:  Strength and sensation are intact and no gross focal deficits noted.  Psych: Alert, appropriate and with normal affect.   LABORATORY DATA:  EKG:  EKG is ordered today. This shows  Lab Results  Component Value Date   WBC 5.3 09/06/2014   HGB 12.9* 09/06/2014   HCT 39.4 09/06/2014   PLT 202.0 09/06/2014   GLUCOSE 94 09/06/2014   CHOL 186 09/06/2014   TRIG 323.0* 09/06/2014   HDL 42.90 09/06/2014   LDLDIRECT 81.8 09/06/2014   LDLCALC 46 08/25/2012   ALT 24 09/06/2014   AST 24 09/06/2014   NA 137 09/06/2014   K 4.0 09/06/2014   CL 105 09/06/2014   CREATININE 1.0 09/06/2014   BUN 16 09/06/2014   CO2 26 09/06/2014   TSH 2.18 09/06/2014   PSA 0.22 09/06/2014   INR 0.9 02/29/2008   HGBA1C 5.7 10/11/2007    BNP (last 3 results) No results for input(s): BNP in the last 8760 hours.  ProBNP (last 3 results) No results for input(s): PROBNP in the last 8760 hours.   Other Studies Reviewed Today:  Myoview Impression from 11/2014 Exercise Capacity: Fair exercise capacity. BP Response: Hypertensive blood pressure response. Clinical Symptoms: No chest pain. ECG Impression: No significant ST segment change suggestive of ischemia. Comparison with Prior Nuclear Study: Previous scan is normal    Overall Impression: Normal stress nuclear study.  LV Ejection Fraction: 70%. LV Wall Motion: NL LV Function; NL Wall Motion   Dorris Carnes    Assessment/Plan: 1. Chest pain - has known CAD with remote PCI - recent Myoview is normal. Now with recurring symptoms. Explained that cardiac cath would be next option - versus medicines - he would like to try additional medicine for now - will start Imdur 30 mg a day. See back in  one month. EKG today.   2. Coronary Artery Disease: Continue ASA, Plavix, beta blocker, statin.   3. Hypertension: His BP looks good today on his current regimen.   4. Hyperlipidemia: Continue statin. Labs today.   5. Swelling - most likely from excessive salt - encouraged to try and cut back and continue to wear his compression stockings.   Current medicines are reviewed with the patient today.  The patient does not have concerns regarding medicines other than what has been noted above.  The following changes have been made:  See above.  Labs/ tests ordered today include:    Orders Placed This Encounter  Procedures  . Basic metabolic panel  . CBC  . Hepatic function panel  . Lipid panel  . EKG 12-Lead     Disposition:   FU with me in 6 months.   Patient is agreeable to this plan and will call if any problems develop in the interim.   Signed: Burtis Junes, RN, ANP-C 06/12/2015 8:45 AM  [facility] [address] [place], [place]  [address] Phone: [phone] Fax: [phone]

## 2015-07-04 ENCOUNTER — Ambulatory Visit (AMBULATORY_SURGERY_CENTER): Payer: Self-pay | Admitting: *Deleted

## 2015-07-04 VITALS — Ht 75.0 in | Wt 217.0 lb

## 2015-07-04 DIAGNOSIS — K5190 Ulcerative colitis, unspecified, without complications: Secondary | ICD-10-CM

## 2015-07-04 NOTE — Progress Notes (Signed)
No egg or soy allergy. No anesthesia problems.  No home O2.  No diet meds.  Pt already had suprep from when he had to reschedule.

## 2015-07-12 ENCOUNTER — Other Ambulatory Visit: Payer: Self-pay | Admitting: Family Medicine

## 2015-07-12 DIAGNOSIS — G2581 Restless legs syndrome: Secondary | ICD-10-CM

## 2015-07-12 MED ORDER — OXYCODONE-ACETAMINOPHEN 10-325 MG PO TABS: 1.0000 | ORAL_TABLET | Freq: Three times a day (TID) | ORAL | Status: DC

## 2015-07-12 NOTE — Telephone Encounter (Signed)
#  90 only give by Dr Raliegh Ip.  Patient's wife is aware.

## 2015-07-12 NOTE — Telephone Encounter (Signed)
Pt needs new rx oxycodone

## 2015-07-12 NOTE — Telephone Encounter (Signed)
ok 

## 2015-07-18 ENCOUNTER — Ambulatory Visit (AMBULATORY_SURGERY_CENTER): Payer: Federal, State, Local not specified - PPO | Admitting: Internal Medicine

## 2015-07-18 ENCOUNTER — Encounter: Payer: Self-pay | Admitting: Internal Medicine

## 2015-07-18 ENCOUNTER — Encounter: Payer: Federal, State, Local not specified - PPO | Admitting: Internal Medicine

## 2015-07-18 VITALS — BP 139/75 | HR 59 | Temp 96.0°F | Resp 19 | Ht 75.0 in | Wt 217.0 lb

## 2015-07-18 DIAGNOSIS — K51919 Ulcerative colitis, unspecified with unspecified complications: Secondary | ICD-10-CM

## 2015-07-18 DIAGNOSIS — D120 Benign neoplasm of cecum: Secondary | ICD-10-CM | POA: Diagnosis not present

## 2015-07-18 DIAGNOSIS — K635 Polyp of colon: Secondary | ICD-10-CM

## 2015-07-18 MED ORDER — SODIUM CHLORIDE 0.9 % IV SOLN: 500.0000 mL | INTRAVENOUS | Status: DC

## 2015-07-18 NOTE — Op Note (Signed)
[facility]  [address]. [place], [address]   COLONOSCOPY PROCEDURE REPORT  PATIENT: Kristopher Wilson, Kristopher Wilson  MR#: [id number] BIRTHDATE: 07/28/1936 , 79  yrs. old GENDER: male ENDOSCOPIST: Gatha Mayer, MD, [facility] PROCEDURE DATE:  07/18/2015 PROCEDURE:   Colonoscopy, surveillance and Colonoscopy with biopsy First Screening Colonoscopy - Avg.  risk and is 50 yrs.  old or older - No.  Prior Negative Screening - Now for repeat screening. N/A  History of Adenoma - Now for follow-up colonoscopy & has been > or = to 3 yrs.  N/A  Polyps removed today? Yes ASA CLASS:   Class III INDICATIONS:Inflammatory bowel disease of the intestine if more precise diagnosis or determination of the extent / severity of activity of disease will influence immediate / future management and Inflammatory Bowel Disease ( = 8 years pancolitis or = 15 years left sided colitis). MEDICATIONS: Propofol 200 mg IV and Monitored anesthesia care  DESCRIPTION OF PROCEDURE:   After the risks benefits and alternatives of the procedure were thoroughly explained, informed consent was obtained.  The digital rectal exam revealed no rectal mass.   The LB [id number]  endoscope was introduced through the anus and advanced to the terminal ileum which was intubated for a short distance. No adverse events experienced.   The quality of the prep was excellent.  (MiraLax was used)  The instrument was then slowly withdrawn as the colon was fully examined. Estimated blood loss is zero unless otherwise noted in this procedure report.  COLON FINDINGS: A polypoid shaped sessile polyp measuring 5 mm in size was found at the ileocecal valve.  A polypectomy was performed with a cold snare.  The resection was complete, the polyp tissue was completely retrieved and sent to histology.   There was moderate diverticulosis noted.   The examination was otherwise normal. Biopise taken from right (random)and left colon (every  5-10 cm). Retroflexed views revealed no abnormalities. The time to cecum = 2.3 Withdrawal time = 10.4   The scope was withdrawn and the procedure completed. COMPLICATIONS: There were no immediate complications.  ENDOSCOPIC IMPRESSION: 1.   Sessile polyp was found at the ileocecal valve; polypectomy was performed with a cold snare 2.   Moderate diverticulosis was noted 3.   The examination was otherwise normal - left UC in endoscopic remission  RECOMMENDATIONS: 1.  Await pathology results 2.  Resume all meds including clopidogrel 3.  Imodium AD prn in addition to mesalamine chronically  eSigned:  Gatha Mayer, MD, [facility] 07/18/2015 9:02 AM   cc: The Patient

## 2015-07-18 NOTE — Patient Instructions (Addendum)
Things look good - I did remove one polyp and took routine biopsies.  Try Imodium AD when you get spells of diarrhea and let me know if things worsen, etc.   I appreciate the opportunity to care for you.  Gatha Mayer, MD, FACG  YOU HAD AN ENDOSCOPIC PROCEDURE TODAY AT [facility]:   Refer to the procedure report that was given to you for any specific questions about what was found during the examination.  If the procedure report does not answer your questions, please call your gastroenterologist to clarify.  If you requested that your care partner not be given the details of your procedure findings, then the procedure report has been included in a sealed envelope for you to review at your convenience later.  YOU SHOULD EXPECT: Some feelings of bloating in the abdomen. Passage of more gas than usual.  Walking can help get rid of the air that was put into your GI tract during the procedure and reduce the bloating. If you had a lower endoscopy (such as a colonoscopy or flexible sigmoidoscopy) you may notice spotting of blood in your stool or on the toilet paper. If you underwent a bowel prep for your procedure, you may not have a normal bowel movement for a few days.  Please Note:  You might notice some irritation and congestion in your nose or some drainage.  This is from the oxygen used during your procedure.  There is no need for concern and it should clear up in a day or so.  SYMPTOMS TO REPORT IMMEDIATELY:   Following lower endoscopy (colonoscopy or flexible sigmoidoscopy):  Excessive amounts of blood in the stool  Significant tenderness or worsening of abdominal pains  Swelling of the abdomen that is new, acute  Fever of 100F or higher   Following upper endoscopy (EGD)  Vomiting of blood or coffee ground material  New chest pain or pain under the shoulder blades  Painful or persistently difficult swallowing  New shortness of breath  Fever of 100F or  higher  Black, tarry-looking stools  For urgent or emergent issues, a gastroenterologist can be reached at any hour by calling [phone].   DIET: Your first meal following the procedure should be a small meal and then it is ok to progress to your normal diet. Heavy or fried foods are harder to digest and may make you feel nauseous or bloated.  Likewise, meals heavy in dairy and vegetables can increase bloating.  Drink plenty of fluids but you should avoid alcoholic beverages for 24 hours.  ACTIVITY:  You should plan to take it easy for the rest of today and you should NOT DRIVE or use heavy machinery until tomorrow (because of the sedation medicines used during the test).    FOLLOW UP: Our staff will call the number listed on your records the next business day following your procedure to check on you and address any questions or concerns that you may have regarding the information given to you following your procedure. If we do not reach you, we will leave a message.  However, if you are feeling well and you are not experiencing any problems, there is no need to return our call.  We will assume that you have returned to your regular daily activities without incident.  If any biopsies were taken you will be contacted by phone or by letter within the next 1-3 weeks.  Please call us at [phone] if you  have not heard about the biopsies in 3 weeks.    SIGNATURES/CONFIDENTIALITY: You and/or your care partner have signed paperwork which will be entered into your electronic medical record.  These signatures attest to the fact that that the information above on your After Visit Summary has been reviewed and is understood.  Full responsibility of the confidentiality of this discharge information lies with you and/or your care-partner.  Polyp information given.  Resume Plavix today.

## 2015-07-18 NOTE — Progress Notes (Signed)
Called to room to assist during endoscopic procedure.  Patient ID and intended procedure confirmed with present staff. Received instructions for my participation in the procedure from the performing physician.  

## 2015-07-18 NOTE — Progress Notes (Signed)
Report to PACU, RN, vss, BBS= Clear.  

## 2015-07-19 ENCOUNTER — Telehealth: Payer: Self-pay | Admitting: *Deleted

## 2015-07-19 ENCOUNTER — Telehealth: Payer: Self-pay

## 2015-07-19 NOTE — Telephone Encounter (Signed)
  Follow up Call-  Call back number 07/18/2015  Post procedure Call Back phone  # [phone]  Permission to leave phone message Yes     Patient questions:  Do you have a fever, pain , or abdominal swelling? No. Pain Score  0 *  Have you tolerated food without any problems? Yes.    Have you been able to return to your normal activities? Yes.    Do you have any questions about your discharge instructions: Diet   No. Medications  No. Follow up visit  No.  Do you have questions or concerns about your Care? No.  Actions: * If pain score is 4 or above: No action needed, pain <4.

## 2015-07-19 NOTE — Telephone Encounter (Signed)
Called in error.    .

## 2015-07-22 ENCOUNTER — Ambulatory Visit (INDEPENDENT_AMBULATORY_CARE_PROVIDER_SITE_OTHER): Payer: Federal, State, Local not specified - PPO | Admitting: Family Medicine

## 2015-07-22 ENCOUNTER — Encounter: Payer: Self-pay | Admitting: Family Medicine

## 2015-07-22 VITALS — BP 110/80 | Temp 97.7°F | Wt 214.0 lb

## 2015-07-22 DIAGNOSIS — Z23 Encounter for immunization: Secondary | ICD-10-CM

## 2015-07-22 DIAGNOSIS — G2581 Restless legs syndrome: Secondary | ICD-10-CM

## 2015-07-22 DIAGNOSIS — M542 Cervicalgia: Secondary | ICD-10-CM | POA: Diagnosis not present

## 2015-07-22 DIAGNOSIS — G894 Chronic pain syndrome: Secondary | ICD-10-CM

## 2015-07-22 MED ORDER — OXYCODONE-ACETAMINOPHEN 10-325 MG PO TABS: 1.0000 | ORAL_TABLET | Freq: Three times a day (TID) | ORAL | Status: DC

## 2015-07-22 NOTE — Patient Instructions (Addendum)
We will get you set up a consult in physical therapy for evaluation treatment of the soreness in your neck    We will also get you set up a consult in the pain clinic to evaluate your pain to see if anything else can be done  Set up a time in January or February to establish with [facility] for your physical examination in long-term care

## 2015-07-22 NOTE — Progress Notes (Signed)
   Subjective:    Patient ID: Kristopher Wilson, male    DOB: [birth year], 79 y.o.   MRN: [id number]  HPI Kristopher Wilson is a 79 year old married male nonsmoker who comes in today for evaluation neck pain  He had a tear in his left retina in June. He had laser surgery and had to stay with his neck to the left in the supine position for about 5 weeks until that healed. Since that time he's had persistent pain in the right side of his neck. He denies any neurologic symptoms   Review of Systems    review of systems otherwise negative Objective:   Physical Exam  Well-developed well-nourished male no acute distress vital signs stable he is afebrile examination neck shows full range of motion except for tenderness in the right sternocleidomastoid muscle.  Neurologic exam normal      Assessment & Plan:  Cervical muscle spasm persistent.......... PT consult  Chronic pain secondary to shingles........ discussed treatment options. Patient willing to go to the pain clinic to see if there is anything new they might have to offer in lieu of taken his pain pills 3 times a day  Also needs to get set up for long-term care and physical exam with Kristopher Wilson in January February

## 2015-07-22 NOTE — Progress Notes (Signed)
Pre visit review using our clinic review tool, if applicable. No additional management support is needed unless otherwise documented below in the visit note. 

## 2015-07-23 ENCOUNTER — Ambulatory Visit: Payer: Federal, State, Local not specified - PPO | Attending: Family Medicine | Admitting: Physical Therapy

## 2015-07-23 ENCOUNTER — Encounter: Payer: Self-pay | Admitting: Nurse Practitioner

## 2015-07-23 ENCOUNTER — Encounter: Payer: Self-pay | Admitting: Physical Therapy

## 2015-07-23 ENCOUNTER — Ambulatory Visit (INDEPENDENT_AMBULATORY_CARE_PROVIDER_SITE_OTHER): Payer: Federal, State, Local not specified - PPO | Admitting: Nurse Practitioner

## 2015-07-23 VITALS — BP 160/90 | HR 64 | Ht 75.0 in | Wt 208.8 lb

## 2015-07-23 DIAGNOSIS — E785 Hyperlipidemia, unspecified: Secondary | ICD-10-CM | POA: Diagnosis not present

## 2015-07-23 DIAGNOSIS — M542 Cervicalgia: Secondary | ICD-10-CM

## 2015-07-23 DIAGNOSIS — I2510 Atherosclerotic heart disease of native coronary artery without angina pectoris: Secondary | ICD-10-CM | POA: Diagnosis not present

## 2015-07-23 DIAGNOSIS — M436 Torticollis: Secondary | ICD-10-CM | POA: Insufficient documentation

## 2015-07-23 DIAGNOSIS — I10 Essential (primary) hypertension: Secondary | ICD-10-CM | POA: Diagnosis not present

## 2015-07-23 NOTE — Progress Notes (Signed)
CARDIOLOGY OFFICE NOTE  Date:  07/23/2015    Kristopher Wilson Date of Birth: [birth year] Medical Record [id number]  PCP:  Joycelyn Man, MD  Cardiologist:  Aundra Dubin    Chief Complaint  Patient presents with  . Coronary Artery Disease    6 week check - seen for Dr. Aundra Dubin    History of Present Illness: Kristopher Wilson is a 79 y.o. male who presents today for a 6 week check. Seen for Dr. Aundra Dubin. He has a history of CAD, ulcerative colitis, and shingles with post-herpetic neuralgia. Patient had PCI in 2000 with BMS to LAD and repeat PCI in 2009 with DES to PLV. ETT-myoview in 3/16 showed no evidence for ischemia or infarction. His activity has been limited by a left leg neuropathy that has been thought to be due to post-herpetic neuralgia. He has been seen by neurology for this. He wears an orthotic on the left leg because of foot drop.He remains on aspirin and Plavix.   Last seen by Dr. [name] 05/2012.   Saw Richardson Dopp PA in December of 2015 for surgical clearance for L shoulder rotator cuff surgery with Dr. Durward Fortes 09/13/14.He was doing well.  I saw him back in March of 2016 - he was having atypical chest pain. BP was up. We updated his Myoview - this turned out ok. He was going to monitor his BP at home. When seen back in April - he was doing well. BP improved. Really likes his salt. His BP cuff was about 10 points higher than here in the office.   Seen 6 weeks ago - having chest pain. Pretty hard to sort out. Still getting too much salt. Due to his recent Myoview - I placed him on Imdur. He wanted to try and be managed medically.   Comes back today. Here alone. Has not had his medicines today - he was rushing. He has had a retinal tear that has been repaired. Said his neck has a crick in it due to the positioning of his head with that procedure. His chest pain has improved considerably. Only one "slight" spell - lasted about 2 minutes. He is happy with how he is  doing and tolerating his regimen well.   Past Medical History  Diagnosis Date  . CAD (coronary artery disease)   . Hypertension   . Ulcer   . GERD (gastroesophageal reflux disease)   . Hyperthyroidism   . Hyperlipidemia   . RLS (restless legs syndrome)   . CKD (chronic kidney disease)   . CHF (congestive heart failure) ([place])   . VENOUS INSUFFICIENCY 07/28/2010  . PERIPHERAL NEUROPATHY 10/11/2007  . COLONIC POLYPS, ADENOMATOUS, HX OF   . ULCERATIVE COLITIS, LEFT SIDED 2000  . BACK PAIN, LUMBAR 10/18/2009  . URTICARIA, ALLERGIC 08/05/2009  . Benign paroxysmal positional vertigo 09/30/2007  . 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 PROSTATE CANCER, HX OF 07/07/2007    s/p prostatectomy  . Herpes zoster with other nervous system complications(053.19)   . Loss of hearing     Bil/has hearing aids in both ears    Past Surgical History  Procedure Laterality Date  . Tonsillectomy    . Angioplasty  1990's/2004    stent placement 2  . Inguinal hernia repair    . Prostatectomy    . Colonoscopy w/ biopsies and polypectomy  06/06/2009    adenomatous polyp, diverticulosis, colitis  . Rotator cuff repair Left   . Retinal laser procedure Right  Medications: Current Outpatient Prescriptions  Medication Sig Dispense Refill  . amLODipine (NORVASC) 5 MG tablet Take 1 tablet (5 mg total) by mouth daily. 30 tablet 0  . aspirin 81 MG tablet Take 81 mg by mouth daily.      Marland Kitchen atorvastatin (LIPITOR) 40 MG tablet Take 1 tablet (40 mg total) by mouth daily. 90 tablet 3  . carvedilol (COREG) 6.25 MG tablet TAKE 1 TABLET TWICE A DAY  WITH MEALS 60 tablet 0  . clopidogrel (PLAVIX) 75 MG tablet Take 1 tablet (75 mg total) by mouth daily. 30 tablet 0  . isosorbide mononitrate (IMDUR) 30 MG 24 hr tablet Take 1 tablet (30 mg total) by mouth daily. 30 tablet 6  . Mesalamine (ASACOL HD) 800 MG TBEC TAKE 2 TABLETS (1,600MG     TOTAL) 3 TIMES A DAY 150 tablet 0  . nitroGLYCERIN (NITROSTAT) 0.4 MG SL tablet  DISSOLVE 1 TABLET UNDER THETONGUE EVERY 5 MINUTES AS  NEEDED 25 tablet 1  . oxyCODONE-acetaminophen (PERCOCET) 10-325 MG tablet Take 1 tablet by mouth 3 (three) times daily. 200 tablet 0  . pantoprazole (PROTONIX) 40 MG tablet Take 1 tablet (40 mg total) by mouth daily. 30 tablet 0  . pramipexole (MIRAPEX) 1 MG tablet One by mouth each bedtime for RLS 30 tablet 0   No current facility-administered medications for this visit.    Allergies: Allergies  Allergen Reactions  . Cephalexin     REACTION: hives    Social History: The patient  reports that he quit smoking about 39 years ago. His smoking use included Cigarettes. He has never used smokeless tobacco. He reports that he drinks about 4.2 oz of alcohol per week. He reports that he does not use illicit drugs.   Family History: The patient's family history includes Cancer in his brother; Diabetes in his maternal uncle and maternal uncle; Heart attack in his father; Multiple sclerosis in his brother and son; Throat cancer in his brother. There is no history of Colon cancer or Stroke.   Review of Systems: Please see the history of present illness.   Otherwise, the review of systems is positive for none.   All other systems are reviewed and negative.   Physical Exam: VS:  BP 160/90 mmHg  Pulse 64  Ht 6\' 3"  (1.905 m)  Wt 208 lb 12.8 oz (94.711 kg)  BMI 26.10 kg/m2  SpO2 95% .  BMI Body mass index is 26.1 kg/(m^2).  Wt Readings from Last 3 Encounters:  07/23/15 208 lb 12.8 oz (94.711 kg)  07/22/15 214 lb (97.07 kg)  07/18/15 217 lb (98.431 kg)   BP is 140/80 by me.   General: Pleasant. Well developed, well nourished and in no acute distress.  HEENT: Normal. Neck: Supple, no JVD, carotid bruits, or masses noted.  Cardiac: Regular rate and rhythm. No murmurs, rubs, or gallops. No edema. He has his support stockings in place.   Respiratory:  Lungs are clear to auscultation bilaterally with normal work of breathing.  GI: Soft and  nontender.  MS: No deformity or atrophy. Gait and ROM intact. Skin: Warm and dry. Color is normal.  Neuro:  Strength and sensation are intact and no gross focal deficits noted.  Psych: Alert, appropriate and with normal affect.   LABORATORY DATA:  EKG:  EKG is not ordered today.  Lab Results  Component Value Date   WBC 5.2 06/12/2015   HGB 13.0 06/12/2015   HCT 38.3* 06/12/2015   PLT 224 06/12/2015  GLUCOSE 106* 06/12/2015   CHOL 195 06/12/2015   TRIG 378* 06/12/2015   HDL 43 06/12/2015   LDLDIRECT 81.8 09/06/2014   LDLCALC 76 06/12/2015   ALT 32 06/12/2015   AST 20 06/12/2015   NA 138 06/12/2015   K 4.1 06/12/2015   CL 104 06/12/2015   CREATININE 1.17 06/12/2015   BUN 19 06/12/2015   CO2 24 06/12/2015   TSH 2.18 09/06/2014   PSA 0.22 09/06/2014   INR 0.9 02/29/2008   HGBA1C 5.7 10/11/2007    BNP (last 3 results) No results for input(s): BNP in the last 8760 hours.  ProBNP (last 3 results) No results for input(s): PROBNP in the last 8760 hours.   Other Studies Reviewed Today:  Myoview Impression from 11/2014 Exercise Capacity: Fair exercise capacity. BP Response: Hypertensive blood pressure response. Clinical Symptoms: No chest pain. ECG Impression: No significant ST segment change suggestive of ischemia. Comparison with Prior Nuclear Study: Previous scan is normal    Overall Impression: Normal stress nuclear study.  LV Ejection Fraction: 70%. LV Wall Motion: NL LV Function; NL Wall Motion   Dorris Carnes    Assessment/Plan: 1. Chest pain - has known CAD with remote PCI - recent Myoview was normal. Started on Imdur - he has wanted to manage medically - he is better clinically - I have left him on his current regimen. See back in 4 months.   2. Coronary Artery Disease: Continue ASA, Plavix, beta blocker, statin.   3. Hypertension: No medicines today but repeat BP is lower - he has good control at home.   4. Hyperlipidemia: Continue  statin. Labs last month - triglycerides up but he admits to liking his sweets but has cut back. Recheck lab on return.  5. Swelling - most likely from excessive salt - encouraged to try and cut back and continue to wear his compression stockings.   Current medicines are reviewed with the patient today.  The patient does not have concerns regarding medicines other than what has been noted above.  The following changes have been made:  See above.  Labs/ tests ordered today include:   No orders of the defined types were placed in this encounter.     Disposition:   FU with me in 4 months.    Patient is agreeable to this plan and will call if any problems develop in the interim.   Signed: Burtis Junes, RN, ANP-C 07/23/2015 8:08 AM  [facility] [address] [place], [place]  [address] Phone: [phone] Fax: [phone]

## 2015-07-23 NOTE — Patient Instructions (Addendum)
We will be checking the following labs today - NONE   Medication Instructions:    Continue with your current medicines.     Testing/Procedures To Be Arranged:  N/A  Follow-Up:   See me in 4 months with fasting labs    Other Special Instructions:   N/A    If you need a refill on your cardiac medications before your next appointment, please call your pharmacy.   Call the [facility] office at [phone] if you have any questions, problems or concerns.      

## 2015-07-23 NOTE — Patient Instructions (Signed)
AROM: Neck Rotation    Turn head slowly to look over one shoulder, then the other. Hold each position _5___ seconds. Repeat __2__ times per set. Do _1___ sets per session. Do _3___ sessions per day.  http://orth.exer.us/294   Copyright  VHI. All rights reserved.  Flexibility: Upper Trapezius Stretch    Gently grasp right side of head while reaching behind back with other hand. Tilt head away until a gentle stretch is felt. Hold _5___ seconds. Repeat __2__ times per set. Do __1__ sets per session. Do __3 session  Axial Extension (Chin Tuck)    Pull chin in and lengthen back of neck. Hold _5___ seconds while counting out loud. Repeat ___3_ times. Do __3__ sessions per day.  http://gt2.exer.us/449   Copyright  VHI. All rights reserved.  [facility] [address] [place], [place] [address] Phone # [phone] Fax [phone]

## 2015-07-23 NOTE — Therapy (Signed)
[facility] [address] [place], [place], [address] Phone: [phone]   Fax:  [phone]  Physical Therapy Evaluation  Patient Details  Name: Kristopher Wilson MRN: [id number] Date of Birth: [birth year] Referring Provider: Dr. Stevie Kern  Encounter Date: 07/23/2015      PT End of Session - 07/23/15 1642    Visit Number 1   Number of Visits 10  Medicare   Date for PT Re-Evaluation 09/03/15   PT Start Time [id number]   PT Stop Time 1643   PT Time Calculation (min) 28 min   Activity Tolerance Patient tolerated treatment well   Behavior During Therapy [facility] for tasks assessed/performed      Past Medical History  Diagnosis Date  . CAD (coronary artery disease)   . Hypertension   . Ulcer   . GERD (gastroesophageal reflux disease)   . Hyperthyroidism   . Hyperlipidemia   . RLS (restless legs syndrome)   . CKD (chronic kidney disease)   . CHF (congestive heart failure) ([place])   . VENOUS INSUFFICIENCY 07/28/2010  . PERIPHERAL NEUROPATHY 10/11/2007  . COLONIC POLYPS, ADENOMATOUS, HX OF   . ULCERATIVE COLITIS, LEFT SIDED 2000  . BACK PAIN, LUMBAR 10/18/2009  . URTICARIA, ALLERGIC 08/05/2009  . Benign paroxysmal positional vertigo 09/30/2007  . 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 PROSTATE CANCER, HX OF 07/07/2007    s/p prostatectomy  . Herpes zoster with other nervous system complications(053.19)   . Loss of hearing     Bil/has hearing aids in both ears    Past Surgical History  Procedure Laterality Date  . Tonsillectomy    . Angioplasty  1990's/2004    stent placement 2  . Inguinal hernia repair    . Prostatectomy    . Colonoscopy w/ biopsies and polypectomy  06/06/2009    adenomatous polyp, diverticulosis, colitis  . Rotator cuff repair Left   . Retinal laser procedure Right     There were no vitals filed for this visit.  Visit Diagnosis:  Cervical pain - Plan: PT plan of care cert/re-cert  Stiffness of  cervical spine - Plan: PT plan of care cert/re-cert      Subjective Assessment - 07/23/15 1616    Subjective Patient reports his neck has been sore and stiff for 5 weeks.  Patient has had 2 retna surgeries and had to sleep awkwardly.     Patient Stated Goals stop the pain   Currently in Pain? Yes   Pain Score 5    Pain Location Neck   Pain Orientation Right   Pain Descriptors / Indicators Aching   Pain Type Acute pain   Pain Onset 1 to 4 weeks ago   Pain Frequency Constant   Aggravating Factors  turning head to drive, sleeping right side   Pain Relieving Factors heat   Multiple Pain Sites No            [facility] PT Assessment - 07/23/15 0001    Assessment   Medical Diagnosis M54.2 Neck pain on right side   Referring Provider Dr. Stevie Kern   Onset Date/Surgical Date 06/08/15   Hand Dominance Right   Prior Therapy None   Precautions   Precautions Other (comment)   Precaution Comments Cancer precautions   Balance Screen   Has the patient fallen in the past 6 months No   Has the patient had a decrease in activity level because of a fear of falling?  No   Is the  patient reluctant to leave their home because of a fear of falling?  No   Prior Function   Level of Independence Independent   Cognition   Overall Cognitive Status Within Functional Limits for tasks assessed   Observation/Other Assessments   Focus on Therapeutic Outcomes (FOTO)  42% limitation CK  goal is 33% limitation CJ   Posture/Postural Control   Posture/Postural Control Postural limitations   Postural Limitations Forward head;Rounded Shoulders  tilt head to left   ROM / Strength   AROM / PROM / Strength AROM;Strength   AROM   Cervical Flexion full    Cervical Extension full   Cervical - Right Side Bend decreased by 25% with pain   Cervical - Left Side Bend full with pain   Cervical - Right Rotation full with endrange pain   Cervical - Left Rotation decreased by 25% with pain at endrange   Thoracic -  Right Rotation decreased by 25% with pain   Palpation   Spinal mobility decreased mobiity of C7-T4, sideglide of C3   Palpation comment tenderness located on right cervical musculature                           PT Education - 07/23/15 1642    Education provided Yes   Education Details cervical ROM and retraction   Person(s) Educated Patient   Methods Explanation;Demonstration;Verbal cues;Handout   Comprehension Returned demonstration;Verbalized understanding          PT Short Term Goals - 07/23/15 1650    PT SHORT TERM GOAL #1   Title Independent with Cervical ROM exercises   Time 3   Period Weeks   Status New   PT SHORT TERM GOAL #2   Title cervical pain with turning his head decreased by 25%   Time 3   Period Weeks   Status New   PT SHORT TERM GOAL #3   Title cervical pain with sleeping on sided decreased by >/= 25%   Time 3   Period Weeks   Status New           PT Long Term Goals - 07/23/15 1650    PT LONG TERM GOAL #1   Title Indpendent with HEP and how to progress himself   Time 6   Period Weeks   Status New   PT LONG TERM GOAL #2   Title cervical pain decreased >/= 75% due to full cervical rotation   Time 3   Period Weeks   Status New   PT LONG TERM GOAL #3   Title sleeping on his side with cervical pain decreaased >/= 75% due to full cervical sidebending   Time 6   Period Weeks   Status New   PT LONG TERM GOAL #4   Title FOTO score </= 33% limitation   Time 6   Period Weeks   Status New               Plan - 07/23/15 1644    Clinical Impression Statement Patient is a 79 year old male with diagnosis of neck pain for the past 5 weeks with sudden onset.  Patient feels it could be the way he had to position his head after retinal surgery.  Patient reports intermittent right cervical pain at level  5/10 with turning his head and laying on the right side.  FOTO score is 33% limitation. Cervcial ROM deficits: right sidebending  decreased by 25% and left  rotation decreased by 25%.  Right  thoracic rotation decreased by 25%.  Tenderness located on right cervical paraspinals and decreased mobility of C7-T4 and decreased mobility of C3. Patient would benefit from physical therapy to improve cervical ROM and decresae pain.    Pt will benefit from skilled therapeutic intervention in order to improve on the following deficits Decreased range of motion;Pain;Decreased mobility;Increased muscle spasms   Rehab Potential Excellent   Clinical Impairments Affecting Rehab Potential None   PT Frequency 1x / week   PT Duration 6 weeks   PT Treatment/Interventions Cryotherapy;Electrical Stimulation;Therapeutic exercise;Therapeutic activities;Neuromuscular re-education;Patient/family education;Manual techniques;Passive range of motion   PT Next Visit Plan soft tissue work to cervical  on the right, joint mobilization to cervical and upper thoracic, postural awarness   PT Home Exercise Plan postural awareness   Recommended Other Services None   Consulted and Agree with Plan of Care Patient          G-Codes - 08/04/2015 1653    Functional Assessment Tool Used FOTO score is 42% limitation CK  goal is 335 limitation CJ   Functional Limitation Changing and maintaining body position   Changing and Maintaining Body Position Current Status [id number]) At least 40 percent but less than 60 percent impaired, limited or restricted   Changing and Maintaining Body Position Goal Status [id number]) At least 20 percent but less than 40 percent impaired, limited or restricted       Problem List Patient Active Problem List   Diagnosis Date Noted  . Neck pain on right side 07/22/2015  . Long term current use of antithrombotics/antiplatelets - clopidogrel and ASA 03/28/2015  . Infected sebaceous cyst 11/20/2014  . Left shoulder pain 05/31/2014  . Restless leg syndrome 11/14/2013  . Night sweats 09/12/2013  . Hyperlipidemia 12/18/2010  . VENOUS  INSUFFICIENCY 07/28/2010  . 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 ULCERATIVE COLITIS, LEFT SIDED 12/27/2009  . COLONIC POLYPS, ADENOMATOUS, HX OF 12/27/2009  . BACK PAIN, LUMBAR 10/18/2009  . RESTLESS LEG SYNDROME 05/31/2009  . GERD 04/18/2008  . BENIGN PAROXYSMAL POSITIONAL VERTIGO 09/30/2007  . Essential hypertension 07/07/2007  . CORONARY ARTERY DISEASE 07/07/2007  . PROSTATE CANCER, HX OF 07/07/2007    Zong Mcquarrie,PT August 04, 2015, 4:55 PM  [facility] [address] [place], [place], [address] Phone: [phone]   Fax:  [phone]  Name: JANTHONY BEHAR MRN: [id number] Date of Birth: 1936/01/04

## 2015-07-30 ENCOUNTER — Ambulatory Visit: Payer: Federal, State, Local not specified - PPO

## 2015-07-30 DIAGNOSIS — M436 Torticollis: Secondary | ICD-10-CM

## 2015-07-30 DIAGNOSIS — M542 Cervicalgia: Secondary | ICD-10-CM | POA: Diagnosis not present

## 2015-07-30 NOTE — Patient Instructions (Signed)
Posture - Standing   Good posture is important. Avoid slouching and forward head thrust. Maintain curve in low back and align ears over shoulders, hips over ankles.  Pull your belly button in toward your back bone. Posture Tips DO: - stand tall and erect - keep chin tucked in - keep head and shoulders in alignment - check posture regularly in mirror or large window - pull head back against headrest in car seat;  Change your position often.  Sit with lumbar support. DON'T: - slouch or slump while watching TV or reading - sit, stand or lie in one position  for too long;  Sitting is especially hard on the spine so if you sit at a desk/use the computer, then stand up often! Copyright  VHI. All rights reserved.  Posture - Sitting  Sit upright, head facing forward. Try using a roll to support lower back. Keep shoulders relaxed, and avoid rounded back. Keep hips level with knees. Avoid crossing legs for long periods. Copyright  VHI. All rights reserved.  Chronic neck strain can develop because of poor posture and faulty work habits  Postural strain related to slumped sitting and forward head posture is a leading cause of headaches, neck and upper back pain  General strengthening and flexibility exercises are helpful in the treatment of neck pain.  Most importantly, you should learn to correct the posture that may be contributing to chronic pain.   Change positions frequently  Change your work or home environment to improve posture and mechanics.       Resisted Horizontal Abduction: Bilateral   Sit or stand, tubing in both hands, arms out in front. Keeping arms straight, pinch shoulder blades together and stretch arms out. Repeat _10___ times per set. Do 2____ sets per session. Do _1-2___ sessions per day.    Scapular Retraction: Elbow Flexion (Standing)   With elbows bent to 90, pinch shoulder blades together and rotate arms out, keeping elbows bent. Repeat _10___ times per set.  Do _1___ sets per session. Do many____ sessions per day.   [facility] [address] [place], [place] [address] Phone # [phone] Fax [phone]

## 2015-07-30 NOTE — Therapy (Signed)
[facility] [address] [place], [place], [address] Phone: [phone]   Fax:  [phone]  Physical Therapy Treatment  Patient Details  Name: Kristopher Wilson MRN: [id number] Date of Birth: 10-18-1935 Referring Provider: Dr. Stevie Kern  Encounter Date: 07/30/2015      PT End of Session - 07/30/15 0804    Visit Number 2   Number of Visits 10   Date for PT Re-Evaluation 09/03/15   PT Start Time 0733   PT Stop Time 0804   PT Time Calculation (min) 31 min   Activity Tolerance Patient tolerated treatment well   Behavior During Therapy [facility] for tasks assessed/performed      Past Medical History  Diagnosis Date  . CAD (coronary artery disease)   . Hypertension   . Ulcer   . GERD (gastroesophageal reflux disease)   . Hyperthyroidism   . Hyperlipidemia   . RLS (restless legs syndrome)   . CKD (chronic kidney disease)   . CHF (congestive heart failure) ([place])   . VENOUS INSUFFICIENCY 07/28/2010  . PERIPHERAL NEUROPATHY 10/11/2007  . COLONIC POLYPS, ADENOMATOUS, HX OF   . ULCERATIVE COLITIS, LEFT SIDED 2000  . BACK PAIN, LUMBAR 10/18/2009  . URTICARIA, ALLERGIC 08/05/2009  . Benign paroxysmal positional vertigo 09/30/2007  . 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 PROSTATE CANCER, HX OF 07/07/2007    s/p prostatectomy  . Herpes zoster with other nervous system complications(053.19)   . Loss of hearing     Bil/has hearing aids in both ears    Past Surgical History  Procedure Laterality Date  . Tonsillectomy    . Angioplasty  1990's/2004    stent placement 2  . Inguinal hernia repair    . Prostatectomy    . Colonoscopy w/ biopsies and polypectomy  06/06/2009    adenomatous polyp, diverticulosis, colitis  . Rotator cuff repair Left   . Retinal laser procedure Right     There were no vitals filed for this visit.  Visit Diagnosis:  Cervical pain  Stiffness of cervical spine      Subjective Assessment -  07/30/15 0736    Subjective Pt reports that his neck is feeling a little better.  Stretches have been helping.  Pain was bad on Saturday and had a bad headache.  3/10 neck pain only now.     Currently in Pain? Yes   Pain Score 3    Pain Location Neck   Pain Orientation Right   Pain Descriptors / Indicators Aching   Pain Type Acute pain   Pain Onset 1 to 4 weeks ago   Pain Frequency Constant   Aggravating Factors  turning head to drive, sleeping on Rt side   Pain Relieving Factors heat                         [facility] Adult PT Treatment/Exercise - 07/30/15 0001    Exercises   Exercises Neck;Shoulder   Neck Exercises: Machines for Strengthening   UBE (Upper Arm Bike) Level 1x 6 minutes (3/3)  verbal cues for posture   Neck Exercises: Seated   Postural Training scapular squeezes 2x10    Other Seated Exercise Cervcial AROM in all directions 3x5  seconds each   Shoulder Exercises: Supine   Horizontal ABduction Strengthening;Both;20 reps;Theraband   Theraband Level (Shoulder Horizontal ABduction) Level 2 (Red)   Manual Therapy   Manual Therapy Myofascial release;Soft tissue mobilization   Manual therapy comments soft  tissue elongation and trigger point release to bilateral suboccipitals and Rt cervical paraspinals                PT Education - 07/30/15 0744    Education provided Yes   Education Details posture education, scap squeezes and horizontal abduction   Person(s) Educated Patient   Methods Explanation;Demonstration;Handout   Comprehension Returned demonstration;Verbalized understanding          PT Short Term Goals - 07/30/15 0737    PT SHORT TERM GOAL #1   Title Independent with Cervical ROM exercises   Time 3   Period Weeks   Status On-going  still learning   PT SHORT TERM GOAL #2   Title cervical pain with turning his head decreased by 25%   Period Weeks   Status On-going   PT SHORT TERM GOAL #3   Title cervical pain with sleeping on  sided decreased by >/= 25%   Time 3   Period Weeks   Status On-going           PT Long Term Goals - 07/23/15 1650    PT LONG TERM GOAL #1   Title Indpendent with HEP and how to progress himself   Time 6   Period Weeks   Status New   PT LONG TERM GOAL #2   Title cervical pain decreased >/= 75% due to full cervical rotation   Time 3   Period Weeks   Status New   PT LONG TERM GOAL #3   Title sleeping on his side with cervical pain decreaased >/= 75% due to full cervical sidebending   Time 6   Period Weeks   Status New   PT LONG TERM GOAL #4   Title FOTO score </= 33% limitation   Time 6   Period Weeks   Status New               Plan - 07/30/15 [id number]    Clinical Impression Statement Pt with only 1 session after evaluation.  Pt reports that neck is less painful with performing stretching exercises issued last session . Pt with headache over the weekeend.  Pt received postural education today and is performing all HEP correctly.  Pt with continued Rt sided necek pain and muscle tension.  Pt will benefit from skilled PT for manual therapy, flexibility and postural strength progression.     Pt will benefit from skilled therapeutic intervention in order to improve on the following deficits Decreased range of motion;Pain;Decreased mobility;Increased muscle spasms   Rehab Potential Excellent   PT Frequency 1x / week   PT Duration 6 weeks   PT Treatment/Interventions Cryotherapy;Electrical Stimulation;Therapeutic exercise;Therapeutic activities;Neuromuscular re-education;Patient/family education;Manual techniques;Passive range of motion   Consulted and Agree with Plan of Care Patient        Problem List Patient Active Problem List   Diagnosis Date Noted  . Neck pain on right side 07/22/2015  . Long term current use of antithrombotics/antiplatelets - clopidogrel and ASA 03/28/2015  . Infected sebaceous cyst 11/20/2014  . Left shoulder pain 05/31/2014  . Restless leg  syndrome 11/14/2013  . Night sweats 09/12/2013  . Hyperlipidemia 12/18/2010  . VENOUS INSUFFICIENCY 07/28/2010  . 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 ULCERATIVE COLITIS, LEFT SIDED 12/27/2009  . COLONIC POLYPS, ADENOMATOUS, HX OF 12/27/2009  . BACK PAIN, LUMBAR 10/18/2009  . RESTLESS LEG SYNDROME 05/31/2009  . GERD 04/18/2008  . BENIGN PAROXYSMAL POSITIONAL VERTIGO 09/30/2007  . Essential hypertension 07/07/2007  .  CORONARY ARTERY DISEASE 07/07/2007  . PROSTATE CANCER, HX OF 07/07/2007    Athziri Freundlich, PT 07/30/2015, 8:07 AM  [facility] [address] [place], [place], [address] Phone: [phone]   Fax:  [phone]  Name: Kristopher Wilson MRN: [id number] Date of Birth: 07-14-1936

## 2015-08-04 ENCOUNTER — Encounter: Payer: Self-pay | Admitting: Internal Medicine

## 2015-08-04 DIAGNOSIS — Z86010 Personal history of colonic polyps: Secondary | ICD-10-CM

## 2015-08-04 NOTE — Progress Notes (Signed)
Quick Note:  5 mm adenoma Colitis in remission Recall 2019 ______

## 2015-08-06 ENCOUNTER — Encounter: Payer: Self-pay | Admitting: Physical Therapy

## 2015-08-06 ENCOUNTER — Ambulatory Visit: Payer: Federal, State, Local not specified - PPO | Admitting: Physical Therapy

## 2015-08-06 DIAGNOSIS — M542 Cervicalgia: Secondary | ICD-10-CM

## 2015-08-06 DIAGNOSIS — M436 Torticollis: Secondary | ICD-10-CM

## 2015-08-06 NOTE — Therapy (Signed)
[facility] [address] [place], [place], [address] Phone: [phone]   Fax:  [phone]  Physical Therapy Treatment  Patient Details  Name: Kristopher Wilson MRN: [id number] Date of Birth: [birth year] Referring Provider: Dr. Stevie Kern  Encounter Date: 08/06/2015      PT End of Session - 08/06/15 0942    Visit Number 3   Number of Visits 10   Date for PT Re-Evaluation 09/03/15   PT Start Time 0929   PT Stop Time 1026   PT Time Calculation (min) 57 min   Activity Tolerance Patient tolerated treatment well   Behavior During Therapy [facility] for tasks assessed/performed      Past Medical History  Diagnosis Date  . CAD (coronary artery disease)   . Hypertension   . Ulcer   . GERD (gastroesophageal reflux disease)   . Hyperthyroidism   . Hyperlipidemia   . RLS (restless legs syndrome)   . CKD (chronic kidney disease)   . CHF (congestive heart failure) ([place])   . VENOUS INSUFFICIENCY 07/28/2010  . PERIPHERAL NEUROPATHY 10/11/2007  . COLONIC POLYPS, ADENOMATOUS, HX OF   . ULCERATIVE COLITIS, LEFT SIDED 2000  . BACK PAIN, LUMBAR 10/18/2009  . URTICARIA, ALLERGIC 08/05/2009  . Benign paroxysmal positional vertigo 09/30/2007  . 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 PROSTATE CANCER, HX OF 07/07/2007    s/p prostatectomy  . Herpes zoster with other nervous system complications(053.19)   . Loss of hearing     Bil/has hearing aids in both ears    Past Surgical History  Procedure Laterality Date  . Tonsillectomy    . Angioplasty  1990's/2004    stent placement 2  . Inguinal hernia repair    . Prostatectomy    . Colonoscopy w/ biopsies and polypectomy  06/06/2009    adenomatous polyp, diverticulosis, colitis  . Rotator cuff repair Left   . Retinal laser procedure Right     There were no vitals filed for this visit.  Visit Diagnosis:  Cervical pain  Stiffness of cervical spine      Subjective Assessment -  08/06/15 0935    Subjective No complain of pain in neck, pt reports 75to 80% better. Pt will  ot be here next week due to traveling to the beach to play golf   Multiple Pain Sites No                         [facility] Adult PT Treatment/Exercise - 08/06/15 0001    Posture/Postural Control   Posture/Postural Control Postural limitations   Postural Limitations Forward head;Rounded Shoulders  tilt head to left   Exercises   Exercises Neck;Shoulder   Neck Exercises: Machines for Strengthening   UBE (Upper Arm Bike) Level 2x 6 minutes (3/3)   Neck Exercises: Seated   Neck Retraction 20 reps   Postural Training scapular squeezes 2x10    Other Seated Exercise Cervcial AROM in all directions 3x5  seconds each   Neck Exercises: Supine   Neck Retraction 10 reps  with 5 sec hold   Shoulder Exercises: Supine   Horizontal ABduction Strengthening;Both;20 reps;Theraband   Theraband Level (Shoulder Horizontal ABduction) Level 2 (Red)   Manual Therapy   Manual Therapy Myofascial release;Soft tissue mobilization   Manual therapy comments soft tissue elongation and trigger point release to bilateral suboccipitals and Rt cervical paraspinals                PT  Education - 08/06/15 0958    Education provided Yes   Education Details cervical retraction   Person(s) Educated Patient   Methods Explanation;Demonstration;Handout   Comprehension Returned demonstration          PT Short Term Goals - 08/06/15 0950    PT SHORT TERM GOAL #1   Title Independent with Cervical ROM exercises   Time 3   Period Weeks   Status On-going   PT SHORT TERM GOAL #2   Title cervical pain with turning his head decreased by 25%   Time 3   Period Weeks   Status On-going   PT SHORT TERM GOAL #3   Title cervical pain with sleeping on sided decreased by >/= 25%   Time 3   Period Weeks   Status On-going           PT Long Term Goals - 07/23/15 1650    PT LONG TERM GOAL #1   Title  Indpendent with HEP and how to progress himself   Time 6   Period Weeks   Status New   PT LONG TERM GOAL #2   Title cervical pain decreased >/= 75% due to full cervical rotation   Time 3   Period Weeks   Status New   PT LONG TERM GOAL #3   Title sleeping on his side with cervical pain decreaased >/= 75% due to full cervical sidebending   Time 6   Period Weeks   Status New   PT LONG TERM GOAL #4   Title FOTO score </= 33% limitation   Time 6   Period Weeks   Status New               Plan - 08/06/15 [id number]    Clinical Impression Statement Pt with limited ROM cervical and poor posture:cervical protraction and decreased thoracic mobility. Pt reports no headaches since 10 day's. Pt will continue to benefit from skilled PT for manulal therapy, flexibility and postural strength.    Pt will benefit from skilled therapeutic intervention in order to improve on the following deficits Decreased range of motion;Pain;Decreased mobility;Increased muscle spasms   Rehab Potential Excellent   PT Frequency 1x / week   PT Duration 6 weeks   PT Treatment/Interventions Cryotherapy;Electrical Stimulation;Therapeutic exercise;Therapeutic activities;Neuromuscular re-education;Patient/family education;Manual techniques;Passive range of motion   PT Next Visit Plan soft tissue work to cervical  on the right, joint mobilization to cervical and upper thoracic, postural awarness   PT Home Exercise Plan postural awareness   Consulted and Agree with Plan of Care Patient        Problem List Patient Active Problem List   Diagnosis Date Noted  . Neck pain on right side 07/22/2015  . Long term current use of antithrombotics/antiplatelets - clopidogrel and ASA 03/28/2015  . Infected sebaceous cyst 11/20/2014  . Left shoulder pain 05/31/2014  . Restless leg syndrome 11/14/2013  . Night sweats 09/12/2013  . Hyperlipidemia 12/18/2010  . VENOUS INSUFFICIENCY 07/28/2010  . 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 ULCERATIVE COLITIS, LEFT SIDED 12/27/2009  . COLONIC POLYPS, ADENOMATOUS, HX OF 12/27/2009  . BACK PAIN, LUMBAR 10/18/2009  . RESTLESS LEG SYNDROME 05/31/2009  . GERD 04/18/2008  . BENIGN PAROXYSMAL POSITIONAL VERTIGO 09/30/2007  . Essential hypertension 07/07/2007  . CORONARY ARTERY DISEASE 07/07/2007  . PROSTATE CANCER, HX OF 07/07/2007    NAUMANN-HOUEGNIFIO,Cristy Colmenares PTA 08/06/2015, 10:15 AM  [facility] [address], [place], [place], [address]  Phone: [phone]   Fax:  [phone]  Name: Kristopher Wilson MRN: [id number] Date of Birth: 07-Dec-1935

## 2015-08-06 NOTE — Patient Instructions (Addendum)
.  Neck: Retraction    Sit with back and head straight. Pull chin back to line up ear with shoulder. Do not turn or tilt head. Hold _5___ seconds. Repeat __10__ times. Do many times during your day. CAUTION: Movement should be gentle, steady and slow.  Copyright  VHI. All rights reserved.

## 2015-08-27 ENCOUNTER — Ambulatory Visit: Payer: Federal, State, Local not specified - PPO | Attending: Family Medicine

## 2015-08-27 DIAGNOSIS — M542 Cervicalgia: Secondary | ICD-10-CM | POA: Diagnosis present

## 2015-08-27 DIAGNOSIS — M436 Torticollis: Secondary | ICD-10-CM | POA: Diagnosis present

## 2015-08-27 NOTE — Therapy (Signed)
[facility] [address], [place], [place], [address] Phone: [phone]   Fax:  [phone]  Physical Therapy Treatment  Patient Details  Name: Kristopher Wilson MRN: [id number] Date of Birth: [birth year] Referring Provider: Dr. Stevie Kern  Encounter Date: 08/27/2015      PT End of Session - 08/27/15 0959    Visit Number 4   PT Start Time 0929   PT Stop Time 0959   PT Time Calculation (min) 30 min   Activity Tolerance Patient tolerated treatment well   Behavior During Therapy [facility] for tasks assessed/performed      Past Medical History  Diagnosis Date  . CAD (coronary artery disease)   . Hypertension   . Ulcer   . GERD (gastroesophageal reflux disease)   . Hyperthyroidism   . Hyperlipidemia   . RLS (restless legs syndrome)   . CKD (chronic kidney disease)   . CHF (congestive heart failure) ([place])   . VENOUS INSUFFICIENCY 07/28/2010  . PERIPHERAL NEUROPATHY 10/11/2007  . COLONIC POLYPS, ADENOMATOUS, HX OF   . ULCERATIVE COLITIS, LEFT SIDED 2000  . BACK PAIN, LUMBAR 10/18/2009  . URTICARIA, ALLERGIC 08/05/2009  . Benign paroxysmal positional vertigo 09/30/2007  . 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 PROSTATE CANCER, HX OF 07/07/2007    s/p prostatectomy  . Herpes zoster with other nervous system complications(053.19)   . Loss of hearing     Bil/has hearing aids in both ears    Past Surgical History  Procedure Laterality Date  . Tonsillectomy    . Angioplasty  1990's/2004    stent placement 2  . Inguinal hernia repair    . Prostatectomy    . Colonoscopy w/ biopsies and polypectomy  06/06/2009    adenomatous polyp, diverticulosis, colitis  . Rotator cuff repair Left   . Retinal laser procedure Right     There were no vitals filed for this visit.  Visit Diagnosis:  Cervical pain  Stiffness of cervical spine      Subjective Assessment - 08/27/15 0928    Subjective Pt was at the beach and then  traveled to [place].  Still feeling good.  Ready for D/C today.     Currently in Pain? No/denies            [facility] PT Assessment - 08/27/15 0001    Assessment   Medical Diagnosis M54.2 Neck pain on right side   Onset Date/Surgical Date 06/08/15   Precautions   Precautions Other (comment)   Precaution Comments Cancer precautions   Observation/Other Assessments   Focus on Therapeutic Outcomes (FOTO)  31% limitation   Posture/Postural Control   Posture/Postural Control Postural limitations   Postural Limitations Forward head;Rounded Shoulders  tilt head to left   ROM / Strength   AROM / PROM / Strength AROM   AROM   Cervical Flexion full    Cervical Extension full   Cervical - Right Side Bend full   Cervical - Left Side Bend full   Cervical - Right Rotation full   Cervical - Left Rotation full                     [facility] Adult PT Treatment/Exercise - 08/27/15 0001    Neck Exercises: Machines for Strengthening   UBE (Upper Arm Bike) Level 2x 6 minutes (3/3)   Neck Exercises: Seated   Other Seated Exercise Cervcial AROM in all directions 3x5  seconds each   Manual Therapy   Manual  Therapy Myofascial release;Soft tissue mobilization   Manual therapy comments soft tissue elongation and trigger point release to bilateral suboccipitals and Rt cervical paraspinals                  PT Short Term Goals - 08/06/15 0950    PT SHORT TERM GOAL #1   Title Independent with Cervical ROM exercises   Time 3   Period Weeks   Status On-going   PT SHORT TERM GOAL #2   Title cervical pain with turning his head decreased by 25%   Time 3   Period Weeks   Status On-going   PT SHORT TERM GOAL #3   Title cervical pain with sleeping on sided decreased by >/= 25%   Time 3   Period Weeks   Status On-going           PT Long Term Goals - 29-Aug-2015 9166    PT LONG TERM GOAL #1   Title Indpendent with HEP and how to progress himself   Status Achieved   PT LONG TERM  GOAL #2   Title cervical pain decreased >/= 75% due to full cervical rotation   PT LONG TERM GOAL #3   Title sleeping on his side with cervical pain decreaased >/= 75% due to full cervical sidebending   Status Achieved   PT LONG TERM GOAL #4   Title FOTO score </= 33% limitation   Status Achieved  31% limited               Plan - 29-Aug-2015 0958    Clinical Impression Statement Pt reports 80-90% overall improvement and denies any neck pain at this time.  FOTO is 31% limitation and all goals are met.  Pt is going to D/C to HEP and will continue with this program and postural corrections.     PT Next Visit Plan D/C PT   Consulted and Agree with Plan of Care Patient          G-Codes - 08/29/15 1003    Functional Assessment Tool Used FOTO: 31% limitation   Functional Limitation Changing and maintaining body position   Changing and Maintaining Body Position Goal Status (M6004) At least 20 percent but less than 40 percent impaired, limited or restricted   Changing and Maintaining Body Position Discharge Status (H9977) At least 20 percent but less than 40 percent impaired, limited or restricted      Problem List Patient Active Problem List   Diagnosis Date Noted  . Neck pain on right side 07/22/2015  . Long term current use of antithrombotics/antiplatelets - clopidogrel and ASA 03/28/2015  . Infected sebaceous cyst 11/20/2014  . Left shoulder pain 05/31/2014  . Restless leg syndrome 11/14/2013  . Night sweats 09/12/2013  . Hyperlipidemia 12/18/2010  . VENOUS INSUFFICIENCY 07/28/2010  . 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 ULCERATIVE COLITIS, LEFT SIDED 12/27/2009  . COLONIC POLYPS, ADENOMATOUS, HX OF 12/27/2009  . BACK PAIN, LUMBAR 10/18/2009  . RESTLESS LEG SYNDROME 05/31/2009  . GERD 04/18/2008  . BENIGN PAROXYSMAL POSITIONAL VERTIGO 09/30/2007  . Essential hypertension 07/07/2007  . CORONARY ARTERY DISEASE 07/07/2007  . PROSTATE CANCER, HX OF 07/07/2007   PHYSICAL THERAPY DISCHARGE SUMMARY  Visits from Start of Care: 4  Current functional level related to goals / functional outcomes:Pt reports 80-90% overall improvement sine the start of care.  Pt has met all goals.     Remaining deficits: No remaining deficits   Education / Equipment: HEP, posture  Plan: Patient agrees to discharge.  Patient goals were met. Patient is being discharged due to meeting the stated rehab goals.  ?????      Lemonte Al, PT 08/27/2015, 10:05 AM  [facility] [address], [place], [place], [address] Phone: [phone]   Fax:  [phone]  Name: Kristopher Wilson MRN: [id number] Date of Birth: June 06, 1936

## 2015-09-16 ENCOUNTER — Encounter: Payer: Self-pay | Admitting: Adult Health

## 2015-09-16 ENCOUNTER — Ambulatory Visit (INDEPENDENT_AMBULATORY_CARE_PROVIDER_SITE_OTHER): Payer: Federal, State, Local not specified - PPO | Admitting: Adult Health

## 2015-09-16 VITALS — BP 124/78 | Temp 97.3°F | Ht 75.0 in | Wt 213.1 lb

## 2015-09-16 DIAGNOSIS — I10 Essential (primary) hypertension: Secondary | ICD-10-CM | POA: Diagnosis not present

## 2015-09-16 DIAGNOSIS — Z7689 Persons encountering health services in other specified circumstances: Secondary | ICD-10-CM

## 2015-09-16 NOTE — Progress Notes (Signed)
Pre visit review using our clinic review tool, if applicable. No additional management support is needed unless otherwise documented below in the visit note. 

## 2015-09-16 NOTE — Progress Notes (Signed)
HPI:  Kristopher Wilson is here to establish care.  Last PCP and physical: December 2015 with MD Sherren Mocha  Has the following chronic problems that require follow up and concerns today:  He has no concerns today.    ROS negative for unless reported above: fevers, chills,feeling poorly, unintentional weight loss, hearing or vision loss, chest pain, palpitations, leg claudication, struggling to breath,Not feeling congested in the chest, no orthopenia, no cough,no wheezing, normal appetite, no soft tissue swelling, no hemoptysis, melena, hematochezia, hematuria, falls, loc, si, or thoughts of self harm.  Immunizations:UTD Diet: Eats healthy.  Exercise: Golf, works in the yard Colonoscopy: 07/2015  Is Followed by  - [facility]- [facility] - Twice a year - Cardiology - NP Servando Snare - Twice a year - GI - Carlean Purl - PRN - [facility] - Once a year - Dermatology  - yearly   Past Medical History  Diagnosis Date  . CAD (coronary artery disease)   . Hypertension   . Ulcer   . GERD (gastroesophageal reflux disease)   . Hyperthyroidism   . Hyperlipidemia   . RLS (restless legs syndrome)   . CKD (chronic kidney disease)   . CHF (congestive heart failure) ([place])   . VENOUS INSUFFICIENCY 07/28/2010  . PERIPHERAL NEUROPATHY 10/11/2007  . COLONIC POLYPS, ADENOMATOUS, HX OF   . ULCERATIVE COLITIS, LEFT SIDED 2000  . BACK PAIN, LUMBAR 10/18/2009  . URTICARIA, ALLERGIC 08/05/2009  . Benign paroxysmal positional vertigo 09/30/2007  . 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 PROSTATE CANCER, HX OF 07/07/2007    s/p prostatectomy  . Herpes zoster with other nervous system complications(053.19)   . Loss of hearing     Bil/has hearing aids in both ears    Past Surgical History  Procedure Laterality Date  . Tonsillectomy    . Angioplasty  1990's/2004    stent placement 2  . Inguinal hernia repair    . Prostatectomy    . Colonoscopy w/ biopsies and polypectomy  06/06/2009    adenomatous polyp,  diverticulosis, colitis  . Rotator cuff repair Left   . Retinal laser procedure Right     Family History  Problem Relation Age of Onset  . Heart attack Father   . Throat cancer Brother   . Multiple sclerosis Son   . Diabetes Maternal Uncle     x 2  . Colon cancer Neg Hx   . Multiple sclerosis Brother   . Cancer Brother   . Diabetes Maternal Uncle   . Stroke Neg Hx     Social History   Social History  . Marital Status: Married    Spouse Name: N/A  . Number of Children: N/A  . Years of Education: N/A   Occupational History  . retired   . [employer]    Social History Main Topics  . Smoking status: Former Smoker    Types: Cigarettes    Quit date: 09/08/1975  . Smokeless tobacco: Never Used  . Alcohol Use: 4.2 oz/week    7 Standard drinks or equivalent per week     Comment: 1-2 "high balls" daily  . Drug Use: No  . Sexual Activity: Not on file   Other Topics Concern  . Not on file   Social History Narrative     Current outpatient prescriptions:  .  amLODipine (NORVASC) 5 MG tablet, Take 1 tablet (5 mg total) by mouth daily., Disp: 30 tablet, Rfl: 0 .  aspirin 81 MG tablet, Take 81 mg by  mouth daily.  , Disp: , Rfl:  .  atorvastatin (LIPITOR) 40 MG tablet, Take 1 tablet (40 mg total) by mouth daily., Disp: 90 tablet, Rfl: 3 .  carvedilol (COREG) 6.25 MG tablet, TAKE 1 TABLET TWICE A DAY  WITH MEALS, Disp: 60 tablet, Rfl: 0 .  clopidogrel (PLAVIX) 75 MG tablet, Take 1 tablet (75 mg total) by mouth daily., Disp: 30 tablet, Rfl: 0 .  isosorbide mononitrate (IMDUR) 30 MG 24 hr tablet, Take 1 tablet (30 mg total) by mouth daily., Disp: 30 tablet, Rfl: 6 .  Mesalamine (ASACOL HD) 800 MG TBEC, TAKE 2 TABLETS (1,600MG     TOTAL) 3 TIMES A DAY, Disp: 150 tablet, Rfl: 0 .  nitroGLYCERIN (NITROSTAT) 0.4 MG SL tablet, DISSOLVE 1 TABLET UNDER THETONGUE EVERY 5 MINUTES AS  NEEDED, Disp: 25 tablet, Rfl: 1 .  oxyCODONE-acetaminophen (PERCOCET) 10-325 MG tablet, Take 1  tablet by mouth 3 (three) times daily., Disp: 200 tablet, Rfl: 0 .  pantoprazole (PROTONIX) 40 MG tablet, Take 1 tablet (40 mg total) by mouth daily., Disp: 30 tablet, Rfl: 0 .  pramipexole (MIRAPEX) 1 MG tablet, One by mouth each bedtime for RLS, Disp: 30 tablet, Rfl: 0  EXAM:  Filed Vitals:   09/16/15 1236  BP: 124/78  Temp: 97.3 F (36.3 C)    Body mass index is 26.64 kg/(m^2).  GENERAL: vitals reviewed and listed above, alert, oriented, appears well hydrated and in no acute distress  HEENT: atraumatic, conjunttiva clear, no obvious abnormalities on inspection of external nose and ears  NECK: Neck is soft and supple without masses, no adenopathy or thyromegaly, trachea midline, no JVD. Normal range of motion.   LUNGS: clear to auscultation bilaterally, no wheezes, rales or rhonchi, good air movement  CV: Regular rate and rhythm, normal S1/S2, no audible murmurs, gallops, or rubs. No carotid bruit and no peripheral edema.   MS: moves all extremities without noticeable abnormality. No edema noted. Walks with a limp from left leg.   Abd: soft/nontender/nondistended/normal bowel sounds   Skin: warm and dry, no rash   Extremities: No clubbing, cyanosis, or edema. Capillary refill is WNL. Pulses intact bilaterally in upper and lower extremities.   Neuro: CN II-XII intact, sensation and reflexes normal throughout, 5/5 muscle strength in bilateral upper and lower extremities. Normal finger to nose. Normal rapid alternating movements. Normal romberg. No pronator drift.   PSYCH: pleasant and cooperative, no obvious depression or anxiety  ASSESSMENT AND PLAN:  1. Encounter to establish care - He will follow up for his yearly physical. If he needs anything in the meantime, he is to get a hold of me.  - Continue to work on diet and exercise - BP well controlled.  - Paper work signed for [employer] - due to nerve damage in his left foot.    Discussed the following  assessment and plan:  -We reviewed the PMH, PSH, FH, SH, Meds and Allergies. -We provided refills for any medications we will prescribe as needed. -We addressed current concerns per orders and patient instructions. -We have asked for records for pertinent exams, studies, vaccines and notes from previous providers. -We have advised patient to follow up per instructions below.   -Patient advised to return or notify a provider immediately if symptoms worsen or persist or new concerns arise.   [employer]

## 2015-09-16 NOTE — Patient Instructions (Signed)
It was great meeting you today!  Please follow up with me for your physical. If you need anything in the meantime, please let me know.    

## 2015-09-27 ENCOUNTER — Telehealth: Payer: Self-pay | Admitting: Family Medicine

## 2015-09-27 NOTE — Telephone Encounter (Signed)
Ok to establish, but pt may not get CPE in March.  We will make sure he gets next available new pt slot

## 2015-09-27 NOTE — Telephone Encounter (Signed)
Pt lives about 1-2 miles from the new office in [place] and would like to change from [facility] for proximity to home. He was seeing Dr. Sherren Mocha who is not going to be there anymore and would like to see Dr. Birdie Riddle. Please advise. (pt req cpe for March if ok to est with [facility])

## 2015-10-04 ENCOUNTER — Encounter: Payer: Self-pay | Admitting: *Deleted

## 2015-10-28 ENCOUNTER — Other Ambulatory Visit: Payer: Self-pay | Admitting: Family Medicine

## 2015-10-31 ENCOUNTER — Other Ambulatory Visit: Payer: Federal, State, Local not specified - PPO

## 2015-10-31 ENCOUNTER — Telehealth: Payer: Self-pay | Admitting: Adult Health

## 2015-10-31 DIAGNOSIS — G2581 Restless legs syndrome: Secondary | ICD-10-CM

## 2015-10-31 DIAGNOSIS — E785 Hyperlipidemia, unspecified: Secondary | ICD-10-CM

## 2015-10-31 MED ORDER — ATORVASTATIN CALCIUM 40 MG PO TABS: 40.0000 mg | ORAL_TABLET | Freq: Every day | ORAL | Status: DC

## 2015-10-31 MED ORDER — PRAMIPEXOLE DIHYDROCHLORIDE 1 MG PO TABS: ORAL_TABLET | ORAL | Status: DC

## 2015-10-31 MED ORDER — CARVEDILOL 6.25 MG PO TABS: ORAL_TABLET | ORAL | Status: DC

## 2015-10-31 NOTE — Telephone Encounter (Signed)
Rx sent to pharmacy for 90 day supply and noted pt needs to schedule physical exam for further refills.

## 2015-10-31 NOTE — Telephone Encounter (Signed)
Pt request refill of the following:      pramipexole (MIRAPEX) 1 MG tablet,  carvedilol (COREG) 6.25 MG tablet ,  atorvastatin (LIPITOR) 40 MG tablet   Phamacy:  [facility]

## 2015-11-06 ENCOUNTER — Ambulatory Visit: Payer: Federal, State, Local not specified - PPO | Admitting: Nurse Practitioner

## 2015-11-07 ENCOUNTER — Encounter: Payer: Federal, State, Local not specified - PPO | Admitting: Adult Health

## 2015-11-19 ENCOUNTER — Encounter: Payer: Federal, State, Local not specified - PPO | Attending: Physical Medicine & Rehabilitation

## 2015-11-19 ENCOUNTER — Encounter: Payer: Self-pay | Admitting: Physical Medicine & Rehabilitation

## 2015-11-19 ENCOUNTER — Ambulatory Visit (HOSPITAL_COMMUNITY)
Admission: RE | Admit: 2015-11-19 | Discharge: 2015-11-19 | Disposition: A | Discharge status: Home / Self Care | Payer: Federal, State, Local not specified - PPO | Source: Ambulatory Visit | Attending: Physical Medicine & Rehabilitation | Admitting: Physical Medicine & Rehabilitation

## 2015-11-19 ENCOUNTER — Ambulatory Visit (HOSPITAL_BASED_OUTPATIENT_CLINIC_OR_DEPARTMENT_OTHER): Payer: Federal, State, Local not specified - PPO | Admitting: Physical Medicine & Rehabilitation

## 2015-11-19 VITALS — BP 125/82 | HR 69

## 2015-11-19 DIAGNOSIS — M25512 Pain in left shoulder: Secondary | ICD-10-CM

## 2015-11-19 DIAGNOSIS — E785 Hyperlipidemia, unspecified: Secondary | ICD-10-CM | POA: Insufficient documentation

## 2015-11-19 DIAGNOSIS — Z79899 Other long term (current) drug therapy: Secondary | ICD-10-CM | POA: Diagnosis not present

## 2015-11-19 DIAGNOSIS — M542 Cervicalgia: Secondary | ICD-10-CM | POA: Diagnosis not present

## 2015-11-19 DIAGNOSIS — G8929 Other chronic pain: Secondary | ICD-10-CM | POA: Insufficient documentation

## 2015-11-19 DIAGNOSIS — I2510 Atherosclerotic heart disease of native coronary artery without angina pectoris: Secondary | ICD-10-CM | POA: Insufficient documentation

## 2015-11-19 DIAGNOSIS — M5416 Radiculopathy, lumbar region: Secondary | ICD-10-CM | POA: Diagnosis not present

## 2015-11-19 DIAGNOSIS — B0229 Other postherpetic nervous system involvement: Secondary | ICD-10-CM

## 2015-11-19 DIAGNOSIS — M47812 Spondylosis without myelopathy or radiculopathy, cervical region: Secondary | ICD-10-CM | POA: Diagnosis not present

## 2015-11-19 DIAGNOSIS — Z9889 Other specified postprocedural states: Secondary | ICD-10-CM | POA: Diagnosis not present

## 2015-11-19 DIAGNOSIS — K219 Gastro-esophageal reflux disease without esophagitis: Secondary | ICD-10-CM | POA: Diagnosis not present

## 2015-11-19 DIAGNOSIS — Z87891 Personal history of nicotine dependence: Secondary | ICD-10-CM | POA: Diagnosis not present

## 2015-11-19 DIAGNOSIS — Z5181 Encounter for therapeutic drug level monitoring: Secondary | ICD-10-CM

## 2015-11-19 DIAGNOSIS — G894 Chronic pain syndrome: Secondary | ICD-10-CM

## 2015-11-19 DIAGNOSIS — E0590 Thyrotoxicosis, unspecified without thyrotoxic crisis or storm: Secondary | ICD-10-CM | POA: Diagnosis not present

## 2015-11-19 DIAGNOSIS — G2581 Restless legs syndrome: Secondary | ICD-10-CM | POA: Insufficient documentation

## 2015-11-19 DIAGNOSIS — I10 Essential (primary) hypertension: Secondary | ICD-10-CM | POA: Diagnosis not present

## 2015-11-19 DIAGNOSIS — N189 Chronic kidney disease, unspecified: Secondary | ICD-10-CM | POA: Diagnosis not present

## 2015-11-19 MED ORDER — LIDOCAINE 5 % EX PTCH: 1.0000 | MEDICATED_PATCH | CUTANEOUS | Status: DC

## 2015-11-19 NOTE — Progress Notes (Signed)
Subjective:    Patient ID: Kristopher Wilson, male    DOB: Sep 15, 1935, 80 y.o.   MRN: [id number] CC Left foot pain  HPI 80 year old male with history of shingles. He had a rash on his back going down his left thigh. The rash has resolved but he has had persistent left foot pain since that time. It is primarily the lateral border of the foot and somewhat on the bottom of the foot. At times when his bladder is very full it does trigger similar sensations. He has been evaluated by his primary care physician and was placed on oxycodone. He does not recall being on any medicines such as gabapentin or Lyrica or Lidoderm. No constipation, history of ulcerative colitis.  Other pain symptoms include intermittent right-sided neck pain. He also has history of left rotator cuff repair over one year ago but his left shoulder has started bothering him again recently.Patient's neck pain is not bad today. On bad days he has difficulty looking toward the right side. Patient has a follow-up appointment with Dr. Durward Fortes his orthopedic surgeon next week Patient has had x-rays of his left shoulder about 14 months ago  Patient does not recall any recent x-rays of his neck or any other area. Looking through Epic there was a chest x-ray in 2015  Patient has been taking oxycodone 10 mg twice a day on average. His primary care physician has been prescribing this medication.  Patient is independent with all his self-care and mobility. He still works one day per week, still drives.   Pain Inventory Average Pain 5 Pain Right Now 3 My pain is intermittent and aching  In the last 24 hours, has pain interfered with the following? General activity 0 Relation with others 0 Enjoyment of life 3 What TIME of day is your pain at its worst? varies Sleep (in general) Poor  Pain is worse with: unsure Pain improves with: heat/ice and medication Relief from Meds: 7  Mobility walk without assistance how many minutes can  you walk? 30-45 ability to climb steps?  yes do you drive?  yes  Function retired  Neuro/Psych bladder control problems numbness tingling  Prior Studies Any changes since last visit?  no EXAM: CERVICAL SPINE - COMPLETE 4+ VIEW  COMPARISON: None  FINDINGS: All degenerative facet disease bilaterally. Mild left neural foraminal narrowing at C4-5 and C5-6. No right neural foraminal narrowing. Normal alignment. No fracture. Prevertebral soft tissues are normal.  IMPRESSION: Cervical spondylosis as above. No acute bony abnormality.   Electronically Signed  By: Rolm Baptise M.D.  On: 11/19/2015 11:29 Physicians involved in your care Any changes since last visit?  no   Family History  Problem Relation Age of Onset  . Heart attack Father   . Throat cancer Brother   . Multiple sclerosis Son   . Diabetes Maternal Uncle     x 2  . Colon cancer Neg Hx   . Multiple sclerosis Brother   . Diabetes Maternal Uncle   . Stroke Neg Hx    Social History   Social History  . Marital Status: Married    Spouse Name: N/A  . Number of Children: N/A  . Years of Education: N/A   Occupational History  . retired   . [employer]    Social History Main Topics  . Smoking status: Former Smoker    Types: Cigarettes    Quit date: 09/08/1975  . Smokeless tobacco: Never Used  . Alcohol Use: 4.2  oz/week    7 Standard drinks or equivalent per week     Comment: 1-2 "high balls" daily  . Drug Use: No  . Sexual Activity: Not Asked   Other Topics Concern  . 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 All live in [place] area   Works part time at [employer]   Past Surgical History  Procedure Laterality Date  . Tonsillectomy    . Angioplasty  1990's/2004    stent placement 2  . Inguinal hernia repair    . Prostatectomy    . Colonoscopy w/ biopsies and polypectomy   06/06/2009    adenomatous polyp, diverticulosis, colitis  . Rotator cuff repair Left   . Retinal laser procedure Right 2016   Past Medical History  Diagnosis Date  . CAD (coronary artery disease)   . Hypertension   . Ulcer   . GERD (gastroesophageal reflux disease)   . Hyperthyroidism   . Hyperlipidemia   . RLS (restless legs syndrome)   . CKD (chronic kidney disease)   . CHF (congestive heart failure) ([place])   . VENOUS INSUFFICIENCY 07/28/2010  . PERIPHERAL NEUROPATHY 10/11/2007  . COLONIC POLYPS, ADENOMATOUS, HX OF   . ULCERATIVE COLITIS, LEFT SIDED 2000  . BACK PAIN, LUMBAR 10/18/2009  . URTICARIA, ALLERGIC 08/05/2009  . Benign paroxysmal positional vertigo 09/30/2007  . 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 PROSTATE CANCER, HX OF 07/07/2007    s/p prostatectomy  . Herpes zoster with other nervous system complications(053.19)   . Loss of hearing     Bil/has hearing aids in both ears  . Nerve damage of left foot    BP 125/82 mmHg  Pulse 69  SpO2 95%  Opioid Risk Score:   Fall Risk Score:  `1  Depression screen PHQ 2/9  Depression screen [facility] 2/9 11/19/2015 09/16/2015 09/16/2015 09/13/2014 11/14/2013  Decreased Interest 0 0 0 0 0  Down, Depressed, Hopeless 0 0 0 0 0  PHQ - 2 Score 0 0 0 0 0  Altered sleeping 3 - - - -  Tired, decreased energy 1 - - - -  Change in appetite 0 - - - -  Feeling bad or failure about yourself  0 - - - -  Trouble concentrating 0 - - - -  Moving slowly or fidgety/restless 0 - - - -  Suicidal thoughts 0 - - - -  PHQ-9 Score 4 - - - -  Difficult doing work/chores Not difficult at all - - - -     Review of Systems  Constitutional: Positive for chills and diaphoresis.  Cardiovascular: Positive for leg swelling.  Gastrointestinal: Positive for diarrhea.  All other systems reviewed and are negative.      Objective:   Physical Exam  Constitutional: He is oriented to person, place, and time. He appears well-developed and well-nourished.  HENT:    Head: Normocephalic and atraumatic.  Eyes: Conjunctivae and EOM are normal. Pupils are equal, round, and reactive to light.  Neurological: He is alert and oriented to person, place, and time. A sensory deficit is present. Coordination and gait normal.  Reflex Scores:      Patellar reflexes are 0 on the right side and 0 on the left side.      Achilles reflexes are 0 on the right side and 0 on the left side. Patient with 5/5  strength in bilateral deltoid, biceps, triceps, grip 5/5 bilateral hip flexor and knee extensor/5 in the right ankle dorsal flexor and plantar flexor 4/5 in the left ankle dorsiflexor and plantar flexor 0 at the toe dorsiflexor, EHL and small toes  3 minus toe flexors on the left  Decreased sensation L5Left  Psychiatric: He has a normal mood and affect.  Nursing note and vitals reviewed.   Decreased sensation in the left L5 and left S1 dermatomes to pinprick.Decreased proprioception left great toe  Left shoulder negative impingement sign full range of motion.     Assessment & Plan:  1. Postherpetic neuralgia with chronic L5 radiculopathy left lower extremity. There has been no progression to his symptoms. No symptoms in the right lower extremity. No back pain.  Patient has been maintained on oxycodone for several years 10 mg twice a day. Discussed that at twice a day dosing it is very unlikely that he has any dependence. He has not been trialed on more widely utilized treatment such as Lidoderm patch or Lyrica or gabapentin. He may be able to get off of the oxycodone and use one or more nonnarcotic options.  We will prescribe Lidoderm patch. He will continue it for 2 days along with the oxycodone at night and then discontinue the oxycodone at night If this is helpful by itself will continue this medication and discontinue the oxycodone altogether  2. Chronic intermittent neck pain I suspect he has spondylosis-His cervical spine films have been reviewed. No  significant cervical degenerative disc. There is mild foraminal narrowingAt C4-5 and C5-6 And may be responsible for his left shoulder pain. At this point his neck is really not bothering him. His symptoms are mainly right-sided neck pain. This could potentially be related to cervical facet joint and may benefit from cervical medial branch blocks for further diagnostic information

## 2015-11-19 NOTE — Patient Instructions (Signed)
You may take the oxycodone and use the patch for 2 nights then on the third night do not take the oxycodone and see how your pain relief is that evening. If the patch by itself is not helpful we may combine it with a medicine called gabapentin or Lyrica

## 2015-11-20 ENCOUNTER — Encounter: Payer: Self-pay | Admitting: Nurse Practitioner

## 2015-11-20 ENCOUNTER — Ambulatory Visit (INDEPENDENT_AMBULATORY_CARE_PROVIDER_SITE_OTHER): Payer: Federal, State, Local not specified - PPO | Admitting: Nurse Practitioner

## 2015-11-20 VITALS — BP 130/80 | HR 60 | Ht 75.0 in | Wt 207.4 lb

## 2015-11-20 DIAGNOSIS — E785 Hyperlipidemia, unspecified: Secondary | ICD-10-CM

## 2015-11-20 DIAGNOSIS — I10 Essential (primary) hypertension: Secondary | ICD-10-CM

## 2015-11-20 DIAGNOSIS — I2510 Atherosclerotic heart disease of native coronary artery without angina pectoris: Secondary | ICD-10-CM

## 2015-11-20 LAB — BASIC METABOLIC PANEL WITH GFR
BUN: 17 mg/dL (ref 7–25)
CO2: 23 mmol/L (ref 20–31)
Calcium: 9 mg/dL (ref 8.6–10.3)
Chloride: 104 mmol/L (ref 98–110)
Creat: 1.17 mg/dL (ref 0.70–1.18)
Glucose, Bld: 100 mg/dL — ABNORMAL HIGH (ref 65–99)
Potassium: 4.3 mmol/L (ref 3.5–5.3)
Sodium: 139 mmol/L (ref 135–146)

## 2015-11-20 LAB — LIPID PANEL
Cholesterol: 149 mg/dL (ref 125–200)
HDL: 33 mg/dL — ABNORMAL LOW
LDL Cholesterol: 64 mg/dL
Total CHOL/HDL Ratio: 4.5 ratio
Triglycerides: 261 mg/dL — ABNORMAL HIGH
VLDL: 52 mg/dL — ABNORMAL HIGH

## 2015-11-20 LAB — HEPATIC FUNCTION PANEL
ALT: 16 U/L (ref 9–46)
AST: 15 U/L (ref 10–35)
Albumin: 3.8 g/dL (ref 3.6–5.1)
Alkaline Phosphatase: 85 U/L (ref 40–115)
Bilirubin, Direct: 0.1 mg/dL (ref <=0.2)
Indirect Bilirubin: 0.6 mg/dL (ref 0.2–1.2)
Total Bilirubin: 0.7 mg/dL (ref 0.2–1.2)
Total Protein: 6.1 g/dL (ref 6.1–8.1)

## 2015-11-20 NOTE — Progress Notes (Signed)
CARDIOLOGY OFFICE NOTE  Date:  11/20/2015    Kristopher Wilson Date of Birth: Aug 14, 1936 Medical Record [id number]  PCP:  Annye Asa, MD  Cardiologist:  Aundra Dubin    Chief Complaint  Patient presents with  . Coronary Artery Disease  . Hypertension  . Hyperlipidemia    4 month check - seen for Dr. Aundra Dubin    History of Present Illness: Kristopher Wilson is a 80 y.o. male who presents today for a 4 month check. Seen for Dr. Aundra Dubin.   He has a history of CAD, ulcerative colitis, and shingles with post-herpetic neuralgia. Patient had PCI in 2000 with BMS to LAD and repeat PCI in 2009 with DES to PLV. ETT-myoview in 3/16 showed no evidence for ischemia or infarction. His activity has been limited by a left leg neuropathy that has been thought to be due to post-herpetic neuralgia. He has been seen by neurology for this. He wears an orthotic on the left leg because of foot drop.He remains on aspirin and Plavix.   Last seen by Dr. [name] 05/2012.   Saw Richardson Dopp PA in December of 2015 for surgical clearance for L shoulder rotator cuff surgery with Dr. Durward Fortes 09/13/14.He was doing well.  I saw him back in March of 2016 - he was having atypical chest pain. BP was up. We updated his Myoview - this turned out ok. He was going to monitor his BP at home. When seen back in April - he was doing well. BP improved. Really likes his salt. His BP cuff was about 10 points higher than here in the office.   I saw him back in October - was having chest pain - difficult to sort out. Still getting too much salt. Since he had had a recent Myoview - I placed him on Imdur. He wanted to try and be managed medically. He was improved at his follow up in November.   Comes back today. Here alone. Switching to new PCP. To see Dr. Birdie Riddle later this month. No chest pain whatsoever - he is very happy about this. Breathing is good. Wanting "typhoid pills" since he is doing to the [place].  Fasting today. Tolerating his medicines.   Past Medical History  Diagnosis Date  . CAD (coronary artery disease)   . Hypertension   . Ulcer   . GERD (gastroesophageal reflux disease)   . Hyperthyroidism   . Hyperlipidemia   . RLS (restless legs syndrome)   . CKD (chronic kidney disease)   . CHF (congestive heart failure) ([place])   . VENOUS INSUFFICIENCY 07/28/2010  . PERIPHERAL NEUROPATHY 10/11/2007  . COLONIC POLYPS, ADENOMATOUS, HX OF   . ULCERATIVE COLITIS, LEFT SIDED 2000  . BACK PAIN, LUMBAR 10/18/2009  . URTICARIA, ALLERGIC 08/05/2009  . Benign paroxysmal positional vertigo 09/30/2007  . 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 PROSTATE CANCER, HX OF 07/07/2007    s/p prostatectomy  . Herpes zoster with other nervous system complications(053.19)   . Loss of hearing     Bil/has hearing aids in both ears  . Nerve damage of left foot     Past Surgical History  Procedure Laterality Date  . Tonsillectomy    . Angioplasty  1990's/2004    stent placement 2  . Inguinal hernia repair    . Prostatectomy    . Colonoscopy w/ biopsies and polypectomy  06/06/2009    adenomatous polyp, diverticulosis, colitis  . Rotator cuff repair Left   . Retinal laser  procedure Right 2016     Medications: Current Outpatient Prescriptions  Medication Sig Dispense Refill  . amLODipine (NORVASC) 5 MG tablet Take 1 tablet (5 mg total) by mouth daily. 30 tablet 0  . aspirin 81 MG tablet Take 81 mg by mouth daily.      Marland Kitchen atorvastatin (LIPITOR) 40 MG tablet Take 1 tablet (40 mg total) by mouth daily. 90 tablet 0  . carvedilol (COREG) 6.25 MG tablet TAKE 1 TABLET TWICE A DAY  WITH MEALS 180 tablet 0  . clopidogrel (PLAVIX) 75 MG tablet Take 1 tablet (75 mg total) by mouth daily. 30 tablet 0  . isosorbide mononitrate (IMDUR) 30 MG 24 hr tablet Take 1 tablet (30 mg total) by mouth daily. 30 tablet 6  . lidocaine (LIDODERM) 5 % Place 1 patch onto the skin daily. Remove & Discard patch within 12 hours or as  directed by MD 30 patch 1  . Mesalamine (ASACOL HD) 800 MG TBEC TAKE 2 TABLETS (1,600MG     TOTAL) 3 TIMES A DAY 150 tablet 0  . nitroGLYCERIN (NITROSTAT) 0.4 MG SL tablet DISSOLVE 1 TABLET UNDER THETONGUE EVERY 5 MINUTES AS  NEEDED 25 tablet 1  . oxyCODONE-acetaminophen (PERCOCET) 10-325 MG tablet Take 1 tablet by mouth 3 (three) times daily. 200 tablet 0  . pantoprazole (PROTONIX) 40 MG tablet Take 1 tablet (40 mg total) by mouth daily. 30 tablet 0  . pramipexole (MIRAPEX) 1 MG tablet One by mouth each bedtime for RLS 90 tablet 0   No current facility-administered medications for this visit.    Allergies: Allergies  Allergen Reactions  . Cephalexin     REACTION: hives    Social History: The patient  reports that he quit smoking about 40 years ago. His smoking use included Cigarettes. He has never used smokeless tobacco. He reports that he drinks about 4.2 oz of alcohol per week. He reports that he does not use illicit drugs.   Family History: The patient's family history includes Diabetes in his maternal uncle and maternal uncle; Heart attack in his father; Multiple sclerosis in his brother and son; Throat cancer in his brother. There is no history of Colon cancer or Stroke.   Review of Systems: Please see the history of present illness.   Otherwise, the review of systems is positive for none.   All other systems are reviewed and negative.   Physical Exam: VS:  BP 130/80 mmHg  Pulse 60  Ht 6\' 3"  (1.905 m)  Wt 207 lb 6.4 oz (94.076 kg)  BMI 25.92 kg/m2 .  BMI Body mass index is 25.92 kg/(m^2).  Wt Readings from Last 3 Encounters:  11/20/15 207 lb 6.4 oz (94.076 kg)  09/16/15 213 lb 1.6 oz (96.662 kg)  07/23/15 208 lb 12.8 oz (94.711 kg)    General: Pleasant. Well developed, well nourished and in no acute distress. Losing weight.  HEENT: Normal. Neck: Supple, no JVD, carotid bruits, or masses noted.  Cardiac: Regular rate and rhythm. No murmurs, rubs, or gallops. No edema.    Respiratory:  Lungs are clear to auscultation bilaterally with normal work of breathing.  GI: Soft and nontender.  MS: No deformity or atrophy. Gait and ROM intact. Orthotic on the left leg.  Skin: Warm and dry. Color is normal.  Neuro:  Strength and sensation are intact and no gross focal deficits noted.  Psych: Alert, appropriate and with normal affect.   LABORATORY DATA:  EKG:  EKG is not ordered today.  Lab  Results  Component Value Date   WBC 5.2 06/12/2015   HGB 13.0 06/12/2015   HCT 38.3* 06/12/2015   PLT 224 06/12/2015   GLUCOSE 106* 06/12/2015   CHOL 195 06/12/2015   TRIG 378* 06/12/2015   HDL 43 06/12/2015   LDLDIRECT 81.8 09/06/2014   LDLCALC 76 06/12/2015   ALT 32 06/12/2015   AST 20 06/12/2015   NA 138 06/12/2015   K 4.1 06/12/2015   CL 104 06/12/2015   CREATININE 1.17 06/12/2015   BUN 19 06/12/2015   CO2 24 06/12/2015   TSH 2.18 09/06/2014   PSA 0.22 09/06/2014   INR 0.9 02/29/2008   HGBA1C 5.7 10/11/2007    BNP (last 3 results) No results for input(s): BNP in the last 8760 hours.  ProBNP (last 3 results) No results for input(s): PROBNP in the last 8760 hours.   Other Studies Reviewed Today:  Myoview Impression from 11/2014 Exercise Capacity: Fair exercise capacity. BP Response: Hypertensive blood pressure response. Clinical Symptoms: No chest pain. ECG Impression: No significant ST segment change suggestive of ischemia. Comparison with Prior Nuclear Study: Previous scan is normal    Overall Impression: Normal stress nuclear study.  LV Ejection Fraction: 70%. LV Wall Motion: NL LV Function; NL Wall Motion   Dorris Carnes    Assessment/Plan: 1. Chest pain - has known CAD with remote PCI - recent Myoview was normal. He is managed medically and doing well without symptoms.  2. Coronary Artery Disease: Continue ASA, Plavix, beta blocker, statin.   3. Hypertension: BP good on his current regimen.  4. Hyperlipidemia: Continue  statin. Lab today.  Current medicines are reviewed with the patient today.  The patient does not have concerns regarding medicines other than what has been noted above.  The following changes have been made:  See above.  Labs/ tests ordered today include:    Orders Placed This Encounter  Procedures  . Basic metabolic panel  . Hepatic function panel  . Lipid panel     Disposition:   FU with Dr. Aundra Dubin in 6 months.    Patient is agreeable to this plan and will call if any problems develop in the interim.   Signed: Burtis Junes, RN, ANP-C 11/20/2015 8:46 AM  [facility] [address] [place], [place]  [address] Phone: [phone] Fax: [phone]

## 2015-11-20 NOTE — Patient Instructions (Addendum)
We will be checking the following labs today - BMET, lipids and HPF   Medication Instructions:    Continue with your current medicines.     Testing/Procedures To Be Arranged:  N/A  Follow-Up:   See Dr. Aundra Dubin in 6 months    Other Special Instructions:   Call the Health Department about your trip to the [facility]    If you need a refill on your cardiac medications before your next appointment, please call your pharmacy.   Call the [place] office at [phone] if you have any questions, problems or concerns.

## 2015-11-22 ENCOUNTER — Telehealth: Payer: Self-pay | Admitting: Nurse Practitioner

## 2015-11-22 NOTE — Telephone Encounter (Signed)
F/u  Pt returning RN phone call- labwork. Pt stated- can leave detailed message on vm. Please call back and discuss.

## 2015-11-22 NOTE — Telephone Encounter (Signed)
Pt made aware of results.

## 2015-11-25 LAB — TOXASSURE SELECT,+ANTIDEPR,UR: PDF: 0

## 2015-11-26 ENCOUNTER — Telehealth: Payer: Self-pay | Admitting: *Deleted

## 2015-11-26 NOTE — Telephone Encounter (Signed)
Urine drug screen for this encounter is consistent for prescribed medication but is positive for alcohol.  Test says hydrocodone was declared but absent but this is incorrect.  He was taking oxycodone and it is present.

## 2015-11-26 NOTE — Progress Notes (Addendum)
Urine drug screen for this encounter is consistent for prescribed medication but is positive for alcohol.  Test says hydrocodone was declared but absent but this is incorrect.  He was taking oxycodone and it is present.

## 2015-11-28 ENCOUNTER — Other Ambulatory Visit: Payer: Self-pay | Admitting: Adult Health

## 2015-11-28 NOTE — Telephone Encounter (Signed)
Patient on going cruise through [place] and parts of [place]. He needs Typhoid vaccination.   I left him a vm informing him that I sent in a typhoid vaccination. I would also advise Anti Malarial medication as well.

## 2015-11-29 ENCOUNTER — Telehealth: Payer: Self-pay | Admitting: Adult Health

## 2015-11-29 NOTE — Telephone Encounter (Signed)
Pt returned [place] call and said he does not need malaria

## 2015-11-29 NOTE — Telephone Encounter (Signed)
See below

## 2015-12-03 ENCOUNTER — Telehealth: Payer: Self-pay | Admitting: Adult Health

## 2015-12-03 NOTE — Telephone Encounter (Signed)
I have not seen any paper work for this patient regarding oral typhoid. Please advise.

## 2015-12-03 NOTE — Telephone Encounter (Signed)
I sent the typhoid vaccination in on Friday. If it isn't there, ok to send in prescription for vivotif

## 2015-12-03 NOTE — Telephone Encounter (Signed)
Vivotif called in.

## 2015-12-03 NOTE — Telephone Encounter (Signed)
Pt states he dropped off form to get oral typhoid called in, but it is not at the pharmacy. Pt states he thought [place] had ok'd this rx. Please advise  [facility] [place]

## 2015-12-05 ENCOUNTER — Ambulatory Visit: Payer: Federal, State, Local not specified - PPO | Admitting: Family Medicine

## 2015-12-10 ENCOUNTER — Encounter: Payer: Federal, State, Local not specified - PPO | Attending: Physical Medicine & Rehabilitation

## 2015-12-10 ENCOUNTER — Ambulatory Visit (HOSPITAL_BASED_OUTPATIENT_CLINIC_OR_DEPARTMENT_OTHER): Payer: Federal, State, Local not specified - PPO | Admitting: Physical Medicine & Rehabilitation

## 2015-12-10 ENCOUNTER — Encounter: Payer: Self-pay | Admitting: Physical Medicine & Rehabilitation

## 2015-12-10 VITALS — BP 137/75 | HR 100

## 2015-12-10 DIAGNOSIS — I10 Essential (primary) hypertension: Secondary | ICD-10-CM | POA: Diagnosis not present

## 2015-12-10 DIAGNOSIS — B029 Zoster without complications: Secondary | ICD-10-CM

## 2015-12-10 DIAGNOSIS — G8929 Other chronic pain: Secondary | ICD-10-CM | POA: Insufficient documentation

## 2015-12-10 DIAGNOSIS — B0229 Other postherpetic nervous system involvement: Secondary | ICD-10-CM | POA: Diagnosis present

## 2015-12-10 DIAGNOSIS — G2581 Restless legs syndrome: Secondary | ICD-10-CM | POA: Insufficient documentation

## 2015-12-10 DIAGNOSIS — M542 Cervicalgia: Secondary | ICD-10-CM | POA: Insufficient documentation

## 2015-12-10 DIAGNOSIS — Z9889 Other specified postprocedural states: Secondary | ICD-10-CM | POA: Insufficient documentation

## 2015-12-10 DIAGNOSIS — M5416 Radiculopathy, lumbar region: Secondary | ICD-10-CM | POA: Diagnosis not present

## 2015-12-10 DIAGNOSIS — E785 Hyperlipidemia, unspecified: Secondary | ICD-10-CM | POA: Insufficient documentation

## 2015-12-10 DIAGNOSIS — K219 Gastro-esophageal reflux disease without esophagitis: Secondary | ICD-10-CM | POA: Diagnosis not present

## 2015-12-10 DIAGNOSIS — Z87891 Personal history of nicotine dependence: Secondary | ICD-10-CM | POA: Insufficient documentation

## 2015-12-10 DIAGNOSIS — I2510 Atherosclerotic heart disease of native coronary artery without angina pectoris: Secondary | ICD-10-CM | POA: Insufficient documentation

## 2015-12-10 DIAGNOSIS — E0590 Thyrotoxicosis, unspecified without thyrotoxic crisis or storm: Secondary | ICD-10-CM | POA: Diagnosis not present

## 2015-12-10 DIAGNOSIS — M5417 Radiculopathy, lumbosacral region: Secondary | ICD-10-CM

## 2015-12-10 DIAGNOSIS — B028 Zoster with other complications: Secondary | ICD-10-CM

## 2015-12-10 DIAGNOSIS — N189 Chronic kidney disease, unspecified: Secondary | ICD-10-CM | POA: Diagnosis not present

## 2015-12-10 NOTE — Patient Instructions (Signed)
Please call if an earlier appt is needed

## 2015-12-10 NOTE — Progress Notes (Signed)
Subjective:    Patient ID: Kristopher Wilson, male    DOB: [birth year], 80 y.o.   MRN: [id number] 80 year old male with postherpetic radiculitis left lower extremity HPI Not sleeping well since off oxycodone.  He is having leg cramps at night.  He also is having more arm and elbow pain since stopping oxy.  He is leaving 12/21/15 for cruise to [place] and [place] Neck pain  Is overall improved. No shoulder pain. Recently saw his orthopedic surgeon. He is approximately one year post left rotator cuff repair  Patient is overall doing quite well on his Lidoderm patch at night. he remains active and works part-time He feels several aches and pains since coming off the oxycodone Pain Inventory Average Pain 5 Pain Right Now 2 My pain is intermittent, burning and dull  In the last 24 hours, has pain interfered with the following? General activity 0 Relation with others 0 Enjoyment of life 2 What TIME of day is your pain at its worst? evening Sleep (in general) Poor  Pain is worse with: standing Pain improves with: rest and medication Relief from Meds: 7  Mobility walk without assistance ability to climb steps?  yes do you drive?  yes  Function retired  Neuro/Psych bladder control problems spasms  Prior Studies Any changes since last visit?  no  Physicians involved in your care Any changes since last visit?  no   Family History  Problem Relation Age of Onset  . Heart attack Father   . Throat cancer Brother   . Multiple sclerosis Son   . Diabetes Maternal Uncle     x 2  . Colon cancer Neg Hx   . Multiple sclerosis Brother   . Diabetes Maternal Uncle   . Stroke Neg Hx    Social History   Social History  . Marital Status: Married    Spouse Name: N/A  . Number of Children: N/A  . Years of Education: N/A   Occupational History  . retired   . [employer]    Social History Main Topics  . Smoking status: Former Smoker    Types: Cigarettes    Quit  date: 09/08/1975  . Smokeless tobacco: Never Used  . Alcohol Use: 4.2 oz/week    7 Standard drinks or equivalent per week     Comment: 1-2 "high balls" daily  . Drug Use: No  . Sexual Activity: Not Asked   Other Topics Concern  . 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 All live in [place] area   Works part time at [employer]   Past Surgical History  Procedure Laterality Date  . Tonsillectomy    . Angioplasty  1990's/2004    stent placement 2  . Inguinal hernia repair    . Prostatectomy    . Colonoscopy w/ biopsies and polypectomy  06/06/2009    adenomatous polyp, diverticulosis, colitis  . Rotator cuff repair Left   . Retinal laser procedure Right 2016   Past Medical History  Diagnosis Date  . CAD (coronary artery disease)   . Hypertension   . Ulcer   . GERD (gastroesophageal reflux disease)   . Hyperthyroidism   . Hyperlipidemia   . RLS (restless legs syndrome)   . CKD (chronic kidney disease)   . CHF (congestive heart failure) ([place])   . VENOUS  INSUFFICIENCY 07/28/2010  . PERIPHERAL NEUROPATHY 10/11/2007  . COLONIC POLYPS, ADENOMATOUS, HX OF   . ULCERATIVE COLITIS, LEFT SIDED 2000  . BACK PAIN, LUMBAR 10/18/2009  . URTICARIA, ALLERGIC 08/05/2009  . Benign paroxysmal positional vertigo 09/30/2007  . 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 PROSTATE CANCER, HX OF 07/07/2007    s/p prostatectomy  . Herpes zoster with other nervous system complications(053.19)   . Loss of hearing     Bil/has hearing aids in both ears  . Nerve damage of left foot    BP 137/75 mmHg  Pulse 100  SpO2 97%  Opioid Risk Score:   Fall Risk Score:  `1  Depression screen PHQ 2/9  Depression screen [facility] 2/9 12/10/2015 11/19/2015 09/16/2015 09/16/2015 09/13/2014 11/14/2013  Decreased Interest 0 0 0 0 0 0  Down, Depressed, Hopeless 0 0 0 0 0 0  PHQ - 2 Score 0 0 0 0 0 0  Altered sleeping - 3 - - - -  Tired,  decreased energy - 1 - - - -  Change in appetite - 0 - - - -  Feeling bad or failure about yourself  - 0 - - - -  Trouble concentrating - 0 - - - -  Moving slowly or fidgety/restless - 0 - - - -  Suicidal thoughts - 0 - - - -  PHQ-9 Score - 4 - - - -  Difficult doing work/chores - Not difficult at all - - - -     Review of Systems  Constitutional: Positive for diaphoresis.  Psychiatric/Behavioral: Positive for sleep disturbance.  All other systems reviewed and are negative.      Objective:   Physical Exam  Constitutional: He is oriented to person, place, and time. He appears well-developed and well-nourished.  HENT:  Head: Normocephalic and atraumatic.  Eyes: Conjunctivae and EOM are normal. Pupils are equal, round, and reactive to light.  Musculoskeletal:       Right shoulder: He exhibits normal range of motion, no tenderness and no deformity.       Left shoulder: He exhibits normal range of motion and no tenderness.       Cervical back: He exhibits decreased range of motion. He exhibits no tenderness and no bony tenderness.       Lumbar back: He exhibits normal range of motion, no tenderness and no bony tenderness.  Neurological: He is alert and oriented to person, place, and time. He has normal strength.  Psychiatric: He has a normal mood and affect.  Nursing note and vitals reviewed.         Assessment & Plan:  1. Postherpetic neuralgia with chronic L5 radiculopathy left lower extremity. There has been no progression to his symptoms. No symptoms in the right lower extremity. No back pain. RTC 1 year We will Continue Lidoderm patch. Have discontinued oxycodone  2. Chronic intermittent neck pain I suspect he has spondylosis-His cervical spine films have been reviewed. No significant cervical degenerative disc. There is mild foraminal narrowingAt C4-5 and C5-6 And may be responsible for his left shoulder pain. At this point his neck is really not bothering him. His  symptoms are mainly right-sided neck pain. This could potentially be related to cervical facet joint and may benefit from cervical medial branch blocks for further diagnostic information  3. Diffuse lower extremity cramping, this is intermittent and not activity related. It is not severe. We discussed treatment options including muscle relaxers, gabapentin. He really does not want to start a new  medication. We discussed home remedies such as quinine water. He would like to try that

## 2016-01-16 ENCOUNTER — Ambulatory Visit (INDEPENDENT_AMBULATORY_CARE_PROVIDER_SITE_OTHER): Payer: Federal, State, Local not specified - PPO | Admitting: Adult Health

## 2016-01-16 ENCOUNTER — Encounter: Payer: Self-pay | Admitting: Adult Health

## 2016-01-16 VITALS — BP 160/70 | Temp 98.4°F | Ht 75.0 in | Wt 215.1 lb

## 2016-01-16 DIAGNOSIS — H8113 Benign paroxysmal vertigo, bilateral: Secondary | ICD-10-CM | POA: Diagnosis not present

## 2016-01-16 MED ORDER — MECLIZINE HCL 12.5 MG PO TABS: 12.5000 mg | ORAL_TABLET | Freq: Two times a day (BID) | ORAL | Status: DC | PRN

## 2016-01-16 NOTE — Progress Notes (Signed)
Subjective:    Patient ID: Kristopher Wilson, male    DOB: 01/03/1936, 80 y.o.   MRN: [id number]  HPI  80 year old male who presents to the office today for two weeks of dizziness He reports that his symptoms include: Dizziness and unsteadyness. He does not feel like the room is spinning. Happens with movement ( turning head, getting up from bed, getting out from chair). It does not happen every day. When he feels dizzy he does the Epley Maneuver at home and endorses that this helps.       Review of Systems  Constitutional: Negative.   Respiratory: Negative.   Cardiovascular: Negative.   Neurological: Positive for dizziness. Negative for syncope, speech difficulty, weakness, light-headedness, numbness and headaches.  All other systems reviewed and are negative.  Past Medical History  Diagnosis Date  . CAD (coronary artery disease)   . Hypertension   . Ulcer   . GERD (gastroesophageal reflux disease)   . Hyperthyroidism   . Hyperlipidemia   . RLS (restless legs syndrome)   . CKD (chronic kidney disease)   . CHF (congestive heart failure) ([place])   . VENOUS INSUFFICIENCY 07/28/2010  . PERIPHERAL NEUROPATHY 10/11/2007  . COLONIC POLYPS, ADENOMATOUS, HX OF   . ULCERATIVE COLITIS, LEFT SIDED 2000  . BACK PAIN, LUMBAR 10/18/2009  . URTICARIA, ALLERGIC 08/05/2009  . Benign paroxysmal positional vertigo 09/30/2007  . 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 PROSTATE CANCER, HX OF 07/07/2007    s/p prostatectomy  . Herpes zoster with other nervous system complications(053.19)   . Loss of hearing     Bil/has hearing aids in both ears  . Nerve damage of left foot     Social History   Social History  . Marital Status: Married    Spouse Name: N/A  . Number of Children: N/A  . Years of Education: N/A   Occupational History  . retired   . [employer]    Social History Main Topics  . Smoking status: Former Smoker    Types: Cigarettes    Quit date: 09/08/1975  . Smokeless  tobacco: Never Used  . Alcohol Use: 4.2 oz/week    7 Standard drinks or equivalent per week     Comment: 1-2 "high balls" daily  . Drug Use: No  . Sexual Activity: Not on file   Other Topics Concern  . 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 All live in [place] area   Works part time at [employer]    Past Surgical History  Procedure Laterality Date  . Tonsillectomy    . Angioplasty  1990's/2004    stent placement 2  . Inguinal hernia repair    . Prostatectomy    . Colonoscopy w/ biopsies and polypectomy  06/06/2009    adenomatous polyp, diverticulosis, colitis  . Rotator cuff repair Left   . Retinal laser procedure Right 2016    Family History  Problem Relation Age of Onset  . Heart attack Father   . Throat cancer Brother   . Multiple sclerosis Son   . Diabetes Maternal Uncle     x 2  . Colon cancer Neg Hx   . Multiple sclerosis Brother   . Diabetes Maternal Uncle   . Stroke Neg Hx     Allergies  Allergen Reactions  .  Cephalexin     REACTION: hives    Current Outpatient Prescriptions on File Prior to Visit  Medication Sig Dispense Refill  . amLODipine (NORVASC) 5 MG tablet Take 1 tablet (5 mg total) by mouth daily. 30 tablet 0  . aspirin 81 MG tablet Take 81 mg by mouth daily.      Marland Kitchen atorvastatin (LIPITOR) 40 MG tablet Take 1 tablet (40 mg total) by mouth daily. 90 tablet 0  . carvedilol (COREG) 6.25 MG tablet TAKE 1 TABLET TWICE A DAY  WITH MEALS 180 tablet 0  . clopidogrel (PLAVIX) 75 MG tablet Take 1 tablet (75 mg total) by mouth daily. 30 tablet 0  . isosorbide mononitrate (IMDUR) 30 MG 24 hr tablet Take 1 tablet (30 mg total) by mouth daily. 30 tablet 6  . Mesalamine (ASACOL HD) 800 MG TBEC TAKE 2 TABLETS (1,600MG     TOTAL) 3 TIMES A DAY 150 tablet 0  . nitroGLYCERIN (NITROSTAT) 0.4 MG SL tablet DISSOLVE 1 TABLET UNDER THETONGUE EVERY 5 MINUTES AS  NEEDED 25  tablet 1  . pantoprazole (PROTONIX) 40 MG tablet Take 1 tablet (40 mg total) by mouth daily. 30 tablet 0  . pramipexole (MIRAPEX) 1 MG tablet One by mouth each bedtime for RLS 90 tablet 0  . VIVOTIF DR capsule As directed    . lidocaine (LIDODERM) 5 % Place 1 patch onto the skin daily. Remove & Discard patch within 12 hours or as directed by MD (Patient not taking: Reported on 01/16/2016) 30 patch 1   No current facility-administered medications on file prior to visit.    BP 160/70 mmHg  Temp(Src) 98.4 F (36.9 C) (Oral)  Ht 6\' 3"  (1.905 m)  Wt 215 lb 1.6 oz (97.569 kg)  BMI 26.89 kg/m2       Objective:   Physical Exam  Constitutional: He is oriented to person, place, and time. He appears well-developed and well-nourished. No distress.  HENT:  Head: Normocephalic and atraumatic.  Right Ear: External ear normal.  Left Ear: External ear normal.  Nose: Nose normal.  Mouth/Throat: Oropharynx is clear and moist. No oropharyngeal exudate.  Eyes: Conjunctivae and EOM are normal. Pupils are equal, round, and reactive to light. Right eye exhibits no discharge. Left eye exhibits no discharge. No scleral icterus.  Neurological: He is alert and oriented to person, place, and time.  Horizontal nystagmus with dix/hallpike maneuver. + for dizziness when getting out of chair and going from supine to sitting.   Skin: Skin is warm and dry. No rash noted. He is not diaphoretic. No erythema. No pallor.  Psychiatric: He has a normal mood and affect. His behavior is normal. Judgment and thought content normal.  Nursing note and vitals reviewed.     Assessment & Plan:  1. Benign paroxysmal positional vertigo, bilateral - meclizine (ANTIVERT) 12.5 MG tablet; Take 1 tablet (12.5 mg total) by mouth 2 (two) times daily as needed for dizziness.  Dispense: 30 tablet; Refill: 0 - He does not want to do vestibular rehab at this time - Follow up if no improvement.   Dorothyann Peng, NP

## 2016-01-16 NOTE — Patient Instructions (Addendum)
It was great seeing you today!  I have sent in a prescription for Meclizine. You can take 1-2 tabs twice a day as needed for dizziness.   Follow up with no improvement   Benign Positional Vertigo Vertigo is the feeling that you or your surroundings are moving when they are not. Benign positional vertigo is the most common form of vertigo. The cause of this condition is not serious (is benign). This condition is triggered by certain movements and positions (is positional). This condition can be dangerous if it occurs while you are doing something that could endanger you or others, such as driving.  CAUSES In many cases, the cause of this condition is not known. It may be caused by a disturbance in an area of the inner ear that helps your brain to sense movement and balance. This disturbance can be caused by a viral infection (labyrinthitis), head injury, or repetitive motion. RISK FACTORS This condition is more likely to develop in:  Women.  People who are 108 years of age or older. SYMPTOMS Symptoms of this condition usually happen when you move your head or your eyes in different directions. Symptoms may start suddenly, and they usually last for less than a minute. Symptoms may include:  Loss of balance and falling.  Feeling like you are spinning or moving.  Feeling like your surroundings are spinning or moving.  Nausea and vomiting.  Blurred vision.  Dizziness.  Involuntary eye movement (nystagmus). Symptoms can be mild and cause only slight annoyance, or they can be severe and interfere with daily life. Episodes of benign positional vertigo may return (recur) over time, and they may be triggered by certain movements. Symptoms may improve over time. DIAGNOSIS This condition is usually diagnosed by medical history and a physical exam of the head, neck, and ears. You may be referred to a health care provider who specializes in ear, nose, and throat (ENT) problems (otolaryngologist)  or a provider who specializes in disorders of the nervous system (neurologist). You may have additional testing, including:  MRI.  A CT scan.  Eye movement tests. Your health care provider may ask you to change positions quickly while he or she watches you for symptoms of benign positional vertigo, such as nystagmus. Eye movement may be tested with an electronystagmogram (ENG), caloric stimulation, the Dix-Hallpike test, or the roll test.  An electroencephalogram (EEG). This records electrical activity in your brain.  Hearing tests. TREATMENT Usually, your health care provider will treat this by moving your head in specific positions to adjust your inner ear back to normal. Surgery may be needed in severe cases, but this is rare. In some cases, benign positional vertigo may resolve on its own in 2-4 weeks. HOME CARE INSTRUCTIONS Safety  Move slowly.Avoid sudden body or head movements.  Avoid driving.  Avoid operating heavy machinery.  Avoid doing any tasks that would be dangerous to you or others if a vertigo episode would occur.  If you have trouble walking or keeping your balance, try using a cane for stability. If you feel dizzy or unstable, sit down right away.  Return to your normal activities as told by your health care provider. Ask your health care provider what activities are safe for you. General Instructions  Take over-the-counter and prescription medicines only as told by your health care provider.  Avoid certain positions or movements as told by your health care provider.  Drink enough fluid to keep your urine clear or pale yellow.  Keep all  follow-up visits as told by your health care provider. This is important. SEEK MEDICAL CARE IF:  You have a fever.  Your condition gets worse or you develop new symptoms.  Your family or friends notice any behavioral changes.  Your nausea or vomiting gets worse.  You have numbness or a "pins and needles"  sensation. SEEK IMMEDIATE MEDICAL CARE IF:  You have difficulty speaking or moving.  You are always dizzy.  You faint.  You develop severe headaches.  You have weakness in your legs or arms.  You have changes in your hearing or vision.  You develop a stiff neck.  You develop sensitivity to light.   This information is not intended to replace advice given to you by your health care provider. Make sure you discuss any questions you have with your health care provider.   Document Released: 06/01/2006 Document Revised: 05/15/2015 Document Reviewed: 12/17/2014 Elsevier Interactive Patient Education 2016 Elsevier Inc. Benign Positional Vertigo Vertigo is the feeling that you or your surroundings are moving when they are not. Benign positional vertigo is the most common form of vertigo. The cause of this condition is not serious (is benign). This condition is triggered by certain movements and positions (is positional). This condition can be dangerous if it occurs while you are doing something that could endanger you or others, such as driving.  CAUSES In many cases, the cause of this condition is not known. It may be caused by a disturbance in an area of the inner ear that helps your brain to sense movement and balance. This disturbance can be caused by a viral infection (labyrinthitis), head injury, or repetitive motion. RISK FACTORS This condition is more likely to develop in:  Women.  People who are 25 years of age or older. SYMPTOMS Symptoms of this condition usually happen when you move your head or your eyes in different directions. Symptoms may start suddenly, and they usually last for less than a minute. Symptoms may include:  Loss of balance and falling.  Feeling like you are spinning or moving.  Feeling like your surroundings are spinning or moving.  Nausea and vomiting.  Blurred vision.  Dizziness.  Involuntary eye movement (nystagmus). Symptoms can be mild and  cause only slight annoyance, or they can be severe and interfere with daily life. Episodes of benign positional vertigo may return (recur) over time, and they may be triggered by certain movements. Symptoms may improve over time. DIAGNOSIS This condition is usually diagnosed by medical history and a physical exam of the head, neck, and ears. You may be referred to a health care provider who specializes in ear, nose, and throat (ENT) problems (otolaryngologist) or a provider who specializes in disorders of the nervous system (neurologist). You may have additional testing, including:  MRI.  A CT scan.  Eye movement tests. Your health care provider may ask you to change positions quickly while he or she watches you for symptoms of benign positional vertigo, such as nystagmus. Eye movement may be tested with an electronystagmogram (ENG), caloric stimulation, the Dix-Hallpike test, or the roll test.  An electroencephalogram (EEG). This records electrical activity in your brain.  Hearing tests. TREATMENT Usually, your health care provider will treat this by moving your head in specific positions to adjust your inner ear back to normal. Surgery may be needed in severe cases, but this is rare. In some cases, benign positional vertigo may resolve on its own in 2-4 weeks. HOME CARE INSTRUCTIONS Safety  Move slowly.Avoid  sudden body or head movements.  Avoid driving.  Avoid operating heavy machinery.  Avoid doing any tasks that would be dangerous to you or others if a vertigo episode would occur.  If you have trouble walking or keeping your balance, try using a cane for stability. If you feel dizzy or unstable, sit down right away.  Return to your normal activities as told by your health care provider. Ask your health care provider what activities are safe for you. General Instructions  Take over-the-counter and prescription medicines only as told by your health care provider.  Avoid certain  positions or movements as told by your health care provider.  Drink enough fluid to keep your urine clear or pale yellow.  Keep all follow-up visits as told by your health care provider. This is important. SEEK MEDICAL CARE IF:  You have a fever.  Your condition gets worse or you develop new symptoms.  Your family or friends notice any behavioral changes.  Your nausea or vomiting gets worse.  You have numbness or a "pins and needles" sensation. SEEK IMMEDIATE MEDICAL CARE IF:  You have difficulty speaking or moving.  You are always dizzy.  You faint.  You develop severe headaches.  You have weakness in your legs or arms.  You have changes in your hearing or vision.  You develop a stiff neck.  You develop sensitivity to light.   This information is not intended to replace advice given to you by your health care provider. Make sure you discuss any questions you have with your health care provider.   Document Released: 06/01/2006 Document Revised: 05/15/2015 Document Reviewed: 12/17/2014 Elsevier Interactive Patient Education [employer].

## 2016-01-21 ENCOUNTER — Other Ambulatory Visit: Payer: Self-pay | Admitting: Family Medicine

## 2016-01-21 ENCOUNTER — Other Ambulatory Visit: Payer: Self-pay | Admitting: Adult Health

## 2016-01-21 ENCOUNTER — Other Ambulatory Visit: Payer: Self-pay | Admitting: *Deleted

## 2016-01-21 DIAGNOSIS — I25810 Atherosclerosis of coronary artery bypass graft(s) without angina pectoris: Secondary | ICD-10-CM

## 2016-01-21 MED ORDER — CLOPIDOGREL BISULFATE 75 MG PO TABS
75.0000 mg | ORAL_TABLET | Freq: Every day | ORAL | Status: DC
Start: 2016-01-21 — End: 2017-04-05

## 2016-01-21 NOTE — Telephone Encounter (Signed)
Okay to refill? 

## 2016-01-21 NOTE — Telephone Encounter (Signed)
Ok to refill. 90 pills with 3 refills

## 2016-01-22 ENCOUNTER — Telehealth: Payer: Self-pay | Admitting: Adult Health

## 2016-01-22 NOTE — Telephone Encounter (Signed)
Ok to refill for 6 months 

## 2016-01-22 NOTE — Telephone Encounter (Signed)
Pt request refill of the following:  Mesalamine (ASACOL HD) 800 MG TBEC,     Phamacy:  [facility]

## 2016-01-22 NOTE — Telephone Encounter (Signed)
Ok to refill 

## 2016-01-23 ENCOUNTER — Other Ambulatory Visit: Payer: Self-pay

## 2016-01-23 DIAGNOSIS — H8113 Benign paroxysmal vertigo, bilateral: Secondary | ICD-10-CM

## 2016-01-23 MED ORDER — MESALAMINE 800 MG PO TBEC: DELAYED_RELEASE_TABLET | ORAL | Status: DC

## 2016-01-23 NOTE — Telephone Encounter (Signed)
Rx refilled.

## 2016-01-28 ENCOUNTER — Telehealth: Payer: Self-pay | Admitting: Family Medicine

## 2016-01-28 NOTE — Telephone Encounter (Signed)
Prior Authorization submitted by fax on 01/28/16 for Protonix 40 mg tab to take 1 po qd.

## 2016-01-30 ENCOUNTER — Other Ambulatory Visit (INDEPENDENT_AMBULATORY_CARE_PROVIDER_SITE_OTHER): Payer: Federal, State, Local not specified - PPO

## 2016-01-30 DIAGNOSIS — Z0000 Encounter for general adult medical examination without abnormal findings: Secondary | ICD-10-CM | POA: Diagnosis not present

## 2016-01-30 LAB — POC URINALSYSI DIPSTICK (AUTOMATED)
Bilirubin, UA: NEGATIVE
Blood, UA: NEGATIVE
Glucose, UA: NEGATIVE
Ketones, UA: NEGATIVE
LEUKOCYTES UA: NEGATIVE
NITRITE UA: NEGATIVE
SPEC GRAV UA: 1.025
UROBILINOGEN UA: 0.2
pH, UA: 5.5

## 2016-01-30 LAB — BASIC METABOLIC PANEL
BUN: 20 mg/dL (ref 6–23)
CHLORIDE: 104 meq/L (ref 96–112)
CO2: 27 mEq/L (ref 19–32)
Calcium: 9.2 mg/dL (ref 8.4–10.5)
Creatinine, Ser: 1.07 mg/dL (ref 0.40–1.50)
GFR: 71.1 mL/min (ref >60.00)
Glucose, Bld: 98 mg/dL (ref 70–99)
POTASSIUM: 4.2 meq/L (ref 3.5–5.1)
SODIUM: 139 meq/L (ref 135–145)

## 2016-01-30 LAB — LIPID PANEL
CHOLESTEROL: 162 mg/dL (ref 0–200)
HDL: 42.5 mg/dL (ref >39.00)
NonHDL: 119.81
TRIGLYCERIDES: 221 mg/dL — AB (ref 0.0–149.0)
Total CHOL/HDL Ratio: 4
VLDL: 44.2 mg/dL — AB (ref 0.0–40.0)

## 2016-01-30 LAB — CBC WITH DIFFERENTIAL/PLATELET
BASOS PCT: 0.5 % (ref 0.0–3.0)
Basophils Absolute: 0 10*3/uL (ref 0.0–0.1)
EOS PCT: 3.5 % (ref 0.0–5.0)
Eosinophils Absolute: 0.2 10*3/uL (ref 0.0–0.7)
HEMATOCRIT: 40 % (ref 39.0–52.0)
HEMOGLOBIN: 13.4 g/dL (ref 13.0–17.0)
LYMPHS PCT: 31.7 % (ref 12.0–46.0)
Lymphs Abs: 2 10*3/uL (ref 0.7–4.0)
MCHC: 33.5 g/dL (ref 30.0–36.0)
MCV: 87.8 fl (ref 78.0–100.0)
MONO ABS: 0.4 10*3/uL (ref 0.1–1.0)
MONOS PCT: 6.9 % (ref 3.0–12.0)
Neutro Abs: 3.7 10*3/uL (ref 1.4–7.7)
Neutrophils Relative %: 57.4 % (ref 43.0–77.0)
Platelets: 244 10*3/uL (ref 150.0–400.0)
RBC: 4.55 Mil/uL (ref 4.22–5.81)
RDW: 14.5 % (ref 11.5–15.5)
WBC: 6.4 10*3/uL (ref 4.0–10.5)

## 2016-01-30 LAB — HEPATIC FUNCTION PANEL
ALK PHOS: 79 U/L (ref 39–117)
ALT: 21 U/L (ref 0–53)
AST: 17 U/L (ref 0–37)
Albumin: 4.5 g/dL (ref 3.5–5.2)
BILIRUBIN DIRECT: 0.1 mg/dL (ref 0.0–0.3)
BILIRUBIN TOTAL: 0.9 mg/dL (ref 0.2–1.2)
Total Protein: 6.2 g/dL (ref 6.0–8.3)

## 2016-01-30 LAB — PSA: PSA: 0.21 ng/mL (ref 0.10–4.00)

## 2016-01-30 LAB — LDL CHOLESTEROL, DIRECT: Direct LDL: 68 mg/dL

## 2016-01-30 LAB — TSH: TSH: 2.17 u[IU]/mL (ref 0.35–4.50)

## 2016-02-05 NOTE — Telephone Encounter (Signed)
Prior Auth approved effective: 11/29/2015 through 01/27/2017.

## 2016-02-06 ENCOUNTER — Encounter: Payer: Self-pay | Admitting: Adult Health

## 2016-02-06 ENCOUNTER — Ambulatory Visit (INDEPENDENT_AMBULATORY_CARE_PROVIDER_SITE_OTHER): Payer: Federal, State, Local not specified - PPO | Admitting: Adult Health

## 2016-02-06 VITALS — BP 160/80 | Temp 97.6°F | Ht 75.0 in | Wt 217.5 lb

## 2016-02-06 DIAGNOSIS — I10 Essential (primary) hypertension: Secondary | ICD-10-CM

## 2016-02-06 DIAGNOSIS — R252 Cramp and spasm: Secondary | ICD-10-CM | POA: Diagnosis not present

## 2016-02-06 DIAGNOSIS — E785 Hyperlipidemia, unspecified: Secondary | ICD-10-CM

## 2016-02-06 DIAGNOSIS — Z0000 Encounter for general adult medical examination without abnormal findings: Secondary | ICD-10-CM | POA: Insufficient documentation

## 2016-02-06 MED ORDER — AMLODIPINE BESYLATE 10 MG PO TABS: 10.0000 mg | ORAL_TABLET | Freq: Every day | ORAL | Status: DC

## 2016-02-06 NOTE — Progress Notes (Signed)
Subjective:    Patient ID: Kristopher Wilson, male    DOB: April 20, 1936, 80 y.o.   MRN: [id number]  HPI  Patient presents for yearly preventative medicine examination. 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   All immunizations and health maintenance protocols were reviewed with the patient and needed orders were placed.  Medication reconciliation,  past medical history, social history, problem list and allergies were reviewed in detail with the patient  Goals were established with regard to weight loss, exercise, and  diet in compliance with medications  End of life planning was discussed- he has an advanced directive and living will.  He has seen cardiology, urology and dermatology this year and is to follow up with them as directed.   His only acute complaint today is that of continued muscle cramps at night. He reports that he is having them more often and are becoming more severe. This has been an issue for " years"   Review of Systems  Constitutional: Negative.   HENT: Negative.   Eyes: Negative.   Respiratory: Negative.   Cardiovascular: Negative.   Gastrointestinal: Negative.   Endocrine: Negative.   Genitourinary: Negative.   Musculoskeletal: Negative.   Skin: Negative.   Allergic/Immunologic: Negative.    Neurological: Negative.   Hematological: Negative.   Psychiatric/Behavioral: Negative.   All other systems reviewed and are negative.  Past Medical History  Diagnosis Date  . CAD (coronary artery disease)   . Hypertension   . Ulcer   . GERD (gastroesophageal reflux disease)   . Hyperthyroidism   . Hyperlipidemia   . RLS (restless legs syndrome)   . CKD (chronic kidney disease)   . CHF (congestive heart failure) ([place])   . VENOUS INSUFFICIENCY 07/28/2010  . PERIPHERAL NEUROPATHY 10/11/2007  . COLONIC POLYPS, ADENOMATOUS, HX OF   . ULCERATIVE COLITIS, LEFT SIDED 2000  . BACK PAIN, LUMBAR 10/18/2009  . URTICARIA, ALLERGIC 08/05/2009  . Benign paroxysmal positional vertigo 09/30/2007  . 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 PROSTATE CANCER, HX OF 07/07/2007    s/p prostatectomy  . Herpes zoster with other nervous system complications(053.19)   . Loss of hearing     Bil/has hearing aids in both ears  . Nerve damage of left foot     Social History   Social History  . Marital Status: Married    Spouse Name: N/A  . Number of Children: N/A  . Years of Education: N/A   Occupational History  . retired   . [employer]    Social History Main Topics  . Smoking status: Former Smoker    Types: Cigarettes    Quit date: 09/08/1975  . Smokeless tobacco: Never Used  . Alcohol Use: 4.2 oz/week    7 Standard drinks or equivalent per week     Comment: 1-2 "high balls" daily  . Drug Use: No  . Sexual Activity: Not on file   Other Topics Concern  . 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 All live in [place] area   Works part time at [employer]    Past Surgical History  Procedure Laterality Date  . Tonsillectomy    . Angioplasty  1990's/2004    stent placement 2  . Inguinal hernia repair    . Prostatectomy    . Colonoscopy w/ biopsies and polypectomy  06/06/2009     adenomatous polyp, diverticulosis, colitis  . Rotator cuff repair Left   . Retinal laser procedure Right 2016    Family History  Problem Relation Age of Onset  . Heart attack Father   . Throat cancer Brother   . Multiple sclerosis Son   . Diabetes Maternal Uncle     x 2  . Colon cancer Neg Hx   . Multiple sclerosis Brother   . Diabetes Maternal Uncle   . Stroke Neg Hx     Allergies  Allergen Reactions  . Cephalexin     REACTION: hives    Current Outpatient Prescriptions on File Prior to Visit  Medication Sig Dispense Refill  . amLODipine (NORVASC) 5 MG tablet TAKE 1 TABLET DAILY 90 tablet 1  . aspirin 81 MG tablet Take 81 mg by mouth daily.      Marland Kitchen atorvastatin (LIPITOR) 40 MG tablet Take 1 tablet (40 mg total) by mouth daily. 90 tablet 0  . carvedilol (COREG) 6.25 MG tablet TAKE 1 TABLET TWICE A DAY  WITH MEALS 180 tablet 0  . clopidogrel (PLAVIX) 75 MG tablet Take 1 tablet (75 mg total) by mouth daily. 90 tablet 3  . isosorbide mononitrate (IMDUR) 30 MG 24 hr tablet Take 1 tablet (30 mg total) by mouth daily. 30 tablet 6  . meclizine (ANTIVERT) 12.5 MG tablet Take 1 tablet (12.5 mg total) by mouth 2 (two) times daily as needed for dizziness. 30 tablet 0  . Mesalamine (ASACOL HD) 800 MG TBEC TAKE 2 TABLETS (1,600MG     TOTAL) 3 TIMES A DAY 150 tablet 0  . Mesalamine 800 MG TBEC TAKE 2 TABLETS (=1,600MG ) 3TIMES A DAY 540 tablet 1  . nitroGLYCERIN (NITROSTAT) 0.4 MG SL tablet DISSOLVE 1 TABLET UNDER THETONGUE EVERY 5 MINUTES AS  NEEDED 25 tablet 1  . pantoprazole (PROTONIX) 40 MG tablet Take 1 tablet (40 mg total) by mouth daily. 30 tablet 0  . pramipexole (MIRAPEX) 1 MG tablet TAKE 1 TABLET AT BEDTIME   FOR RESTLESS LEG SYNDROME  PLEASE MAKE AN APPOINTMENT WITH YOUR DOCTOR 90 tablet 0  . VIVOTIF DR capsule As directed     No current facility-administered medications on file prior to visit.    BP 160/80 mmHg  Temp(Src) 97.6 F (36.4 C) (Oral)  Ht 6\' 3"  (1.905 m)  Wt 217 lb  8 oz (98.657 kg)  BMI 27.19 kg/m2      Objective:   Physical Exam  Constitutional: He is oriented to person, place, and time. He appears well-developed and well-nourished. No distress.  HENT:  Head: Normocephalic and atraumatic.  Right Ear: External ear normal.  Left Ear: External ear normal.  Nose: Nose normal.  Mouth/Throat: Oropharynx is clear and moist. No oropharyngeal exudate.  Eyes: Conjunctivae and EOM are normal. Pupils are equal, round, and reactive to light. Right eye exhibits no discharge. Left eye exhibits no discharge. No scleral icterus.  Neck: Normal range of motion. Neck supple. No JVD present. No tracheal  deviation present. No thyromegaly present.  Cardiovascular: Normal rate, regular rhythm, normal heart sounds and intact distal pulses.  Exam reveals no gallop.   No murmur heard. Pulmonary/Chest: Effort normal and breath sounds normal. No respiratory distress. He has no wheezes. He has no rales. He exhibits no tenderness.  Abdominal: Soft. Bowel sounds are normal. He exhibits no distension and no mass. There is no tenderness. There is no rebound and no guarding.  Genitourinary:  Was seen by Urology yesterday   Musculoskeletal: Normal range of motion. He exhibits no edema or tenderness.  Lymphadenopathy:    He has no cervical adenopathy.  Neurological: He is alert and oriented to person, place, and time. He has normal reflexes. He displays normal reflexes. He exhibits normal muscle tone. Coordination normal.  Skin: Skin is warm and dry. No rash noted. He is not diaphoretic. No erythema. No pallor.  Psychiatric: He has a normal mood and affect. His behavior is normal. Judgment and thought content normal.  Nursing note and vitals reviewed.      Assessment & Plan:  1. Routine general medical examination at a health care facility - reviewed labs in detail with the patient  And all questions answered - Follow up in one year or sooner if needed - Continue to eat  healthy and exercise as much as possible.  - Monitor BP at home  2. Essential hypertension - Elevated today in the office as it was the last time he was here BP Readings from Last 3 Encounters:  02/06/16 160/80  01/16/16 160/70  12/10/15 137/75  - Will increase amlodipine from 5 mg to 10mg  - Monitor BP at home and follow up in one month  - Continue to exercise and eat healthy   3. Hyperlipidemia - Total Cholesterol WNL. Triglycerides elevated but are trending down   4. Muscle cramps - Advised to trial Lipitor every other day  - He can also increase water intake, stretch before bed, or have a couple table spoons of yellow mustard before bed.   Dorothyann Peng, NP

## 2016-02-11 ENCOUNTER — Other Ambulatory Visit: Payer: Self-pay | Admitting: Nurse Practitioner

## 2016-02-27 ENCOUNTER — Other Ambulatory Visit: Payer: Self-pay | Admitting: Nurse Practitioner

## 2016-03-20 ENCOUNTER — Other Ambulatory Visit: Payer: Self-pay | Admitting: Adult Health

## 2016-03-26 ENCOUNTER — Encounter: Payer: Self-pay | Admitting: Adult Health

## 2016-03-26 ENCOUNTER — Ambulatory Visit (INDEPENDENT_AMBULATORY_CARE_PROVIDER_SITE_OTHER): Payer: Federal, State, Local not specified - PPO | Admitting: Adult Health

## 2016-03-26 VITALS — BP 150/70 | Temp 98.1°F | Ht 75.0 in | Wt 219.8 lb

## 2016-03-26 DIAGNOSIS — I10 Essential (primary) hypertension: Secondary | ICD-10-CM | POA: Diagnosis not present

## 2016-03-26 DIAGNOSIS — E785 Hyperlipidemia, unspecified: Secondary | ICD-10-CM

## 2016-03-26 DIAGNOSIS — Z760 Encounter for issue of repeat prescription: Secondary | ICD-10-CM

## 2016-03-26 MED ORDER — CARVEDILOL 12.5 MG PO TABS: ORAL_TABLET | ORAL | Status: DC

## 2016-03-26 MED ORDER — ATORVASTATIN CALCIUM 40 MG PO TABS
ORAL_TABLET | ORAL | Status: DC
Start: 2016-03-26 — End: 2017-02-24

## 2016-03-26 MED ORDER — PRAMIPEXOLE DIHYDROCHLORIDE 1 MG PO TABS
ORAL_TABLET | ORAL | Status: DC
Start: 2016-03-26 — End: 2017-02-24

## 2016-03-26 NOTE — Progress Notes (Signed)
Subjective:    Patient ID: Kristopher Wilson, male    DOB: 11/05/1935, 80 y.o.   MRN: [id number]  HPI  80 year old male who presents to the office today for one month follow up regarding blood pressure. The last time he was here his blood pressure was 160/80, since it had been in this range a month earlier I increased his Amlodipine from 5 mg to 10 mg.   He has run out of his Coreg for the last two days.   Overall he feels " fine"  He denies any headaches or blurred vision.   Review of Systems  Constitutional: Negative.   Respiratory: Negative.   Cardiovascular: Negative.   Neurological: Negative.   All other systems reviewed and are negative.  Past Medical History  Diagnosis Date  . CAD (coronary artery disease)   . Hypertension   . Ulcer   . GERD (gastroesophageal reflux disease)   . Hyperthyroidism   . Hyperlipidemia   . RLS (restless legs syndrome)   . CKD (chronic kidney disease)   . CHF (congestive heart failure) ([place])   . VENOUS INSUFFICIENCY 07/28/2010  . PERIPHERAL NEUROPATHY 10/11/2007  . COLONIC POLYPS, ADENOMATOUS, HX OF   . ULCERATIVE COLITIS, LEFT SIDED 2000  . BACK PAIN, LUMBAR 10/18/2009  . URTICARIA, ALLERGIC 08/05/2009  . Benign paroxysmal positional vertigo 09/30/2007  . 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 PROSTATE CANCER, HX OF 07/07/2007    s/p prostatectomy  . Herpes zoster with other nervous system complications(053.19)   . Loss of hearing     Bil/has hearing aids in both ears  . Nerve damage of left foot     Social History   Social History  . Marital Status: Married    Spouse Name: N/A  . Number of Children: N/A  . Years of Education: N/A   Occupational History  . retired   . [employer]    Social History Main Topics  . Smoking status: Former Smoker    Types: Cigarettes    Quit date: 09/08/1975  . Smokeless tobacco: Never Used  . Alcohol Use: 4.2 oz/week    7 Standard drinks or equivalent per week     Comment: 1-2  "high balls" daily  . Drug Use: No  . Sexual Activity: Not on file   Other Topics Concern  . 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 All live in [place] area   Works part time at [employer]    Past Surgical History  Procedure Laterality Date  . Tonsillectomy    . Angioplasty  1990's/2004    stent placement 2  . Inguinal hernia repair    . Prostatectomy    . Colonoscopy w/ biopsies and polypectomy  06/06/2009    adenomatous polyp, diverticulosis, colitis  . Rotator cuff repair Left   . Retinal laser procedure Right 2016 & 2017     Family History  Problem Relation Age of Onset  . Heart attack Father   . Throat cancer Brother   . Multiple sclerosis Son   . Diabetes Maternal Uncle     x 2  . Colon cancer Neg Hx   . Multiple sclerosis Brother   . Diabetes Maternal Uncle   . Stroke Neg Hx     Allergies  Allergen Reactions  . Cephalexin  REACTION: hives    Current Outpatient Prescriptions on File Prior to Visit  Medication Sig Dispense Refill  . amLODipine (NORVASC) 10 MG tablet Take 1 tablet (10 mg total) by mouth daily. 90 tablet 3  . aspirin 81 MG tablet Take 81 mg by mouth daily.      . clopidogrel (PLAVIX) 75 MG tablet Take 1 tablet (75 mg total) by mouth daily. 90 tablet 3  . isosorbide mononitrate (IMDUR) 30 MG 24 hr tablet TAKE 1 TABLET (30 MG TOTAL) BY MOUTH DAILY. 30 tablet 6  . meclizine (ANTIVERT) 12.5 MG tablet Take 1 tablet (12.5 mg total) by mouth 2 (two) times daily as needed for dizziness. 30 tablet 0  . Mesalamine (ASACOL HD) 800 MG TBEC TAKE 2 TABLETS (1,600MG     TOTAL) 3 TIMES A DAY 150 tablet 0  . Mesalamine 800 MG TBEC TAKE 2 TABLETS (=1,600MG ) 3TIMES A DAY 540 tablet 1  . nitroGLYCERIN (NITROSTAT) 0.4 MG SL tablet DISSOLVE 1 TABLET UNDER THETONGUE EVERY 5 MINUTES AS  NEEDED 25 tablet 1  . pantoprazole (PROTONIX) 40 MG tablet Take 1 tablet (40 mg  total) by mouth daily. 30 tablet 0  . VIVOTIF DR capsule As directed     No current facility-administered medications on file prior to visit.    BP 170/90 mmHg  Temp(Src) 98.1 F (36.7 C) (Oral)  Ht 6\' 3"  (1.905 m)  Wt 219 lb 12.8 oz (99.701 kg)  BMI 27.47 kg/m2       Objective:   Physical Exam  Constitutional: He is oriented to person, place, and time. He appears well-developed and well-nourished. No distress.  Cardiovascular: Normal rate, regular rhythm, normal heart sounds and intact distal pulses.  Exam reveals no gallop and no friction rub.   No murmur heard. Pulmonary/Chest: Effort normal and breath sounds normal. No respiratory distress. He has no wheezes. He has no rales. He exhibits no tenderness.  Musculoskeletal: Normal range of motion. He exhibits no edema or tenderness.  Neurological: He is alert and oriented to person, place, and time.  Skin: Skin is warm and dry. No rash noted. He is not diaphoretic. No erythema. No pallor.  Psychiatric: He has a normal mood and affect. His behavior is normal. Thought content normal.  Nursing note and vitals reviewed.     Assessment & Plan:  1. Essential hypertension - Increased Coreg from 6.25 to 12.5 mg - carvedilol (COREG) 12.5 MG tablet; TAKE 1 TABLET TWICE A DAY  WITH MEALS  Dispense: 30 tablet; Refill: 0 - Follow up in one month  - Monitor blood pressure at home  2. Hyperlipidemia - atorvastatin (LIPITOR) 40 MG tablet; Take 1 tablet daily  Dispense: 90 tablet; Refill: 3  3. Medication refill - atorvastatin (LIPITOR) 40 MG tablet; Take 1 tablet daily  Dispense: 90 tablet; Refill: 3 - pramipexole (MIRAPEX) 1 MG tablet; Take 1 tablet at bedtime for Restless Leg Syndrome.  Dispense: 90 tablet; Refill: 3 - carvedilol (COREG) 12.5 MG tablet; TAKE 1 TABLET TWICE A DAY  WITH MEALS  Dispense: 30 tablet; Refill: 0  Dorothyann Peng, NP

## 2016-04-21 ENCOUNTER — Encounter: Payer: Self-pay | Admitting: Adult Health

## 2016-04-21 ENCOUNTER — Ambulatory Visit (INDEPENDENT_AMBULATORY_CARE_PROVIDER_SITE_OTHER): Payer: Federal, State, Local not specified - PPO | Admitting: Adult Health

## 2016-04-21 ENCOUNTER — Other Ambulatory Visit: Payer: Self-pay

## 2016-04-21 VITALS — BP 142/70 | Temp 97.7°F | Wt 223.2 lb

## 2016-04-21 DIAGNOSIS — R600 Localized edema: Secondary | ICD-10-CM

## 2016-04-21 MED ORDER — FUROSEMIDE 20 MG PO TABS: 20.0000 mg | ORAL_TABLET | Freq: Every day | ORAL | 3 refills | Status: DC

## 2016-04-21 NOTE — Patient Instructions (Signed)
It was great seeing you today   I will follow up with you regarding your blood work   I have also sent in a prescription for Lasix 20 mg. Take one pill daily.   I will see you next week

## 2016-04-21 NOTE — Progress Notes (Signed)
   Subjective:    Patient ID: Kristopher Wilson, male    DOB: 09-Oct-1935, 80 y.o.   MRN: [id number]  HPI   80 year old male who presents to the office today with one week of bilateral lower extremity swelling. He reports that he has never had his legs swell this much. Denies any pain in calfs, no redness or warmth.   He has been using compression socks and elevating his feet at night. This works to elevate the swelling. " By the time I wake up in the morning my legs are back to normal, but by the time I make it to the kitchen the swelling has returned.   He does have pain in bilateral ankles.  Has not changed his diet and eats low sodium diet already.   He had a stress done in march 2016 which showed normal function and no ischemia.   He denies any SOB with rest. Does have some SOB when walking stairs but does not have any SOB with normal ADLs. Denies any CP.   Review of Systems  Constitutional: Negative.   Respiratory: Negative.   Cardiovascular: Positive for leg swelling.  Genitourinary: Negative.   Musculoskeletal: Negative.   Skin: Negative.   Neurological: Negative.   All other systems reviewed and are negative.      Objective:   Physical Exam  Constitutional: He is oriented to person, place, and time. He appears well-developed and well-nourished. No distress.  Cardiovascular: Normal rate, regular rhythm, normal heart sounds and intact distal pulses.  Exam reveals no gallop and no friction rub.   No murmur heard. Pulmonary/Chest: Effort normal and breath sounds normal. No respiratory distress. He has no wheezes. He has no rales. He exhibits no tenderness.  Musculoskeletal: Normal range of motion. He exhibits edema and tenderness. He exhibits no deformity.  Significant bilateral lower extremity edema, non pitting. No redness, warmth or tenderness noted.  Good pedal pulses.    Neurological: He is alert and oriented to person, place, and time. He has normal reflexes.  Skin:  Skin is warm and dry. No rash noted. He is not diaphoretic. No erythema. No pallor.  Psychiatric: He has a normal mood and affect. His behavior is normal. Judgment and thought content normal.  Nursing note and vitals reviewed.     Assessment & Plan:  1. Bilateral edema of lower extremity Heart Failure vs cirrhosis vs renal sodium retention. No concern for DVT. Doubt kidney failure or pulmonary edema.  - Basic metabolic panel - Brain Natriuretic Peptide - CBC with Differential/Platelet - Hep C Antibody - Lasix 20mg  Daily  - Consider Echo and/or follow up with cardiology  - Follow up in one week   Dorothyann Peng, NP

## 2016-04-22 LAB — CBC WITH DIFFERENTIAL/PLATELET
BASOS ABS: 0 10*3/uL (ref 0.0–0.1)
Basophils Relative: 0.5 % (ref 0.0–3.0)
Eosinophils Absolute: 0.2 10*3/uL (ref 0.0–0.7)
Eosinophils Relative: 3.7 % (ref 0.0–5.0)
HCT: 37.8 % — ABNORMAL LOW (ref 39.0–52.0)
Hemoglobin: 13 g/dL (ref 13.0–17.0)
LYMPHS ABS: 2 10*3/uL (ref 0.7–4.0)
Lymphocytes Relative: 32.5 % (ref 12.0–46.0)
MCHC: 34.5 g/dL (ref 30.0–36.0)
MCV: 85.8 fl (ref 78.0–100.0)
MONO ABS: 0.5 10*3/uL (ref 0.1–1.0)
Monocytes Relative: 8.4 % (ref 3.0–12.0)
NEUTROS PCT: 54.9 % (ref 43.0–77.0)
Neutro Abs: 3.4 10*3/uL (ref 1.4–7.7)
PLATELETS: 253 10*3/uL (ref 150.0–400.0)
RBC: 4.41 Mil/uL (ref 4.22–5.81)
RDW: 13.8 % (ref 11.5–15.5)
WBC: 6.2 10*3/uL (ref 4.0–10.5)

## 2016-04-22 LAB — BASIC METABOLIC PANEL
BUN: 16 mg/dL (ref 6–23)
CALCIUM: 9.4 mg/dL (ref 8.4–10.5)
CO2: 26 mEq/L (ref 19–32)
CREATININE: 1.26 mg/dL (ref 0.40–1.50)
Chloride: 104 mEq/L (ref 96–112)
GFR: 58.84 mL/min — ABNORMAL LOW (ref >60.00)
GLUCOSE: 101 mg/dL — AB (ref 70–99)
POTASSIUM: 3.8 meq/L (ref 3.5–5.1)
SODIUM: 137 meq/L (ref 135–145)

## 2016-04-22 LAB — BRAIN NATRIURETIC PEPTIDE: Pro B Natriuretic peptide (BNP): 28 pg/mL (ref 0.0–100.0)

## 2016-04-22 LAB — HEPATITIS C ANTIBODY: HCV AB: NEGATIVE

## 2016-04-28 ENCOUNTER — Encounter: Payer: Self-pay | Admitting: Adult Health

## 2016-04-28 ENCOUNTER — Ambulatory Visit (INDEPENDENT_AMBULATORY_CARE_PROVIDER_SITE_OTHER): Payer: Federal, State, Local not specified - PPO | Admitting: Adult Health

## 2016-04-28 VITALS — BP 128/72 | Temp 98.6°F | Ht 75.0 in | Wt 222.6 lb

## 2016-04-28 DIAGNOSIS — R600 Localized edema: Secondary | ICD-10-CM

## 2016-04-28 NOTE — Progress Notes (Signed)
   Subjective:    Patient ID: Kristopher Wilson, male    DOB: [birth year], 80 y.o.   MRN: [id number]  HPI  80 year old male who presents for one week follow up for lower extremity edema. He was started on Lasix 20mg  daily.   Today in the office he reports that the lower extremity edema has improved but he continues to be swollen. He is using his compression socks and is elevating his legs at night. He denies any pain in his legs and has no loss of ROM   Review of Systems  Constitutional: Negative.   Respiratory: Negative.   Cardiovascular: Positive for leg swelling.  Skin: Negative.   Neurological: Negative.   All other systems reviewed and are negative.      Objective:   Physical Exam  Constitutional: He is oriented to person, place, and time. He appears well-developed and well-nourished. No distress.  Cardiovascular: Normal rate, regular rhythm, normal heart sounds and intact distal pulses.  Exam reveals no gallop and no friction rub.   No murmur heard. Pulmonary/Chest: Effort normal and breath sounds normal. No respiratory distress. He has no wheezes. He has no rales. He exhibits no tenderness.  Musculoskeletal: Normal range of motion. He exhibits edema (+ 1 pitting edema in bilateral lower extremities. ). He exhibits no tenderness or deformity.  Neurological: He is alert and oriented to person, place, and time. He has normal reflexes. He displays normal reflexes. No cranial nerve deficit. Coordination normal.  Skin: Skin is warm and dry. No rash noted. No erythema. No pallor.  Psychiatric: He has a normal mood and affect. His behavior is normal. Judgment and thought content normal.  Vitals reviewed.     Assessment & Plan:  1. Bilateral lower extremity edema - D/c Norvasc - Increase Coreg from 12.5 to 25 mg to compensate - Continue with Compression socks and leg elevation - Follow up in one week or sooner   Dorothyann Peng, NP

## 2016-05-04 ENCOUNTER — Emergency Department (HOSPITAL_COMMUNITY)
Admission: EM | Admit: 2016-05-04 | Discharge: 2016-05-04 | Disposition: A | Discharge status: Home / Self Care | Payer: Federal, State, Local not specified - PPO | Attending: Emergency Medicine | Admitting: Emergency Medicine

## 2016-05-04 ENCOUNTER — Other Ambulatory Visit: Payer: Self-pay | Admitting: Cardiology

## 2016-05-04 ENCOUNTER — Telehealth: Payer: Self-pay | Admitting: Cardiology

## 2016-05-04 ENCOUNTER — Encounter (HOSPITAL_COMMUNITY): Payer: Self-pay | Admitting: Emergency Medicine

## 2016-05-04 ENCOUNTER — Emergency Department (HOSPITAL_COMMUNITY): Payer: Federal, State, Local not specified - PPO

## 2016-05-04 ENCOUNTER — Other Ambulatory Visit: Payer: Self-pay

## 2016-05-04 DIAGNOSIS — I2510 Atherosclerotic heart disease of native coronary artery without angina pectoris: Secondary | ICD-10-CM | POA: Diagnosis not present

## 2016-05-04 DIAGNOSIS — I509 Heart failure, unspecified: Secondary | ICD-10-CM | POA: Insufficient documentation

## 2016-05-04 DIAGNOSIS — N189 Chronic kidney disease, unspecified: Secondary | ICD-10-CM | POA: Insufficient documentation

## 2016-05-04 DIAGNOSIS — Z87891 Personal history of nicotine dependence: Secondary | ICD-10-CM | POA: Insufficient documentation

## 2016-05-04 DIAGNOSIS — I130 Hypertensive heart and chronic kidney disease with heart failure and stage 1 through stage 4 chronic kidney disease, or unspecified chronic kidney disease: Secondary | ICD-10-CM | POA: Diagnosis not present

## 2016-05-04 DIAGNOSIS — Z8546 Personal history of malignant neoplasm of prostate: Secondary | ICD-10-CM | POA: Insufficient documentation

## 2016-05-04 DIAGNOSIS — Z7982 Long term (current) use of aspirin: Secondary | ICD-10-CM | POA: Insufficient documentation

## 2016-05-04 DIAGNOSIS — R079 Chest pain, unspecified: Secondary | ICD-10-CM | POA: Insufficient documentation

## 2016-05-04 DIAGNOSIS — Z79899 Other long term (current) drug therapy: Secondary | ICD-10-CM | POA: Insufficient documentation

## 2016-05-04 LAB — BASIC METABOLIC PANEL
ANION GAP: 8 (ref 5–15)
BUN: 18 mg/dL (ref 6–20)
CHLORIDE: 105 mmol/L (ref 101–111)
CO2: 24 mmol/L (ref 22–32)
Calcium: 9.1 mg/dL (ref 8.9–10.3)
Creatinine, Ser: 1.27 mg/dL — ABNORMAL HIGH (ref 0.61–1.24)
GFR calc non Af Amer: 53 mL/min — ABNORMAL LOW (ref >60)
Glucose, Bld: 107 mg/dL — ABNORMAL HIGH (ref 65–99)
Potassium: 4.1 mmol/L (ref 3.5–5.1)
Sodium: 137 mmol/L (ref 135–145)

## 2016-05-04 LAB — I-STAT TROPONIN, ED: Troponin i, poc: 0 ng/mL (ref 0.00–0.08)

## 2016-05-04 LAB — CBC
HCT: 39.5 % (ref 39.0–52.0)
HEMOGLOBIN: 12.9 g/dL — AB (ref 13.0–17.0)
MCH: 28.7 pg (ref 26.0–34.0)
MCHC: 32.7 g/dL (ref 30.0–36.0)
MCV: 88 fL (ref 78.0–100.0)
Platelets: 221 10*3/uL (ref 150–400)
RBC: 4.49 MIL/uL (ref 4.22–5.81)
RDW: 13.4 % (ref 11.5–15.5)
WBC: 5.7 10*3/uL (ref 4.0–10.5)

## 2016-05-04 LAB — TROPONIN I: Troponin I: <0.03 ng/mL (ref <0.03)

## 2016-05-04 NOTE — Consult Note (Signed)
Cardiology Consult    Patient ID: JUVENAL UMAR MRN: [id number], DOB/AGE: May 02, 1936   Admit date: 05/04/2016 Date of Consult: 05/04/2016  Primary Physician: Dorothyann Peng, NP Reason for Consult: Chest pain Primary Cardiologist: Dr. Aundra Dubin  Requesting Provider: Dr. Audie Pinto  Patient Profile  Mr. Rossetti is a 80 year old male with a past medical history of CAD - DES to LAD in 2009, remote stenting to unknown vessel in 2000, HTN, HLD and CKD. He carries a diagnosis of CHF but last Echo was in 2011 and EF was normal.   History of Present Illness  Mr. Heckard woke up this morning and was making his coffee when he began to have pressure on the left side of his chest. He denies associated symptoms of nausea, diaphoresis, and shortness of breath. He describes the pain as a dull ache and rates the intensity one hour at 2 out of 10. Pain is not worsened with inspiration, or exacerbated with movement.  He tells me that all weekend he played golf both Saturday and Sunday. He did not have any chest pain or pressure over the weekend. He did however, endorse drinking alcohol somewhat heavily. He usually drinks 2-3 alcoholic beverages a day.   Of note he was seen by his PCP about 2 weeks ago, and reported increasing bilateral lower extremity edema. His PCP discontinued his amlodipine as it was felt his lower extremity edema could be caused by this. His BNP was not elevated. Because his amlodipine was discontinued, his PCP increased his beta blocker.  Patient has a history of CAD with stenting in 2009 which was done in the setting of angina that he says was much more severe than what he is experiencing today. His angina then was characterized by intense substernal chest pain with radiation to his neck and jaw.   EKG shows NSR with no acute ST/T wave changes. No change when compared to previous EKG's. His troponin is negative x 1.   Past Medical History   Past Medical History:  Diagnosis Date  . BACK  PAIN, LUMBAR 10/18/2009  . Benign paroxysmal positional vertigo 09/30/2007  . CAD (coronary artery disease)   . CHF (congestive heart failure) ([place])   . CKD (chronic kidney disease)   . COLONIC POLYPS, ADENOMATOUS, HX OF   . GERD (gastroesophageal reflux disease)   . Herpes zoster with other nervous system complications(053.19)   . Hyperlipidemia   . Hypertension   . Hyperthyroidism   . Loss of hearing    Bil/has hearing aids in both ears  . Nerve damage of left foot   . PERIPHERAL NEUROPATHY 10/11/2007  . 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 PROSTATE CANCER, HX OF 07/07/2007   s/p prostatectomy  . RLS (restless legs syndrome)   . Ulcer   . ULCERATIVE COLITIS, LEFT SIDED 2000  . URTICARIA, ALLERGIC 08/05/2009  . VENOUS INSUFFICIENCY 07/28/2010    Past Surgical History:  Procedure Laterality Date  . ANGIOPLASTY  1990's/2004   stent placement 2  . COLONOSCOPY W/ BIOPSIES AND POLYPECTOMY  06/06/2009   adenomatous polyp, diverticulosis, colitis  . INGUINAL HERNIA REPAIR    . PROSTATECTOMY    . RETINAL LASER PROCEDURE Right 2016 & 2017   . ROTATOR CUFF REPAIR Left   . TONSILLECTOMY       Allergies  Allergies  Allergen Reactions  . Cephalexin Hives    Inpatient Medications      Family History    Family History  Problem Relation Age of  Onset  . Heart attack Father   . Throat cancer Brother   . Multiple sclerosis Brother   . Multiple sclerosis Son   . Diabetes Maternal Uncle     x 2  . Diabetes Maternal Uncle   . Colon cancer Neg Hx   . Stroke Neg Hx     Social History    Social History   Social History  . Marital status: Married    Spouse name: N/A  . Number of children: N/A  . Years of education: N/A   Occupational History  . retired Retired  . [employer]    Social History Main Topics  . Smoking status: Former Smoker    Types: Cigarettes    Quit date: 09/08/1975  . Smokeless tobacco: Never Used  . Alcohol use 4.2 oz/week    7 Standard  drinks or equivalent per week     Comment: 1-2 "high balls" daily  . Drug use: No  . Sexual activity: Not on file   Other Topics Concern  . 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 All live in [place] area   Works part time at [employer]:  No chills, fever, night sweats or weight changes.  Cardiovascular: + chest pain, dyspnea on exertion, edema, orthopnea, palpitations, paroxysmal nocturnal dyspnea. Dermatological: No rash, lesions/masses Respiratory: No cough, dyspnea Urologic: No hematuria, dysuria Abdominal:   No nausea, vomiting, diarrhea, bright red blood per rectum, melena, or hematemesis Neurologic:  No visual changes, wkns, changes in mental status. All other systems reviewed and are otherwise negative except as noted above.  Physical Exam    Blood pressure 150/82, pulse (!) 57, temperature 97.6 F (36.4 C), temperature source Oral, resp. rate 16, height 6' 3"  (1.905 m), weight 212 lb (96.2 kg), SpO2 99 %.  General: Pleasant, NAD Psych: Normal affect. Neuro: Alert and oriented X 3. Moves all extremities spontaneously. HEENT: Normal  Neck: Supple without bruits or JVD. Lungs:  Resp regular and unlabored, CTA. Heart: RRR no s3, s4, or murmurs. Abdomen: Soft, non-tender, non-distended, BS + x 4.  Extremities: No clubbing, cyanosis or edema. DP/PT/Radials 2+ and equal bilaterally.  Labs    Troponin [facility] of Care Test)  Recent Labs  05/04/16 1139  TROPIPOC 0.00    Lab Results  Component Value Date   WBC 5.7 05/04/2016   HGB 12.9 (L) 05/04/2016   HCT 39.5 05/04/2016   MCV 88.0 05/04/2016   PLT 221 05/04/2016    Recent Labs Lab 05/04/16 1105  NA 137  K 4.1  CL 105  CO2 24  BUN 18  CREATININE 1.27*  CALCIUM 9.1  GLUCOSE 107*   Lab Results  Component Value Date   CHOL 162 01/30/2016   HDL 42.50 01/30/2016    LDLCALC 64 11/20/2015   TRIG 221.0 (H) 01/30/2016   No results found for: [facility]   Radiology Studies    Dg Chest 2 View  Result Date: 05/04/2016 CLINICAL DATA:  Chest pain. EXAM: CHEST  2 VIEW COMPARISON:  September 13, 2013. FINDINGS: The heart size and mediastinal contours are within normal limits. Both lungs are clear. No pneumothorax or pleural effusion is noted. The visualized skeletal structures are unremarkable. IMPRESSION: No active cardiopulmonary disease. Electronically Signed   By: Jeneen Rinks  Murlean Caller, M.D.   On: 05/04/2016 12:22    EKG & Cardiac Imaging    EKG: NSR    Assessment & Plan  1. Chest pain: Presents with chest pain that began early this morning, the patient describes the pain as a dull ache and is not very intense at all. He has a history of CAD and previous stenting, last in 2009 to his LAD. He is on 25 mg of Coreg twice a day, Plavix, aspirin, atorvastatin, and isosorbide 30 mg daily. The pain he is experiencing today is much less in severity when compared to the angina he felt in 2009 that led to the stenting of his LAD. Had a non ischemic myoview in 2016. Doesn't need an ischemic evaluation today unless he rules in for NSTEMI.   Continue to cycle troponins. Will increase isosorbide to 60 mg daily. Would also get echo as it has been 6 years since last echo and he has had increasing bilateral lower extremity edema.  2. HTN: Hypertensive with systolic blood pressures in the 160s. Unsure if he has had his antihypertensives this am.   3. HLD: On statin, continue same.   Signed, Arbutus Leas, NP 05/04/2016, 1:09 PM Pager: [phone]  Patient seen, examined. Available data reviewed. Agree with findings, assessment, and plan as outlined by Jettie Booze, NP. The patient is independently interviewed and examined. He is a 80 year old gentleman with known coronary artery disease and previous stenting last in 2009. History as outlined above. The patient had mild chest discomfort  for 4-5 hours this morning over a localized area on the left chest. His pain was not similar to his previous anginal pain. His initial troponin is normal. His EKG is within normal limits. On exam, he is alert and oriented, in no distress. Lungs are clear to auscultation bilaterally. Carotid upstrokes are normal without bruits. Jugular venous pressure is normal. Heart is regular rate and rhythm without murmurs or gallops. Abdomen is soft and nontender. There is no peripheral edema. In summary, this gentleman has low risk chest pain. He does have known CAD and I have recommended a repeat troponin here in the emergency room. If his troponin is negative he can be discharged home with outpatient follow-up. We have ordered an outpatient exercise Myoview stress test. Will arrange follow-up with Dr. Aundra Dubin. The patient will continue on his same medical program without changes. He is advised that if chest pain recurs or intensifies, he needs to return immediately for evaluation.  Sherren Mocha, M.D. 05/04/2016 4:41 PM

## 2016-05-04 NOTE — Discharge Instructions (Signed)
Your labs here were reassuring, the cardiologist who saw you (Dr. Burt Knack) recommended that you have your stress test done on Wednesday at 7:30 am as noted above. Please follow up with them as instructed. Return to the ER for changes or worsening symptoms.

## 2016-05-04 NOTE — ED Notes (Signed)
D/C instructions reviewed with wife and no more questions. Leaving with wife with no pain

## 2016-05-04 NOTE — Telephone Encounter (Signed)
Pt states he woke up this morning with discomfort in the left side of his chest, it is ongoing. Pt states he has not had any chest discomfort recently and has not had chest discomfort like this in the past. Pt denies any other symptoms except some shortness of breath with exertion. Pt states he played golf yesterday without any symptoms. Pt states BP 161/95 now.  Pt advised to go to [facility] for further evaluation. Pt advised not to drive.  Pt agreed with plan to go to [facility] for evaluation.

## 2016-05-04 NOTE — ED Provider Notes (Addendum)
[facility] DEPT Provider Note   CSN: [id number] Arrival date & time: 05/04/16  1057     History   Chief Complaint Chief Complaint  Patient presents with  . Chest Pain    HPI Kristopher Wilson is a 80 y.o. male.  HPI Pt states "I've got chest pain, woke up with it this morning". L sided chest pain. Pt c/o mild lightheadedness.  Patient has reviewed his history of ACS.  Has 2 stents in place.  Denies shortness of breath, diaphoresis, nausea, vomiting.  Pain lasted about an hour but is now gone.  Past Medical History:  Diagnosis Date  . BACK PAIN, LUMBAR 10/18/2009  . Benign paroxysmal positional vertigo 09/30/2007  . CAD (coronary artery disease)   . CHF (congestive heart failure) ([place])   . CKD (chronic kidney disease)   . COLONIC POLYPS, ADENOMATOUS, HX OF   . GERD (gastroesophageal reflux disease)   . Herpes zoster with other nervous system complications(053.19)   . Hyperlipidemia   . Hypertension   . Hyperthyroidism   . Loss of hearing    Bil/has hearing aids in both ears  . Nerve damage of left foot   . PERIPHERAL NEUROPATHY 10/11/2007  . 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 PROSTATE CANCER, HX OF 07/07/2007   s/p prostatectomy  . RLS (restless legs syndrome)   . Ulcer   . ULCERATIVE COLITIS, LEFT SIDED 2000  . URTICARIA, ALLERGIC 08/05/2009  . VENOUS INSUFFICIENCY 07/28/2010    Patient Active Problem List   Diagnosis Date Noted  . Routine general medical examination at a health care facility 02/06/2016  . Lumbosacral radiculopathy due to herpes zoster 12/10/2015  . Neck pain on right side 07/22/2015  . Long term current use of antithrombotics/antiplatelets - clopidogrel and ASA 03/28/2015  . Left shoulder pain 05/31/2014  . Restless leg syndrome 11/14/2013  . Night sweats 09/12/2013  . Hyperlipidemia 12/18/2010  . VENOUS INSUFFICIENCY 07/28/2010  . 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 ULCERATIVE COLITIS, LEFT SIDED 12/27/2009  . COLONIC POLYPS,  ADENOMATOUS, HX OF 12/27/2009  . BACK PAIN, LUMBAR 10/18/2009  . GERD 04/18/2008  . BENIGN PAROXYSMAL POSITIONAL VERTIGO 09/30/2007  . Essential hypertension 07/07/2007  . CORONARY ARTERY DISEASE 07/07/2007  . PROSTATE CANCER, HX OF 07/07/2007    Past Surgical History:  Procedure Laterality Date  . ANGIOPLASTY  1990's/2004   stent placement 2  . COLONOSCOPY W/ BIOPSIES AND POLYPECTOMY  06/06/2009   adenomatous polyp, diverticulosis, colitis  . INGUINAL HERNIA REPAIR    . PROSTATECTOMY    . RETINAL LASER PROCEDURE Right 2016 & 2017   . ROTATOR CUFF REPAIR Left   . TONSILLECTOMY         Home Medications    Prior to Admission medications   Medication Sig Start Date End Date Taking? Authorizing Provider  aspirin 81 MG tablet Take 81 mg by mouth daily.     Yes Historical Provider, MD  atorvastatin (LIPITOR) 40 MG tablet Take 1 tablet daily Patient taking differently: Take 40 mg by mouth daily.  03/26/16  Yes Dorothyann Peng, NP  carvedilol (COREG) 12.5 MG tablet TAKE 1 TABLET TWICE A DAY  WITH MEALS Patient taking differently: Take 25 mg by mouth 2 (two) times daily with a meal. TAKE 1 TABLET TWICE A DAY  WITH MEALS 03/26/16  Yes Dorothyann Peng, NP  clopidogrel (PLAVIX) 75 MG tablet Take 1 tablet (75 mg total) by mouth daily. 01/21/16  Yes Dorothyann Peng, NP  furosemide (LASIX) 20 MG  tablet Take 1 tablet (20 mg total) by mouth daily. 04/21/16  Yes Dorothyann Peng, NP  isosorbide mononitrate (IMDUR) 30 MG 24 hr tablet TAKE 1 TABLET (30 MG TOTAL) BY MOUTH DAILY. 02/11/16  Yes Burtis Junes, NP  Mesalamine 800 MG TBEC TAKE 2 TABLETS (=1,600MG ) 3TIMES A DAY Patient taking differently: TAKE 2 TABLETS (=1,600MG ) 3 TIMES A DAY 01/21/16  Yes Dorothyann Peng, NP  pantoprazole (PROTONIX) 40 MG tablet Take 1 tablet (40 mg total) by mouth daily. 05/30/15  Yes Dorena Cookey, MD  pramipexole (MIRAPEX) 1 MG tablet Take 1 tablet at bedtime for Restless Leg Syndrome. Patient taking differently: Take 1 mg by  mouth at bedtime.  03/26/16  Yes Dorothyann Peng, NP  amLODipine (NORVASC) 10 MG tablet Take 1 tablet (10 mg total) by mouth daily. Patient not taking: Reported on 05/04/2016 02/06/16   Dorothyann Peng, NP  meclizine (ANTIVERT) 12.5 MG tablet Take 1 tablet (12.5 mg total) by mouth 2 (two) times daily as needed for dizziness. 01/16/16   Dorothyann Peng, NP  nitroGLYCERIN (NITROSTAT) 0.4 MG SL tablet DISSOLVE 1 TABLET UNDER THETONGUE EVERY 5 MINUTES AS  NEEDED Patient taking differently: Place 0.4 mg under the tongue every 5 (five) minutes as needed for chest pain.  09/13/14   Dorena Cookey, MD    Family History Family History  Problem Relation Age of Onset  . Heart attack Father   . Throat cancer Brother   . Multiple sclerosis Brother   . Multiple sclerosis Son   . Diabetes Maternal Uncle     x 2  . Diabetes Maternal Uncle   . Colon cancer Neg Hx   . Stroke Neg Hx     Social History Social History  Substance Use Topics  . Smoking status: Former Smoker    Types: Cigarettes    Quit date: 09/08/1975  . Smokeless tobacco: Never Used  . Alcohol use 4.2 oz/week    7 Standard drinks or equivalent per week     Comment: 1-2 "high balls" daily     Allergies   Cephalexin   Review of Systems Review of Systems All other systems reviewed and are negative  Physical Exam Updated Vital Signs BP 131/82   Pulse (!) 55   Temp 97.6 F (36.4 C) (Oral)   Resp 11   Ht 6\' 3"  (1.905 m)   Wt 212 lb (96.2 kg)   SpO2 100%   BMI 26.50 kg/m   Physical Exam Physical Exam  Nursing note and vitals reviewed. Constitutional: He is oriented to person, place, and time. He appears well-developed and well-nourished. No distress.  HENT:  Head: Normocephalic and atraumatic.  Eyes: Pupils are equal, round, and reactive to light.  Neck: Normal range of motion.  Cardiovascular: Normal rate and intact distal pulses.   Pulmonary/Chest: No respiratory distress.  Abdominal: Normal appearance. He exhibits no  distension.  Musculoskeletal: Normal range of motion.  Neurological: He is alert and oriented to person, place, and time. No cranial nerve deficit.  Skin: Skin is warm and dry. No rash noted.  Psychiatric: He has a normal mood and affect. His behavior is normal.    ED Treatments / Results  Labs (all labs ordered are listed, but only abnormal results are displayed) Labs Reviewed  BASIC METABOLIC PANEL - Abnormal; Notable for the following:       Result Value   Glucose, Bld 107 (*)    Creatinine, Ser 1.27 (*)    GFR calc non Af  Amer 32 (*)    All other components within normal limits  CBC - Abnormal; Notable for the following:    Hemoglobin 12.9 (*)    All other components within normal limits  TROPONIN I  I-STAT TROPOININ, ED    EKG  EKG Interpretation  Date/Time:  Monday May 04 2016 10:59:59 EDT Ventricular Rate:  62 PR Interval:  162 QRS Duration: 98 QT Interval:  430 QTC Calculation: 436 R Axis:   60 Text Interpretation:  Normal sinus rhythm Normal ECG Confirmed by Ayannah Faddis  MD, Zong ([id number]) on 05/04/2016 11:21:18 AM       Radiology Dg Chest 2 View  Result Date: 05/04/2016 CLINICAL DATA:  Chest pain. EXAM: CHEST  2 VIEW COMPARISON:  September 13, 2013. FINDINGS: The heart size and mediastinal contours are within normal limits. Both lungs are clear. No pneumothorax or pleural effusion is noted. The visualized skeletal structures are unremarkable. IMPRESSION: No active cardiopulmonary disease. Electronically Signed   By: Marijo Conception, M.D.   On: 05/04/2016 12:22    Procedures Procedures (including critical care time)  Medications Ordered in ED Medications - No data to display   Initial Impression / Assessment and Plan / ED Course  I have reviewed the triage vital signs and the nursing notes.  Pertinent labs & imaging results that were available during my care of the patient were reviewed by me and considered in my medical decision making (see chart for  details).  Clinical Course  Cardiology evaluated the patient in emergency department and will admit to rule out MI.    Final Clinical Impressions(s) / ED Diagnoses   Final diagnoses:  Chest pain, unspecified chest pain type    New Prescriptions New Prescriptions   No medications on file     Leonard Schwartz, MD 05/04/16 [id number]    Leonard Schwartz, MD 05/04/16 [phone]

## 2016-05-04 NOTE — ED Triage Notes (Signed)
Pt states "I've got chest pain, woke up with it this morning". L sided chest pain. Pt c/o mild lightheadedness. In NAD, ambulatory with steady gait.

## 2016-05-04 NOTE — Telephone Encounter (Signed)
New message     Pt c/o of Chest Pain: STAT if CP now or developed within 24 hours  1. Are you having CP right now? Yes  2. Are you experiencing any other symptoms (ex. SOB, nausea, vomiting, sweating)? no  3. How long have you been experiencing CP? This morning  4. Is your CP continuous or coming and going? continuous  5. Have you taken Nitroglycerin? no ?

## 2016-05-04 NOTE — ED Provider Notes (Signed)
Nursing staff advising me that pt is wanting to leave, was told by Cardiologist that if his second trop was neg he could go home and have a stress test on Wednesday. Apparently that message was never relayed to Dr. Audie Pinto, who has since left. Second trop is negative, and thus per cardiology recommendations he can go home and have stress test outpt on 05/06/16. Please see their consult notes for further documentation of f/up instructions and recommendations. Pt's discharge summary was completed by me, discussed f/up with cardiology as instructed, and return precautions discussed. Pt stable at time of discharge.  Results for orders placed or performed during the hospital encounter of [id number]  Basic metabolic panel  Result Value Ref Range   Sodium 137 135 - 145 mmol/L   Potassium 4.1 3.5 - 5.1 mmol/L   Chloride 105 101 - 111 mmol/L   CO2 24 22 - 32 mmol/L   Glucose, Bld 107 (H) 65 - 99 mg/dL   BUN 18 6 - 20 mg/dL   Creatinine, Ser 1.27 (H) 0.61 - 1.24 mg/dL   Calcium 9.1 8.9 - 10.3 mg/dL   GFR calc non Af Amer 53 (L) >60 mL/min   GFR calc Af Amer >60 >60 mL/min   Anion gap 8 5 - 15  CBC  Result Value Ref Range   WBC 5.7 4.0 - 10.5 K/uL   RBC 4.49 4.22 - 5.81 MIL/uL   Hemoglobin 12.9 (L) 13.0 - 17.0 g/dL   HCT 39.5 39.0 - 52.0 %   MCV 88.0 78.0 - 100.0 fL   MCH 28.7 26.0 - 34.0 pg   MCHC 32.7 30.0 - 36.0 g/dL   RDW 13.4 11.5 - 15.5 %   Platelets 221 150 - 400 K/uL  Troponin I  Result Value Ref Range   Troponin I <0.03 <0.03 ng/mL  I-stat troponin, ED  Result Value Ref Range   Troponin i, poc 0.00 0.00 - 0.08 ng/mL   Comment 3           Dg Chest 2 View  Result Date: 05/04/2016 CLINICAL DATA:  Chest pain. EXAM: CHEST  2 VIEW COMPARISON:  September 13, 2013. FINDINGS: The heart size and mediastinal contours are within normal limits. Both lungs are clear. No pneumothorax or pleural effusion is noted. The visualized skeletal structures are unremarkable. IMPRESSION: No active cardiopulmonary  disease. Electronically Signed   By: Marijo Conception, M.D.   On: 05/04/2016 12:22    BP 131/82   Pulse (!) 55   Temp 97.6 F (36.4 C) (Oral)   Resp 11   Ht 6\' 3"  (1.905 m)   Wt 96.2 kg   SpO2 100%   BMI 26.50 kg/m     Anneke Cundy Camprubi-Soms, PA-C 05/04/16 1746

## 2016-05-05 ENCOUNTER — Telehealth (HOSPITAL_COMMUNITY): Payer: Self-pay | Admitting: *Deleted

## 2016-05-05 NOTE — Telephone Encounter (Signed)
Patient given detailed instructions per Myocardial Perfusion Study Information Sheet for the test on 05/06/16 at 7:45. Patient notified to arrive 15 minutes early and that it is imperative to arrive on time for appointment to keep from having the test rescheduled.  If you need to cancel or reschedule your appointment, please call the office within 24 hours of your appointment. Failure to do so may result in a cancellation of your appointment, and a $50 no show fee. Patient verbalized understanding.Kristopher Wilson

## 2016-05-06 ENCOUNTER — Ambulatory Visit (HOSPITAL_COMMUNITY): Payer: Federal, State, Local not specified - PPO | Attending: Cardiovascular Disease

## 2016-05-06 DIAGNOSIS — R079 Chest pain, unspecified: Secondary | ICD-10-CM | POA: Insufficient documentation

## 2016-05-06 DIAGNOSIS — I2510 Atherosclerotic heart disease of native coronary artery without angina pectoris: Secondary | ICD-10-CM | POA: Diagnosis not present

## 2016-05-06 DIAGNOSIS — R9439 Abnormal result of other cardiovascular function study: Secondary | ICD-10-CM | POA: Insufficient documentation

## 2016-05-06 DIAGNOSIS — I110 Hypertensive heart disease with heart failure: Secondary | ICD-10-CM | POA: Diagnosis not present

## 2016-05-06 DIAGNOSIS — I509 Heart failure, unspecified: Secondary | ICD-10-CM | POA: Diagnosis not present

## 2016-05-06 LAB — MYOCARDIAL PERFUSION IMAGING
LV dias vol: 91 mL (ref 62–150)
LV sys vol: 30 mL
MPHR: 143 {beats}/min
Peak BP: 150 mmHg
Peak HR: 150 {beats}/min
Percent HR: 104 %
RATE: 0.26
Rest HR: 63 {beats}/min
SDS: 0
SRS: 0
SSS: 0
TID: 0.84

## 2016-05-06 MED ORDER — TECHNETIUM TC 99M TETROFOSMIN IV KIT
10.2000 | PACK | Freq: Once | INTRAVENOUS | Status: AC | PRN
  Administered 2016-05-06: 10 via INTRAVENOUS
  Filled 2016-05-06: qty 10

## 2016-05-06 MED ORDER — TECHNETIUM TC 99M TETROFOSMIN IV KIT
32.3000 | PACK | Freq: Once | INTRAVENOUS | Status: AC | PRN
  Administered 2016-05-06: 32.3 via INTRAVENOUS
  Filled 2016-05-06: qty 32

## 2016-05-08 ENCOUNTER — Other Ambulatory Visit: Payer: Self-pay | Admitting: Adult Health

## 2016-05-08 DIAGNOSIS — H8113 Benign paroxysmal vertigo, bilateral: Secondary | ICD-10-CM

## 2016-05-08 NOTE — Telephone Encounter (Signed)
Ok to refill for an additional month  

## 2016-07-28 ENCOUNTER — Encounter: Payer: Self-pay | Admitting: Physician Assistant

## 2016-07-28 ENCOUNTER — Other Ambulatory Visit (INDEPENDENT_AMBULATORY_CARE_PROVIDER_SITE_OTHER): Payer: Federal, State, Local not specified - PPO

## 2016-07-28 ENCOUNTER — Ambulatory Visit (INDEPENDENT_AMBULATORY_CARE_PROVIDER_SITE_OTHER): Payer: Federal, State, Local not specified - PPO | Admitting: Physician Assistant

## 2016-07-28 VITALS — BP 126/80 | HR 76 | Ht 75.0 in | Wt 223.6 lb

## 2016-07-28 DIAGNOSIS — K5190 Ulcerative colitis, unspecified, without complications: Secondary | ICD-10-CM

## 2016-07-28 DIAGNOSIS — R197 Diarrhea, unspecified: Secondary | ICD-10-CM | POA: Diagnosis not present

## 2016-07-28 DIAGNOSIS — A09 Infectious gastroenteritis and colitis, unspecified: Secondary | ICD-10-CM

## 2016-07-28 LAB — C-REACTIVE PROTEIN: CRP: <0.1 mg/dL — ABNORMAL LOW (ref 0.5–20.0)

## 2016-07-28 NOTE — Progress Notes (Signed)
Chief Complaint: Diarrhea  HPI:  Kristopher Wilson is a 80 year old Caucasian male with a past medical history significant for CAD, CHF, CAD, hyperlipidemia, hypertension, GERD and left-sided ulcerative colitis who presents to clinic today with a complaint of diarrhea. The patient typically follows with Dr. Carlean Purl. He was last seen in clinic on 07/18/15 for a surveillance colonoscopy. Impression at that time was of one sessile polyp in the ileocecal valve, moderate diverticulosis and an otherwise normal exam, left-sided UC was noted to be in endoscopic remission. Patient was told to add Imodium AD when necessary in addition to his Mesalamine. Pathology revealed a 5 mm adenoma and colitis in remission. Patient was placed in recall for 2019.   Today, the patient tells me that he was doing very well until about 6 weeks ago when he started with more frequent stools in the morning. The patient tells me that 3 out of 7 mornings he will have 4-5 looser bowel movements in a row when he wakes up, typically spaced about 20 minutes apart, after this he'll proceed with a normal day. He still continues his Mesalamine 800 mg 2 tabs 3 times a day. He does describe some associated occasional abdominal cramping before a bowel movement which is relieved afterwards. The patient denies any sick contacts, travel or change in medications including the antibiotics before this started.   Patient denies fever, chills, blood in his stool, weight loss, fatigue, anorexia, nausea, vomiting, heartburn, reflux or symptoms that awaken him at night.  Past Medical History:  Diagnosis Date  . BACK PAIN, LUMBAR 10/18/2009  . Benign paroxysmal positional vertigo 09/30/2007  . CAD (coronary artery disease)   . CHF (congestive heart failure) ([place])   . CKD (chronic kidney disease)   . COLONIC POLYPS, ADENOMATOUS, HX OF   . GERD (gastroesophageal reflux disease)   . Herpes zoster with other nervous system complications(053.19)   .  Hyperlipidemia   . Hypertension   . Hyperthyroidism   . Loss of hearing    Bil/has hearing aids in both ears  . Nerve damage of left foot   . PERIPHERAL NEUROPATHY 10/11/2007  . 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 PROSTATE CANCER, HX OF 07/07/2007   s/p prostatectomy  . RLS (restless legs syndrome)   . Ulcer ([place])   . ULCERATIVE COLITIS, LEFT SIDED 2000  . URTICARIA, ALLERGIC 08/05/2009  . VENOUS INSUFFICIENCY 07/28/2010    Past Surgical History:  Procedure Laterality Date  . ANGIOPLASTY  1990's/2004   stent placement 2  . COLONOSCOPY W/ BIOPSIES AND POLYPECTOMY  06/06/2009   adenomatous polyp, diverticulosis, colitis  . INGUINAL HERNIA REPAIR    . PROSTATECTOMY    . RETINAL LASER PROCEDURE Right 2016 & 2017   . ROTATOR CUFF REPAIR Left   . TONSILLECTOMY      Current Outpatient Prescriptions  Medication Sig Dispense Refill  . amLODipine (NORVASC) 10 MG tablet Take 1 tablet (10 mg total) by mouth daily. 90 tablet 3  . aspirin 81 MG tablet Take 81 mg by mouth daily.      Marland Kitchen atorvastatin (LIPITOR) 40 MG tablet Take 1 tablet daily (Patient taking differently: Take 40 mg by mouth daily. ) 90 tablet 3  . carvedilol (COREG) 12.5 MG tablet TAKE 1 TABLET TWICE A DAY  WITH MEALS (Patient taking differently: Take 25 mg by mouth 2 (two) times daily with a meal. TAKE 1 TABLET TWICE A DAY  WITH MEALS) 30 tablet 0  . clopidogrel (PLAVIX) 75 MG tablet Take 1  tablet (75 mg total) by mouth daily. 90 tablet 3  . furosemide (LASIX) 20 MG tablet Take 1 tablet (20 mg total) by mouth daily. 30 tablet 3  . isosorbide mononitrate (IMDUR) 30 MG 24 hr tablet TAKE 1 TABLET (30 MG TOTAL) BY MOUTH DAILY. 30 tablet 6  . meclizine (ANTIVERT) 12.5 MG tablet TAKE 1 TABLET (12.5 MG TOTAL) BY MOUTH 2 (TWO) TIMES DAILY AS NEEDED FOR DIZZINESS. 30 tablet 0  . Mesalamine 800 MG TBEC TAKE 2 TABLETS (=1,600MG ) 3TIMES A DAY (Patient taking differently: TAKE 2 TABLETS (=1,600MG ) 3 TIMES A DAY) 540 tablet 1  .  nitroGLYCERIN (NITROSTAT) 0.4 MG SL tablet DISSOLVE 1 TABLET UNDER THETONGUE EVERY 5 MINUTES AS  NEEDED (Patient taking differently: Place 0.4 mg under the tongue every 5 (five) minutes as needed for chest pain. ) 25 tablet 1  . pantoprazole (PROTONIX) 40 MG tablet Take 1 tablet (40 mg total) by mouth daily. 30 tablet 0  . pramipexole (MIRAPEX) 1 MG tablet Take 1 tablet at bedtime for Restless Leg Syndrome. (Patient taking differently: Take 1 mg by mouth at bedtime. ) 90 tablet 3   No current facility-administered medications for this visit.     Allergies as of 07/28/2016 - Review Complete 07/28/2016  Allergen Reaction Noted  . Cephalexin Hives 08/07/2009    Family History  Problem Relation Age of Onset  . Heart attack Father   . Throat cancer Brother   . Multiple sclerosis Brother   . Multiple sclerosis Son   . Diabetes Maternal Uncle     x 2  . Diabetes Maternal Uncle   . Colon cancer Neg Hx   . Stroke Neg Hx     Social History   Social History  . Marital status: Married    Spouse name: N/A  . Number of children: N/A  . Years of education: N/A   Occupational History  . retired Retired  . [employer]    Social History Main Topics  . Smoking status: Former Smoker    Types: Cigarettes    Quit date: 09/08/1975  . Smokeless tobacco: Never Used  . Alcohol use 4.2 oz/week    7 Standard drinks or equivalent per week     Comment: 1-2 "high balls" daily  . Drug use: No  . Sexual activity: Not on file   Other Topics Concern  . 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 All live in [place] area   Works part time at [employer]:     Constitutional: No weight loss, fever, chills, weakness or fatigue Cardiovascular: No chest pain Respiratory: No SOB  Gastrointestinal: See HPI and otherwise negative    Physical Exam:  Vital signs: BP 126/80  (BP Location: Left Arm, Patient Position: Sitting, Cuff Size: Normal)   Pulse 76   Ht 6\' 3"  (1.905 m)   Wt 223 lb 9.6 oz (101.4 kg)   BMI 27.95 kg/m   Constitutional:   Pleasant Caucasian male appears to be in NAD, Well developed, Well nourished, alert and cooperative Respiratory: Respirations even and unlabored. Lungs clear to auscultation bilaterally.   No wheezes, crackles, or rhonchi.  Cardiovascular: Normal S1, S2. No MRG. Regular rate and rhythm. No peripheral edema, cyanosis or pallor.  Gastrointestinal:  Soft, nondistended, nontender. No rebound or guarding.  Normal bowel sounds. No appreciable masses or hepatomegaly. Rectal:  Not performed.  Msk:  Symmetrical without gross deformities. Without edema, no deformity or joint abnormality.  Psychiatric:  Demonstrates good judgement and reason without abnormal affect or behaviors.  MOST RECENT LABS: CBC    Component Value Date/Time   WBC 5.7 05/04/2016 1105   RBC 4.49 05/04/2016 1105   HGB 12.9 (L) 05/04/2016 1105   HCT 39.5 05/04/2016 1105   PLT 221 05/04/2016 1105   MCV 88.0 05/04/2016 1105   MCH 28.7 05/04/2016 1105   MCHC 32.7 05/04/2016 1105   RDW 13.4 05/04/2016 1105   LYMPHSABS 2.0 04/21/2016 1704   MONOABS 0.5 04/21/2016 1704   EOSABS 0.2 04/21/2016 1704   BASOSABS 0.0 04/21/2016 1704    CMP     Component Value Date/Time   NA 137 05/04/2016 1105   K 4.1 05/04/2016 1105   CL 105 05/04/2016 1105   CO2 24 05/04/2016 1105   GLUCOSE 107 (H) 05/04/2016 1105   BUN 18 05/04/2016 1105   CREATININE 1.27 (H) 05/04/2016 1105   CREATININE 1.17 11/20/2015 0854   CALCIUM 9.1 05/04/2016 1105   PROT 6.2 01/30/2016 0837   ALBUMIN 4.5 01/30/2016 0837   AST 17 01/30/2016 0837   ALT 21 01/30/2016 0837   ALKPHOS 79 01/30/2016 0837   BILITOT 0.9 01/30/2016 0837   GFRNONAA 53 (L) 05/04/2016 1105   GFRAA >60 05/04/2016 1105    Assessment: 1. Diarrhea: Patient started with diarrhea 6 weeks ago, patient notes that 3 out of 7  morning's he will wake up and have 3-4 loose stools about 20 minutes apart and then will proceed with a normal day with no further bowel movements, patient had somewhat similar symptoms a year ago before time of his last colonoscopy which showed that his UC was in remission at the time; do not suspect that this is a flare of patient's UC, this follows more typical IBS diarrhea, though will order stool studies to rule out infectious cause and CRP as well as fecal calprotectin and leukocytes 2. History of left-sided ulcerative colitis: Patient maintained on Mesalamine 800 mg, 2 tabs 3 times a day, last colonoscopy in October 2016 showed remission  Plan: 1. Ordered labs to include C. difficile, fecal leukocytes, fecal calprotectin as well as a CRP 2. Will direct patient's care after results of above studies. Did discuss that this does not sound as though it is infectious or a flare of his UC, but we will make sure before telling him to use Imodium AD 3. Patient to follow with Dr. Carlean Purl in 6-8 weeks.  Ellouise Newer, PA-C [facility] 07/28/2016, 8:46 AM  Cc: Dorothyann Peng, NP

## 2016-07-28 NOTE — Patient Instructions (Addendum)
Your physician has requested that you go to the basement for lab work before leaving today.  If you are age 78 or older, your body mass index should be between 23-30. Your Body mass index is 27.95 kg/m. If this is out of the aforementioned range listed, please consider follow up with your Primary Care Provider.  If you are age 85 or younger, your body mass index should be between 19-25. Your Body mass index is 27.95 kg/m. If this is out of the aformentioned range listed, please consider follow up with your Primary Care Provider.

## 2016-07-29 ENCOUNTER — Other Ambulatory Visit: Payer: Federal, State, Local not specified - PPO

## 2016-07-29 DIAGNOSIS — K5190 Ulcerative colitis, unspecified, without complications: Secondary | ICD-10-CM

## 2016-07-29 DIAGNOSIS — R197 Diarrhea, unspecified: Secondary | ICD-10-CM

## 2016-07-30 LAB — CLOSTRIDIUM DIFFICILE BY PCR: Toxigenic C. Difficile by PCR: NOT DETECTED

## 2016-08-02 NOTE — Progress Notes (Signed)
Agree with Ms. Mort Sawyers evaluation and management. So far fecal leukocytes (lactoferrin) and C diff negative so UC flare not likely and neither is infection. Calprotectin level pending.

## 2016-08-03 LAB — FECAL LACTOFERRIN, QUANT: Lactoferrin: NEGATIVE

## 2016-08-07 LAB — CALPROTECTIN: CALPROTECTIN: 105 ug/g (ref <=162.9)

## 2016-08-21 ENCOUNTER — Other Ambulatory Visit: Payer: Self-pay | Admitting: Adult Health

## 2016-09-25 ENCOUNTER — Encounter: Payer: Self-pay | Admitting: Adult Health

## 2016-09-25 ENCOUNTER — Ambulatory Visit (INDEPENDENT_AMBULATORY_CARE_PROVIDER_SITE_OTHER): Payer: Federal, State, Local not specified - PPO | Admitting: Adult Health

## 2016-09-25 VITALS — BP 136/94 | HR 86 | Temp 97.7°F | Ht 75.0 in | Wt 225.0 lb

## 2016-09-25 DIAGNOSIS — J0140 Acute pansinusitis, unspecified: Secondary | ICD-10-CM | POA: Diagnosis not present

## 2016-09-25 MED ORDER — FLUTICASONE PROPIONATE 50 MCG/ACT NA SUSP
2.0000 | Freq: Every day | NASAL | 6 refills | Status: DC
Start: 2016-09-25 — End: 2017-02-24

## 2016-09-25 MED ORDER — DOXYCYCLINE HYCLATE 100 MG PO CAPS
100.0000 mg | ORAL_CAPSULE | Freq: Two times a day (BID) | ORAL | 0 refills | Status: DC
Start: 2016-09-25 — End: 2017-02-24

## 2016-09-25 NOTE — Progress Notes (Signed)
Subjective:    Patient ID: Kristopher Wilson, male    DOB: [birth year], 81 y.o.   MRN: [id number]  Sinusitis  This is a new problem. The current episode started 1 to 4 weeks ago (10 days ). There has been no fever. Associated symptoms include congestion, coughing, headaches, sinus pressure and a sore throat. Pertinent negatives include no ear pain or shortness of breath. Past treatments include nothing. The treatment provided no relief.      Review of Systems  Constitutional: Positive for fatigue.  HENT: Positive for congestion, postnasal drip, rhinorrhea, sinus pain, sinus pressure and sore throat. Negative for ear pain and tinnitus.   Respiratory: Positive for cough. Negative for chest tightness, shortness of breath and wheezing.   Cardiovascular: Negative.   Neurological: Positive for headaches.  All other systems reviewed and are negative.  Past Medical History:  Diagnosis Date  . BACK PAIN, LUMBAR 10/18/2009  . Benign paroxysmal positional vertigo 09/30/2007  . CAD (coronary artery disease)   . CHF (congestive heart failure) ([place])   . CKD (chronic kidney disease)   . COLONIC POLYPS, ADENOMATOUS, HX OF   . GERD (gastroesophageal reflux disease)   . Herpes zoster with other nervous system complications(053.19)   . Hyperlipidemia   . Hypertension   . Hyperthyroidism   . Loss of hearing    Bil/has hearing aids in both ears  . Nerve damage of left foot   . PERIPHERAL NEUROPATHY 10/11/2007  . 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 PROSTATE CANCER, HX OF 07/07/2007   s/p prostatectomy  . RLS (restless legs syndrome)   . Ulcer ([place])   . ULCERATIVE COLITIS, LEFT SIDED 2000  . URTICARIA, ALLERGIC 08/05/2009  . VENOUS INSUFFICIENCY 07/28/2010    Social History   Social History  . Marital status: Married    Spouse name: N/A  . Number of children: N/A  . Years of education: N/A   Occupational History  . retired Retired  . [employer]    Social History Main Topics    . Smoking status: Former Smoker    Types: Cigarettes    Quit date: 09/08/1975  . Smokeless tobacco: Never Used  . Alcohol use 4.2 oz/week    7 Standard drinks or equivalent per week     Comment: 1-2 "high balls" daily  . Drug use: No  . Sexual activity: Not on file   Other Topics Concern  . 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 All live in [place] area   Works part time at [employer]    Past Surgical History:  Procedure Laterality Date  . ANGIOPLASTY  1990's/2004   stent placement 2  . COLONOSCOPY W/ BIOPSIES AND POLYPECTOMY  06/06/2009   adenomatous polyp, diverticulosis, colitis  . INGUINAL HERNIA REPAIR    . PROSTATECTOMY    . RETINAL LASER PROCEDURE Right 2016 & 2017   . ROTATOR CUFF REPAIR Left   . TONSILLECTOMY      Family History  Problem Relation Age of Onset  . Heart attack Father   . Throat cancer Brother   . Multiple sclerosis Brother   . Multiple sclerosis Son   . Diabetes Maternal Uncle     x 2  . Diabetes Maternal Uncle   . Colon cancer Neg Hx   . Stroke Neg Hx  Allergies  Allergen Reactions  . Cephalexin Hives    Current Outpatient Prescriptions on File Prior to Visit  Medication Sig Dispense Refill  . amLODipine (NORVASC) 10 MG tablet Take 1 tablet (10 mg total) by mouth daily. 90 tablet 3  . aspirin 81 MG tablet Take 81 mg by mouth daily.      Marland Kitchen atorvastatin (LIPITOR) 40 MG tablet Take 1 tablet daily (Patient taking differently: Take 40 mg by mouth daily. ) 90 tablet 3  . carvedilol (COREG) 12.5 MG tablet TAKE 1 TABLET TWICE A DAY  WITH MEALS (Patient taking differently: Take 25 mg by mouth 2 (two) times daily with a meal. TAKE 1 TABLET TWICE A DAY  WITH MEALS) 30 tablet 0  . carvedilol (COREG) 12.5 MG tablet TAKE 1 TABLET TWICE DAILY  WITH MEALS 180 tablet 1  . clopidogrel (PLAVIX) 75 MG tablet Take 1 tablet (75 mg total) by mouth daily. 90  tablet 3  . furosemide (LASIX) 20 MG tablet Take 1 tablet (20 mg total) by mouth daily. 30 tablet 3  . isosorbide mononitrate (IMDUR) 30 MG 24 hr tablet TAKE 1 TABLET (30 MG TOTAL) BY MOUTH DAILY. 30 tablet 6  . meclizine (ANTIVERT) 12.5 MG tablet TAKE 1 TABLET (12.5 MG TOTAL) BY MOUTH 2 (TWO) TIMES DAILY AS NEEDED FOR DIZZINESS. 30 tablet 0  . Mesalamine 800 MG TBEC TAKE 2 TABLETS (=1,600MG ) 3TIMES A DAY 540 tablet 1  . nitroGLYCERIN (NITROSTAT) 0.4 MG SL tablet DISSOLVE 1 TABLET UNDER THETONGUE EVERY 5 MINUTES AS  NEEDED (Patient taking differently: Place 0.4 mg under the tongue every 5 (five) minutes as needed for chest pain. ) 25 tablet 1  . pantoprazole (PROTONIX) 40 MG tablet Take 1 tablet (40 mg total) by mouth daily. 30 tablet 0  . pramipexole (MIRAPEX) 1 MG tablet Take 1 tablet at bedtime for Restless Leg Syndrome. (Patient taking differently: Take 1 mg by mouth at bedtime. ) 90 tablet 3   No current facility-administered medications on file prior to visit.     BP (!) 136/94 (BP Location: Right Arm, Patient Position: Sitting, Cuff Size: Large)   Pulse 86   Temp 97.7 F (36.5 C) (Oral)   Ht 6\' 3"  (1.905 m)   Wt 225 lb (102.1 kg)   SpO2 95%   BMI 28.12 kg/m       Objective:   Physical Exam  Constitutional: He is oriented to person, place, and time. He appears well-developed and well-nourished. No distress.  HENT:  Head: Normocephalic and atraumatic.  Right Ear: Hearing, tympanic membrane, external ear and ear canal normal.  Left Ear: Hearing, tympanic membrane, external ear and ear canal normal.  Nose: Mucosal edema and rhinorrhea present. Right sinus exhibits maxillary sinus tenderness and frontal sinus tenderness. Left sinus exhibits maxillary sinus tenderness and frontal sinus tenderness.  Mouth/Throat: Uvula is midline and mucous membranes are normal. Oropharyngeal exudate present.  Cardiovascular: Normal rate, regular rhythm, normal heart sounds and intact distal pulses.   Exam reveals no gallop and no friction rub.   No murmur heard. Pulmonary/Chest: Effort normal and breath sounds normal. No respiratory distress. He has no wheezes. He has no rales. He exhibits no tenderness.  Neurological: He is alert and oriented to person, place, and time.  Skin: Skin is warm and dry. No rash noted. He is not diaphoretic. No erythema. No pallor.  Psychiatric: He has a normal mood and affect. His behavior is normal. Thought content normal.  Nursing note and vitals  reviewed.     Assessment & Plan:  1. Acute pansinusitis, recurrence not specified - doxycycline (VIBRAMYCIN) 100 MG capsule; Take 1 capsule (100 mg total) by mouth 2 (two) times daily.  Dispense: 20 capsule; Refill: 0 - fluticasone (FLONASE) 50 MCG/ACT nasal spray; Place 2 sprays into both nostrils daily.  Dispense: 16 g; Refill: 6 - Follow up if no improvement  - Tylenol/Motrin for symptom relief.   Dorothyann Peng, NP

## 2016-09-25 NOTE — Progress Notes (Signed)
Pre visit review using our clinic review tool, if applicable. No additional management support is needed unless otherwise documented below in the visit note. 

## 2016-10-31 ENCOUNTER — Other Ambulatory Visit: Payer: Self-pay | Admitting: Nurse Practitioner

## 2016-11-02 ENCOUNTER — Other Ambulatory Visit: Payer: Self-pay | Admitting: Adult Health

## 2016-11-02 DIAGNOSIS — J0140 Acute pansinusitis, unspecified: Secondary | ICD-10-CM

## 2016-11-02 NOTE — Telephone Encounter (Signed)
He will need to be seen if he feels as though he needs a refill of antibiotics

## 2016-11-02 NOTE — Telephone Encounter (Signed)
I called the pt and informed him of the message below and he stated he requested refills on his other medications and this must have been requested in error.  Stated he does not need an antibiotic and will call back for an appt to see Tommi Rumps if needed.

## 2016-11-03 ENCOUNTER — Ambulatory Visit (INDEPENDENT_AMBULATORY_CARE_PROVIDER_SITE_OTHER): Payer: Federal, State, Local not specified - PPO | Admitting: Adult Health

## 2016-11-03 ENCOUNTER — Encounter: Payer: Self-pay | Admitting: Adult Health

## 2016-11-03 VITALS — BP 140/82 | Temp 97.6°F | Ht 75.0 in | Wt 223.0 lb

## 2016-11-03 DIAGNOSIS — R51 Headache: Secondary | ICD-10-CM | POA: Diagnosis not present

## 2016-11-03 DIAGNOSIS — R519 Headache, unspecified: Secondary | ICD-10-CM

## 2016-11-03 LAB — BASIC METABOLIC PANEL
BUN: 18 mg/dL (ref 6–23)
CALCIUM: 9.4 mg/dL (ref 8.4–10.5)
CO2: 28 mEq/L (ref 19–32)
Chloride: 103 mEq/L (ref 96–112)
Creatinine, Ser: 1.22 mg/dL (ref 0.40–1.50)
GFR: 60.99 mL/min (ref >60.00)
GLUCOSE: 105 mg/dL — AB (ref 70–99)
POTASSIUM: 4.3 meq/L (ref 3.5–5.1)
Sodium: 137 mEq/L (ref 135–145)

## 2016-11-03 LAB — CBC
HCT: 38.9 % — ABNORMAL LOW (ref 39.0–52.0)
Hemoglobin: 13.3 g/dL (ref 13.0–17.0)
MCHC: 34.3 g/dL (ref 30.0–36.0)
MCV: 86.4 fl (ref 78.0–100.0)
Platelets: 261 10*3/uL (ref 150.0–400.0)
RBC: 4.5 Mil/uL (ref 4.22–5.81)
RDW: 14.3 % (ref 11.5–15.5)
WBC: 5.7 10*3/uL (ref 4.0–10.5)

## 2016-11-03 LAB — C-REACTIVE PROTEIN

## 2016-11-03 LAB — SEDIMENTATION RATE: SED RATE: 1 mm/h (ref 0–20)

## 2016-11-03 NOTE — Progress Notes (Signed)
Subjective:    Patient ID: Kristopher Wilson, male    DOB: [birth year], 81 y.o.   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   He presents to the office today for 4-5 weeks of worsening headache. He reports that his headaches are constant and there has not been a day over this time span where he has been headache free. The pain is bilateral frontal and is described as "pressure at times and sharp at times." He denies any blurred vision, scalp pain,  nausea, fevers, sinus pain, pressure, rhinorrhea, or impaired consciousness.  He has not been using anything OTC for his headaches.   Today the pain is 5/10.    Review of Systems  Respiratory: Negative.   Cardiovascular: Negative.   Genitourinary: Negative.   Neurological: Positive for headaches. Negative for dizziness, tremors, seizures, syncope, facial asymmetry, speech difficulty, weakness, light-headedness and numbness.  All other systems reviewed and are negative.  Past Medical History:  Diagnosis Date  . BACK PAIN, LUMBAR 10/18/2009  . Benign paroxysmal positional vertigo 09/30/2007  . CAD (coronary artery disease)   . CHF (congestive heart failure) ([place])   . CKD (chronic kidney disease)   . COLONIC POLYPS, ADENOMATOUS, HX OF   . GERD  (gastroesophageal reflux disease)   . Herpes zoster with other nervous system complications(053.19)   . Hyperlipidemia   . Hypertension   . Hyperthyroidism   . Loss of hearing    Bil/has hearing aids in both ears  . Nerve damage of left foot   . PERIPHERAL NEUROPATHY 10/11/2007  . 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 PROSTATE CANCER, HX OF 07/07/2007   s/p prostatectomy  . RLS (restless legs syndrome)   . Ulcer ([place])   . ULCERATIVE COLITIS, LEFT SIDED 2000  . URTICARIA, ALLERGIC 08/05/2009  . VENOUS INSUFFICIENCY 07/28/2010    Social History   Social History  . Marital status: Married    Spouse name: N/A  . Number of children: N/A  . Years of education: N/A   Occupational History  . retired Retired  . [employer]    Social History Main Topics  . Smoking status: Former Smoker    Types: Cigarettes    Quit date: 09/08/1975  . Smokeless tobacco: Never Used  . Alcohol use 4.2 oz/week    7 Standard drinks or equivalent per week     Comment: 1-2 "high balls" daily  . Drug use: No  . Sexual activity: Not on file   Other Topics Concern  . 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 All live in [place] area   Works part time at [employer]    Past Surgical History:  Procedure Laterality Date  . ANGIOPLASTY  1990's/2004   stent placement 2  . COLONOSCOPY W/ BIOPSIES AND POLYPECTOMY  06/06/2009   adenomatous  polyp, diverticulosis, colitis  . INGUINAL HERNIA REPAIR    . PROSTATECTOMY    . RETINAL LASER PROCEDURE Right 2016 & 2017   . ROTATOR CUFF REPAIR Left   . TONSILLECTOMY      Family History  Problem Relation Age of Onset  . Heart attack Father   . Throat cancer Brother   . Multiple sclerosis Brother   . Multiple sclerosis Son   . Diabetes Maternal Uncle     x 2  . Diabetes Maternal Uncle   . Colon cancer Neg Hx   . Stroke Neg Hx     Allergies    Allergen Reactions  . Cephalexin Hives    Current Outpatient Prescriptions on File Prior to Visit  Medication Sig Dispense Refill  . amLODipine (NORVASC) 10 MG tablet Take 1 tablet (10 mg total) by mouth daily. 90 tablet 3  . aspirin 81 MG tablet Take 81 mg by mouth daily.      Marland Kitchen atorvastatin (LIPITOR) 40 MG tablet Take 1 tablet daily (Patient taking differently: Take 40 mg by mouth daily. ) 90 tablet 3  . carvedilol (COREG) 12.5 MG tablet TAKE 1 TABLET TWICE A DAY  WITH MEALS (Patient taking differently: Take 25 mg by mouth 2 (two) times daily with a meal. TAKE 1 TABLET TWICE A DAY  WITH MEALS) 30 tablet 0  . carvedilol (COREG) 12.5 MG tablet TAKE 1 TABLET TWICE DAILY  WITH MEALS 180 tablet 1  . clopidogrel (PLAVIX) 75 MG tablet Take 1 tablet (75 mg total) by mouth daily. 90 tablet 3  . doxycycline (VIBRAMYCIN) 100 MG capsule Take 1 capsule (100 mg total) by mouth 2 (two) times daily. 20 capsule 0  . fluticasone (FLONASE) 50 MCG/ACT nasal spray Place 2 sprays into both nostrils daily. 16 g 6  . furosemide (LASIX) 20 MG tablet Take 1 tablet (20 mg total) by mouth daily. 30 tablet 3  . isosorbide mononitrate (IMDUR) 30 MG 24 hr tablet TAKE 1 TABLET BY MOUTH EVERY DAY 30 tablet 2  . meclizine (ANTIVERT) 12.5 MG tablet TAKE 1 TABLET (12.5 MG TOTAL) BY MOUTH 2 (TWO) TIMES DAILY AS NEEDED FOR DIZZINESS. 30 tablet 0  . Mesalamine 800 MG TBEC TAKE 2 TABLETS (=1,600MG ) 3TIMES A DAY 540 tablet 1  . nitroGLYCERIN (NITROSTAT) 0.4 MG SL tablet DISSOLVE 1 TABLET UNDER THETONGUE EVERY 5 MINUTES AS  NEEDED (Patient taking differently: Place 0.4 mg under the tongue every 5 (five) minutes as needed for chest pain. ) 25 tablet 1  . pantoprazole (PROTONIX) 40 MG tablet Take 1 tablet (40 mg total) by mouth daily. 30 tablet 0  . pramipexole (MIRAPEX) 1 MG tablet Take 1 tablet at bedtime for Restless Leg Syndrome. (Patient taking differently: Take 1 mg by mouth at bedtime. ) 90 tablet 3   No current  facility-administered medications on file prior to visit.     BP 140/82   Temp 97.6 F (36.4 C) (Oral)   Ht 6\' 3"  (1.905 m)   Wt 223 lb (101.2 kg)   BMI 27.87 kg/m       Objective:   Physical Exam  Constitutional: He is oriented to person, place, and time. He appears well-developed and well-nourished. No distress.  HENT:  Head: Normocephalic and atraumatic.  Right Ear: External ear normal.  Left Ear: External ear normal.  Nose: Nose normal.  Mouth/Throat: Oropharynx is clear and moist. No oropharyngeal exudate.  Eyes: Conjunctivae and EOM are normal. Pupils are equal, round, and reactive  to light. Right eye exhibits no discharge. Left eye exhibits no discharge. No scleral icterus.  Neck: Normal range of motion. Neck supple. No thyromegaly present.  Cardiovascular: Normal rate, regular rhythm, normal heart sounds and intact distal pulses.  Exam reveals no gallop and no friction rub.   No murmur heard. Pulmonary/Chest: Effort normal and breath sounds normal.  Neurological: He is alert and oriented to person, place, and time.  Skin: Skin is warm and dry. No rash noted. He is not diaphoretic. No erythema. No pallor.  Psychiatric: He has a normal mood and affect. His behavior is normal. Judgment and thought content normal.  Nursing note and vitals reviewed.     Assessment & Plan:  1. Acute intractable headache, unspecified headache type - Headache did improve with high flow oxygen. Due to age and no prior history of headache I will get lab work and MRA/MRI.  - Basic metabolic panel; Future - CBC - MR Brain Wo Contrast; Future - Sedimentation Rate - C-reactive Protein - MR Angiogram Head Wo Contrast; Future - go to the ER with worsening headache or blurred vision   Dorothyann Peng, NP

## 2016-11-03 NOTE — Addendum Note (Signed)
Addended by: Tomi Likens on: 11/03/2016 10:54 AM   Modules accepted: Orders

## 2016-11-10 ENCOUNTER — Telehealth: Payer: Self-pay

## 2016-11-10 NOTE — Telephone Encounter (Signed)
Patient was seen 11/03/16 and had some lab work - would you mind resulting these? Thanks!

## 2016-11-10 NOTE — Telephone Encounter (Signed)
Results were released to mychart.   Everything is normal

## 2016-11-10 NOTE — Telephone Encounter (Signed)
Patient notified of these lab results and verbalized understanding.

## 2016-11-12 ENCOUNTER — Ambulatory Visit
Admission: RE | Admit: 2016-11-12 | Discharge: 2016-11-12 | Disposition: A | Discharge status: Home / Self Care | Payer: Federal, State, Local not specified - PPO | Source: Ambulatory Visit | Attending: Adult Health | Admitting: Adult Health

## 2016-11-12 DIAGNOSIS — R51 Headache: Principal | ICD-10-CM

## 2016-11-12 DIAGNOSIS — R519 Headache, unspecified: Secondary | ICD-10-CM

## 2016-11-16 ENCOUNTER — Telehealth: Payer: Self-pay | Admitting: Adult Health

## 2016-11-16 NOTE — Telephone Encounter (Signed)
Pt need MRI results

## 2016-11-16 NOTE — Telephone Encounter (Signed)
Pt would like to have his MRI from 11/12/16.

## 2016-11-17 ENCOUNTER — Other Ambulatory Visit: Payer: Self-pay | Admitting: Adult Health

## 2016-11-17 MED ORDER — AMOXICILLIN-POT CLAVULANATE 875-125 MG PO TABS: 1.0000 | ORAL_TABLET | Freq: Two times a day (BID) | ORAL | 0 refills | Status: DC

## 2016-11-17 NOTE — Telephone Encounter (Signed)
Patient notified of MRI results.

## 2016-11-25 ENCOUNTER — Telehealth: Payer: Self-pay | Admitting: Adult Health

## 2016-11-25 ENCOUNTER — Other Ambulatory Visit: Payer: Self-pay | Admitting: Adult Health

## 2016-11-25 MED ORDER — LEVOFLOXACIN 500 MG PO TABS: 500.0000 mg | ORAL_TABLET | Freq: Every day | ORAL | 0 refills | Status: DC

## 2016-11-25 NOTE — Telephone Encounter (Signed)
Pt state that he is still have dull headaches and would like to see if he can get a refill on amoxicillin-clavulanate  Pharm:  [facility]

## 2016-11-25 NOTE — Telephone Encounter (Signed)
Patient Name: Kristopher Wilson  DOB: [birth year]    Initial Comment Caller states that he has gotten a medication with a lot of warnings, and has concerns about the medication due to it having warnings against taking with water pills, blood thinners, and nerve damage.    Nurse Assessment  Nurse: Jimmey Ralph, RN, Lissa Date/Time (Eastern Time): 11/25/2016 4:41:58 PM  Confirm and document reason for call. If symptomatic, describe symptoms. ---Caller states that he has gotten a medication with a lot of warnings, and has concerns about the medication due to it having warnings against taking with water pills, blood thinners, and nerve damage. Levofloxacin/Levaquin prescribed I have alot of severe headaches and sinus infection. No fever but still having some headaches.  Does the patient have any new or worsening symptoms? ---Yes  Will a triage be completed? ---Yes  Related visit to physician within the last 2 weeks? ---Yes  Does the PT have any chronic conditions? (i.e. diabetes, asthma, etc.) ---Yes  List chronic conditions. ---Nerve damage in left foot and leg HTN/ Water pill for swelling in ankles  Is this a behavioral health or substance abuse call? ---No     Guidelines    Guideline Title Affirmed Question Affirmed Notes  Sinus Infection on Antibiotic Follow-up Call [1] Reasonable improvement on antibiotic AND [2] no fever or pain (all triage questions negative)    Final Disposition User   [facility], RN, Lissa    Comments  Just a mild/dull headache that is persistant.  Caller is not going to take the antibiotic Levaquin. [facility] is the pharmacy caller uses.  Allergies: caller states allergy is listed in his chart.   Referrals  REFERRED TO PCP OFFICE

## 2016-11-25 NOTE — Telephone Encounter (Signed)
Patient notified and verbalized understanding. 

## 2016-11-25 NOTE — Telephone Encounter (Signed)
Please advise 

## 2016-11-25 NOTE — Telephone Encounter (Signed)
I am going to send in a dose of a different antibiotic called Levaquin. This is stronger than Amoxicillin. Please take a probiotic with this.   If this does not clear it up completley, have him come back in

## 2016-11-25 NOTE — Telephone Encounter (Signed)
Has he had any improvement ?

## 2016-11-25 NOTE — Telephone Encounter (Signed)
I contacted patient. He states that he is feeling a little better.  He states that he is still having headaches though - he states they are not as bad as they were, but that still bother him quite a bit.

## 2016-11-26 NOTE — Telephone Encounter (Signed)
Care Advice Given Per Guideline HOME CARE: You should be able to treat this at home. BACTERIAL SINUSITIS: ANTIBIOTIC: Continue taking the prescribed antibiotic. FOR A STUFFY NOSE - USE NASAL WASHES: CALL BACK IF: * You become worse. CARE ADVICE given per Sinus Infection on Antibiotic Follow-Up Call (Adult) guideline.

## 2016-11-27 ENCOUNTER — Telehealth: Payer: Self-pay | Admitting: Adult Health

## 2016-11-27 ENCOUNTER — Other Ambulatory Visit: Payer: Self-pay

## 2016-11-27 MED ORDER — AMOXICILLIN-POT CLAVULANATE 875-125 MG PO TABS: 1.0000 | ORAL_TABLET | Freq: Two times a day (BID) | ORAL | 0 refills | Status: DC

## 2016-11-27 NOTE — Telephone Encounter (Signed)
Please advise 

## 2016-11-27 NOTE — Telephone Encounter (Signed)
Augmentin BID x 7 days

## 2016-11-27 NOTE — Telephone Encounter (Signed)
Patient notified of new Rx being sent in to pharmacy. Patient verbalized understanding.

## 2016-11-27 NOTE — Telephone Encounter (Signed)
° ° ° °  Pt call to say the below med has to many side effects and he is not willing to take and ask if another antibitoc can be called in with less side affects    levofloxacin (LEVAQUIN) 500 MG tablet

## 2016-12-10 ENCOUNTER — Ambulatory Visit: Payer: Federal, State, Local not specified - PPO | Admitting: Physical Medicine & Rehabilitation

## 2016-12-29 ENCOUNTER — Other Ambulatory Visit: Payer: Self-pay | Admitting: Adult Health

## 2016-12-30 ENCOUNTER — Other Ambulatory Visit: Payer: Self-pay | Admitting: Adult Health

## 2016-12-31 NOTE — Telephone Encounter (Signed)
Okay to refill for 1 year. 

## 2017-01-27 ENCOUNTER — Other Ambulatory Visit: Payer: Self-pay | Admitting: Nurse Practitioner

## 2017-02-19 ENCOUNTER — Other Ambulatory Visit: Payer: Self-pay | Admitting: Family Medicine

## 2017-02-22 NOTE — Telephone Encounter (Signed)
Pt needs pantoprazole 40 mg #90. rx was routed to dr todd instead of cory

## 2017-02-22 NOTE — Telephone Encounter (Signed)
Ok to refill for one year  

## 2017-02-24 ENCOUNTER — Ambulatory Visit (INDEPENDENT_AMBULATORY_CARE_PROVIDER_SITE_OTHER): Payer: Federal, State, Local not specified - PPO | Admitting: Nurse Practitioner

## 2017-02-24 ENCOUNTER — Encounter: Payer: Self-pay | Admitting: Nurse Practitioner

## 2017-02-24 VITALS — BP 160/90 | HR 66 | Ht 75.0 in | Wt 218.8 lb

## 2017-02-24 DIAGNOSIS — R0789 Other chest pain: Secondary | ICD-10-CM

## 2017-02-24 DIAGNOSIS — I10 Essential (primary) hypertension: Secondary | ICD-10-CM

## 2017-02-24 DIAGNOSIS — E7800 Pure hypercholesterolemia, unspecified: Secondary | ICD-10-CM

## 2017-02-24 DIAGNOSIS — I2510 Atherosclerotic heart disease of native coronary artery without angina pectoris: Secondary | ICD-10-CM

## 2017-02-24 MED ORDER — ISOSORBIDE MONONITRATE ER 60 MG PO TB24: 60.0000 mg | ORAL_TABLET | Freq: Every day | ORAL | 3 refills | Status: DC

## 2017-02-24 NOTE — Patient Instructions (Addendum)
We will be checking the following labs today - NONE  BMET, HPF, CBC and lipids tomorrow   Medication Instructions:    Continue with your current medicines. BUT  I want to increase the Imdur to 60 mg a day - you can take 2 of your 30 mg tablets and use up - the RX for the 60 mg is at your drug store.     Testing/Procedures To Be Arranged:  N/A  Follow-Up:   See me in one month    Other Special Instructions:   Ok to start walking - start out slow - add a minute a day    If you need a refill on your cardiac medications before your next appointment, please call your pharmacy.   Call the [place] office at [phone] if you have any questions, problems or concerns.

## 2017-02-24 NOTE — Progress Notes (Signed)
CARDIOLOGY OFFICE NOTE  Date:  02/24/2017    Kristopher Wilson Date of Birth: 06/14/1936 Medical Record #[id number]  PCP:  Kristopher Peng, NP  Cardiologist:  Annabell Howells    Chief Complaint  Patient presents with  . Coronary Artery Disease    Follow up visit - former patient of Dr. Claris Gladden    History of Present Illness: Kristopher Wilson is a 81 y.o. male who presents today for a follow up visit.  Seen for Dr. Aundra Dubin.   He has a history of CAD, ulcerative colitis, and shingles with post-herpetic neuralgia. Patient had PCI in 2000 with BMS to LAD and repeat PCI in 2009 with DES to PLV. ETT-myoview in 3/16 showed no evidence for ischemia or infarction. His activity has been limited by a left leg neuropathy that has been thought to be due to post-herpetic neuralgia. He has been seen by neurology for this. He wears an orthotic on the left leg because of foot drop.He remains on aspirin and Plavix.   Last seen by Dr. [name] 05/2012.   Saw Richardson Dopp PA in December of 2015 for surgical clearance for L shoulder rotator cuff surgery with Dr. Durward Fortes 09/13/14.He was doing well.  I saw him back in March of 2016 - he was having atypical chest pain. BP was up. We updated his Myoview - this turned out ok. He was going to monitor his BP at home. When seen back for follow up - he was doing well. BP improved. Really likes his salt. His BP cuff was about 10 points higher than here in the office.   I saw him back in October of 2016 - was having chest pain - difficult to sort out. Still getting too much salt. Since he had had a recent Myoview - I placed him on Imdur and we elected to follow medically. He wanted to try and be managed medically. He was improved at his follow up in November. I last saw him in March of 2017 - was doing well with no real issue.   Comes back today. Here alone. Doing ok. But notes over the past 5 to 6 weeks his chest pain has returned - no NTG use.  Says it is like what he had a few years ago when I added the Imdur. Typically occurring at rest - not with exertion. Will last about 10 to 15 seconds - at least less than a minute. Described as more of a sharp sensation. He wants to start going to the gym and working on losing weight but thought he should come here first and get checked out.  Nothing he can do to aggravate but resolves with deep breathing. No palpitations. Has had some issues with sinus infections. Does not sleep well at night. Some DOE if he really exerts - this seems unchanged.   Past Medical History:  Diagnosis Date  . BACK PAIN, LUMBAR 10/18/2009  . Benign paroxysmal positional vertigo 09/30/2007  . CAD (coronary artery disease)   . CHF (congestive heart failure) ([place])   . CKD (chronic kidney disease)   . COLONIC POLYPS, ADENOMATOUS, HX OF   . GERD (gastroesophageal reflux disease)   . Herpes zoster with other nervous system complications(053.19)   . Hyperlipidemia   . Hypertension   . Hyperthyroidism   . Loss of hearing    Bil/has hearing aids in both ears  . Nerve damage of left foot   . PERIPHERAL NEUROPATHY 10/11/2007  . POSTHERPETIC  NEURALGIA 07/03/2010  . PROSTATE CANCER, HX OF 07/07/2007   s/p prostatectomy  . RLS (restless legs syndrome)   . Ulcer   . ULCERATIVE COLITIS, LEFT SIDED 2000  . URTICARIA, ALLERGIC 08/05/2009  . VENOUS INSUFFICIENCY 07/28/2010    Past Surgical History:  Procedure Laterality Date  . ANGIOPLASTY  1990's/2004   stent placement 2  . COLONOSCOPY W/ BIOPSIES AND POLYPECTOMY  06/06/2009   adenomatous polyp, diverticulosis, colitis  . INGUINAL HERNIA REPAIR    . PROSTATECTOMY    . RETINAL LASER PROCEDURE Right 2016 & 2017   . ROTATOR CUFF REPAIR Left   . TONSILLECTOMY       Medications: Current Outpatient Prescriptions  Medication Sig Dispense Refill  . aspirin 81 MG tablet Take 81 mg by mouth daily.      Marland Kitchen atorvastatin (LIPITOR) 40 MG tablet Take 40 mg by mouth daily.    .  carvedilol (COREG) 12.5 MG tablet Take 12.5 mg by mouth 2 (two) times daily with a meal.    . clopidogrel (PLAVIX) 75 MG tablet Take 1 tablet (75 mg total) by mouth daily. 90 tablet 3  . meclizine (ANTIVERT) 12.5 MG tablet TAKE 1 TABLET (12.5 MG TOTAL) BY MOUTH 2 (TWO) TIMES DAILY AS NEEDED FOR DIZZINESS. 30 tablet 0  . Mesalamine 800 MG TBEC TAKE 2 TABLETS (=1,600MG ) 3TIMES A DAY 540 tablet 1  . nitroGLYCERIN (NITROSTAT) 0.4 MG SL tablet Place 0.4 mg under the tongue every 5 (five) minutes as needed for chest pain.    . pantoprazole (PROTONIX) 40 MG tablet Take 1 tablet (40 mg total) by mouth daily. 30 tablet 0  . pramipexole (MIRAPEX) 1 MG tablet Take 1 tablet (1 mg total) by mouth at bedtime. 90 tablet 3  . isosorbide mononitrate (IMDUR) 60 MG 24 hr tablet Take 1 tablet (60 mg total) by mouth daily. 90 tablet 3   No current facility-administered medications for this visit.     Allergies: Allergies  Allergen Reactions  . Cephalexin Hives    Social History: The patient  reports that he quit smoking about 41 years ago. His smoking use included Cigarettes. He has never used smokeless tobacco. He reports that he drinks about 4.2 oz of alcohol per week . He reports that he does not use drugs.   Family History: The patient's family history includes Diabetes in his maternal uncle and maternal uncle; Heart attack in his father; Multiple sclerosis in his brother and son; Throat cancer in his brother.   Review of Systems: Please see the history of present illness.   Otherwise, the review of systems is positive for none.   All other systems are reviewed and negative.   Physical Exam: VS:  BP (!) 160/90 (BP Location: Left Arm, Patient Position: Sitting, Cuff Size: Normal)   Pulse 66   Ht 6\' 3"  (1.905 m)   Wt 218 lb 12.8 oz (99.2 kg)   SpO2 92% Comment: at rest  BMI 27.35 kg/m  .  BMI Body mass index is 27.35 kg/m.  Wt Readings from Last 3 Encounters:  02/24/17 218 lb 12.8 oz (99.2 kg)    11/03/16 223 lb (101.2 kg)  09/25/16 225 lb (102.1 kg)   BP is 140/80 by my recheck.  General: Pleasant. Well developed, well nourished and in no acute distress.  He has lost weight.  HEENT: Normal.  Neck: Supple, no JVD, carotid bruits, or masses noted.  Cardiac: Regular rate and rhythm. No murmurs, rubs, or gallops. No edema.  Respiratory:  Lungs are clear to auscultation bilaterally with normal work of breathing.  GI: Soft and nontender.  MS: No deformity or atrophy. Gait and ROM intact.  Skin: Warm and dry. Color is normal.  Neuro:  Strength and sensation are intact and no gross focal deficits noted.  Psych: Alert, appropriate and with normal affect.   LABORATORY DATA:  EKG:  EKG is ordered today. This shows NSR  Lab Results  Component Value Date   WBC 5.7 11/03/2016   HGB 13.3 11/03/2016   HCT 38.9 (L) 11/03/2016   PLT 261.0 11/03/2016   GLUCOSE 105 (H) 11/03/2016   CHOL 162 01/30/2016   TRIG 221.0 (H) 01/30/2016   HDL 42.50 01/30/2016   LDLDIRECT 68.0 01/30/2016   LDLCALC 64 11/20/2015   ALT 21 01/30/2016   AST 17 01/30/2016   NA 137 11/03/2016   K 4.3 11/03/2016   CL 103 11/03/2016   CREATININE 1.22 11/03/2016   BUN 18 11/03/2016   CO2 28 11/03/2016   TSH 2.17 01/30/2016   PSA 0.21 01/30/2016   INR 0.9 02/29/2008   HGBA1C 5.7 10/11/2007     BNP (last 3 results) No results for input(s): BNP in the last 8760 hours.  ProBNP (last 3 results)  Recent Labs  04/21/16 1704  PROBNP 28.0     Other Studies Reviewed Today:  Myoview Impression from 11/2014 Exercise Capacity: Fair exercise capacity. BP Response: Hypertensive blood pressure response. Clinical Symptoms: No chest pain. ECG Impression: No significant ST segment change suggestive of ischemia. Comparison with Prior Nuclear Study: Previous scan is normal    Overall Impression: Normal stress nuclear study.  LV Ejection Fraction: 70%. LV Wall Motion: NL LV Function; NL Wall Motion    Dorris Carnes    Assessment/Plan:  1. Chest pain - has known CAD with remote PCI - last Myoview was normal. He is managed medically - symptoms are somewhat atypical - will increase his Imdur - I would like him to try and start some light exercise. I will see back to check on him.   2. Coronary Artery Disease: Continue ASA, Plavix, beta blocker, statin.   3. Hypertension: BP better by my recheck.   4. Hyperlipidemia: Continue statin. Lab tomorrow - he is not fasting today.  Current medicines are reviewed with the patient today.  The patient does not have concerns regarding medicines other than what has been noted above.  The following changes have been made:  See above.  Labs/ tests ordered today include:    Orders Placed This Encounter  Procedures  . Basic metabolic panel  . Hepatic function panel  . Lipid panel  . CBC  . EKG 12-Lead     Disposition:   FU with me in about 4 weeks for recheck.   Patient is agreeable to this plan and will call if any problems develop in the interim.   SignedTruitt Merle, NP  02/24/2017 8:54 AM  [facility] [address] [place], [place]  [address] Phone: [phone] Fax: [phone]

## 2017-02-25 ENCOUNTER — Other Ambulatory Visit: Payer: Federal, State, Local not specified - PPO | Admitting: *Deleted

## 2017-02-25 DIAGNOSIS — E7800 Pure hypercholesterolemia, unspecified: Secondary | ICD-10-CM

## 2017-02-25 DIAGNOSIS — I10 Essential (primary) hypertension: Secondary | ICD-10-CM

## 2017-02-25 DIAGNOSIS — R0789 Other chest pain: Secondary | ICD-10-CM

## 2017-02-25 DIAGNOSIS — I2510 Atherosclerotic heart disease of native coronary artery without angina pectoris: Secondary | ICD-10-CM

## 2017-02-25 LAB — BASIC METABOLIC PANEL
BUN/Creatinine Ratio: 14 (ref 10–24)
BUN: 17 mg/dL (ref 8–27)
CO2: 23 mmol/L (ref 20–29)
Calcium: 9.3 mg/dL (ref 8.6–10.2)
Chloride: 99 mmol/L (ref 96–106)
Creatinine, Ser: 1.23 mg/dL (ref 0.76–1.27)
GFR calc Af Amer: 65 mL/min/{1.73_m2} (ref >59)
GFR calc non Af Amer: 56 mL/min/{1.73_m2} — ABNORMAL LOW (ref >59)
Glucose: 108 mg/dL — ABNORMAL HIGH (ref 65–99)
Potassium: 4.4 mmol/L (ref 3.5–5.2)
Sodium: 138 mmol/L (ref 134–144)

## 2017-02-25 LAB — CBC WITH DIFFERENTIAL/PLATELET
Basophils Absolute: 0 10*3/uL (ref 0.0–0.2)
Basos: 1 %
EOS (ABSOLUTE): 0.3 10*3/uL (ref 0.0–0.4)
Eos: 4 %
Hematocrit: 38.6 % (ref 37.5–51.0)
Hemoglobin: 13.6 g/dL (ref 13.0–17.7)
Lymphocytes Absolute: 2.3 10*3/uL (ref 0.7–3.1)
Lymphs: 27 %
MCH: 29.2 pg (ref 26.6–33.0)
MCHC: 35.2 g/dL (ref 31.5–35.7)
MCV: 83 fL (ref 79–97)
Monocytes Absolute: 0.7 10*3/uL (ref 0.1–0.9)
Monocytes: 8 %
Neutrophils Absolute: 5.3 10*3/uL (ref 1.4–7.0)
Neutrophils: 60 %
Platelets: 238 10*3/uL (ref 150–379)
RBC: 4.65 x10E6/uL (ref 4.14–5.80)
RDW: 15 % (ref 12.3–15.4)
WBC: 8.7 10*3/uL (ref 3.4–10.8)

## 2017-02-25 LAB — LIPID PANEL
Chol/HDL Ratio: 6.4 ratio — ABNORMAL HIGH (ref 0.0–5.0)
Cholesterol, Total: 255 mg/dL — ABNORMAL HIGH (ref 100–199)
HDL: 40 mg/dL (ref >39)
Triglycerides: 601 mg/dL (ref 0–149)

## 2017-02-25 LAB — HEPATIC FUNCTION PANEL
ALT: 20 IU/L (ref 0–44)
AST: 14 IU/L (ref 0–40)
Albumin: 4.1 g/dL (ref 3.5–4.8)
Alkaline Phosphatase: 90 IU/L (ref 39–117)
Bilirubin Total: 0.3 mg/dL (ref 0.0–1.2)
Bilirubin, Direct: 0.09 mg/dL (ref 0.00–0.40)
Total Protein: 6 g/dL (ref 6.0–8.5)

## 2017-02-25 NOTE — Addendum Note (Signed)
Addended by: Eulis Foster on: 02/25/2017 04:32 PM   Modules accepted: Orders

## 2017-02-25 NOTE — Addendum Note (Signed)
Addended by: Eulis Foster on: 02/25/2017 12:09 PM   Modules accepted: Orders

## 2017-02-25 NOTE — Addendum Note (Signed)
Addended by: Eulis Foster on: 02/25/2017 12:08 PM   Modules accepted: Orders

## 2017-02-25 NOTE — Progress Notes (Signed)
cbcd

## 2017-02-26 ENCOUNTER — Emergency Department (HOSPITAL_COMMUNITY)
Admission: EM | Admit: 2017-02-26 | Discharge: 2017-02-26 | Disposition: A | Discharge status: Home / Self Care | Payer: Federal, State, Local not specified - PPO | Attending: Emergency Medicine | Admitting: Emergency Medicine

## 2017-02-26 ENCOUNTER — Encounter (HOSPITAL_COMMUNITY): Payer: Self-pay | Admitting: Emergency Medicine

## 2017-02-26 ENCOUNTER — Emergency Department (HOSPITAL_COMMUNITY): Payer: Federal, State, Local not specified - PPO

## 2017-02-26 ENCOUNTER — Other Ambulatory Visit: Payer: Self-pay | Admitting: Nurse Practitioner

## 2017-02-26 DIAGNOSIS — Z79899 Other long term (current) drug therapy: Secondary | ICD-10-CM | POA: Diagnosis not present

## 2017-02-26 DIAGNOSIS — E875 Hyperkalemia: Secondary | ICD-10-CM | POA: Insufficient documentation

## 2017-02-26 DIAGNOSIS — I129 Hypertensive chronic kidney disease with stage 1 through stage 4 chronic kidney disease, or unspecified chronic kidney disease: Secondary | ICD-10-CM | POA: Insufficient documentation

## 2017-02-26 DIAGNOSIS — Y33XXXA Other specified events, undetermined intent, initial encounter: Secondary | ICD-10-CM | POA: Insufficient documentation

## 2017-02-26 DIAGNOSIS — S82855A Nondisplaced trimalleolar fracture of left lower leg, initial encounter for closed fracture: Secondary | ICD-10-CM | POA: Insufficient documentation

## 2017-02-26 DIAGNOSIS — I110 Hypertensive heart disease with heart failure: Secondary | ICD-10-CM | POA: Insufficient documentation

## 2017-02-26 DIAGNOSIS — Y9353 Activity, golf: Secondary | ICD-10-CM | POA: Diagnosis not present

## 2017-02-26 DIAGNOSIS — N189 Chronic kidney disease, unspecified: Secondary | ICD-10-CM | POA: Diagnosis not present

## 2017-02-26 DIAGNOSIS — I2510 Atherosclerotic heart disease of native coronary artery without angina pectoris: Secondary | ICD-10-CM | POA: Diagnosis not present

## 2017-02-26 DIAGNOSIS — Z8546 Personal history of malignant neoplasm of prostate: Secondary | ICD-10-CM | POA: Insufficient documentation

## 2017-02-26 DIAGNOSIS — Z87891 Personal history of nicotine dependence: Secondary | ICD-10-CM | POA: Diagnosis not present

## 2017-02-26 DIAGNOSIS — Z7982 Long term (current) use of aspirin: Secondary | ICD-10-CM | POA: Diagnosis not present

## 2017-02-26 DIAGNOSIS — I509 Heart failure, unspecified: Secondary | ICD-10-CM | POA: Diagnosis not present

## 2017-02-26 DIAGNOSIS — E0590 Thyrotoxicosis, unspecified without thyrotoxic crisis or storm: Secondary | ICD-10-CM | POA: Diagnosis not present

## 2017-02-26 DIAGNOSIS — Y9239 Other specified sports and athletic area as the place of occurrence of the external cause: Secondary | ICD-10-CM | POA: Diagnosis not present

## 2017-02-26 DIAGNOSIS — Y998 Other external cause status: Secondary | ICD-10-CM | POA: Insufficient documentation

## 2017-02-26 DIAGNOSIS — S82852A Displaced trimalleolar fracture of left lower leg, initial encounter for closed fracture: Secondary | ICD-10-CM

## 2017-02-26 DIAGNOSIS — S99912A Unspecified injury of left ankle, initial encounter: Secondary | ICD-10-CM | POA: Diagnosis present

## 2017-02-26 MED ORDER — MORPHINE SULFATE 15 MG PO TABS: 15.0000 mg | ORAL_TABLET | ORAL | 0 refills | Status: DC | PRN

## 2017-02-26 MED ORDER — LIDOCAINE-EPINEPHRINE 1 %-1:100000 IJ SOLN
20.0000 mL | Freq: Once | INTRAMUSCULAR | Status: AC
Start: 2017-02-26 — End: 2017-02-26
  Administered 2017-02-26: 20 mL via INTRADERMAL
  Filled 2017-02-26: qty 20

## 2017-02-26 MED ORDER — FENTANYL CITRATE (PF) 100 MCG/2ML IJ SOLN
100.0000 ug | Freq: Once | INTRAMUSCULAR | Status: AC
  Administered 2017-02-26: 100 ug via INTRAVENOUS
  Filled 2017-02-26: qty 2

## 2017-02-26 NOTE — Progress Notes (Signed)
Orthopedic Tech Progress Note Patient Details:  Kristopher Wilson November 28, 1935 [id number]  Ortho Devices Type of Ortho Device: Short leg splint, Crutches Ortho Device/Splint Location: applied short leg splint to pt left leg.  post with stirrup.  pt tolerated very well. provided trained pt for crutch use.  pt ambulated well with crutches.  left ankle.  Wife at bedside.   Ortho Device/Splint Interventions: Application, Adjustment   Kristopher Oppenheim 02/26/2017, 1:41 PM

## 2017-02-26 NOTE — ED Provider Notes (Addendum)
[facility] DEPT Provider Note   CSN: [id number] Arrival date & time: 02/26/17  1144     History   Chief Complaint Chief Complaint  Patient presents with  . Ankle Pain    HPI Kristopher Wilson is a 81 y.o. male.  81 yo M with a chief complaint of left ankle pain. The patient was playing golf today and stepped into a hole. Had an inversion of his ankle. Pain worse to the lateral aspect had significant swelling and inability to bear weight. Denies other injury.   The history is provided by the patient.  Ankle Pain   The incident occurred 1 to 2 hours ago. Incident location: golf course. Injury mechanism: inversion. The pain is present in the left ankle. The quality of the pain is described as aching and sharp. The pain is at a severity of 7/10. The pain is moderate. The pain has been constant since onset. Associated symptoms include inability to bear weight. He reports no foreign bodies present. Nothing aggravates the symptoms. He has tried nothing for the symptoms. The treatment provided no relief.    Past Medical History:  Diagnosis Date  . BACK PAIN, LUMBAR 10/18/2009  . Benign paroxysmal positional vertigo 09/30/2007  . CAD (coronary artery disease)   . CHF (congestive heart failure) ([place])   . CKD (chronic kidney disease)   . COLONIC POLYPS, ADENOMATOUS, HX OF   . GERD (gastroesophageal reflux disease)   . Herpes zoster with other nervous system complications(053.19)   . Hyperlipidemia   . Hypertension   . Hyperthyroidism   . Loss of hearing    Bil/has hearing aids in both ears  . Nerve damage of left foot   . PERIPHERAL NEUROPATHY 10/11/2007  . 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 PROSTATE CANCER, HX OF 07/07/2007   s/p prostatectomy  . RLS (restless legs syndrome)   . Ulcer   . ULCERATIVE COLITIS, LEFT SIDED 2000  . URTICARIA, ALLERGIC 08/05/2009  . VENOUS INSUFFICIENCY 07/28/2010    Patient Active Problem List   Diagnosis Date Noted  . Routine general medical  examination at a health care facility 02/06/2016  . Lumbosacral radiculopathy due to herpes zoster 12/10/2015  . Neck pain on right side 07/22/2015  . Long term current use of antithrombotics/antiplatelets - clopidogrel and ASA 03/28/2015  . Left shoulder pain 05/31/2014  . Restless leg syndrome 11/14/2013  . Night sweats 09/12/2013  . Hyperlipidemia 12/18/2010  . Pain in the chest 08/11/2010  . VENOUS INSUFFICIENCY 07/28/2010  . 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 ULCERATIVE COLITIS, LEFT SIDED 12/27/2009  . COLONIC POLYPS, ADENOMATOUS, HX OF 12/27/2009  . BACK PAIN, LUMBAR 10/18/2009  . GERD 04/18/2008  . BENIGN PAROXYSMAL POSITIONAL VERTIGO 09/30/2007  . Essential hypertension 07/07/2007  . CORONARY ARTERY DISEASE 07/07/2007  . PROSTATE CANCER, HX OF 07/07/2007    Past Surgical History:  Procedure Laterality Date  . ANGIOPLASTY  1990's/2004   stent placement 2  . COLONOSCOPY W/ BIOPSIES AND POLYPECTOMY  06/06/2009   adenomatous polyp, diverticulosis, colitis  . INGUINAL HERNIA REPAIR    . PROSTATECTOMY    . RETINAL LASER PROCEDURE Right 2016 & 2017   . ROTATOR CUFF REPAIR Left   . TONSILLECTOMY         Home Medications    Prior to Admission medications   Medication Sig Start Date End Date Taking? Authorizing Provider  aspirin 81 MG tablet Take 81 mg by mouth daily.     Yes [provider]  atorvastatin (LIPITOR) 40 MG tablet Take 40 mg by mouth daily.   Yes [provider]  carvedilol (COREG) 12.5 MG tablet Take 12.5 mg by mouth 2 (two) times daily with a meal.   Yes [provider]  clopidogrel (PLAVIX) 75 MG tablet Take 1 tablet (75 mg total) by mouth daily. 01/21/16  Yes Nafziger, Tommi Rumps, NP  isosorbide mononitrate (IMDUR) 60 MG 24 hr tablet Take 1 tablet (60 mg total) by mouth daily. 02/24/17 05/25/17 Yes Burtis Junes, NP  meclizine (ANTIVERT) 12.5 MG tablet TAKE 1 TABLET (12.5 MG TOTAL) BY MOUTH 2 (TWO) TIMES DAILY AS NEEDED FOR  DIZZINESS. 05/08/16  Yes Nafziger, Tommi Rumps, NP  Mesalamine 800 MG TBEC TAKE 2 TABLETS (=1,600MG ) 3TIMES A DAY 08/21/16  Yes Nafziger, Tommi Rumps, NP  pantoprazole (PROTONIX) 40 MG tablet Take 1 tablet (40 mg total) by mouth daily. 05/30/15  Yes Dorena Cookey, MD  pramipexole (MIRAPEX) 1 MG tablet Take 1 tablet (1 mg total) by mouth at bedtime. 12/31/16  Yes Nafziger, Tommi Rumps, NP  morphine (MSIR) 15 MG tablet Take 1 tablet (15 mg total) by mouth every 4 (four) hours as needed for severe pain. 02/26/17   Deno Etienne, DO  nitroGLYCERIN (NITROSTAT) 0.4 MG SL tablet Place 0.4 mg under the tongue every 5 (five) minutes as needed for chest pain.    [provider]    Family History Family History  Problem Relation Age of Onset  . Heart attack Father   . Throat cancer Brother   . Multiple sclerosis Brother   . Multiple sclerosis Son   . Diabetes Maternal Uncle        x 2  . Diabetes Maternal Uncle   . Colon cancer Neg Hx   . Stroke Neg Hx     Social History Social History  Substance Use Topics  . Smoking status: Former Smoker    Types: Cigarettes    Quit date: 09/08/1975  . Smokeless tobacco: Never Used  . Alcohol use 9.0 oz/week    15 Standard drinks or equivalent per week     Comment: 1-2 "high balls" daily     Allergies   Cephalexin   Review of Systems Review of Systems  Constitutional: Negative for chills and fever.  HENT: Negative for congestion and facial swelling.   Eyes: Negative for discharge and visual disturbance.  Respiratory: Negative for shortness of breath.   Cardiovascular: Negative for chest pain and palpitations.  Gastrointestinal: Negative for abdominal pain, diarrhea and vomiting.  Musculoskeletal: Positive for arthralgias and myalgias.  Skin: Negative for color change and rash.  Neurological: Negative for tremors, syncope and headaches.  Psychiatric/Behavioral: Negative for confusion and dysphoric mood.     Physical Exam Updated Vital Signs BP (!) 153/105    Pulse 85   Temp 97.8 F (36.6 C) (Oral)   Resp 18   Ht 6\' 3"  (1.905 m)   Wt 98.9 kg (218 lb)   SpO2 96%   BMI 27.25 kg/m   Physical Exam  Constitutional: He is oriented to person, place, and time. He appears well-developed and well-nourished.  HENT:  Head: Normocephalic and atraumatic.  Eyes: EOM are normal. Pupils are equal, round, and reactive to light.  Neck: Normal range of motion. Neck supple. No JVD present.  Cardiovascular: Normal rate and regular rhythm.  Exam reveals no gallop and no friction rub.   No murmur heard. Pulmonary/Chest: No respiratory distress. He has no wheezes.  Abdominal: He exhibits no distension. There is no  rebound and no guarding.  Musculoskeletal: Normal range of motion. He exhibits edema and tenderness.  Significant swelling to the left ankle. Tenderness throughout but worse on the lateral aspect. Pulses and motor intact distally. Patient has decreased sensation to the webspace between the first and second digit as well as the plantar aspect of the foot. Per the patient this is a chronic finding post shingles.  Neurological: He is alert and oriented to person, place, and time.  Skin: No rash noted. No pallor.  Psychiatric: He has a normal mood and affect. His behavior is normal.  Nursing note and vitals reviewed.    ED Treatments / Results  Labs (all labs ordered are listed, but only abnormal results are displayed) Labs Reviewed - No data to display  EKG  EKG Interpretation None       Radiology Dg Ankle Complete Left  Result Date: 02/26/2017 CLINICAL DATA:  Left ankle pain and swelling secondary to a fall from a golf cart this morning. EXAM: LEFT ANKLE COMPLETE - 3+ VIEW COMPARISON:  None. FINDINGS: There is a trimalleolar fracture of the left ankle. There is only a small avulsion of bone from the tip of the medial malleolus. There is a spiral fracture of the distal fibular shaft. There is a small avulsed posterior malleolar fragment seen  on the lateral view. Associated soft tissue swelling. IMPRESSION: Trimalleolar fracture of the left ankle. Electronically Signed   By: Lorriane Shire M.D.   On: 02/26/2017 12:34    Procedures Reduction of dislocation Date/Time: 02/26/2017 2:09 PM Performed by: Tyrone Nine Santhiago Collingsworth Authorized by: Deno Etienne  Consent: Verbal consent obtained. Risks and benefits: risks, benefits and alternatives were discussed Consent given by: patient Patient understanding: patient states understanding of the procedure being performed Patient consent: the patient's understanding of the procedure matches consent given Required items: required blood products, implants, devices, and special equipment available Patient identity confirmed: verbally with patient Time out: Immediately prior to procedure a "time out" was called to verify the correct patient, procedure, equipment, support staff and site/side marked as required. Preparation: Patient was prepped and draped in the usual sterile fashion. Local anesthesia used: yes Anesthesia: local infiltration  Anesthesia: Local anesthesia used: yes Local Anesthetic: lidocaine 1% with epinephrine Anesthetic total: 8 mL  Sedation: Patient sedated: no Patient tolerance: Patient tolerated the procedure well with no immediate complications  .Joint Aspiration/Arthrocentesis Date/Time: 02/26/2017 2:10 PM Performed by: Deno Etienne Authorized by: Deno Etienne   Consent:    Consent obtained:  Verbal   Consent given by:  Patient   Risks discussed:  Bleeding, infection and incomplete drainage   Alternatives discussed:  No treatment Location:    Location:  Ankle   Ankle:  L ankle Anesthesia (see MAR for exact dosages):    Anesthesia method:  Local infiltration   Local anesthetic:  Lidocaine 1% WITH epi Procedure details:    Preparation: Patient was prepped and draped in usual sterile fashion     Needle gauge:  20 G (21)   Ultrasound guidance: no     Approach:  Medial    Aspirate amount:  0 Post-procedure details:    Dressing:  Adhesive bandage   Patient tolerance of procedure:  Tolerated well, no immediate complications   (including critical care time)  Medications Ordered in ED Medications  fentaNYL (SUBLIMAZE) injection 100 mcg (100 mcg Intravenous Given 02/26/17 1313)  lidocaine-EPINEPHrine (XYLOCAINE W/EPI) 1 %-1:100000 (with pres) injection 20 mL (20 mLs Intradermal Given 02/26/17 1315)  Initial Impression / Assessment and Plan / ED Course  I have reviewed the triage vital signs and the nursing notes.  Pertinent labs & imaging results that were available during my care of the patient were reviewed by me and considered in my medical decision making (see chart for details).     81 yo M With a chief complaint of left ankle pain. Fracture noted on x-ray. He has some medial joint space widening. Will attempt to split with slight inversion. Orthopedic follow-up.  2:10 PM:  I have discussed the diagnosis/risks/treatment options with the patient and family and believe the pt to be eligible for discharge home to follow-up with Ortho. We also discussed returning to the ED immediately if new or worsening sx occur. We discussed the sx which are most concerning (e.g., sudden worsening pain, fever, inability to tolerate by mouth) that necessitate immediate return. Medications administered to the patient during their visit and any new prescriptions provided to the patient are listed below.  Medications given during this visit Medications  fentaNYL (SUBLIMAZE) injection 100 mcg (100 mcg Intravenous Given 02/26/17 1313)  lidocaine-EPINEPHrine (XYLOCAINE W/EPI) 1 %-1:100000 (with pres) injection 20 mL (20 mLs Intradermal Given 02/26/17 1315)     The patient appears reasonably screen and/or stabilized for discharge and I doubt any other medical condition or other [facility] requiring further screening, evaluation, or treatment in the ED at this time prior to discharge.     Final Clinical Impressions(s) / ED Diagnoses   Final diagnoses:  Closed trimalleolar fracture of left ankle, initial encounter    New Prescriptions New Prescriptions   MORPHINE (MSIR) 15 MG TABLET    Take 1 tablet (15 mg total) by mouth every 4 (four) hours as needed for severe pain.     Deno Etienne, DO 02/26/17 [place], [place], DO 03/10/17 [phone]

## 2017-02-26 NOTE — ED Triage Notes (Signed)
Pt reports missing a step and falling this am while golfing. Pt denies head injury, only complaint is pain to L ankle. Swelling noted. Pt takes plavix.

## 2017-02-26 NOTE — Discharge Instructions (Signed)
Take tylenol 1000mg(2 extra strength) four times a day.  ° °Then take the pain medicine if you feel like you need it. Narcotics do not help with the pain, they only make you care about it less.  You can become addicted to this, people may break into your house to steal it.  It will constipate you.  If you drive under the influence of this medicine you can get a DUI.   ° °

## 2017-03-01 ENCOUNTER — Encounter (INDEPENDENT_AMBULATORY_CARE_PROVIDER_SITE_OTHER): Payer: Self-pay | Admitting: Orthopaedic Surgery

## 2017-03-01 ENCOUNTER — Ambulatory Visit (INDEPENDENT_AMBULATORY_CARE_PROVIDER_SITE_OTHER): Payer: Federal, State, Local not specified - PPO | Admitting: Orthopaedic Surgery

## 2017-03-01 ENCOUNTER — Encounter (HOSPITAL_COMMUNITY): Payer: Self-pay | Admitting: *Deleted

## 2017-03-01 ENCOUNTER — Telehealth: Payer: Self-pay | Admitting: Nurse Practitioner

## 2017-03-01 VITALS — BP 116/70 | HR 101 | Resp 14 | Ht 75.0 in | Wt 216.0 lb

## 2017-03-01 DIAGNOSIS — S82842A Displaced bimalleolar fracture of left lower leg, initial encounter for closed fracture: Secondary | ICD-10-CM | POA: Diagnosis not present

## 2017-03-01 DIAGNOSIS — I2510 Atherosclerotic heart disease of native coronary artery without angina pectoris: Secondary | ICD-10-CM

## 2017-03-01 NOTE — Progress Notes (Signed)
Office Visit Note   Patient: Kristopher Wilson           Date of Birth: 09-21-1935           MRN: [id number] Visit Date: 03/01/2017              Requested by: Dorothyann Peng, NP [address] [place], [place] [address] PCP: Dorothyann Peng, NP   Assessment & Plan: Visit Diagnoses:  1. Ankle fracture, bimalleolar, closed, left, initial encounter   Displaced left distal fibula fracture, tear of deltoid ligament, nondisplaced fracture of posterior tibial plafond  Plan: Open reduction internal fixation of left distal fibula fracture, repair of deltoid ligament tear. Posterior tibial plafond fracture is small and essentially nondisplaced. Long discussion with Kristopher Wilson regarding his fracture attack, treatment of what she may expect over time including some further alteration sensibility, ankle stiffness infection, etc.  Follow-Up Instructions: Return POST OP.   Orders:  No orders of the defined types were placed in this encounter.  No orders of the defined types were placed in this encounter.     Procedures: No procedures performed   Clinical Data: No additional findings.   Subjective: Chief Complaint  Patient presents with  . Left Ankle - Fracture    Kristopher Wilson is a 81 y o that presents  with a Closed trimalleolar fracture of left ankle on Friday, 6/22. XR obtained at [facility]  Kristopher Wilson relates injuring his left ankle playing golf on Friday. (3 days ago) he stepped out of a golf cart onto a railroad tie used along the side of the cart path. He file a twisting injury to his ankle with immediate onset of pain and swelling. He was seen in the emergency room at [facility] with a diagnosis of a trimalleolar fracture. This was splinted and he was asked to follow-up today in the office. He's been relatively comfortable over the weekend. He is on Plavix with a history of cardiac stent. I did review the films. He has a displaced distal fibula fracture. Ligament appears to  be a torn with a lateral shift of the talus and widening of the medial ankle mortise. There is also a nondisplaced fracture of the posterior tibial plafond.  HPI  Review of Systems   Objective: Vital Signs: BP 116/70   Pulse (!) 101   Resp 14   Ht 6' 3"  (1.905 m)   Wt 216 lb (98 kg)   BMI 27.00 kg/m   Physical Exam  Ortho Exam posterior splint was removed so I can evaluate the skin. There is moderate edema. No blister formation. Kristopher Wilson has a history of shingles with some altered sensibility in his left foot. He also has a history of a drop foot and still has some residual weakness in dorsiflexion. He had what appeared to be normal feeling in the dorsum of his foot and altered sensibility in the plantar aspect of his foot which he relates is his normal "status". He was able to dorsiflex his toes and his foot but they appeared to be weak.  Specialty Comments:  No specialty comments available.  Imaging: No results found.   PMFS History: Patient Active Problem List   Diagnosis Date Noted  . Routine general medical examination at a health care facility 02/06/2016  . Lumbosacral radiculopathy due to herpes zoster 12/10/2015  . Neck pain on right side 07/22/2015  . Long term current use of antithrombotics/antiplatelets - clopidogrel and ASA 03/28/2015  . Left  shoulder pain 05/31/2014  . Restless leg syndrome 11/14/2013  . Night sweats 09/12/2013  . Hyperlipidemia 12/18/2010  . Pain in the chest 08/11/2010  . VENOUS INSUFFICIENCY 07/28/2010  . 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 ULCERATIVE COLITIS, LEFT SIDED 12/27/2009  . COLONIC POLYPS, ADENOMATOUS, HX OF 12/27/2009  . BACK PAIN, LUMBAR 10/18/2009  . GERD 04/18/2008  . BENIGN PAROXYSMAL POSITIONAL VERTIGO 09/30/2007  . Essential hypertension 07/07/2007  . CORONARY ARTERY DISEASE 07/07/2007  . PROSTATE CANCER, HX OF 07/07/2007   Past Medical History:  Diagnosis Date  . BACK PAIN, LUMBAR 10/18/2009  . Benign  paroxysmal positional vertigo 09/30/2007  . CAD (coronary artery disease)   . CHF (congestive heart failure) ([place])   . CKD (chronic kidney disease)   . COLONIC POLYPS, ADENOMATOUS, HX OF   . GERD (gastroesophageal reflux disease)   . Herpes zoster with other nervous system complications(053.19)   . Hyperlipidemia   . Hypertension   . Hyperthyroidism   . Loss of hearing    Bil/has hearing aids in both ears  . Nerve damage of left foot   . PERIPHERAL NEUROPATHY 10/11/2007  . 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 PROSTATE CANCER, HX OF 07/07/2007   s/p prostatectomy  . RLS (restless legs syndrome)   . Ulcer   . ULCERATIVE COLITIS, LEFT SIDED 2000  . URTICARIA, ALLERGIC 08/05/2009  . VENOUS INSUFFICIENCY 07/28/2010    Family History  Problem Relation Age of Onset  . Heart attack Father   . Throat cancer Brother   . Multiple sclerosis Brother   . Multiple sclerosis Son   . Diabetes Maternal Uncle        x 2  . Diabetes Maternal Uncle   . Colon cancer Neg Hx   . Stroke Neg Hx     Past Surgical History:  Procedure Laterality Date  . ANGIOPLASTY  1990's/2004   stent placement 2  . COLONOSCOPY W/ BIOPSIES AND POLYPECTOMY  06/06/2009   adenomatous polyp, diverticulosis, colitis  . INGUINAL HERNIA REPAIR    . PROSTATECTOMY    . RETINAL LASER PROCEDURE Right 2016 & 2017   . ROTATOR CUFF REPAIR Left   . TONSILLECTOMY     Social History   Occupational History  . retired Retired  . [employer]    Social History Main Topics  . Smoking status: Former Smoker    Types: Cigarettes    Quit date: 09/08/1975  . Smokeless tobacco: Never Used  . Alcohol use 9.0 oz/week    15 Standard drinks or equivalent per week     Comment: 1-2 "high balls" daily  . Drug use: No  . Sexual activity: Not on file     Kristopher Balding, MD   Note - This record has been created using [employer].  Chart creation errors have been sought, but may not always  have been located. Such  creation errors do not reflect on  the standard of medical care.

## 2017-03-01 NOTE — Progress Notes (Signed)
Pt has hx of CAD with stents many years ago. Pt denies any recent chest pain or sob. Saw cardiologist today for checkup. Clearance has been given by cardiologist. Last dose of Plavix was yesterday AM. Pt states he is not diabetic.

## 2017-03-01 NOTE — Telephone Encounter (Signed)
Ov note and telephone note routed to Dr. Joni Fears at [facility] ortho.

## 2017-03-01 NOTE — Telephone Encounter (Signed)
New message       [facility] Pre-operative Risk Assessment    Request for surgical clearance:  1. What type of surgery is being performed?  Open reduction internal left distal fibula fracture   2. When is this surgery scheduled?  03/02/17  3. Are there any medications that need to be held prior to surgery and how long? Not sure if he take any medication   4. Name of physician performing surgery?  Dr Durward Fortes  5. What is your office phone and fax number?  [phone] fax  [phone]   Kristopher Wilson 03/01/2017, 9:37 AM  _________________________________________________________________   (provider comments below)

## 2017-03-01 NOTE — Telephone Encounter (Signed)
Please send my last note and this phone note to Dr. Durward Fortes. Patient was having chest pain at his visit - Imdur was increased. He will need to have his fracture fixed. Discussed with Dr. Angelena Form here in the office today - patient is at increased risk but ok to proceed with planned surgical procedure. Would like to continue with his current medicines but can hold Plavix if needed. Needs to continue with aspirin. We will be available as needed.

## 2017-03-02 ENCOUNTER — Encounter (HOSPITAL_COMMUNITY): Payer: Self-pay | Admitting: *Deleted

## 2017-03-02 ENCOUNTER — Ambulatory Visit (HOSPITAL_COMMUNITY)
Admission: RE | Admit: 2017-03-02 | Discharge: 2017-03-02 | Disposition: A | Discharge status: Home / Self Care | Payer: Federal, State, Local not specified - PPO | Source: Ambulatory Visit | Attending: Orthopaedic Surgery | Admitting: Orthopaedic Surgery

## 2017-03-02 ENCOUNTER — Ambulatory Visit (HOSPITAL_COMMUNITY): Payer: Federal, State, Local not specified - PPO

## 2017-03-02 ENCOUNTER — Encounter (HOSPITAL_COMMUNITY)
Admission: RE | Disposition: A | Discharge status: Home / Self Care | Payer: Self-pay | Source: Ambulatory Visit | Attending: Orthopaedic Surgery

## 2017-03-02 ENCOUNTER — Ambulatory Visit (HOSPITAL_COMMUNITY): Payer: Federal, State, Local not specified - PPO | Admitting: Anesthesiology

## 2017-03-02 DIAGNOSIS — W1789XA Other fall from one level to another, initial encounter: Secondary | ICD-10-CM | POA: Diagnosis not present

## 2017-03-02 DIAGNOSIS — E0590 Thyrotoxicosis, unspecified without thyrotoxic crisis or storm: Secondary | ICD-10-CM | POA: Diagnosis not present

## 2017-03-02 DIAGNOSIS — Z8249 Family history of ischemic heart disease and other diseases of the circulatory system: Secondary | ICD-10-CM | POA: Insufficient documentation

## 2017-03-02 DIAGNOSIS — Z820 Family history of epilepsy and other diseases of the nervous system: Secondary | ICD-10-CM | POA: Insufficient documentation

## 2017-03-02 DIAGNOSIS — G629 Polyneuropathy, unspecified: Secondary | ICD-10-CM | POA: Diagnosis not present

## 2017-03-02 DIAGNOSIS — G2581 Restless legs syndrome: Secondary | ICD-10-CM | POA: Insufficient documentation

## 2017-03-02 DIAGNOSIS — B0229 Other postherpetic nervous system involvement: Secondary | ICD-10-CM | POA: Insufficient documentation

## 2017-03-02 DIAGNOSIS — S82832D Other fracture of upper and lower end of left fibula, subsequent encounter for closed fracture with routine healing: Secondary | ICD-10-CM | POA: Diagnosis not present

## 2017-03-02 DIAGNOSIS — I2510 Atherosclerotic heart disease of native coronary artery without angina pectoris: Secondary | ICD-10-CM | POA: Diagnosis not present

## 2017-03-02 DIAGNOSIS — Z79899 Other long term (current) drug therapy: Secondary | ICD-10-CM | POA: Insufficient documentation

## 2017-03-02 DIAGNOSIS — M5417 Radiculopathy, lumbosacral region: Secondary | ICD-10-CM | POA: Diagnosis not present

## 2017-03-02 DIAGNOSIS — Z87891 Personal history of nicotine dependence: Secondary | ICD-10-CM | POA: Diagnosis not present

## 2017-03-02 DIAGNOSIS — Z8546 Personal history of malignant neoplasm of prostate: Secondary | ICD-10-CM | POA: Diagnosis not present

## 2017-03-02 DIAGNOSIS — I130 Hypertensive heart and chronic kidney disease with heart failure and stage 1 through stage 4 chronic kidney disease, or unspecified chronic kidney disease: Secondary | ICD-10-CM | POA: Diagnosis not present

## 2017-03-02 DIAGNOSIS — H8110 Benign paroxysmal vertigo, unspecified ear: Secondary | ICD-10-CM | POA: Insufficient documentation

## 2017-03-02 DIAGNOSIS — Y9389 Activity, other specified: Secondary | ICD-10-CM | POA: Insufficient documentation

## 2017-03-02 DIAGNOSIS — Z86010 Personal history of colonic polyps: Secondary | ICD-10-CM | POA: Insufficient documentation

## 2017-03-02 DIAGNOSIS — Z01818 Encounter for other preprocedural examination: Secondary | ICD-10-CM

## 2017-03-02 DIAGNOSIS — N189 Chronic kidney disease, unspecified: Secondary | ICD-10-CM | POA: Diagnosis not present

## 2017-03-02 DIAGNOSIS — I739 Peripheral vascular disease, unspecified: Secondary | ICD-10-CM | POA: Insufficient documentation

## 2017-03-02 DIAGNOSIS — Z800 Family history of malignant neoplasm of digestive organs: Secondary | ICD-10-CM | POA: Insufficient documentation

## 2017-03-02 DIAGNOSIS — I872 Venous insufficiency (chronic) (peripheral): Secondary | ICD-10-CM | POA: Diagnosis not present

## 2017-03-02 DIAGNOSIS — Z7902 Long term (current) use of antithrombotics/antiplatelets: Secondary | ICD-10-CM | POA: Insufficient documentation

## 2017-03-02 DIAGNOSIS — Y9353 Activity, golf: Secondary | ICD-10-CM | POA: Insufficient documentation

## 2017-03-02 DIAGNOSIS — E785 Hyperlipidemia, unspecified: Secondary | ICD-10-CM | POA: Diagnosis not present

## 2017-03-02 DIAGNOSIS — S82842A Displaced bimalleolar fracture of left lower leg, initial encounter for closed fracture: Secondary | ICD-10-CM | POA: Diagnosis present

## 2017-03-02 DIAGNOSIS — S93429A Sprain of deltoid ligament of unspecified ankle, initial encounter: Secondary | ICD-10-CM | POA: Diagnosis present

## 2017-03-02 DIAGNOSIS — S93422A Sprain of deltoid ligament of left ankle, initial encounter: Secondary | ICD-10-CM | POA: Insufficient documentation

## 2017-03-02 DIAGNOSIS — Z833 Family history of diabetes mellitus: Secondary | ICD-10-CM | POA: Insufficient documentation

## 2017-03-02 DIAGNOSIS — Z7982 Long term (current) use of aspirin: Secondary | ICD-10-CM | POA: Diagnosis not present

## 2017-03-02 DIAGNOSIS — S82852A Displaced trimalleolar fracture of left lower leg, initial encounter for closed fracture: Secondary | ICD-10-CM | POA: Insufficient documentation

## 2017-03-02 DIAGNOSIS — I509 Heart failure, unspecified: Secondary | ICD-10-CM | POA: Diagnosis not present

## 2017-03-02 DIAGNOSIS — Z955 Presence of coronary angioplasty implant and graft: Secondary | ICD-10-CM | POA: Diagnosis not present

## 2017-03-02 DIAGNOSIS — S82832A Other fracture of upper and lower end of left fibula, initial encounter for closed fracture: Secondary | ICD-10-CM | POA: Diagnosis present

## 2017-03-02 DIAGNOSIS — S93422D Sprain of deltoid ligament of left ankle, subsequent encounter: Secondary | ICD-10-CM

## 2017-03-02 DIAGNOSIS — K219 Gastro-esophageal reflux disease without esophagitis: Secondary | ICD-10-CM | POA: Insufficient documentation

## 2017-03-02 HISTORY — DX: Malignant (primary) neoplasm, unspecified: C80.1

## 2017-03-02 HISTORY — PX: ORIF FIBULA FRACTURE: SHX5114

## 2017-03-02 LAB — BASIC METABOLIC PANEL
Anion gap: 9 (ref 5–15)
BUN: 14 mg/dL (ref 6–20)
CALCIUM: 9 mg/dL (ref 8.9–10.3)
CO2: 24 mmol/L (ref 22–32)
CREATININE: 1.29 mg/dL — AB (ref 0.61–1.24)
Chloride: 104 mmol/L (ref 101–111)
GFR, EST AFRICAN AMERICAN: 60 mL/min — AB (ref >60)
GFR, EST NON AFRICAN AMERICAN: 51 mL/min — AB (ref >60)
Glucose, Bld: 115 mg/dL — ABNORMAL HIGH (ref 65–99)
Potassium: 4.4 mmol/L (ref 3.5–5.1)
SODIUM: 137 mmol/L (ref 135–145)

## 2017-03-02 LAB — CBC
HCT: 35.4 % — ABNORMAL LOW (ref 39.0–52.0)
HEMOGLOBIN: 11.2 g/dL — AB (ref 13.0–17.0)
MCH: 28.2 pg (ref 26.0–34.0)
MCHC: 31.6 g/dL (ref 30.0–36.0)
MCV: 89.2 fL (ref 78.0–100.0)
PLATELETS: 273 10*3/uL (ref 150–400)
RBC: 3.97 MIL/uL — ABNORMAL LOW (ref 4.22–5.81)
RDW: 14 % (ref 11.5–15.5)
WBC: 7.8 10*3/uL (ref 4.0–10.5)

## 2017-03-02 SURGERY — OPEN REDUCTION INTERNAL FIXATION (ORIF) FIBULA FRACTURE
Anesthesia: General | Site: Leg Lower | Laterality: Left

## 2017-03-02 MED ORDER — DEXAMETHASONE SODIUM PHOSPHATE 10 MG/ML IJ SOLN
INTRAMUSCULAR | Status: DC | PRN
  Administered 2017-03-02: 10 mg via INTRAVENOUS

## 2017-03-02 MED ORDER — OXYCODONE-ACETAMINOPHEN 5-325 MG PO TABS: 1.0000 | ORAL_TABLET | ORAL | 0 refills | Status: DC | PRN

## 2017-03-02 MED ORDER — FENTANYL CITRATE (PF) 100 MCG/2ML IJ SOLN
INTRAMUSCULAR | Status: AC
  Administered 2017-03-02: 100 ug via INTRAVENOUS
  Filled 2017-03-02: qty 2

## 2017-03-02 MED ORDER — LIDOCAINE 2% (20 MG/ML) 5 ML SYRINGE
INTRAMUSCULAR | Status: AC
  Filled 2017-03-02: qty 10

## 2017-03-02 MED ORDER — PHENYLEPHRINE HCL 10 MG/ML IJ SOLN
INTRAVENOUS | Status: DC | PRN
  Administered 2017-03-02: 25 ug/min via INTRAVENOUS

## 2017-03-02 MED ORDER — FENTANYL CITRATE (PF) 250 MCG/5ML IJ SOLN
INTRAMUSCULAR | Status: AC
  Filled 2017-03-02: qty 5

## 2017-03-02 MED ORDER — PHENYLEPHRINE HCL 10 MG/ML IJ SOLN
INTRAMUSCULAR | Status: DC | PRN
  Administered 2017-03-02 (×3): 80 ug via INTRAVENOUS

## 2017-03-02 MED ORDER — FENTANYL CITRATE (PF) 100 MCG/2ML IJ SOLN: 25.0000 ug | INTRAMUSCULAR | Status: DC | PRN

## 2017-03-02 MED ORDER — ACETAMINOPHEN 10 MG/ML IV SOLN
INTRAVENOUS | Status: DC | PRN
  Administered 2017-03-02: 1000 mg via INTRAVENOUS

## 2017-03-02 MED ORDER — MIDAZOLAM HCL 2 MG/2ML IJ SOLN
INTRAMUSCULAR | Status: AC
  Administered 2017-03-02: 2 mg via INTRAVENOUS
  Filled 2017-03-02: qty 2

## 2017-03-02 MED ORDER — ROCURONIUM BROMIDE 10 MG/ML (PF) SYRINGE
PREFILLED_SYRINGE | INTRAVENOUS | Status: AC
  Filled 2017-03-02: qty 5

## 2017-03-02 MED ORDER — MIDAZOLAM HCL 2 MG/2ML IJ SOLN
1.0000 mg | INTRAMUSCULAR | Status: DC | PRN
  Administered 2017-03-02: 2 mg via INTRAVENOUS

## 2017-03-02 MED ORDER — FENTANYL CITRATE (PF) 100 MCG/2ML IJ SOLN
INTRAMUSCULAR | Status: DC | PRN
  Administered 2017-03-02: 25 ug via INTRAVENOUS

## 2017-03-02 MED ORDER — LIDOCAINE HCL (CARDIAC) 20 MG/ML IV SOLN
INTRAVENOUS | Status: DC | PRN
  Administered 2017-03-02: 40 mg via INTRAVENOUS

## 2017-03-02 MED ORDER — BUPIVACAINE-EPINEPHRINE (PF) 0.5% -1:200000 IJ SOLN
INTRAMUSCULAR | Status: AC
  Filled 2017-03-02: qty 30

## 2017-03-02 MED ORDER — FENTANYL CITRATE (PF) 100 MCG/2ML IJ SOLN
50.0000 ug | INTRAMUSCULAR | Status: DC | PRN
  Administered 2017-03-02: 100 ug via INTRAVENOUS

## 2017-03-02 MED ORDER — BUPIVACAINE-EPINEPHRINE (PF) 0.5% -1:200000 IJ SOLN
INTRAMUSCULAR | Status: DC | PRN
  Administered 2017-03-02: 30 mL via PERINEURAL

## 2017-03-02 MED ORDER — PROPOFOL 10 MG/ML IV BOLUS
INTRAVENOUS | Status: DC | PRN
  Administered 2017-03-02: 120 mg via INTRAVENOUS

## 2017-03-02 MED ORDER — BUPIVACAINE HCL (PF) 0.5 % IJ SOLN
INTRAMUSCULAR | Status: DC | PRN
  Administered 2017-03-02: 10 mL

## 2017-03-02 MED ORDER — PHENYLEPHRINE 40 MCG/ML (10ML) SYRINGE FOR IV PUSH (FOR BLOOD PRESSURE SUPPORT)
PREFILLED_SYRINGE | INTRAVENOUS | Status: AC
  Filled 2017-03-02: qty 20

## 2017-03-02 MED ORDER — ACETAMINOPHEN 10 MG/ML IV SOLN
INTRAVENOUS | Status: AC
  Filled 2017-03-02: qty 100

## 2017-03-02 MED ORDER — DEXAMETHASONE SODIUM PHOSPHATE 10 MG/ML IJ SOLN
INTRAMUSCULAR | Status: AC
  Filled 2017-03-02: qty 1

## 2017-03-02 MED ORDER — EPHEDRINE 5 MG/ML INJ
INTRAVENOUS | Status: AC
  Filled 2017-03-02: qty 10

## 2017-03-02 MED ORDER — SODIUM CHLORIDE 0.9 % IV SOLN: INTRAVENOUS | Status: DC

## 2017-03-02 MED ORDER — ONDANSETRON HCL 4 MG/2ML IJ SOLN
INTRAMUSCULAR | Status: DC | PRN
Start: 2017-03-02 — End: 2017-03-02
  Administered 2017-03-02: 4 mg via INTRAVENOUS

## 2017-03-02 MED ORDER — PROPOFOL 10 MG/ML IV BOLUS
INTRAVENOUS | Status: AC
  Filled 2017-03-02: qty 20

## 2017-03-02 MED ORDER — 0.9 % SODIUM CHLORIDE (POUR BTL) OPTIME
TOPICAL | Status: DC | PRN
  Administered 2017-03-02: 1000 mL

## 2017-03-02 MED ORDER — CLINDAMYCIN PHOSPHATE 900 MG/50ML IV SOLN
900.0000 mg | INTRAVENOUS | Status: AC
  Administered 2017-03-02: 600 mg via INTRAVENOUS
  Filled 2017-03-02: qty 50

## 2017-03-02 MED ORDER — LACTATED RINGERS IV SOLN
INTRAVENOUS | Status: DC | PRN
  Administered 2017-03-02 (×2): via INTRAVENOUS

## 2017-03-02 SURGICAL SUPPLY — 69 items
ANCHOR SUT 1.45 SZ 1 SHORT (Anchor) ×2 IMPLANT
BANDAGE ACE 4X5 VEL STRL LF (GAUZE/BANDAGES/DRESSINGS) IMPLANT
BANDAGE ACE 6X5 VEL STRL LF (GAUZE/BANDAGES/DRESSINGS) IMPLANT
BANDAGE ELASTIC 4 VELCRO ST LF (GAUZE/BANDAGES/DRESSINGS) ×2 IMPLANT
BANDAGE ESMARK 6X9 LF (GAUZE/BANDAGES/DRESSINGS) ×1 IMPLANT
BIT DRILL 2.5X2.75 QC CALB (BIT) ×4 IMPLANT
BIT DRILL 3.5X5.5 QC CALB (BIT) ×4 IMPLANT
BIT DRILL CALIBRATED 2.7 (BIT) ×2 IMPLANT
BLADE SURG 10 STRL SS (BLADE) ×2 IMPLANT
BNDG ESMARK 6X9 LF (GAUZE/BANDAGES/DRESSINGS) ×2
BNDG GAUZE ELAST 4 BULKY (GAUZE/BANDAGES/DRESSINGS) ×10 IMPLANT
COVER SURGICAL LIGHT HANDLE (MISCELLANEOUS) ×2 IMPLANT
CUFF TOURNIQUET SINGLE 34IN LL (TOURNIQUET CUFF) IMPLANT
CUFF TOURNIQUET SINGLE 44IN (TOURNIQUET CUFF) IMPLANT
DRAPE OEC MINIVIEW 54X84 (DRAPES) ×2 IMPLANT
DRSG ADAPTIC 3X8 NADH LF (GAUZE/BANDAGES/DRESSINGS) ×2 IMPLANT
DRSG PAD ABDOMINAL 8X10 ST (GAUZE/BANDAGES/DRESSINGS) IMPLANT
DURAPREP 26ML APPLICATOR (WOUND CARE) ×2 IMPLANT
ELECT REM PT RETURN 9FT ADLT (ELECTROSURGICAL) ×2
ELECTRODE REM PT RTRN 9FT ADLT (ELECTROSURGICAL) ×1 IMPLANT
FACESHIELD WRAPAROUND (MASK) ×2 IMPLANT
GAUZE SPONGE 4X4 12PLY STRL (GAUZE/BANDAGES/DRESSINGS) IMPLANT
GAUZE SPONGE 4X4 12PLY STRL LF (GAUZE/BANDAGES/DRESSINGS) ×2 IMPLANT
GLOVE BIOGEL PI IND STRL 8 (GLOVE) ×1 IMPLANT
GLOVE BIOGEL PI INDICATOR 8 (GLOVE) ×1
GLOVE ECLIPSE 8.0 STRL XLNG CF (GLOVE) ×2 IMPLANT
GOWN STRL REUS W/ TWL LRG LVL3 (GOWN DISPOSABLE) ×2 IMPLANT
GOWN STRL REUS W/ TWL XL LVL3 (GOWN DISPOSABLE) ×1 IMPLANT
GOWN STRL REUS W/TWL LRG LVL3 (GOWN DISPOSABLE) ×2
GOWN STRL REUS W/TWL XL LVL3 (GOWN DISPOSABLE) ×1
KIT BASIN OR (CUSTOM PROCEDURE TRAY) ×2 IMPLANT
KIT ROOM TURNOVER OR (KITS) ×2 IMPLANT
MANIFOLD NEPTUNE II (INSTRUMENTS) ×2 IMPLANT
NEEDLE 22X1 1/2 (OR ONLY) (NEEDLE) IMPLANT
NEEDLE KEITH (NEEDLE) IMPLANT
NS IRRIG 1000ML POUR BTL (IV SOLUTION) ×2 IMPLANT
PACK ORTHO EXTREMITY (CUSTOM PROCEDURE TRAY) ×2 IMPLANT
PAD ARMBOARD 7.5X6 YLW CONV (MISCELLANEOUS) ×4 IMPLANT
PAD CAST 4YDX4 CTTN HI CHSV (CAST SUPPLIES) ×1 IMPLANT
PADDING CAST COTTON 4X4 STRL (CAST SUPPLIES) ×1
PADDING CAST COTTON 6X4 STRL (CAST SUPPLIES) ×2 IMPLANT
PLATE LOCK 8H 103 BILAT FIB (Plate) ×2 IMPLANT
SCREW CORTICAL 3.5MM 22MM (Screw) ×2 IMPLANT
SCREW LOCK CORT STAR 3.5X10 (Screw) ×4 IMPLANT
SCREW LOCK CORT STAR 3.5X12 (Screw) ×4 IMPLANT
SCREW NON LOCKING LP 3.5 14MM (Screw) ×4 IMPLANT
SCREW NON LOCKING LP 3.5 16MM (Screw) ×2 IMPLANT
SPLINT PLASTER CAST XFAST 5X30 (CAST SUPPLIES) ×1 IMPLANT
SPLINT PLASTER XFAST SET 5X30 (CAST SUPPLIES) ×1
SPONGE LAP 4X18 X RAY DECT (DISPOSABLE) ×4 IMPLANT
STAPLER VISISTAT 35W (STAPLE) IMPLANT
STRIP CLOSURE SKIN 1/2X4 (GAUZE/BANDAGES/DRESSINGS) IMPLANT
SUCTION FRAZIER HANDLE 10FR (MISCELLANEOUS) ×1
SUCTION TUBE FRAZIER 10FR DISP (MISCELLANEOUS) ×1 IMPLANT
SUT ETHIBOND CT1 BRD 2-0 30IN (SUTURE) IMPLANT
SUT ETHIBOND NAB BRD #0 18IN (SUTURE) IMPLANT
SUT MNCRL AB 3-0 PS2 18 (SUTURE) ×4 IMPLANT
SUT PROLENE 3 0 PS 2 (SUTURE) IMPLANT
SUT VIC AB 2-0 CT1 27 (SUTURE)
SUT VIC AB 2-0 CT1 TAPERPNT 27 (SUTURE) IMPLANT
SUT VIC AB 2-0 CTB1 (SUTURE) IMPLANT
SUT VIC AB 3-0 FS2 27 (SUTURE) ×4 IMPLANT
SUT VIC AB 3-0 PS2 18 (SUTURE)
SUT VIC AB 3-0 PS2 18XBRD (SUTURE) IMPLANT
SYR CONTROL 10ML LL (SYRINGE) IMPLANT
TOWEL OR 17X24 6PK STRL BLUE (TOWEL DISPOSABLE) ×2 IMPLANT
TOWEL OR 17X26 10 PK STRL BLUE (TOWEL DISPOSABLE) ×2 IMPLANT
TUBE CONNECTING 12X1/4 (SUCTIONS) ×2 IMPLANT
WATER STERILE IRR 1000ML POUR (IV SOLUTION) ×2 IMPLANT

## 2017-03-02 NOTE — Anesthesia Procedure Notes (Signed)
Anesthesia Regional Block: Popliteal block   Pre-Anesthetic Checklist: ,, timeout performed, Correct Patient, Correct Site, Correct Laterality, Correct Procedure, Correct Position, site marked, Risks and benefits discussed, pre-op evaluation,  At surgeon's request and post-op pain management  Laterality: Left  Prep: Maximum Sterile Barrier Precautions used, chloraprep       Needles:  Injection technique: Single-shot  Needle Type: Echogenic Stimulator Needle     Needle Length: 9cm  Needle Gauge: 21     Additional Needles:   Procedures: ultrasound guided, nerve stimulator,,,,,,   Nerve Stimulator or Paresthesia:  Response: Peroneal,  Response: Tibial,   Additional Responses:   Narrative:  Start time: 03/02/2017 12:47 PM End time: 03/02/2017 1:00 PM Injection made incrementally with aspirations every 5 mL. Anesthesiologist: Roderic Palau  Additional Notes: 2% Lidocaine skin wheel. Saphenous block with 10cc of 0.5% Bupivicaine plain.

## 2017-03-02 NOTE — Anesthesia Preprocedure Evaluation (Addendum)
Anesthesia Evaluation  Patient identified by MRN, date of birth, ID band Patient awake    Reviewed: Allergy & Precautions, H&P , NPO status , Patient's Chart, lab work & pertinent test results  Airway Mallampati: II  TM Distance: >3 FB Neck ROM: Full    Dental no notable dental hx. (+) Teeth Intact, Dental Advisory Given   Pulmonary neg pulmonary ROS, former smoker,    Pulmonary exam normal breath sounds clear to auscultation- rhonchi       Cardiovascular hypertension, Pt. on medications and Pt. on home beta blockers + CAD and + Peripheral Vascular Disease   Rhythm:Regular Rate:Normal     Neuro/Psych negative neurological ROS  negative psych ROS   GI/Hepatic Neg liver ROS, PUD, GERD  Medicated and Controlled,  Endo/Other  negative endocrine ROSHyperthyroidism   Renal/GU Renal InsufficiencyRenal disease  negative genitourinary   Musculoskeletal   Abdominal   Peds  Hematology negative hematology ROS (+)   Anesthesia Other Findings   Reproductive/Obstetrics negative OB ROS                            Anesthesia Physical Anesthesia Plan  ASA: III  Anesthesia Plan: General   Post-op Pain Management:  Regional for Post-op pain   Induction: Intravenous  PONV Risk Score and Plan: 2 and Ondansetron, Dexamethasone and Propofol  Airway Management Planned: LMA  Additional Equipment:   Intra-op Plan:   Post-operative Plan: Extubation in OR  Informed Consent: I have reviewed the patients History and Physical, chart, labs and discussed the procedure including the risks, benefits and alternatives for the proposed anesthesia with the patient or authorized representative who has indicated his/her understanding and acceptance.   Dental advisory given and Dental Advisory Given  Plan Discussed with: CRNA  Anesthesia Plan Comments:       Anesthesia Quick Evaluation

## 2017-03-02 NOTE — H&P (Signed)
Patient: Kristopher Wilson                                       Date of Birth: 23-Aug-1936                                                   MRN: [id number] Visit Date: 03/01/2017                                                                     Requested by: Dorothyann Peng, NP [address] [place], [place] [id number] PCP: Dorothyann Peng, NP   Assessment & Plan: Visit Diagnoses:  1. Ankle fracture, bimalleolar, closed, left, initial encounter   Displaced left distal fibula fracture, tear of deltoid ligament, nondisplaced fracture of posterior tibial plafond  Plan: Open reduction internal fixation of left distal fibula fracture, repair of deltoid ligament tear. Posterior tibial plafond fracture is small and essentially nondisplaced. Long discussion with Mr. and Mrs. [place] regarding his fracture attack, treatment of what he may expect over time including some further alteration sensibility, ankle stiffness infection, etc.  Follow-Up Instructions: Return POST OP.   Orders:  No orders of the defined types were placed in this encounter.  No orders of the defined types were placed in this encounter.     Procedures: No procedures performed   Clinical Data: No additional findings.   Subjective:     Chief Complaint  Patient presents with  . Left Ankle - Fracture    Kristopher Wilson is a 81 y o that presents  with a Closed trimalleolar fracture of left ankle on Friday, 6/22. XR obtained at [facility]   HPI Kristopher Wilson relates injuring his left ankle playing golf on Friday. (3 days ago) he stepped out of a golf cart onto a railroad tie used along the side of the cart path. He sustained a twisting injury to his ankle with immediate onset of pain and swelling. He was seen in the emergency room at [facility] with a diagnosis of a trimalleolar fracture. This was splinted and he was asked to follow-up today in the office. He's been relatively comfortable over the  weekend. He is on Plavix with a history of cardiac stent. Review of the films reveals he has a displaced distal fibula fracture. Ligament appears to be a torn with a lateral shift of the talus and widening of the medial ankle mortise. There is also a nondisplaced fracture of the posterior tibial plafond.    Objective: Vital Signs: BP 116/70   Pulse (!) 101   Resp 14   Ht 6\' 3"  (1.905 m)   Wt 216 lb (98 kg)   BMI 27.00 kg/m   Physical Exam Physical Exam  Constitutional: He appears well-developed and well-nourished.  HENT:  Head: Normocephalic.  Neck: Neck supple.  Cardiovascular: Normal rate and regular rhythm.   Pulmonary/Chest: Effort normal and breath sounds normal.  Abdominal: Soft.  Skin: Skin is warm and dry.    Ortho Exam posterior splint was removed so I can evaluate the skin. There is moderate edema. No blister formation. Kristopher Wilson has a history of shingles with some altered sensibility in his left foot. He also has a history of a drop foot and still has some residual weakness in dorsiflexion. He had what appeared to be normal feeling in the dorsum of his foot and altered sensibility in the plantar aspect of his foot which he relates is his normal "status". He was able to dorsiflex his toes and his foot but they appeared to be weak.  Specialty Comments:  No specialty comments available.  Imaging: No results found.  No current facility-administered medications for this encounter.    Current Outpatient Prescriptions  Medication Sig Dispense Refill  . clopidogrel (PLAVIX) 75 MG tablet Take 1 tablet (75 mg total) by mouth daily. 90 tablet 3  . aspirin 81 MG tablet Take 81 mg by mouth daily.      Marland Kitchen atorvastatin (LIPITOR) 40 MG tablet Take 40 mg by mouth daily.    . carvedilol (COREG) 12.5 MG tablet Take 12.5 mg by mouth 2 (two) times daily with a meal.    . isosorbide mononitrate (IMDUR) 30 MG 24 hr tablet Take 30 mg by mouth daily.  0  . meclizine (ANTIVERT) 12.5  MG tablet TAKE 1 TABLET (12.5 MG TOTAL) BY MOUTH 2 (TWO) TIMES DAILY AS NEEDED FOR DIZZINESS. 30 tablet 0  . Mesalamine 800 MG TBEC TAKE 2 TABLETS (=1,600MG ) 3TIMES A DAY (Patient not taking: Reported on 03/01/2017) 540 tablet 1  . morphine (MSIR) 15 MG tablet Take 1 tablet (15 mg total) by mouth every 4 (four) hours as needed for severe pain. 10 tablet 0  . nitroGLYCERIN (NITROSTAT) 0.4 MG SL tablet Place 0.4 mg under the tongue every 5 (five) minutes as needed for chest pain.    . pantoprazole (PROTONIX) 40 MG tablet Take 1 tablet (40 mg total) by mouth daily. 30 tablet 0  . pramipexole (MIRAPEX) 1 MG tablet Take 1 tablet (1 mg total) by mouth at bedtime. 90 tablet 3     PMFS History:     Patient Active Problem List   Diagnosis Date Noted  . Routine general medical examination at a health care facility 02/06/2016  . Lumbosacral radiculopathy due to herpes zoster 12/10/2015  . Neck pain on right side 07/22/2015  . Long term current use of antithrombotics/antiplatelets - clopidogrel and ASA 03/28/2015  . Left shoulder pain 05/31/2014  . Restless leg syndrome 11/14/2013  . Night sweats 09/12/2013  . Hyperlipidemia 12/18/2010  . Pain in the chest 08/11/2010  . VENOUS INSUFFICIENCY 07/28/2010  . 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 ULCERATIVE COLITIS, LEFT SIDED 12/27/2009  . COLONIC POLYPS, ADENOMATOUS, HX OF 12/27/2009  . BACK PAIN, LUMBAR 10/18/2009  . GERD 04/18/2008  . BENIGN PAROXYSMAL POSITIONAL VERTIGO 09/30/2007  . Essential hypertension 07/07/2007  . CORONARY ARTERY DISEASE 07/07/2007  . PROSTATE CANCER, HX OF 07/07/2007       Past Medical History:  Diagnosis Date  . BACK PAIN, LUMBAR 10/18/2009  . Benign paroxysmal positional vertigo 09/30/2007  . CAD (coronary artery disease)   . CHF (congestive heart failure) ([place])   . CKD (chronic kidney disease)   . COLONIC POLYPS, ADENOMATOUS, HX OF   . GERD (gastroesophageal reflux disease)   . Herpes zoster with  other nervous system complications(053.19)   . Hyperlipidemia   . Hypertension   .  Hyperthyroidism   . Loss of hearing    Bil/has hearing aids in both ears  . Nerve damage of left foot   . PERIPHERAL NEUROPATHY 10/11/2007  . 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 PROSTATE CANCER, HX OF 07/07/2007   s/p prostatectomy  . RLS (restless legs syndrome)   . Ulcer   . ULCERATIVE COLITIS, LEFT SIDED 2000  . URTICARIA, ALLERGIC 08/05/2009  . VENOUS INSUFFICIENCY 07/28/2010         Family History  Problem Relation Age of Onset  . Heart attack Father   . Throat cancer Brother   . Multiple sclerosis Brother   . Multiple sclerosis Son   . Diabetes Maternal Uncle        x 2  . Diabetes Maternal Uncle   . Colon cancer Neg Hx   . Stroke Neg Hx          Past Surgical History:  Procedure Laterality Date  . ANGIOPLASTY  1990's/2004   stent placement 2  . COLONOSCOPY W/ BIOPSIES AND POLYPECTOMY  06/06/2009   adenomatous polyp, diverticulosis, colitis  . INGUINAL HERNIA REPAIR    . PROSTATECTOMY    . RETINAL LASER PROCEDURE Right 2016 & 2017   . ROTATOR CUFF REPAIR Left   . TONSILLECTOMY     Social History       Occupational History  . retired Retired  . [employer]          Social History Main Topics  . Smoking status: Former Smoker    Types: Cigarettes    Quit date: 09/08/1975  . Smokeless tobacco: Never Used  . Alcohol use 9.0 oz/week    15 Standard drinks or equivalent per week     Comment: 1-2 "high balls" daily  . Drug use: No  . Sexual activity: Not on file

## 2017-03-02 NOTE — Transfer of Care (Signed)
Immediate Anesthesia Transfer of Care Note  Patient: Kristopher Wilson  Procedure(s) Performed: Procedure(s): OPEN REDUCTION INTERNAL FIXATION (ORIF) LEFT DISTAL FIBULA FRACTURE WITH DELTOID LIGAMENT REPAIR (Left)  Patient Location: PACU  Anesthesia Type:GA combined with regional for post-op pain  Level of Consciousness: awake, alert  and oriented  Airway & Oxygen Therapy: Patient Spontanous Breathing and Patient connected to nasal cannula oxygen  Post-op Assessment: Report given to RN and Post -op Vital signs reviewed and stable  Post vital signs: Reviewed and stable  Last Vitals:  Vitals:   03/02/17 1300 03/02/17 1529  BP: 124/63   Pulse: 86   Resp: 17   Temp:  36.1 C    Last Pain:  Vitals:   03/02/17 1032  TempSrc: Oral         Complications: No apparent anesthesia complications

## 2017-03-02 NOTE — Anesthesia Postprocedure Evaluation (Signed)
Anesthesia Post Note  Patient: DREYDEN ROHRMAN  Procedure(s) Performed: Procedure(s) (LRB): OPEN REDUCTION INTERNAL FIXATION (ORIF) LEFT DISTAL FIBULA FRACTURE WITH DELTOID LIGAMENT REPAIR (Left)     Patient location during evaluation: PACU Anesthesia Type: General and Regional Level of consciousness: awake and alert Pain management: pain level controlled Vital Signs Assessment: post-procedure vital signs reviewed and stable Respiratory status: spontaneous breathing, nonlabored ventilation and respiratory function stable Cardiovascular status: blood pressure returned to baseline and stable Postop Assessment: no signs of nausea or vomiting Anesthetic complications: no    Last Vitals:  Vitals:   03/02/17 1543 03/02/17 1551  BP:  116/71  Pulse: 73 73  Resp: 15 16  Temp:  36.1 C    Last Pain:  Vitals:   03/02/17 1551  TempSrc:   PainSc: 0-No pain                 Saqib Cazarez,W. EDMOND

## 2017-03-02 NOTE — Op Note (Signed)
PATIENT ID:      Kristopher Wilson  MRN:     [id number] DOB/AGE:    [birth year] / 81 y.o.       OPERATIVE REPORT    DATE OF PROCEDURE:  03/02/2017       PREOPERATIVE DIAGNOSIS:   LEFT DISTAL FIBULA FRACTURE WIT DELTOID LIGAMENT TEAR                                                       Estimated body mass index is 27 kg/m as calculated from the following:   Height as of this encounter: 6' 3"  (1.905 m).   Weight as of this encounter: 216 lb (98 kg).     POSTOPERATIVE DIAGNOSIS:   LEFT DISTAL FIBULA FRACTURE WIT DELTOID LIGAMENT TEAR                                                                     Estimated body mass index is 27 kg/m as calculated from the following:   Height as of this encounter: 6' 3"  (1.905 m).   Weight as of this encounter: 216 lb (98 kg).     PROCEDURE:  Procedure(s): OPEN REDUCTION INTERNAL FIXATION (ORIF) LEFT DISTAL FIBULA FRACTURE WITH DELTOID LIGAMENT REPAIR      SURGEON:  Joni Fears, MD    ASSISTANT:   Biagio Borg, PA-C   (Present and scrubbed throughout the case, critical for assistance with exposure, retraction, instrumentation, and closure.)          ANESTHESIA: regional and general     DRAINS: none :      TOURNIQUET TIME:    COMPLICATIONS:  None   CONDITION:  stable  PROCEDURE IN DETAIL: [id number]   [name] 03/02/2017, 3:05 PM

## 2017-03-02 NOTE — Anesthesia Procedure Notes (Signed)
Procedure Name: LMA Insertion Date/Time: 03/02/2017 1:28 PM Performed by: Neldon Newport Pre-anesthesia Checklist: Timeout performed, Patient being monitored, Suction available, Emergency Drugs available and Patient identified Patient Re-evaluated:Patient Re-evaluated prior to inductionOxygen Delivery Method: Circle system utilized Preoxygenation: Pre-oxygenation with 100% oxygen Intubation Type: IV induction Ventilation: Mask ventilation without difficulty LMA: LMA inserted LMA Size: 5.0 Number of attempts: 1 Placement Confirmation: breath sounds checked- equal and bilateral and positive ETCO2 Tube secured with: Tape Dental Injury: Teeth and Oropharynx as per pre-operative assessment

## 2017-03-03 ENCOUNTER — Encounter (HOSPITAL_COMMUNITY): Payer: Self-pay | Admitting: Orthopaedic Surgery

## 2017-03-03 NOTE — Op Note (Signed)
NAME:  MARCELLES, CLINARD                    ACCOUNT NO.:  MEDICAL RECORD NO.:  [id number]  LOCATION:                                 FACILITY:  PHYSICIAN:  Vonna Kotyk. Durward Fortes, M.D.    DATE OF BIRTH:  DATE OF PROCEDURE:  03/02/2017 DATE OF DISCHARGE:                              OPERATIVE REPORT   PREOPERATIVE DIAGNOSES:  Displaced bimalleolar component of trimalleolar fracture of left ankle including a long spiral displaced fibular fracture and rupture of the deltoid ligament.  POSTOPERATIVE DIAGNOSES:  Displaced bimalleolar component of trimalleolar fracture of left ankle including a long spiral displaced fibular fracture and rupture of the deltoid ligament.  OPERATION: 1. Open reduction and internal fixation of displaced left distal     fibula fracture. 2. Open repair of deltoid ligament.  SURGEON:  Vonna Kotyk. Durward Fortes, M.D.  ASSISTANT:  Aaron Edelman D. Petrarca, PA-C.  ANESTHESIA:  General with supplemental popliteal and ankle block.  COMPLICATIONS:  None.  HISTORY:  A 81 year old gentleman injured his ankle on Thursday evening. He was seen in the emergency room with x-rays consistent with a trimalleolar fracture of the left ankle.  He had a displaced distal fibular fracture with widening of the ankle mortise consistent with a tear of the deltoid ligament.  He has been immobilized in a splint and is comfortable.  He is now to have open reduction and internal fixation.  DESCRIPTION OF PROCEDURE:  Mr. Kristopher Wilson was met in the holding area with his wife, identified the left lower extremity as appropriate operative site, and marked it accordingly.  Anesthesia performed a popliteal and ankle block.  The patient was then transported to room #9 and placed under general anesthesia without difficulty.  A time-out was called.  A tourniquet was applied to the left thigh.  It was then elevated and scrubbed with chlorhexidine scrub and DuraPrep from the tips of the toes to the knee.  Sterile  draping was performed.  Time-out was called.  With the extremity elevated, Esmarch exsanguinated with a proximal tourniquet at 325 mmHg.  First procedure was performed along the distal fibula.  A longitudinal incision was made along the distal third of the fibula via sharp dissection, carried down through the subcutaneous tissue.  There was abundant hematoma as the patient had been on Plavix and aspirin.  This was evacuated.  Soft tissue was then bluntly elevated off the fracture. A periosteal elevator was used to elevate the periosteum from the distal fibula for better visualization.  On direct visualization, the fracture was reduced and maintained with a bone clamp.  We applied a single fixation screw perpendicular to the fracture.  Over drilling the proximal cortex and under drilling the distal cortex, I inserted a single screw with good position.  We then used the Biomet fracture plate system using the locking plate technology.  The 8-hole plate was then affixed to the side of the fibula and bent to conform to its shape.  We inserted 3 screws proximally and 4 distally with appropriate drilling, measuring, and filling with locking screws distally and nonlocking screws proximally.  We had excellent position using the small image intensifier with complete reduction  of the fracture and the ankle mortise.  The wound was then irrigated with saline solution.  We closed the fascia over the plate with a running 0 Vicryl.  We did visualize the peroneal nerve and carefully protected.  The subcutaneous was closed with 3-0 Monocryl and the skin closed with skin clips.  I then made a slightly curvilinear incision over the deltoid ligament and via sharp dissection, carried down through the subcutaneous tissue. The hematoma was evacuated.  A small piece of bone from the distal medial malleolus had avulsed with attached deltoid ligament.  This was reattached using the JuggerKnot Biomet anchor.  This  was inserted into the bone and then sutured around the small avulsed fragment of bone in the deltoid ligament and then sutured in place.  I supplemented this with several other FiberWire stitches along the deltoid ligament.  There was some small fibers that invaginated into the joint and these were reduced.  Posterior tibial tendon was also identified and intact.  Wound was irrigated with saline solution.  The subcutaneous was closed with 3- 0 Monocryl.  Skin was closed with skin clips.  Sterile bulky dressing was applied.  We did inject Xylocaine without epinephrine into the wounds.  A bulky dressing with a posterior splint, ACE bandage applied. Tourniquet was deflated with immediate capillary refill of the toes.  PLAN:  Oxycodone for pain, crutches, elevation.  Office in 1 week.     Vonna Kotyk. Durward Fortes, M.D.     PWW/MEDQ  D:  03/02/2017  T:  03/02/2017  Job:  [id number]

## 2017-03-12 ENCOUNTER — Ambulatory Visit (INDEPENDENT_AMBULATORY_CARE_PROVIDER_SITE_OTHER): Payer: Federal, State, Local not specified - PPO | Admitting: Orthopaedic Surgery

## 2017-03-12 ENCOUNTER — Encounter (INDEPENDENT_AMBULATORY_CARE_PROVIDER_SITE_OTHER): Payer: Self-pay | Admitting: Orthopaedic Surgery

## 2017-03-12 ENCOUNTER — Ambulatory Visit (INDEPENDENT_AMBULATORY_CARE_PROVIDER_SITE_OTHER): Payer: Self-pay

## 2017-03-12 VITALS — BP 128/74 | HR 83 | Resp 14 | Ht 75.0 in | Wt 219.0 lb

## 2017-03-12 DIAGNOSIS — M25572 Pain in left ankle and joints of left foot: Secondary | ICD-10-CM | POA: Diagnosis not present

## 2017-03-12 NOTE — Progress Notes (Signed)
Post-Op Visit Note   Patient: Kristopher Wilson           Date of Birth: March 20, 1936           MRN: [id number] Visit Date: 03/12/2017 PCP: Dorothyann Peng, NP   Assessment & Plan:  Chief Complaint:  Chief Complaint  Patient presents with  . Left Ankle - Routine Post Op    Mr. Deutscher is 10 days status post OPEN REDUCTION INTERNAL FIXATION (ORIF) FIBULA FRACTURE. He relates he takes tylenol   Visit Diagnoses:  1. Pain in left ankle and joints of left foot   10 days status post open reduction internal fixation of displaced left distal fibula fracture. Deltoid ligament was repaired. Doing well ambulating with a kneeling walker  Plan: Remove staples and apply Steri-Strips to wounds. Equalizer boot. Range of motion exercises. Office 2 weeks and continued nonweightbearing. Patient now back on Plavix. Not taking any pain medicine .no change in sensory exam from preoperative status. Patient has peripheral neuropathy  Follow-Up Instructions: No Follow-up on file.   Orders:  Orders Placed This Encounter  Procedures  . XR Ankle Complete Left   No orders of the defined types were placed in this encounter.   Imaging: Xr Ankle Complete Left  Result Date: 03/12/2017 Films of the left ankle reveal excellent position of the ankle mortise. This been interval fixation of the distal fibula with plate and screws. The plate and screws are in good position. Posterior plafond fracture is in good position.   PMFS History: Patient Active Problem List   Diagnosis Date Noted  . Displaced fracture of distal end of left fibula 03/02/2017  . Tear of deltoid ligament of ankle 03/02/2017  . Routine general medical examination at a health care facility 02/06/2016  . Lumbosacral radiculopathy due to herpes zoster 12/10/2015  . Neck pain on right side 07/22/2015  . Long term current use of antithrombotics/antiplatelets - clopidogrel and ASA 03/28/2015  . Left shoulder pain 05/31/2014  . Restless leg  syndrome 11/14/2013  . Night sweats 09/12/2013  . Hyperlipidemia 12/18/2010  . Pain in the chest 08/11/2010  . VENOUS INSUFFICIENCY 07/28/2010  . 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 ULCERATIVE COLITIS, LEFT SIDED 12/27/2009  . COLONIC POLYPS, ADENOMATOUS, HX OF 12/27/2009  . BACK PAIN, LUMBAR 10/18/2009  . GERD 04/18/2008  . BENIGN PAROXYSMAL POSITIONAL VERTIGO 09/30/2007  . Essential hypertension 07/07/2007  . CORONARY ARTERY DISEASE 07/07/2007  . PROSTATE CANCER, HX OF 07/07/2007   Past Medical History:  Diagnosis Date  . BACK PAIN, LUMBAR 10/18/2009  . Benign paroxysmal positional vertigo 09/30/2007  . CAD (coronary artery disease)   . Cancer [facility])    prostate   . CHF (congestive heart failure) ([place])   . CKD (chronic kidney disease)   . COLONIC POLYPS, ADENOMATOUS, HX OF   . GERD (gastroesophageal reflux disease)   . Herpes zoster with other nervous system complications(053.19)   . Hyperlipidemia   . Hypertension   . Hyperthyroidism    pt denies  . Loss of hearing    Bil/has hearing aids in both ears  . Nerve damage of left foot   . PERIPHERAL NEUROPATHY 10/11/2007  . 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 PROSTATE CANCER, HX OF 07/07/2007   s/p prostatectomy  . RLS (restless legs syndrome)   . Ulcer   . ULCERATIVE COLITIS, LEFT SIDED 2000  . URTICARIA, ALLERGIC 08/05/2009  . VENOUS INSUFFICIENCY 07/28/2010    Family History  Problem Relation Age of  Onset  . Heart attack Father   . Throat cancer Brother   . Multiple sclerosis Brother   . Multiple sclerosis Son   . Diabetes Maternal Uncle        x 2  . Diabetes Maternal Uncle   . Colon cancer Neg Hx   . Stroke Neg Hx     Past Surgical History:  Procedure Laterality Date  . ANGIOPLASTY  1990's/2004   stent placement 2  . COLONOSCOPY W/ BIOPSIES AND POLYPECTOMY  06/06/2009   adenomatous polyp, diverticulosis, colitis  . INGUINAL HERNIA REPAIR     x x  . ORIF FIBULA FRACTURE Left 03/02/2017    Procedure: OPEN REDUCTION INTERNAL FIXATION (ORIF) LEFT DISTAL FIBULA FRACTURE WITH DELTOID LIGAMENT REPAIR;  Surgeon: [name], MD;  Location: [facility];  Service: Orthopedics;  Laterality: Left;  . PROSTATECTOMY    . RETINAL LASER PROCEDURE Right 2016 & 2017   . ROTATOR CUFF REPAIR Left   . TONSILLECTOMY     Social History   Occupational History  . retired Retired  . [employer]    Social History Main Topics  . Smoking status: Former Smoker    Types: Cigarettes    Quit date: 09/08/1975  . Smokeless tobacco: Never Used  . Alcohol use 9.0 oz/week    15 Standard drinks or equivalent per week     Comment: 1-2 "high balls" daily  . Drug use: No  . Sexual activity: Not on file

## 2017-03-26 ENCOUNTER — Ambulatory Visit (INDEPENDENT_AMBULATORY_CARE_PROVIDER_SITE_OTHER): Payer: Federal, State, Local not specified - PPO | Admitting: Orthopaedic Surgery

## 2017-03-26 ENCOUNTER — Encounter (INDEPENDENT_AMBULATORY_CARE_PROVIDER_SITE_OTHER): Payer: Self-pay | Admitting: Orthopaedic Surgery

## 2017-03-26 VITALS — Resp 14 | Ht 75.0 in | Wt 219.0 lb

## 2017-03-26 DIAGNOSIS — S82842S Displaced bimalleolar fracture of left lower leg, sequela: Secondary | ICD-10-CM

## 2017-03-26 NOTE — Progress Notes (Signed)
Post-Op Visit Note   Patient: Kristopher Wilson           Date of Birth: [birth year]           MRN: [id number] Visit Date: 03/26/2017 PCP: Dorothyann Peng, NP   Assessment & Plan:  Chief Complaint:  Chief Complaint  Patient presents with  . Left Foot - Routine Post Op    Mr. Wilczak is 3 1/2 weeks status post open reduction internal fixation of displaced left distal fibula. He ambulates on a  Scooter. Take tylenol.    Visit Diagnoses:  1. Ankle fracture, bimalleolar, closed, left, sequela    3 weeks status post open reduction internal fixation of a displaced left fibula fracture and repair of deltoid ligament. Doing well. Not had any pain medicine over a week. He is using the equalizer boot and kneeling walker Plan: range of motion exercises out of the equalizer boot. Minimal weightbearing. Office 2 weeks. Obtain new films at that point Left ankle and incisions are healing nicely. Does have altered sensibility in his foot that is similar to what he had preoperatively did have a preop foot drop. Okay to drive if comfortable but must wear the equalizer boot to the left foot Follow-Up Instructions: Return in about 2 weeks (around 04/09/2017).   Orders:  No orders of the defined types were placed in this encounter.  No orders of the defined types were placed in this encounter.   Imaging: No results found.  PMFS History: Patient Active Problem List   Diagnosis Date Noted  . Displaced fracture of distal end of left fibula 03/02/2017  . Tear of deltoid ligament of ankle 03/02/2017  . Routine general medical examination at a health care facility 02/06/2016  . Lumbosacral radiculopathy due to herpes zoster 12/10/2015  . Neck pain on right side 07/22/2015  . Long term current use of antithrombotics/antiplatelets - clopidogrel and ASA 03/28/2015  . Left shoulder pain 05/31/2014  . Restless leg syndrome 11/14/2013  . Night sweats 09/12/2013  . Hyperlipidemia 12/18/2010  . Pain in the  chest 08/11/2010  . VENOUS INSUFFICIENCY 07/28/2010  . 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 ULCERATIVE COLITIS, LEFT SIDED 12/27/2009  . COLONIC POLYPS, ADENOMATOUS, HX OF 12/27/2009  . BACK PAIN, LUMBAR 10/18/2009  . GERD 04/18/2008  . BENIGN PAROXYSMAL POSITIONAL VERTIGO 09/30/2007  . Essential hypertension 07/07/2007  . CORONARY ARTERY DISEASE 07/07/2007  . PROSTATE CANCER, HX OF 07/07/2007   Past Medical History:  Diagnosis Date  . BACK PAIN, LUMBAR 10/18/2009  . Benign paroxysmal positional vertigo 09/30/2007  . CAD (coronary artery disease)   . Cancer [facility])    prostate   . CHF (congestive heart failure) ([place])   . CKD (chronic kidney disease)   . COLONIC POLYPS, ADENOMATOUS, HX OF   . GERD (gastroesophageal reflux disease)   . Herpes zoster with other nervous system complications(053.19)   . Hyperlipidemia   . Hypertension   . Hyperthyroidism    pt denies  . Loss of hearing    Bil/has hearing aids in both ears  . Nerve damage of left foot   . PERIPHERAL NEUROPATHY 10/11/2007  . 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 PROSTATE CANCER, HX OF 07/07/2007   s/p prostatectomy  . RLS (restless legs syndrome)   . Ulcer   . ULCERATIVE COLITIS, LEFT SIDED 2000  . URTICARIA, ALLERGIC 08/05/2009  . VENOUS INSUFFICIENCY 07/28/2010    Family History  Problem Relation Age of Onset  . Heart attack Father   .  Throat cancer Brother   . Multiple sclerosis Brother   . Multiple sclerosis Son   . Diabetes Maternal Uncle        x 2  . Diabetes Maternal Uncle   . Colon cancer Neg Hx   . Stroke Neg Hx     Past Surgical History:  Procedure Laterality Date  . ANGIOPLASTY  1990's/2004   stent placement 2  . COLONOSCOPY W/ BIOPSIES AND POLYPECTOMY  06/06/2009   adenomatous polyp, diverticulosis, colitis  . INGUINAL HERNIA REPAIR     x x  . ORIF FIBULA FRACTURE Left 03/02/2017   Procedure: OPEN REDUCTION INTERNAL FIXATION (ORIF) LEFT DISTAL FIBULA FRACTURE WITH DELTOID LIGAMENT  REPAIR;  Surgeon: [name], MD;  Location: [place];  Service: Orthopedics;  Laterality: Left;  . PROSTATECTOMY    . RETINAL LASER PROCEDURE Right 2016 & 2017   . ROTATOR CUFF REPAIR Left   . TONSILLECTOMY     Social History   Occupational History  . retired Retired  . [employer]    Social History Main Topics  . Smoking status: Former Smoker    Types: Cigarettes    Quit date: 09/08/1975  . Smokeless tobacco: Never Used  . Alcohol use 9.0 oz/week    15 Standard drinks or equivalent per week     Comment: 1-2 "high balls" daily  . Drug use: No  . Sexual activity: Not on file

## 2017-04-05 ENCOUNTER — Ambulatory Visit (INDEPENDENT_AMBULATORY_CARE_PROVIDER_SITE_OTHER): Payer: Federal, State, Local not specified - PPO | Admitting: Nurse Practitioner

## 2017-04-05 ENCOUNTER — Encounter: Payer: Self-pay | Admitting: Nurse Practitioner

## 2017-04-05 VITALS — BP 128/78 | HR 78 | Ht 75.0 in | Wt 206.0 lb

## 2017-04-05 DIAGNOSIS — E7800 Pure hypercholesterolemia, unspecified: Secondary | ICD-10-CM

## 2017-04-05 DIAGNOSIS — R0789 Other chest pain: Secondary | ICD-10-CM | POA: Diagnosis not present

## 2017-04-05 DIAGNOSIS — I2510 Atherosclerotic heart disease of native coronary artery without angina pectoris: Secondary | ICD-10-CM

## 2017-04-05 DIAGNOSIS — I10 Essential (primary) hypertension: Secondary | ICD-10-CM

## 2017-04-05 LAB — LIPID PANEL
Chol/HDL Ratio: 3.6 ratio (ref 0.0–5.0)
Cholesterol, Total: 138 mg/dL (ref 100–199)
HDL: 38 mg/dL — ABNORMAL LOW (ref >39)
LDL Calculated: 50 mg/dL (ref 0–99)
Triglycerides: 250 mg/dL — ABNORMAL HIGH (ref 0–149)
VLDL Cholesterol Cal: 50 mg/dL — ABNORMAL HIGH (ref 5–40)

## 2017-04-05 MED ORDER — CLOPIDOGREL BISULFATE 75 MG PO TABS: 75.0000 mg | ORAL_TABLET | Freq: Every day | ORAL | 3 refills | Status: DC

## 2017-04-05 NOTE — Progress Notes (Signed)
CARDIOLOGY OFFICE NOTE  Date:  04/05/2017    Kristopher Wilson Date of Birth: 1936/08/13 Medical Record #[id number]  PCP:  Dorothyann Peng, NP  Cardiologist:  Servando Snare     Chief Complaint  Patient presents with  . Coronary Artery Disease    Follow up visit     History of Present Illness: Kristopher Wilson is a 81 y.o. male who presents today for a follow up visit. Seen for Dr. Aundra Dubin.   He has a history of CAD, ulcerative colitis, and shingles with post-herpetic neuralgia. Patient had PCI in 2000 with BMS to LAD and repeat PCI in 2009 with DES to PLV. ETT-myoview in 3/16 showed no evidence for ischemia or infarction. His activity has been limited by a left leg neuropathy that has been thought to be due to post-herpetic neuralgia. He has been seen by neurology for this. He wears an orthotic on the left leg because of foot drop.He remains on aspirin and Plavix.   Last seen by Dr. [name] 05/2012.   Saw Richardson Dopp PA in December of 2015 for surgical clearance for L shoulder rotator cuff surgery with Dr. Durward Fortes 09/13/14.He was doing well.  I saw him back in March of 2016 - he was having atypical chest pain. BP was up. We updated his Myoview - this turned out ok. He was going to monitor his BP at home. When seen back for follow up - he was doing well. BP improved. Really likes his salt. His BP cuff was about 10 points higher than here in the office.   I saw him back in October of 2016 - was having chest pain - difficult to sort out. Still getting too much salt. Since he had had a recent Myoview - I placed him on Imdur and we elected to follow medically. He wanted to try and be managed medically. He was improved at his follow up in November of 2016. I last saw him in March of 2017 - was doing well with no real issue.   Just seen last month for his routine check - his chest pain had returned - he wanted to continue with medical management. Imdur was increased. CV risk  factor modification encouraged. Then broke his ankle and needed a pre op clearance - I put him at increased risk. This was done on aspirin. He did well with his surgery.   Comes back today. Here with his wife today. Still recovering from his ankle fracture. Unable to bear weight.  Using a scooter to ambulate. Fortunately, no more chest pain. He tells me he has also stopped all alcohol due to his quite elevated triglycerides. He does like "white foods" and carbs. Breathing is ok.   Past Medical History:  Diagnosis Date  . BACK PAIN, LUMBAR 10/18/2009  . Benign paroxysmal positional vertigo 09/30/2007  . CAD (coronary artery disease)   . Cancer [facility])    prostate   . CHF (congestive heart failure) ([place])   . CKD (chronic kidney disease)   . COLONIC POLYPS, ADENOMATOUS, HX OF   . GERD (gastroesophageal reflux disease)   . Herpes zoster with other nervous system complications(053.19)   . Hyperlipidemia   . Hypertension   . Hyperthyroidism    pt denies  . Loss of hearing    Bil/has hearing aids in both ears  . Nerve damage of left foot   . PERIPHERAL NEUROPATHY 10/11/2007  . 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 PROSTATE CANCER, HX OF 07/07/2007  s/p prostatectomy  . RLS (restless legs syndrome)   . Ulcer   . ULCERATIVE COLITIS, LEFT SIDED 2000  . URTICARIA, ALLERGIC 08/05/2009  . VENOUS INSUFFICIENCY 07/28/2010    Past Surgical History:  Procedure Laterality Date  . ANGIOPLASTY  1990's/2004   stent placement 2  . COLONOSCOPY W/ BIOPSIES AND POLYPECTOMY  06/06/2009   adenomatous polyp, diverticulosis, colitis  . INGUINAL HERNIA REPAIR     x x  . ORIF FIBULA FRACTURE Left 03/02/2017   Procedure: OPEN REDUCTION INTERNAL FIXATION (ORIF) LEFT DISTAL FIBULA FRACTURE WITH DELTOID LIGAMENT REPAIR;  Surgeon: [name], MD;  Location: [place];  Service: Orthopedics;  Laterality: Left;  . PROSTATECTOMY    . RETINAL LASER PROCEDURE Right 2016 & 2017   . ROTATOR CUFF REPAIR Left   .  TONSILLECTOMY       Medications: Current Meds  Medication Sig  . aspirin 81 MG tablet Take 81 mg by mouth daily.    Marland Kitchen atorvastatin (LIPITOR) 40 MG tablet Take 40 mg by mouth daily.  . carvedilol (COREG) 12.5 MG tablet Take 12.5 mg by mouth 2 (two) times daily with a meal.  . clopidogrel (PLAVIX) 75 MG tablet Take 1 tablet (75 mg total) by mouth daily.  . isosorbide mononitrate (IMDUR) 30 MG 24 hr tablet Take 30 mg by mouth daily.  . meclizine (ANTIVERT) 12.5 MG tablet TAKE 1 TABLET (12.5 MG TOTAL) BY MOUTH 2 (TWO) TIMES DAILY AS NEEDED FOR DIZZINESS.  . nitroGLYCERIN (NITROSTAT) 0.4 MG SL tablet Place 0.4 mg under the tongue every 5 (five) minutes as needed for chest pain.  Marland Kitchen oxyCODONE-acetaminophen (ROXICET) 5-325 MG tablet Take 1 tablet by mouth every 4 (four) hours as needed for severe pain.  . pantoprazole (PROTONIX) 40 MG tablet Take 1 tablet (40 mg total) by mouth daily.  . pramipexole (MIRAPEX) 1 MG tablet Take 1 tablet (1 mg total) by mouth at bedtime.  . [DISCONTINUED] clopidogrel (PLAVIX) 75 MG tablet Take 1 tablet (75 mg total) by mouth daily.     Allergies: Allergies  Allergen Reactions  . Cephalexin Hives    Social History: The patient  reports that he quit smoking about 41 years ago. His smoking use included Cigarettes. He has never used smokeless tobacco. He reports that he drinks about 9.0 oz of alcohol per week . He reports that he does not use drugs.   Family History: The patient's family history includes Diabetes in his maternal uncle and maternal uncle; Heart attack in his father; Multiple sclerosis in his brother and son; Throat cancer in his brother.   Review of Systems: Please see the history of present illness.   Otherwise, the review of systems is positive for none.   All other systems are reviewed and negative.   Physical Exam: VS:  BP 128/78   Pulse 78   Ht 6\' 3"  (1.905 m)   Wt 206 lb (93.4 kg)   SpO2 97%   BMI 25.75 kg/m  .  BMI Body mass index  is 25.75 kg/m.  Wt Readings from Last 3 Encounters:  04/05/17 206 lb (93.4 kg)  03/26/17 219 lb (99.3 kg)  03/12/17 219 lb (99.3 kg)    General: Pleasant. Well developed, well nourished and in no acute distress.   HEENT: Normal.  Neck: Supple, no JVD, carotid bruits, or masses noted.  Cardiac: Regular rate and rhythm. No murmurs, rubs, or gallops. No edema.  Respiratory:  Lungs are clear to auscultation bilaterally with normal work  of breathing.  GI: Soft and nontender.  MS: No deformity or atrophy. Gait and ROM intact. He is using a scooter. His left leg is in a boot up to the knee.  Skin: Warm and dry. Color is normal.  Neuro:  Strength and sensation are intact and no gross focal deficits noted.  Psych: Alert, appropriate and with normal affect.   LABORATORY DATA:  EKG:  EKG is not ordered today.  Lab Results  Component Value Date   WBC 7.8 03/02/2017   HGB 11.2 (L) 03/02/2017   HCT 35.4 (L) 03/02/2017   PLT 273 03/02/2017   GLUCOSE 115 (H) 03/02/2017   CHOL 255 (H) 02/25/2017   TRIG 601 (HH) 02/25/2017   HDL 40 02/25/2017   LDLDIRECT 68.0 01/30/2016   LDLCALC Comment 02/25/2017   ALT 20 02/25/2017   AST 14 02/25/2017   NA 137 03/02/2017   K 4.4 03/02/2017   CL 104 03/02/2017   CREATININE 1.29 (H) 03/02/2017   BUN 14 03/02/2017   CO2 24 03/02/2017   TSH 2.17 01/30/2016   PSA 0.21 01/30/2016   INR 0.9 02/29/2008   HGBA1C 5.7 10/11/2007     BNP (last 3 results) No results for input(s): BNP in the last 8760 hours.  ProBNP (last 3 results)  Recent Labs  04/21/16 1704  PROBNP 28.0     Other Studies Reviewed Today:  Myoview Impression from 11/2014 Exercise Capacity: Fair exercise capacity. BP Response: Hypertensive blood pressure response. Clinical Symptoms: No chest pain. ECG Impression: No significant ST segment change suggestive of ischemia. Comparison with Prior Nuclear Study: Previous scan is normal    Overall Impression: Normal stress  nuclear study.  LV Ejection Fraction: 70%. LV Wall Motion: NL LV Function; NL Wall Motion   Dorris Carnes    Assessment/Plan:  1. Chest pain - has known CAD with remote PCI - last Myoview was normal. He is managed medically - increased his Imdur at last visit - no more chest pain at this time. Unfortunately, not really able to exercise at this time.   2. Coronary Artery Disease: Continue ASA, Plavix, beta blocker, statin.Plavix refilled today.   3. Hypertension: BP ok here today. No changes made.   4. Hyperlipidemia: Continue statin. Rechecking lipids today - he is off alcohol but admits he could do better with his diet.   Current medicines are reviewed with the patient today.  The patient does not have concerns regarding medicines other than what has been noted above.  The following changes have been made:  See above.  Labs/ tests ordered today include:    Orders Placed This Encounter  Procedures  . Lipid panel     Disposition:   FU with me tentatively in 6 months with fasting labs.    Patient is agreeable to this plan and will call if any problems develop in the interim.   SignedTruitt Merle, NP  04/05/2017 8:08 AM  [facility] [address] [place], [place]  [address] Phone: [phone] Fax: [phone]

## 2017-04-05 NOTE — Patient Instructions (Addendum)
We will be checking the following labs today - Lipids  Medication Instructions:    Continue with your current medicines.   I refilled your Plavix today.     Testing/Procedures To Be Arranged:  N/A  Follow-Up:   See me in 6 months with fasting labs    Other Special Instructions:   Think about what we talked about today.     If you need a refill on your cardiac medications before your next appointment, please call your pharmacy.   Call the [place] office at [phone] if you have any questions, problems or concerns.

## 2017-04-09 ENCOUNTER — Ambulatory Visit (INDEPENDENT_AMBULATORY_CARE_PROVIDER_SITE_OTHER): Payer: Federal, State, Local not specified - PPO | Admitting: Orthopaedic Surgery

## 2017-04-09 ENCOUNTER — Encounter (INDEPENDENT_AMBULATORY_CARE_PROVIDER_SITE_OTHER): Payer: Self-pay | Admitting: Orthopaedic Surgery

## 2017-04-09 VITALS — BP 112/64 | HR 62 | Resp 14 | Ht 75.0 in | Wt 219.0 lb

## 2017-04-09 DIAGNOSIS — S82842D Displaced bimalleolar fracture of left lower leg, subsequent encounter for closed fracture with routine healing: Secondary | ICD-10-CM

## 2017-04-09 NOTE — Progress Notes (Signed)
Office Visit Note   Patient: Kristopher Wilson           Date of Birth: 08/22/1936           MRN: [id number] Visit Date: 04/09/2017              Requested by: Dorothyann Peng, NP [place], [place] [address] PCP: Dorothyann Peng, NP   Assessment & Plan: Visit Diagnoses:  1. Closed bimalleolar fracture of left ankle with routine healing, subsequent encounter   5-1/2 weeks status post ORIF left ankle fracture and doing well.  Plan: Wedge we'll increase weightbearing left lower extremity using crutches and the equalizer boot. Return to the office in 2 weeks  Follow-Up Instructions: Return in about 2 weeks (around 04/23/2017).   Orders:  No orders of the defined types were placed in this encounter.  No orders of the defined types were placed in this encounter.     Procedures: No procedures performed   Clinical Data: No additional findings.   Subjective: Chief Complaint  Patient presents with  . Left Leg - Routine Post Op    Kristopher Wilson is status post 6 weeks L fibula FX. He relates his foot is still numb due to neuropathy  5-1/2 weeks status post ORIF left ankle fracture including reduction and fixation of fibula fracture and repair of ruptured deltoid ligament. He is actually doing quite well. he's been compliant with his postop regimen using the kneeling walker.HPI no longer taking oxycodone   Review of Systems   Objective: Vital Signs: BP 112/64   Pulse 62   Resp 14   Ht 6' 3"  (1.905 m)   Wt 219 lb (99.3 kg)   BMI 27.37 kg/m   Physical Exam  Ortho Exam incisions the medial and lateral aspect left ankle healing without problem. Mild edema as expected. Working on range of motion exercises to ankle. Some limitation but no pain. Has a pre-existing neuropathy of his left lower extremity with some weakness in dorsiflexion and some altered sensibility which is unchanged from his preoperative status. Good capillary refill to the foot.  Specialty  Comments:  No specialty comments available.  Imaging: No results found.   PMFS History: Patient Active Problem List   Diagnosis Date Noted  . Displaced fracture of distal end of left fibula 03/02/2017  . Tear of deltoid ligament of ankle 03/02/2017  . Routine general medical examination at a health care facility 02/06/2016  . Lumbosacral radiculopathy due to herpes zoster 12/10/2015  . Neck pain on right side 07/22/2015  . Long term current use of antithrombotics/antiplatelets - clopidogrel and ASA 03/28/2015  . Left shoulder pain 05/31/2014  . Restless leg syndrome 11/14/2013  . Night sweats 09/12/2013  . Hyperlipidemia 12/18/2010  . Pain in the chest 08/11/2010  . VENOUS INSUFFICIENCY 07/28/2010  . 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 ULCERATIVE COLITIS, LEFT SIDED 12/27/2009  . COLONIC POLYPS, ADENOMATOUS, HX OF 12/27/2009  . BACK PAIN, LUMBAR 10/18/2009  . GERD 04/18/2008  . BENIGN PAROXYSMAL POSITIONAL VERTIGO 09/30/2007  . Essential hypertension 07/07/2007  . CORONARY ARTERY DISEASE 07/07/2007  . PROSTATE CANCER, HX OF 07/07/2007   Past Medical History:  Diagnosis Date  . BACK PAIN, LUMBAR 10/18/2009  . Benign paroxysmal positional vertigo 09/30/2007  . CAD (coronary artery disease)   . Cancer [facility])    prostate   . CHF (congestive heart failure) ([place])   . CKD (chronic kidney disease)   . COLONIC POLYPS, ADENOMATOUS,  HX OF   . GERD (gastroesophageal reflux disease)   . Herpes zoster with other nervous system complications(053.19)   . Hyperlipidemia   . Hypertension   . Hyperthyroidism    pt denies  . Loss of hearing    Bil/has hearing aids in both ears  . Nerve damage of left foot   . PERIPHERAL NEUROPATHY 10/11/2007  . 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 PROSTATE CANCER, HX OF 07/07/2007   s/p prostatectomy  . RLS (restless legs syndrome)   . Ulcer   . ULCERATIVE COLITIS, LEFT SIDED 2000  . URTICARIA, ALLERGIC 08/05/2009  . VENOUS INSUFFICIENCY  07/28/2010    Family History  Problem Relation Age of Onset  . Heart attack Father   . Throat cancer Brother   . Multiple sclerosis Brother   . Multiple sclerosis Son   . Diabetes Maternal Uncle        x 2  . Diabetes Maternal Uncle   . Colon cancer Neg Hx   . Stroke Neg Hx     Past Surgical History:  Procedure Laterality Date  . ANGIOPLASTY  1990's/2004   stent placement 2  . COLONOSCOPY W/ BIOPSIES AND POLYPECTOMY  06/06/2009   adenomatous polyp, diverticulosis, colitis  . INGUINAL HERNIA REPAIR     x x  . ORIF FIBULA FRACTURE Left 03/02/2017   Procedure: OPEN REDUCTION INTERNAL FIXATION (ORIF) LEFT DISTAL FIBULA FRACTURE WITH DELTOID LIGAMENT REPAIR;  Surgeon: [name], MD;  Location: [place];  Service: Orthopedics;  Laterality: Left;  . PROSTATECTOMY    . RETINAL LASER PROCEDURE Right 2016 & 2017   . ROTATOR CUFF REPAIR Left   . TONSILLECTOMY     Social History   Occupational History  . retired Retired  . [employer]    Social History Main Topics  . Smoking status: Former Smoker    Types: Cigarettes    Quit date: 09/08/1975  . Smokeless tobacco: Never Used  . Alcohol use 9.0 oz/week    15 Standard drinks or equivalent per week     Comment: 1-2 "high balls" daily  . Drug use: No  . Sexual activity: Not on file     [name], MD   Note - This record has been created using [employer].  Chart creation errors have been sought, but may not always  have been located. Such creation errors do not reflect on  the standard of medical care.

## 2017-04-22 ENCOUNTER — Ambulatory Visit (INDEPENDENT_AMBULATORY_CARE_PROVIDER_SITE_OTHER): Payer: Federal, State, Local not specified - PPO | Admitting: Orthopaedic Surgery

## 2017-04-22 ENCOUNTER — Encounter (INDEPENDENT_AMBULATORY_CARE_PROVIDER_SITE_OTHER): Payer: Self-pay | Admitting: Orthopaedic Surgery

## 2017-04-22 ENCOUNTER — Ambulatory Visit (INDEPENDENT_AMBULATORY_CARE_PROVIDER_SITE_OTHER): Payer: Self-pay

## 2017-04-22 DIAGNOSIS — Z967 Presence of other bone and tendon implants: Secondary | ICD-10-CM

## 2017-04-22 DIAGNOSIS — Z9889 Other specified postprocedural states: Secondary | ICD-10-CM

## 2017-04-22 DIAGNOSIS — Z8781 Personal history of (healed) traumatic fracture: Secondary | ICD-10-CM

## 2017-04-22 NOTE — Progress Notes (Signed)
Office Visit Note   Patient: Kristopher Wilson           Date of Birth: Apr 13, 1936           MRN: [id number] Visit Date: 04/22/2017              Requested by: Dorothyann Peng, NP [address] [place], [place] [address] PCP: Dorothyann Peng, NP   Assessment & Plan: Visit Diagnoses:  1. S/P ORIF (open reduction internal fixation) fracture     Plan:  #1: Wean out of his Equalizer boot to his AFO and a crutch in the right arm. 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 He will try to do this and depending upon how he does we'll determine whether he continues.  Follow-Up Instructions: Return in about 3 weeks (around 05/13/2017).   Orders:  Orders Placed This Encounter  Procedures  . XR Ankle Complete Left   No orders of the defined types were placed in this encounter.     Procedures: No procedures performed   Clinical Data: No additional findings.   Subjective: 7 weeks status post open reduction internal fixation left ankle  HPI  Kristopher Wilson is 7 weeks and 2 days status post Open reduction and internal fixation of displaced left distal fibula fracture and Open repair of deltoid ligament. He is doing well overall. 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 Denies much in the way of pain this time. He preoperatively had problems with a left foot drop as well as neuropathy. This is not changed since his surgery.  Review of Systems   Objective: Vital Signs: There were no vitals taken for this visit.  Physical Exam  Ortho Exam  Today his wounds are well-healed. He does have little bit of motion in the tip talar joint. Little bit of diffuse edema.  Specialty Comments:  No specialty comments available.  Imaging: Xr Ankle Complete Left  Result Date: 04/22/2017 Three-view x-rays of the left ankle reveals fixation in place. No fixation problems noted. Good position  alignment of the fracture with callus formation is noted. Mortise is intact and in good position.    PMFS History: Patient Active Problem List   Diagnosis Date Noted  . Displaced fracture of distal end of left fibula 03/02/2017  . Tear of deltoid ligament of ankle 03/02/2017  . Routine general medical examination at a health care facility 02/06/2016  . Lumbosacral radiculopathy due to herpes zoster 12/10/2015  . Neck pain on right side 07/22/2015  . Long term current use of antithrombotics/antiplatelets - clopidogrel and ASA 03/28/2015  . Left shoulder pain 05/31/2014  . Restless leg syndrome 11/14/2013  . Night sweats 09/12/2013  . Hyperlipidemia 12/18/2010  . Pain in the chest 08/11/2010  . VENOUS INSUFFICIENCY 07/28/2010  . 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 ULCERATIVE COLITIS, LEFT SIDED 12/27/2009  . COLONIC POLYPS, ADENOMATOUS, HX OF 12/27/2009  . BACK PAIN, LUMBAR 10/18/2009  . GERD 04/18/2008  . BENIGN PAROXYSMAL POSITIONAL VERTIGO 09/30/2007  . Essential hypertension 07/07/2007  . CORONARY ARTERY DISEASE 07/07/2007  . PROSTATE CANCER, HX OF 07/07/2007   Past Medical History:  Diagnosis Date  . BACK PAIN, LUMBAR 10/18/2009  . Benign paroxysmal positional vertigo 09/30/2007  . CAD (coronary artery disease)   . Cancer [facility])    prostate   . CHF (  congestive heart failure) ([place])   . CKD (chronic kidney disease)   . COLONIC POLYPS, ADENOMATOUS, HX OF   . GERD (gastroesophageal reflux disease)   . Herpes zoster with other nervous system complications(053.19)   . Hyperlipidemia   . Hypertension   . Hyperthyroidism    pt denies  . Loss of hearing    Bil/has hearing aids in both ears  . Nerve damage of left foot   . PERIPHERAL NEUROPATHY 10/11/2007  . 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 PROSTATE CANCER, HX OF 07/07/2007   s/p prostatectomy  . RLS (restless legs syndrome)   . Ulcer   . ULCERATIVE COLITIS, LEFT SIDED 2000  . URTICARIA, ALLERGIC 08/05/2009    . VENOUS INSUFFICIENCY 07/28/2010    Family History  Problem Relation Age of Onset  . Heart attack Father   . Throat cancer Brother   . Multiple sclerosis Brother   . Multiple sclerosis Son   . Diabetes Maternal Uncle        x 2  . Diabetes Maternal Uncle   . Colon cancer Neg Hx   . Stroke Neg Hx     Past Surgical History:  Procedure Laterality Date  . ANGIOPLASTY  1990's/2004   stent placement 2  . COLONOSCOPY W/ BIOPSIES AND POLYPECTOMY  06/06/2009   adenomatous polyp, diverticulosis, colitis  . INGUINAL HERNIA REPAIR     x x  . ORIF FIBULA FRACTURE Left 03/02/2017   Procedure: OPEN REDUCTION INTERNAL FIXATION (ORIF) LEFT DISTAL FIBULA FRACTURE WITH DELTOID LIGAMENT REPAIR;  Surgeon: [name], MD;  Location: [facility];  Service: Orthopedics;  Laterality: Left;  . PROSTATECTOMY    . RETINAL LASER PROCEDURE Right 2016 & 2017   . ROTATOR CUFF REPAIR Left   . TONSILLECTOMY     Social History   Occupational History  . retired Retired  . [employer]    Social History Main Topics  . Smoking status: Former Smoker    Types: Cigarettes    Quit date: 09/08/1975  . Smokeless tobacco: Never Used  . Alcohol use 9.0 oz/week    15 Standard drinks or equivalent per week     Comment: 1-2 "high balls" daily  . Drug use: No  . Sexual activity: Not on file

## 2017-04-22 NOTE — Progress Notes (Deleted)
Office Visit Note   Patient: Kristopher Wilson           Date of Birth: 07/10/1936           MRN: [id number] Visit Date: 04/22/2017              Requested by: Dorothyann Peng, NP [address] [place], [place] [address] PCP: Dorothyann Peng, NP   Assessment & Plan: Visit Diagnoses:  1. S/P ORIF (open reduction internal fixation) fracture     Plan: ***  Follow-Up Instructions: No Follow-up on file.   Orders:  Orders Placed This Encounter  Procedures  . XR Ankle Complete Left   No orders of the defined types were placed in this encounter.     Procedures: No procedures performed   Clinical Data: No additional findings.   Subjective: No chief complaint on file.   HPI  Review of Systems  All other systems reviewed and are negative.    Objective: Vital Signs: There were no vitals taken for this visit.  Physical Exam  Ortho Exam  Specialty Comments:  No specialty comments available.  Imaging: No results found.   PMFS History: Patient Active Problem List   Diagnosis Date Noted  . Displaced fracture of distal end of left fibula 03/02/2017  . Tear of deltoid ligament of ankle 03/02/2017  . Routine general medical examination at a health care facility 02/06/2016  . Lumbosacral radiculopathy due to herpes zoster 12/10/2015  . Neck pain on right side 07/22/2015  . Long term current use of antithrombotics/antiplatelets - clopidogrel and ASA 03/28/2015  . Left shoulder pain 05/31/2014  . Restless leg syndrome 11/14/2013  . Night sweats 09/12/2013  . Hyperlipidemia 12/18/2010  . Pain in the chest 08/11/2010  . VENOUS INSUFFICIENCY 07/28/2010  . 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 ULCERATIVE COLITIS, LEFT SIDED 12/27/2009  . COLONIC POLYPS, ADENOMATOUS, HX OF 12/27/2009  . BACK PAIN, LUMBAR 10/18/2009  . GERD 04/18/2008  . BENIGN PAROXYSMAL POSITIONAL VERTIGO 09/30/2007  . Essential hypertension 07/07/2007  . CORONARY ARTERY DISEASE  07/07/2007  . PROSTATE CANCER, HX OF 07/07/2007   Past Medical History:  Diagnosis Date  . BACK PAIN, LUMBAR 10/18/2009  . Benign paroxysmal positional vertigo 09/30/2007  . CAD (coronary artery disease)   . Cancer [facility])    prostate   . CHF (congestive heart failure) ([place])   . CKD (chronic kidney disease)   . COLONIC POLYPS, ADENOMATOUS, HX OF   . GERD (gastroesophageal reflux disease)   . Herpes zoster with other nervous system complications(053.19)   . Hyperlipidemia   . Hypertension   . Hyperthyroidism    pt denies  . Loss of hearing    Bil/has hearing aids in both ears  . Nerve damage of left foot   . PERIPHERAL NEUROPATHY 10/11/2007  . 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 PROSTATE CANCER, HX OF 07/07/2007   s/p prostatectomy  . RLS (restless legs syndrome)   . Ulcer   . ULCERATIVE COLITIS, LEFT SIDED 2000  . URTICARIA, ALLERGIC 08/05/2009  . VENOUS INSUFFICIENCY 07/28/2010    Family History  Problem Relation Age of Onset  . Heart attack Father   . Throat cancer Brother   . Multiple sclerosis Brother   . Multiple sclerosis Son   . Diabetes Maternal Uncle        x 2  . Diabetes Maternal Uncle   . Colon cancer Neg Hx   . Stroke Neg Hx     Past  Surgical History:  Procedure Laterality Date  . ANGIOPLASTY  1990's/2004   stent placement 2  . COLONOSCOPY W/ BIOPSIES AND POLYPECTOMY  06/06/2009   adenomatous polyp, diverticulosis, colitis  . INGUINAL HERNIA REPAIR     x x  . ORIF FIBULA FRACTURE Left 03/02/2017   Procedure: OPEN REDUCTION INTERNAL FIXATION (ORIF) LEFT DISTAL FIBULA FRACTURE WITH DELTOID LIGAMENT REPAIR;  Surgeon: [name], MD;  Location: [facility];  Service: Orthopedics;  Laterality: Left;  . PROSTATECTOMY    . RETINAL LASER PROCEDURE Right 2016 & 2017   . ROTATOR CUFF REPAIR Left   . TONSILLECTOMY     Social History   Occupational History  . retired Retired  . [employer]    Social History Main Topics  . Smoking status: Former  Smoker    Types: Cigarettes    Quit date: 09/08/1975  . Smokeless tobacco: Never Used  . Alcohol use 9.0 oz/week    15 Standard drinks or equivalent per week     Comment: 1-2 "high balls" daily  . Drug use: No  . Sexual activity: Not on file

## 2017-05-07 ENCOUNTER — Ambulatory Visit (INDEPENDENT_AMBULATORY_CARE_PROVIDER_SITE_OTHER): Payer: Federal, State, Local not specified - PPO | Admitting: Orthopaedic Surgery

## 2017-05-07 DIAGNOSIS — S82832A Other fracture of upper and lower end of left fibula, initial encounter for closed fracture: Secondary | ICD-10-CM

## 2017-05-07 DIAGNOSIS — S93422D Sprain of deltoid ligament of left ankle, subsequent encounter: Secondary | ICD-10-CM

## 2017-05-07 NOTE — Progress Notes (Signed)
Office Visit Note   Patient: Kristopher Wilson           Date of Birth: 11-04-1935           MRN: [id number] Visit Date: 05/07/2017              Requested by: Dorothyann Peng, NP [address] [place], [place] [address] PCP: Dorothyann Peng, NP   Assessment & Plan: Visit Diagnoses:  1. Displaced fracture of distal end of left fibula   2. Tear of deltoid ligament of left ankle, subsequent encounter     Plan: 9 weeks postop and doing well. Increase activities as tolerated. Will wear the ankle-foot orthotic that he had prior to surgery and discontinue the equalizer boot. Does not need a cane or crutch. We'll see back in 1 month  Follow-Up Instructions: Return in about 1 month (around 06/06/2017).   Orders:  No orders of the defined types were placed in this encounter.  No orders of the defined types were placed in this encounter.     Procedures: No procedures performed   Clinical Data: No additional findings.   Subjective: No chief complaint on file. 9 weeks status post open reduction internal fixation of left fibula fracture associated with a tear of the deltoid ligament which was also repaired. Resection doing quite well. He plans to go back to work at [employer] sometime in the next month. He has a pre-existing neuropathy of his left lower extremity was some limited dorsiflexion sensibility of the foot and ankle. That's unchanged. He denies shortness of breath or chest pain fever or chills. Still some swelling of his ankle  HPI  Review of Systems   Objective: Vital Signs: There were no vitals taken for this visit.  Physical Exam  Ortho Exam left ankle incisions of healed without any problems. Mild edema about the ankle and foot. No change in neuro exam. No calf discomfort. Walks with just minimal limp.  Specialty Comments:  No specialty comments available.  Imaging: No results found.   PMFS History: Patient Active Problem List   Diagnosis Date Noted  .  Displaced fracture of distal end of left fibula 03/02/2017  . Tear of deltoid ligament of ankle 03/02/2017  . Routine general medical examination at a health care facility 02/06/2016  . Lumbosacral radiculopathy due to herpes zoster 12/10/2015  . Neck pain on right side 07/22/2015  . Long term current use of antithrombotics/antiplatelets - clopidogrel and ASA 03/28/2015  . Left shoulder pain 05/31/2014  . Restless leg syndrome 11/14/2013  . Night sweats 09/12/2013  . Hyperlipidemia 12/18/2010  . Pain in the chest 08/11/2010  . VENOUS INSUFFICIENCY 07/28/2010  . 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 ULCERATIVE COLITIS, LEFT SIDED 12/27/2009  . COLONIC POLYPS, ADENOMATOUS, HX OF 12/27/2009  . BACK PAIN, LUMBAR 10/18/2009  . GERD 04/18/2008  . BENIGN PAROXYSMAL POSITIONAL VERTIGO 09/30/2007  . Essential hypertension 07/07/2007  . CORONARY ARTERY DISEASE 07/07/2007  . PROSTATE CANCER, HX OF 07/07/2007   Past Medical History:  Diagnosis Date  . BACK PAIN, LUMBAR 10/18/2009  . Benign paroxysmal positional vertigo 09/30/2007  . CAD (coronary artery disease)   . Cancer [facility])    prostate   . CHF (congestive heart failure) ([place])   . CKD (chronic kidney disease)   . COLONIC POLYPS, ADENOMATOUS, HX OF   . GERD (gastroesophageal reflux disease)   . Herpes zoster with other nervous system complications(053.19)   . Hyperlipidemia   . Hypertension   .  Hyperthyroidism    pt denies  . Loss of hearing    Bil/has hearing aids in both ears  . Nerve damage of left foot   . PERIPHERAL NEUROPATHY 10/11/2007  . 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 PROSTATE CANCER, HX OF 07/07/2007   s/p prostatectomy  . RLS (restless legs syndrome)   . Ulcer   . ULCERATIVE COLITIS, LEFT SIDED 2000  . URTICARIA, ALLERGIC 08/05/2009  . VENOUS INSUFFICIENCY 07/28/2010    Family History  Problem Relation Age of Onset  . Heart attack Father   . Throat cancer Brother   . Multiple sclerosis Brother   .  Multiple sclerosis Son   . Diabetes Maternal Uncle        x 2  . Diabetes Maternal Uncle   . Colon cancer Neg Hx   . Stroke Neg Hx     Past Surgical History:  Procedure Laterality Date  . ANGIOPLASTY  1990's/2004   stent placement 2  . COLONOSCOPY W/ BIOPSIES AND POLYPECTOMY  06/06/2009   adenomatous polyp, diverticulosis, colitis  . INGUINAL HERNIA REPAIR     x x  . ORIF FIBULA FRACTURE Left 03/02/2017   Procedure: OPEN REDUCTION INTERNAL FIXATION (ORIF) LEFT DISTAL FIBULA FRACTURE WITH DELTOID LIGAMENT REPAIR;  Surgeon: [name], MD;  Location: [facility];  Service: Orthopedics;  Laterality: Left;  . PROSTATECTOMY    . RETINAL LASER PROCEDURE Right 2016 & 2017   . ROTATOR CUFF REPAIR Left   . TONSILLECTOMY     Social History   Occupational History  . retired Retired  . [employer]    Social History Main Topics  . Smoking status: Former Smoker    Types: Cigarettes    Quit date: 09/08/1975  . Smokeless tobacco: Never Used  . Alcohol use 9.0 oz/week    15 Standard drinks or equivalent per week     Comment: 1-2 "high balls" daily  . Drug use: No  . Sexual activity: Not on file     [name], MD   Note - This record has been created using [employer].  Chart creation errors have been sought, but may not always  have been located. Such creation errors do not reflect on  the standard of medical care.

## 2017-05-28 ENCOUNTER — Encounter: Payer: Self-pay | Admitting: Adult Health

## 2017-05-31 ENCOUNTER — Other Ambulatory Visit: Payer: Self-pay | Admitting: Adult Health

## 2017-05-31 DIAGNOSIS — E785 Hyperlipidemia, unspecified: Secondary | ICD-10-CM

## 2017-05-31 DIAGNOSIS — Z760 Encounter for issue of repeat prescription: Secondary | ICD-10-CM

## 2017-06-02 NOTE — Telephone Encounter (Signed)
Kristopher Wilson, looks like mesalamine was d/c.  No longer on active medication list.  Please advise.

## 2017-06-03 NOTE — Telephone Encounter (Signed)
Ok to refill 

## 2017-06-03 NOTE — Telephone Encounter (Signed)
Sent to the pharmacy by e-scribe for 90 days.  Pt now scheduled to see [facility] on 06/24/17 for CPX.

## 2017-06-07 ENCOUNTER — Ambulatory Visit (INDEPENDENT_AMBULATORY_CARE_PROVIDER_SITE_OTHER): Payer: Federal, State, Local not specified - PPO | Admitting: Orthopaedic Surgery

## 2017-06-14 ENCOUNTER — Encounter (INDEPENDENT_AMBULATORY_CARE_PROVIDER_SITE_OTHER): Payer: Self-pay | Admitting: Orthopaedic Surgery

## 2017-06-14 ENCOUNTER — Ambulatory Visit (INDEPENDENT_AMBULATORY_CARE_PROVIDER_SITE_OTHER): Payer: Federal, State, Local not specified - PPO | Admitting: Orthopaedic Surgery

## 2017-06-14 VITALS — BP 138/74 | HR 56 | Resp 14 | Ht 74.0 in | Wt 200.0 lb

## 2017-06-14 DIAGNOSIS — S93422D Sprain of deltoid ligament of left ankle, subsequent encounter: Secondary | ICD-10-CM

## 2017-06-14 DIAGNOSIS — S82832A Other fracture of upper and lower end of left fibula, initial encounter for closed fracture: Secondary | ICD-10-CM | POA: Diagnosis not present

## 2017-06-14 NOTE — Progress Notes (Deleted)
Office Visit Note   Patient: Kristopher Wilson           Date of Birth: 09/12/1935           MRN: [id number] Visit Date: 06/14/2017              Requested by: Dorothyann Peng, NP, NP [address] [place], [place] [address] PCP: Dorothyann Peng, NP   Assessment & Plan: Visit Diagnoses: No diagnosis found.  Plan: ***  Follow-Up Instructions: No Follow-up on file.   Orders:  No orders of the defined types were placed in this encounter.  No orders of the defined types were placed in this encounter.     Procedures: No procedures performed   Clinical Data: No additional findings.   Subjective: Chief Complaint  Patient presents with  . Left Lower Leg - Routine Post Op    Kristopher Wilson is a 81 y o S/P 68mo 3w ORIF L fibula fx    HPI  Review of Systems  Constitutional: Negative for fatigue.  HENT: Negative for hearing loss.   Respiratory: Negative for apnea, chest tightness and shortness of breath.   Cardiovascular: Negative for chest pain, palpitations and leg swelling.  Gastrointestinal: Negative for blood in stool, constipation and diarrhea.  Genitourinary: Negative for difficulty urinating.  Musculoskeletal: Positive for back pain. Negative for arthralgias, joint swelling, myalgias, neck pain and neck stiffness.  Neurological: Negative for weakness, numbness and headaches.  Hematological: Does not bruise/bleed easily.  Psychiatric/Behavioral: Negative for sleep disturbance. The patient is not nervous/anxious.      Objective: Vital Signs: BP 138/74   Pulse (!) 56   Resp 14   Ht 6\' 2"  (1.88 m)   Wt 200 lb (90.7 kg)   BMI 25.68 kg/m   Physical Exam  Ortho Exam  Specialty Comments:  No specialty comments available.  Imaging: No results found.   PMFS History: Patient Active Problem List   Diagnosis Date Noted  . Displaced fracture of distal end of left fibula 03/02/2017  . Tear of deltoid ligament of ankle 03/02/2017  . Routine general medical  examination at a health care facility 02/06/2016  . Lumbosacral radiculopathy due to herpes zoster 12/10/2015  . Neck pain on right side 07/22/2015  . Long term current use of antithrombotics/antiplatelets - clopidogrel and ASA 03/28/2015  . Left shoulder pain 05/31/2014  . Restless leg syndrome 11/14/2013  . Night sweats 09/12/2013  . Hyperlipidemia 12/18/2010  . Pain in the chest 08/11/2010  . VENOUS INSUFFICIENCY 07/28/2010  . 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 ULCERATIVE COLITIS, LEFT SIDED 12/27/2009  . COLONIC POLYPS, ADENOMATOUS, HX OF 12/27/2009  . BACK PAIN, LUMBAR 10/18/2009  . GERD 04/18/2008  . BENIGN PAROXYSMAL POSITIONAL VERTIGO 09/30/2007  . Essential hypertension 07/07/2007  . CORONARY ARTERY DISEASE 07/07/2007  . PROSTATE CANCER, HX OF 07/07/2007   Past Medical History:  Diagnosis Date  . BACK PAIN, LUMBAR 10/18/2009  . Benign paroxysmal positional vertigo 09/30/2007  . CAD (coronary artery disease)   . Cancer [facility])    prostate   . CHF (congestive heart failure) ([place])   . CKD (chronic kidney disease)   . COLONIC POLYPS, ADENOMATOUS, HX OF   . GERD (gastroesophageal reflux disease)   . Herpes zoster with other nervous system complications(053.19)   . Hyperlipidemia   . Hypertension   . Hyperthyroidism    pt denies  . Loss of hearing    Bil/has hearing aids in both ears  .  Nerve damage of left foot   . PERIPHERAL NEUROPATHY 10/11/2007  . 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 PROSTATE CANCER, HX OF 07/07/2007   s/p prostatectomy  . RLS (restless legs syndrome)   . Ulcer   . ULCERATIVE COLITIS, LEFT SIDED 2000  . URTICARIA, ALLERGIC 08/05/2009  . VENOUS INSUFFICIENCY 07/28/2010    Family History  Problem Relation Age of Onset  . Heart attack Father   . Throat cancer Brother   . Multiple sclerosis Brother   . Multiple sclerosis Son   . Diabetes Maternal Uncle        x 2  . Diabetes Maternal Uncle   . Colon cancer Neg Hx   . Stroke Neg Hx       Past Surgical History:  Procedure Laterality Date  . ANGIOPLASTY  1990's/2004   stent placement 2  . COLONOSCOPY W/ BIOPSIES AND POLYPECTOMY  06/06/2009   adenomatous polyp, diverticulosis, colitis  . INGUINAL HERNIA REPAIR     x x  . ORIF FIBULA FRACTURE Left 03/02/2017   Procedure: OPEN REDUCTION INTERNAL FIXATION (ORIF) LEFT DISTAL FIBULA FRACTURE WITH DELTOID LIGAMENT REPAIR;  Surgeon: [name], MD;  Location: [facility];  Service: Orthopedics;  Laterality: Left;  . PROSTATECTOMY    . RETINAL LASER PROCEDURE Right 2016 & 2017   . ROTATOR CUFF REPAIR Left   . TONSILLECTOMY     Social History   Occupational History  . retired Retired  . [employer]    Social History Main Topics  . Smoking status: Former Smoker    Types: Cigarettes    Quit date: 09/08/1975  . Smokeless tobacco: Never Used  . Alcohol use 9.0 oz/week    15 Standard drinks or equivalent per week     Comment: 1-2 "high balls" daily  . Drug use: No  . Sexual activity: Not on file

## 2017-06-14 NOTE — Progress Notes (Signed)
Office Visit Note   Patient: Kristopher Wilson           Date of Birth: 08-Sep-1935           MRN: [id number] Visit Date: 06/14/2017              Requested by: Dorothyann Peng, NP [place], [place] [address] PCP: Dorothyann Peng, NP   Assessment & Plan: Visit Diagnoses:  1. Displaced fracture of distal end of left fibula   2. Tear of deltoid ligament of left ankle, subsequent encounter     Plan: 15 weeks status post ORIF of the left distal fibula fracture in repair of the deltoid ligament. Doing well. Not sure why he has a rash he can check with his family physician or dermatologist if it continues to be a nuisance. We'll also apply an ASO ankle support as I think he will be more comfortable in it. We will plan to see back as needed.  Follow-Up Instructions: Return if symptoms worsen or fail to improve.   Orders:  No orders of the defined types were placed in this encounter.  No orders of the defined types were placed in this encounter.     Procedures: No procedures performed   Clinical Data: No additional findings.   Subjective: Chief Complaint  Patient presents with  . Left Lower Leg - Routine Post Op    Kristopher  Wilson is a 81 y o S/P 79mo3w ORIF L fibula fx  Denies any fever or chills. Has had some swelling of his left lower extremity. He had even preoperatively he does wear a support stocking. Walks without a problem. Back to work at [employer] Has developed a rash along the lateral aspect of his left foot  HPI  Review of Systems   Objective: Vital Signs: BP 138/74   Pulse (!) 56   Resp 14   Ht 6' 2"  (1.88 m)   Wt 200 lb (90.7 kg)   BMI 25.68 kg/m   Physical Exam  Ortho Exam awake alert and oriented 3 comfortable sitting. Doesn't appear to walk with a limp. Incisions about the left ankle of healed without complication. Neurologically this been no change from his preoperative status. He does have a dry rash along the lateral aspect of his  ankle and his hindfoot that was not read there was no drainage there is no ecchymosis some areas of dry flaky skin. No pain about either wound. No evidence of instability. No calf pain. Mild pitting edema about his lower aspect of his leg and ankle is have weakened dorsiflexion from preoperative status and capillary refill to toes. No knee pain  Specialty Comments:  No specialty comments available.  Imaging: No results found.   PMFS History: Patient Active Problem List   Diagnosis Date Noted  . Displaced fracture of distal end of left fibula 03/02/2017  . Tear of deltoid ligament of ankle 03/02/2017  . Routine general medical examination at a health care facility 02/06/2016  . Lumbosacral radiculopathy due to herpes zoster 12/10/2015  . Neck pain on right side 07/22/2015  . Long term current use of antithrombotics/antiplatelets - clopidogrel and ASA 03/28/2015  . Left shoulder pain 05/31/2014  . Restless leg syndrome 11/14/2013  . Night sweats 09/12/2013  . Hyperlipidemia 12/18/2010  . Pain in the chest 08/11/2010  . VENOUS INSUFFICIENCY 07/28/2010  . 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 ULCERATIVE COLITIS, LEFT SIDED 12/27/2009  . COLONIC POLYPS, ADENOMATOUS,  HX OF 12/27/2009  . BACK PAIN, LUMBAR 10/18/2009  . GERD 04/18/2008  . BENIGN PAROXYSMAL POSITIONAL VERTIGO 09/30/2007  . Essential hypertension 07/07/2007  . CORONARY ARTERY DISEASE 07/07/2007  . PROSTATE CANCER, HX OF 07/07/2007   Past Medical History:  Diagnosis Date  . BACK PAIN, LUMBAR 10/18/2009  . Benign paroxysmal positional vertigo 09/30/2007  . CAD (coronary artery disease)   . Cancer [facility])    prostate   . CHF (congestive heart failure) ([place])   . CKD (chronic kidney disease)   . COLONIC POLYPS, ADENOMATOUS, HX OF   . GERD (gastroesophageal reflux disease)   . Herpes zoster with other nervous system complications(053.19)   . Hyperlipidemia   . Hypertension   . Hyperthyroidism    pt denies  .  Loss of hearing    Bil/has hearing aids in both ears  . Nerve damage of left foot   . PERIPHERAL NEUROPATHY 10/11/2007  . 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 PROSTATE CANCER, HX OF 07/07/2007   s/p prostatectomy  . RLS (restless legs syndrome)   . Ulcer   . ULCERATIVE COLITIS, LEFT SIDED 2000  . URTICARIA, ALLERGIC 08/05/2009  . VENOUS INSUFFICIENCY 07/28/2010    Family History  Problem Relation Age of Onset  . Heart attack Father   . Throat cancer Brother   . Multiple sclerosis Brother   . Multiple sclerosis Son   . Diabetes Maternal Uncle        x 2  . Diabetes Maternal Uncle   . Colon cancer Neg Hx   . Stroke Neg Hx     Past Surgical History:  Procedure Laterality Date  . ANGIOPLASTY  1990's/2004   stent placement 2  . COLONOSCOPY W/ BIOPSIES AND POLYPECTOMY  06/06/2009   adenomatous polyp, diverticulosis, colitis  . INGUINAL HERNIA REPAIR     x x  . ORIF FIBULA FRACTURE Left 03/02/2017   Procedure: OPEN REDUCTION INTERNAL FIXATION (ORIF) LEFT DISTAL FIBULA FRACTURE WITH DELTOID LIGAMENT REPAIR;  Surgeon: [name], MD;  Location: [place];  Service: Orthopedics;  Laterality: Left;  . PROSTATECTOMY    . RETINAL LASER PROCEDURE Right 2016 & 2017   . ROTATOR CUFF REPAIR Left   . TONSILLECTOMY     Social History   Occupational History  . retired Retired  . [employer]    Social History Main Topics  . Smoking status: Former Smoker    Types: Cigarettes    Quit date: 09/08/1975  . Smokeless tobacco: Never Used  . Alcohol use 9.0 oz/week    15 Standard drinks or equivalent per week     Comment: 1-2 "high balls" daily  . Drug use: No  . Sexual activity: Not on file     [name], MD   Note - This record has been created using [employer].  Chart creation errors have been sought, but may not always  have been located. Such creation errors do not reflect on  the standard of medical care.

## 2017-06-24 ENCOUNTER — Ambulatory Visit (INDEPENDENT_AMBULATORY_CARE_PROVIDER_SITE_OTHER): Payer: Federal, State, Local not specified - PPO | Admitting: Adult Health

## 2017-06-24 VITALS — BP 146/82 | Temp 97.8°F | Ht 73.5 in | Wt 219.0 lb

## 2017-06-24 DIAGNOSIS — I10 Essential (primary) hypertension: Secondary | ICD-10-CM | POA: Diagnosis not present

## 2017-06-24 DIAGNOSIS — Z0000 Encounter for general adult medical examination without abnormal findings: Secondary | ICD-10-CM | POA: Diagnosis not present

## 2017-06-24 DIAGNOSIS — K219 Gastro-esophageal reflux disease without esophagitis: Secondary | ICD-10-CM | POA: Diagnosis not present

## 2017-06-24 DIAGNOSIS — I25119 Atherosclerotic heart disease of native coronary artery with unspecified angina pectoris: Secondary | ICD-10-CM | POA: Diagnosis not present

## 2017-06-24 DIAGNOSIS — Z8546 Personal history of malignant neoplasm of prostate: Secondary | ICD-10-CM | POA: Diagnosis not present

## 2017-06-24 DIAGNOSIS — E7800 Pure hypercholesterolemia, unspecified: Secondary | ICD-10-CM | POA: Diagnosis not present

## 2017-06-24 DIAGNOSIS — Z23 Encounter for immunization: Secondary | ICD-10-CM

## 2017-06-24 LAB — HEPATIC FUNCTION PANEL
ALBUMIN: 4.2 g/dL (ref 3.5–5.2)
ALK PHOS: 95 U/L (ref 39–117)
ALT: 19 U/L (ref 0–53)
AST: 18 U/L (ref 0–37)
Bilirubin, Direct: 0.1 mg/dL (ref 0.0–0.3)
TOTAL PROTEIN: 6.3 g/dL (ref 6.0–8.3)
Total Bilirubin: 0.7 mg/dL (ref 0.2–1.2)

## 2017-06-24 LAB — CBC WITH DIFFERENTIAL/PLATELET
BASOS ABS: 0 10*3/uL (ref 0.0–0.1)
BASOS PCT: 0.7 % (ref 0.0–3.0)
EOS PCT: 6.7 % — AB (ref 0.0–5.0)
Eosinophils Absolute: 0.3 10*3/uL (ref 0.0–0.7)
HEMATOCRIT: 37.8 % — AB (ref 39.0–52.0)
Hemoglobin: 12.4 g/dL — ABNORMAL LOW (ref 13.0–17.0)
LYMPHS ABS: 1.8 10*3/uL (ref 0.7–4.0)
LYMPHS PCT: 33.9 % (ref 12.0–46.0)
MCHC: 32.8 g/dL (ref 30.0–36.0)
MCV: 86.1 fl (ref 78.0–100.0)
MONOS PCT: 7.3 % (ref 3.0–12.0)
Monocytes Absolute: 0.4 10*3/uL (ref 0.1–1.0)
NEUTROS ABS: 2.7 10*3/uL (ref 1.4–7.7)
NEUTROS PCT: 51.4 % (ref 43.0–77.0)
PLATELETS: 213 10*3/uL (ref 150.0–400.0)
RBC: 4.39 Mil/uL (ref 4.22–5.81)
RDW: 15.7 % — AB (ref 11.5–15.5)
WBC: 5.2 10*3/uL (ref 4.0–10.5)

## 2017-06-24 LAB — BASIC METABOLIC PANEL
BUN: 20 mg/dL (ref 6–23)
CALCIUM: 9.4 mg/dL (ref 8.4–10.5)
CHLORIDE: 105 meq/L (ref 96–112)
CO2: 26 meq/L (ref 19–32)
Creatinine, Ser: 1.26 mg/dL (ref 0.40–1.50)
GFR: 58.66 mL/min — ABNORMAL LOW (ref >60.00)
GLUCOSE: 117 mg/dL — AB (ref 70–99)
Potassium: 4.4 mEq/L (ref 3.5–5.1)
SODIUM: 139 meq/L (ref 135–145)

## 2017-06-24 LAB — TSH: TSH: 1.68 u[IU]/mL (ref 0.35–4.50)

## 2017-06-24 NOTE — Progress Notes (Signed)
Subjective:    Patient ID: Kristopher Wilson, male    DOB: August 08, 1936, 81 y.o.   MRN: [id number]  HPI  Patient presents for yearly preventative medicine examination. He is a pleasant 81 year old male who  has a past medical history of BACK PAIN, LUMBAR (10/18/2009); Benign paroxysmal positional vertigo (09/30/2007); CAD (coronary artery disease); Cancer [facility]); CHF (congestive heart failure) ([place]); CKD (chronic kidney disease); COLONIC POLYPS, ADENOMATOUS, HX OF; GERD (gastroesophageal reflux disease); Herpes zoster with other nervous system complications(053.19); Hyperlipidemia; Hypertension; Hyperthyroidism; Loss of hearing; Nerve damage of left foot; PERIPHERAL NEUROPATHY (10/11/2007); POSTHERPETIC NEURALGIA (07/03/2010); PROSTATE CANCER, HX OF (07/07/2007); RLS (restless legs syndrome); Ulcer; ULCERATIVE COLITIS, LEFT SIDED (2000); URTICARIA, ALLERGIC (08/05/2009); and VENOUS INSUFFICIENCY (07/28/2010).  He takes ASA 81 mg and Lipitor 40 mg for history of hyperlipidemia   He takes Coreg 12.5 mg BID  for history of hyperlipidemia   He takes ASA, stain, Plavix, and BB for history of CAD for which he is followed by Cardiology   Is Followed by  - Urology- [facility] - Twice a year - Cardiology - NP Servando Snare - Twice a year - GI - Carlean Purl - PRN - [facility] - Once a year - Dermatology  - yearly   All immunizations and health maintenance protocols were reviewed with the patient and needed orders were placed. He is due for a flu shot.   Appropriate screening laboratory values were ordered for the patient including screening of hyperlipidemia, renal function and hepatic function.  Medication reconciliation,  past medical history, social history, problem list and allergies were reviewed in detail with the patient  Goals were established with regard to weight loss, exercise, and  diet in compliance with medications. He is active and eats healthy. He has not been able to exercise as much as he  would like due to injury to left ankle. He has started to be more active   Wt Readings from Last 3 Encounters:  06/24/17 219 lb (99.3 kg)  06/14/17 200 lb (90.7 kg)  04/09/17 219 lb (99.3 kg)    End of life planning was discussed. He has an advanced directive and living will    Interval history includes that of ORIF of the left distal fibula fracture in repair of the deltoid ligament in June. He reports that he was playing golf when he accidentally stepped in a hole, causing an inversion of his left ankle. This was surgically repaired by Dr. Durward Fortes. Today in the office he feels as though he s recovering well and starting to get strength in his left leg back.   Review of Systems  Constitutional: Negative.   HENT: Negative.   Eyes: Negative.   Respiratory: Negative.   Cardiovascular: Negative.   Gastrointestinal: Negative.   Endocrine: Negative.   Genitourinary: Negative.   Musculoskeletal: Positive for arthralgias and joint swelling.  Skin: Positive for rash.  Allergic/Immunologic: Negative.   Neurological: Negative.   Hematological: Negative.   Psychiatric/Behavioral: Negative.   All other systems reviewed and are negative.  Past Medical History:  Diagnosis Date  . BACK PAIN, LUMBAR 10/18/2009  . Benign paroxysmal positional vertigo 09/30/2007  . CAD (coronary artery disease)   . Cancer [facility])    prostate   . CHF (congestive heart failure) ([place])   . CKD (chronic kidney disease)   . COLONIC POLYPS, ADENOMATOUS, HX OF   . GERD (gastroesophageal reflux disease)   . Herpes zoster with other nervous system complications(053.19)   . Hyperlipidemia   .  Hypertension   . Hyperthyroidism    pt denies  . Loss of hearing    Bil/has hearing aids in both ears  . Nerve damage of left foot   . PERIPHERAL NEUROPATHY 10/11/2007  . 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 PROSTATE CANCER, HX OF 07/07/2007   s/p prostatectomy  . RLS (restless legs syndrome)   . Ulcer   . ULCERATIVE  COLITIS, LEFT SIDED 2000  . URTICARIA, ALLERGIC 08/05/2009  . VENOUS INSUFFICIENCY 07/28/2010    Social History   Social History  . Marital status: Married    Spouse name: N/A  . Number of children: N/A  . Years of education: N/A   Occupational History  . retired Retired  . [employer]    Social History Main Topics  . Smoking status: Former Smoker    Types: Cigarettes    Quit date: 09/08/1975  . Smokeless tobacco: Never Used  . Alcohol use 9.0 oz/week    15 Standard drinks or equivalent per week     Comment: 1-2 "high balls" daily  . Drug use: No  . Sexual activity: Not on file   Other Topics Concern  . 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 All live in /[place] area   Works part time at [employer]    Past Surgical History:  Procedure Laterality Date  . ANGIOPLASTY  1990's/2004   stent placement 2  . COLONOSCOPY W/ BIOPSIES AND POLYPECTOMY  06/06/2009   adenomatous polyp, diverticulosis, colitis  . INGUINAL HERNIA REPAIR     x x  . ORIF FIBULA FRACTURE Left 03/02/2017   Procedure: OPEN REDUCTION INTERNAL FIXATION (ORIF) LEFT DISTAL FIBULA FRACTURE WITH DELTOID LIGAMENT REPAIR;  Surgeon: [name], MD;  Location: [place];  Service: Orthopedics;  Laterality: Left;  . PROSTATECTOMY    . RETINAL LASER PROCEDURE Right 2016 & 2017   . ROTATOR CUFF REPAIR Left   . TONSILLECTOMY      Family History  Problem Relation Age of Onset  . Heart attack Father   . Throat cancer Brother   . Multiple sclerosis Brother   . Multiple sclerosis Son   . Diabetes Maternal Uncle        x 2  . Diabetes Maternal Uncle   . Colon cancer Neg Hx   . Stroke Neg Hx     Allergies  Allergen Reactions  . Cephalexin Hives    Current Outpatient Prescriptions on File Prior to Visit  Medication Sig Dispense Refill  . aspirin 81 MG tablet Take 81 mg by mouth daily.      Marland Kitchen  atorvastatin (LIPITOR) 40 MG tablet TAKE 1 TABLET DAILY 90 tablet 0  . carvedilol (COREG) 12.5 MG tablet TAKE 1 TABLET TWICE DAILY  WITH MEALS 180 tablet 0  . clopidogrel (PLAVIX) 75 MG tablet Take 1 tablet (75 mg total) by mouth daily. 90 tablet 3  . isosorbide mononitrate (IMDUR) 30 MG 24 hr tablet Take 30 mg by mouth daily.  0  . Mesalamine 800 MG TBEC TAKE 2 TABLETS (=1,600MG ) 3TIMES A DAY 540 tablet 0  . nitroGLYCERIN (NITROSTAT) 0.4 MG SL tablet Place 0.4 mg under the tongue every 5 (five) minutes as needed for chest pain.    . pantoprazole (PROTONIX) 40 MG tablet Take 1 tablet (40 mg total) by mouth daily. [place]  tablet 0  . pramipexole (MIRAPEX) 1 MG tablet Take 1 tablet (1 mg total) by mouth at bedtime. 90 tablet 3  . meclizine (ANTIVERT) 12.5 MG tablet TAKE 1 TABLET (12.5 MG TOTAL) BY MOUTH 2 (TWO) TIMES DAILY AS NEEDED FOR DIZZINESS. (Patient not taking: Reported on 06/24/2017) 30 tablet 0   No current facility-administered medications on file prior to visit.     BP (!) 146/82 (BP Location: Left Arm)   Temp 97.8 F (36.6 C) (Oral)   Ht 6' 1.5" (1.867 m)   Wt 219 lb (99.3 kg)   BMI 28.50 kg/m       Objective:   Physical Exam  Constitutional: He is oriented to person, place, and time. He appears well-developed and well-nourished. No distress.  HENT:  Head: Normocephalic and atraumatic.  Right Ear: External ear normal.  Left Ear: External ear normal.  Nose: Nose normal.  Mouth/Throat: Oropharynx is clear and moist. No oropharyngeal exudate.  Eyes: Pupils are equal, round, and reactive to light. Conjunctivae and EOM are normal. Right eye exhibits no discharge. Left eye exhibits no discharge. No scleral icterus.  Neck: Normal range of motion. Neck supple. No JVD present. No tracheal deviation present. No thyromegaly present.  Cardiovascular: Normal rate, regular rhythm, normal heart sounds and intact distal pulses.  Exam reveals no gallop and no friction rub.   No murmur  heard. Pulmonary/Chest: Effort normal and breath sounds normal. No stridor. No respiratory distress. He has no wheezes. He has no rales. He exhibits no tenderness.  Abdominal: Soft. Bowel sounds are normal. He exhibits no distension and no mass. There is no tenderness. There is no rebound and no guarding.  Genitourinary:  Genitourinary Comments: Has had a prostectomy   Musculoskeletal: Normal range of motion. He exhibits edema (left ankle ). He exhibits no tenderness or deformity.  Lymphadenopathy:    He has no cervical adenopathy.  Neurological: He is alert and oriented to person, place, and time. He has normal reflexes. He displays normal reflexes. No cranial nerve deficit. He exhibits normal muscle tone. Coordination normal.  Skin: Skin is warm and dry. No rash noted. He is not diaphoretic. No erythema. No pallor.  Surgical scar on lateral aspect of left ankle.   Psychiatric: He has a normal mood and affect. His behavior is normal. Judgment and thought content normal.  Nursing note and vitals reviewed.     Assessment & Plan:  1. Routine general medical examination at a health care facility -  - Basic metabolic panel - CBC with Differential/Platelet - Hepatic function panel - TSH  2. Essential hypertension - Near goal.  - No change in medication at this time  - Basic metabolic panel - CBC with Differential/Platelet - Hepatic function panel - TSH  3. Pure hypercholesterolemia - Continue with statin. Lipid drawn by Cardiology 3 months ago.  - Basic metabolic panel - CBC with Differential/Platelet - Hepatic function panel - TSH  4. Gastroesophageal reflux disease, esophagitis presence not specified - Controlled with Protonix   5. Atherosclerosis of native coronary artery of native heart with angina pectoris ([place]) - Continue with ASA, Plavix, BB and statin  - Basic metabolic panel - CBC with Differential/Platelet - Hepatic function panel - TSH  6. PROSTATE CANCER, HX  OF - Follow up with Urology next month   7. Need for influenza vaccination  - Flu vaccine HIGH DOSE PF (Fluzone High dose)  Dorothyann Peng, NP

## 2017-06-24 NOTE — Patient Instructions (Signed)
It was great seeing you today   I will follow up with you regarding your blood work   Try and stay out of trouble!   Health Maintenance, Male A healthy lifestyle and preventative care can promote health and wellness.  Maintain regular health, dental, and eye exams.  Eat a healthy diet. Foods like vegetables, fruits, whole grains, low-fat dairy products, and lean protein foods contain the nutrients you need and are low in calories. Decrease your intake of foods high in solid fats, added sugars, and salt. Get information about a proper diet from your health care provider, if necessary.  Regular physical exercise is one of the most important things you can do for your health. Most adults should get at least 150 minutes of moderate-intensity exercise (any activity that increases your heart rate and causes you to sweat) each week. In addition, most adults need muscle-strengthening exercises on 2 or more days a week.   Maintain a healthy weight. The body mass index (BMI) is a screening tool to identify possible weight problems. It provides an estimate of body fat based on height and weight. Your health care provider can find your BMI and can help you achieve or maintain a healthy weight. For males 20 years and older:  A BMI below 18.5 is considered underweight.  A BMI of 18.5 to 24.9 is normal.  A BMI of 25 to 29.9 is considered overweight.  A BMI of 30 and above is considered obese.  Maintain normal blood lipids and cholesterol by exercising and minimizing your intake of saturated fat. Eat a balanced diet with plenty of fruits and vegetables. Blood tests for lipids and cholesterol should begin at age 32 and be repeated every 5 years. If your lipid or cholesterol levels are high, you are over age 31, or you are at high risk for heart disease, you may need your cholesterol levels checked more frequently.Ongoing high lipid and cholesterol levels should be treated with medicines if diet and  exercise are not working.  If you smoke, find out from your health care provider how to quit. If you do not use tobacco, do not start.  Lung cancer screening is recommended for adults aged 45-80 years who are at high risk for developing lung cancer because of a history of smoking. A yearly low-dose CT scan of the lungs is recommended for people who have at least a 30-pack-year history of smoking and are current smokers or have quit within the past 15 years. A pack year of smoking is smoking an average of 1 pack of cigarettes a day for 1 year (for example, a 30-pack-year history of smoking could mean smoking 1 pack a day for 30 years or 2 packs a day for 15 years). Yearly screening should continue until the smoker has stopped smoking for at least 15 years. Yearly screening should be stopped for people who develop a health problem that would prevent them from having lung cancer treatment.  If you choose to drink alcohol, do not have more than 2 drinks per day. One drink is considered to be 12 oz (360 mL) of beer, 5 oz (150 mL) of wine, or 1.5 oz (45 mL) of liquor.  Avoid the use of street drugs. Do not share needles with anyone. Ask for help if you need support or instructions about stopping the use of drugs.  High blood pressure causes heart disease and increases the risk of stroke. High blood pressure is more likely to develop in:  People  who have blood pressure in the end of the normal range (100-139/85-89 mm Hg).  People who are overweight or obese.  People who are African American.  If you are 23-27 years of age, have your blood pressure checked every 3-5 years. If you are 82 years of age or older, have your blood pressure checked every year. You should have your blood pressure measured twice--once when you are at a hospital or clinic, and once when you are not at a hospital or clinic. Record the average of the two measurements. To check your blood pressure when you are not at a hospital or  clinic, you can use:  An automated blood pressure machine at a pharmacy.  A home blood pressure monitor.  If you are 70-45 years old, ask your health care provider if you should take aspirin to prevent heart disease.  Diabetes screening involves taking a blood sample to check your fasting blood sugar level. This should be done once every 3 years after age 84 if you are at a normal weight and without risk factors for diabetes. Testing should be considered at a younger age or be carried out more frequently if you are overweight and have at least 1 risk factor for diabetes.  Colorectal cancer can be detected and often prevented. Most routine colorectal cancer screening begins at the age of 71 and continues through age 11. However, your health care provider may recommend screening at an earlier age if you have risk factors for colon cancer. On a yearly basis, your health care provider may provide home test kits to check for hidden blood in the stool. A small camera at the end of a tube may be used to directly examine the colon (sigmoidoscopy or colonoscopy) to detect the earliest forms of colorectal cancer. Talk to your health care provider about this at age 24 when routine screening begins. A direct exam of the colon should be repeated every 5-10 years through age 56, unless early forms of precancerous polyps or small growths are found.  People who are at an increased risk for hepatitis B should be screened for this virus. You are considered at high risk for hepatitis B if:  You were born in a country where hepatitis B occurs often. Talk with your health care provider about which countries are considered high risk.  Your parents were born in a high-risk country and you have not received a shot to protect against hepatitis B (hepatitis B vaccine).  You have HIV or AIDS.  You use needles to inject street drugs.  You live with, or have sex with, someone who has hepatitis B.  You are a man who has  sex with other men (MSM).  You get hemodialysis treatment.  You take certain medicines for conditions like cancer, organ transplantation, and autoimmune conditions.  Hepatitis C blood testing is recommended for all people born from [birth year] through 1963 and any individual with known risk factors for hepatitis C.  Healthy men should no longer receive prostate-specific antigen (PSA) blood tests as part of routine cancer screening. Talk to your health care provider about prostate cancer screening.  Testicular cancer screening is not recommended for adolescents or adult males who have no symptoms. Screening includes self-exam, a health care provider exam, and other screening tests. Consult with your health care provider about any symptoms you have or any concerns you have about testicular cancer.  Practice safe sex. Use condoms and avoid high-risk sexual practices to reduce the spread of sexually  transmitted infections (STIs).  You should be screened for STIs, including gonorrhea and chlamydia if:  You are sexually active and are younger than 24 years.  You are older than 24 years, and your health care provider tells you that you are at risk for this type of infection.  Your sexual activity has changed since you were last screened, and you are at an increased risk for chlamydia or gonorrhea. Ask your health care provider if you are at risk.  If you are at risk of being infected with HIV, it is recommended that you take a prescription medicine daily to prevent HIV infection. This is called pre-exposure prophylaxis (PrEP). You are considered at risk if:  You are a man who has sex with other men (MSM).  You are a heterosexual man who is sexually active with multiple partners.  You take drugs by injection.  You are sexually active with a partner who has HIV.  Talk with your health care provider about whether you are at high risk of being infected with HIV. If you choose to begin PrEP, you should  first be tested for HIV. You should then be tested every 3 months for as long as you are taking PrEP.  Use sunscreen. Apply sunscreen liberally and repeatedly throughout the day. You should seek shade when your shadow is shorter than you. Protect yourself by wearing long sleeves, pants, a wide-brimmed hat, and sunglasses year round whenever you are outdoors.  Tell your health care provider of new moles or changes in moles, especially if there is a change in shape or color. Also, tell your health care provider if a mole is larger than the size of a pencil eraser.  A one-time screening for abdominal aortic aneurysm (AAA) and surgical repair of large AAAs by ultrasound is recommended for men aged 35-75 years who are current or former smokers.  Stay current with your vaccines (immunizations).   This information is not intended to replace advice given to you by your health care provider. Make sure you discuss any questions you have with your health care provider.   Document Released: 02/20/2008 Document Revised: 09/14/2014 Document Reviewed: 01/19/2011 Elsevier Interactive Patient Education [employer].

## 2017-07-28 ENCOUNTER — Ambulatory Visit (INDEPENDENT_AMBULATORY_CARE_PROVIDER_SITE_OTHER): Payer: Federal, State, Local not specified - PPO | Admitting: Adult Health

## 2017-07-28 ENCOUNTER — Encounter: Payer: Self-pay | Admitting: Adult Health

## 2017-07-28 VITALS — BP 136/70 | Temp 98.1°F | Wt 226.0 lb

## 2017-07-28 DIAGNOSIS — J069 Acute upper respiratory infection, unspecified: Secondary | ICD-10-CM | POA: Diagnosis not present

## 2017-07-28 DIAGNOSIS — I25119 Atherosclerotic heart disease of native coronary artery with unspecified angina pectoris: Secondary | ICD-10-CM

## 2017-07-28 MED ORDER — DOXYCYCLINE HYCLATE 100 MG PO CAPS: 100.0000 mg | ORAL_CAPSULE | Freq: Two times a day (BID) | ORAL | 0 refills | Status: DC

## 2017-07-28 MED ORDER — IPRATROPIUM-ALBUTEROL 0.5-2.5 (3) MG/3ML IN SOLN: 3.0000 mL | Freq: Once | RESPIRATORY_TRACT | Status: DC

## 2017-07-28 MED ORDER — PREDNISONE 10 MG PO TABS: ORAL_TABLET | ORAL | 0 refills | Status: DC

## 2017-07-28 NOTE — Progress Notes (Signed)
Subjective:    Patient ID: Kristopher Wilson, male    DOB: Jun 06, 1936, 81 y.o.   MRN: [id number]  Symptoms started towards the end of a cruise around [place] and [place]; have been becoming progressively worse    Wheezing   This is a new problem. The current episode started 1 to 4 weeks ago. Associated symptoms include coughing, a fever, rhinorrhea, shortness of breath and sputum production. Pertinent negatives include no sore throat.      Review of Systems  Constitutional: Positive for activity change, fatigue and fever.  HENT: Positive for postnasal drip, rhinorrhea, sinus pressure and sinus pain. Negative for sore throat.   Respiratory: Positive for cough, sputum production, chest tightness, shortness of breath and wheezing.   Cardiovascular: Negative.    Past Medical History:  Diagnosis Date  . BACK PAIN, LUMBAR 10/18/2009  . Benign paroxysmal positional vertigo 09/30/2007  . CAD (coronary artery disease)   . Cancer [facility])    prostate   . CHF (congestive heart failure) ([place])   . CKD (chronic kidney disease)   . COLONIC POLYPS, ADENOMATOUS, HX OF   . GERD (gastroesophageal reflux disease)   . Herpes zoster with other nervous system complications(053.19)   . Hyperlipidemia   . Hypertension   . Hyperthyroidism    pt denies  . Loss of hearing    Bil/has hearing aids in both ears  . Nerve damage of left foot   . PERIPHERAL NEUROPATHY 10/11/2007  . 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 PROSTATE CANCER, HX OF 07/07/2007   s/p prostatectomy  . RLS (restless legs syndrome)   . Ulcer   . ULCERATIVE COLITIS, LEFT SIDED 2000  . URTICARIA, ALLERGIC 08/05/2009  . VENOUS INSUFFICIENCY 07/28/2010    Social History   Socioeconomic History  . Marital status: Married    Spouse name: Not on file  . Number of children: Not on file  . Years of education: Not on file  . Highest education level: Not on file  Social Needs  . Financial resource strain: Not on file  . Food  insecurity - worry: Not on file  . Food insecurity - inability: Not on file  . Transportation needs - medical: Not on file  . Transportation needs - non-medical: Not on file  Occupational History  . Occupation: retired    Fish farm manager: RETIRED  . Occupation: [employer]  Tobacco Use  . Smoking status: Former Smoker    Types: Cigarettes    Last attempt to quit: 09/08/1975    Years since quitting: 41.9  . Smokeless tobacco: Never Used  Substance and Sexual Activity  . Alcohol use: Yes    Alcohol/week: 9.0 oz    Types: 15 Standard drinks or equivalent per week    Comment: 1-2 "high balls" daily  . Drug use: No  . Sexual activity: Not on file  Other Topics Concern  . 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 All live in [place] area   Works part time at [employer]    Past Surgical History:  Procedure Laterality Date  . ANGIOPLASTY  1990's/2004   stent placement 2  . COLONOSCOPY W/ BIOPSIES AND POLYPECTOMY  06/06/2009   adenomatous polyp, diverticulosis, colitis  . INGUINAL HERNIA REPAIR     x x  . ORIF FIBULA FRACTURE Left 03/02/2017   Procedure:  OPEN REDUCTION INTERNAL FIXATION (ORIF) LEFT DISTAL FIBULA FRACTURE WITH DELTOID LIGAMENT REPAIR;  Surgeon: [name], MD;  Location: [place];  Service: Orthopedics;  Laterality: Left;  . PROSTATECTOMY    . RETINAL LASER PROCEDURE Right 2016 & 2017   . ROTATOR CUFF REPAIR Left   . TONSILLECTOMY      Family History  Problem Relation Age of Onset  . Heart attack Father   . Throat cancer Brother   . Multiple sclerosis Brother   . Multiple sclerosis Son   . Diabetes Maternal Uncle        x 2  . Diabetes Maternal Uncle   . Colon cancer Neg Hx   . Stroke Neg Hx     Allergies  Allergen Reactions  . Cephalexin Hives    Current Outpatient Medications on File Prior to Visit  Medication Sig Dispense Refill  . aspirin 81  MG tablet Take 81 mg by mouth daily.      Marland Kitchen atorvastatin (LIPITOR) 40 MG tablet TAKE 1 TABLET DAILY 90 tablet 0  . carvedilol (COREG) 12.5 MG tablet TAKE 1 TABLET TWICE DAILY  WITH MEALS 180 tablet 0  . clopidogrel (PLAVIX) 75 MG tablet Take 1 tablet (75 mg total) by mouth daily. 90 tablet 3  . isosorbide mononitrate (IMDUR) 30 MG 24 hr tablet Take 30 mg by mouth daily.  0  . meclizine (ANTIVERT) 12.5 MG tablet TAKE 1 TABLET (12.5 MG TOTAL) BY MOUTH 2 (TWO) TIMES DAILY AS NEEDED FOR DIZZINESS. 30 tablet 0  . Mesalamine 800 MG TBEC TAKE 2 TABLETS (=1,600MG ) 3TIMES A DAY 540 tablet 0  . nitroGLYCERIN (NITROSTAT) 0.4 MG SL tablet Place 0.4 mg under the tongue every 5 (five) minutes as needed for chest pain.    . pantoprazole (PROTONIX) 40 MG tablet Take 1 tablet (40 mg total) by mouth daily. 30 tablet 0  . pramipexole (MIRAPEX) 1 MG tablet Take 1 tablet (1 mg total) by mouth at bedtime. 90 tablet 3   No current facility-administered medications on file prior to visit.     BP 136/70 (BP Location: Left Arm)   Temp 98.1 F (36.7 C) (Oral)   Wt 226 lb (102.5 kg)   BMI 29.41 kg/m       Objective:   Physical Exam  Constitutional: He appears well-developed and well-nourished. He appears ill. No distress.  HENT:  Head: Atraumatic. Not microcephalic.  Right Ear: Tympanic membrane and external ear normal.  Left Ear: Tympanic membrane and external ear normal.  Nose: Mucosal edema and rhinorrhea present. Right sinus exhibits no maxillary sinus tenderness and no frontal sinus tenderness. Left sinus exhibits no maxillary sinus tenderness and no frontal sinus tenderness.  Mouth/Throat: Uvula is midline, oropharynx is clear and moist and mucous membranes are normal. No oropharyngeal exudate.  Cardiovascular: Normal rate, regular rhythm, normal heart sounds and intact distal pulses. Exam reveals no gallop.  No murmur heard. Pulmonary/Chest: No respiratory distress. He has wheezes (throughout lung  fields ). He has no rales. He exhibits no tenderness.  Tight sounding breath sounds throughout    Nursing note and vitals reviewed.     Assessment & Plan:  1. Upper respiratory tract infection, unspecified type - ipratropium-albuterol (DUONEB) 0.5-2.5 (3) MG/3ML nebulizer solution 3 mL - doxycycline (VIBRAMYCIN) 100 MG capsule; Take 1 capsule (100 mg total) by mouth 2 (two) times daily.  Dispense: 14 capsule; Refill: 0 - predniSONE (DELTASONE) 10 MG tablet; 40 mg x 3 days, 20 mg x 3  days, 10 mg x 3 days  Dispense: 21 tablet; Refill: 0  - patient endorsed improvement in his breathing after duo neb. On exam, wheezing had resolved   Dorothyann Peng, NP

## 2017-08-14 ENCOUNTER — Other Ambulatory Visit: Payer: Self-pay | Admitting: Adult Health

## 2017-08-14 DIAGNOSIS — Z760 Encounter for issue of repeat prescription: Secondary | ICD-10-CM

## 2017-08-14 DIAGNOSIS — E785 Hyperlipidemia, unspecified: Secondary | ICD-10-CM

## 2017-09-09 ENCOUNTER — Encounter (INDEPENDENT_AMBULATORY_CARE_PROVIDER_SITE_OTHER): Payer: Self-pay | Admitting: Orthopaedic Surgery

## 2017-09-09 ENCOUNTER — Ambulatory Visit (INDEPENDENT_AMBULATORY_CARE_PROVIDER_SITE_OTHER): Payer: Federal, State, Local not specified - PPO | Admitting: Orthopaedic Surgery

## 2017-09-09 ENCOUNTER — Ambulatory Visit (INDEPENDENT_AMBULATORY_CARE_PROVIDER_SITE_OTHER): Payer: Self-pay

## 2017-09-09 VITALS — BP 154/85 | HR 78 | Resp 14 | Ht 74.0 in | Wt 220.0 lb

## 2017-09-09 DIAGNOSIS — M25572 Pain in left ankle and joints of left foot: Secondary | ICD-10-CM

## 2017-09-09 NOTE — Progress Notes (Signed)
Office Visit Note   Patient: Kristopher Wilson           Date of Birth: 1936/06/25           MRN: [id number] Visit Date: 09/09/2017              Requested by: Kristopher Peng, NP [address], [place] [address] PCP: Kristopher Peng, NP   Assessment & Plan: Visit Diagnoses:  1. Pain in left ankle and joints of left foot     Plan: Nondescript pain and swelling left lower extremity for approximately 6 weeks. No history of injury or trauma. Films of the left ankle reveal excellent position of his fracture internal fixation without complication. There is some posterior calf pain might be worthwhile to consider a Doppler study. He also is a history of neuropathy with a partial foot drop and history of swelling. The swelling may be related to that but we'll obtain a Doppler just to be sure there is no DVT  Follow-Up Instructions: Return in about 2 weeks (around 09/23/2017).   Orders:  Orders Placed This Encounter  Procedures  . XR Ankle Complete Left   No orders of the defined types were placed in this encounter.     Procedures: No procedures performed   Clinical Data: No additional findings.   Subjective: Chief Complaint  Patient presents with  . Left Ankle - Pain, Edema, Numbness    Kristopher Wilson is a 82  y o S/P 6 months (03/02/17). Today he has pain, ankle/leg swelling for 6-8 weeks. Pt uses compression hose.   insidious onset  Of swelling left lower extremity from the top of his sock to the dorsum of his foot. No fever or chills. No shortness of breath or chest pain. 6 months status post open reduction internal fixation of distal fibula fracture and repair of the deltoid ligament. No recurrent trauma or injury. No prior history of DVT  HPI  Review of Systems  Constitutional: Negative for fatigue.  HENT: Negative for hearing loss.   Respiratory: Negative for apnea, chest tightness and shortness of breath.   Cardiovascular: Negative for chest pain, palpitations  and leg swelling.  Gastrointestinal: Negative for blood in stool, constipation and diarrhea.  Genitourinary: Negative for difficulty urinating.  Musculoskeletal: Negative for arthralgias, back pain, joint swelling, myalgias, neck pain and neck stiffness.  Neurological: Negative for weakness, numbness and headaches.  Hematological: Does not bruise/bleed easily.  Psychiatric/Behavioral: Negative for sleep disturbance. The patient is not nervous/anxious.      Objective: Vital Signs: BP (!) 154/85   Pulse 78   Resp 14   Ht 6' 2"  (1.88 m)   Wt 220 lb (99.8 kg)   BMI 28.25 kg/m   Physical Exam  Ortho Exam awake alert and oriented 3. Comfortable sitting. Is wearing socks with elastic band below which. He is swelling of his distal leg and foot. The ankle was not particularly warm. Does have some weakness. His history of peripheral neuropathy but no change. Has some altered sensibility which also has no change. Good capillary refill to toes. Redness. No open areas. No wound drainage. Does have some mid calf pain with minimally positive home  Specialty Comments:  No specialty comments available.  Imaging: Xr Ankle Complete Left  Result Date: 09/09/2017 Films of the left ankle obtained in 3 projections. The previously fixed the fibula fracture is in good position with with excellent fixation of the plate and screws. Fracture line is not visible. Ankle  mortise intact. Some ectopic calcification along the medial malleolus consistent with repair of the deltoid ligament. The trimalleolar component also is in anatomic position posteriorly. No obvious complications from ORIF 6 months ago    PMFS History: Patient Active Problem List   Diagnosis Date Noted  . Displaced fracture of distal end of left fibula 03/02/2017  . Tear of deltoid ligament of ankle 03/02/2017  . Routine general medical examination at a health care facility 02/06/2016  . Lumbosacral radiculopathy due to herpes zoster  12/10/2015  . Neck pain on right side 07/22/2015  . Long term current use of antithrombotics/antiplatelets - clopidogrel and ASA 03/28/2015  . Left shoulder pain 05/31/2014  . Restless leg syndrome 11/14/2013  . Night sweats 09/12/2013  . Hyperlipidemia 12/18/2010  . Pain in the chest 08/11/2010  . VENOUS INSUFFICIENCY 07/28/2010  . 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 ULCERATIVE COLITIS, LEFT SIDED 12/27/2009  . COLONIC POLYPS, ADENOMATOUS, HX OF 12/27/2009  . BACK PAIN, LUMBAR 10/18/2009  . GERD 04/18/2008  . BENIGN PAROXYSMAL POSITIONAL VERTIGO 09/30/2007  . Essential hypertension 07/07/2007  . Coronary atherosclerosis 07/07/2007  . PROSTATE CANCER, HX OF 07/07/2007   Past Medical History:  Diagnosis Date  . BACK PAIN, LUMBAR 10/18/2009  . Benign paroxysmal positional vertigo 09/30/2007  . CAD (coronary artery disease)   . Cancer [facility])    prostate   . CHF (congestive heart failure) ([place])   . CKD (chronic kidney disease)   . COLONIC POLYPS, ADENOMATOUS, HX OF   . GERD (gastroesophageal reflux disease)   . Herpes zoster with other nervous system complications(053.19)   . Hyperlipidemia   . Hypertension   . Hyperthyroidism    pt denies  . Loss of hearing    Bil/has hearing aids in both ears  . Nerve damage of left foot   . PERIPHERAL NEUROPATHY 10/11/2007  . 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 PROSTATE CANCER, HX OF 07/07/2007   s/p prostatectomy  . RLS (restless legs syndrome)   . Ulcer   . ULCERATIVE COLITIS, LEFT SIDED 2000  . URTICARIA, ALLERGIC 08/05/2009  . VENOUS INSUFFICIENCY 07/28/2010    Family History  Problem Relation Age of Onset  . Heart attack Father   . Throat cancer Brother   . Multiple sclerosis Brother   . Multiple sclerosis Son   . Diabetes Maternal Uncle        x 2  . Diabetes Maternal Uncle   . Colon cancer Neg Hx   . Stroke Neg Hx     Past Surgical History:  Procedure Laterality Date  . ANGIOPLASTY  1990's/2004   stent  placement 2  . COLONOSCOPY W/ BIOPSIES AND POLYPECTOMY  06/06/2009   adenomatous polyp, diverticulosis, colitis  . INGUINAL HERNIA REPAIR     x x  . ORIF FIBULA FRACTURE Left 03/02/2017   Procedure: OPEN REDUCTION INTERNAL FIXATION (ORIF) LEFT DISTAL FIBULA FRACTURE WITH DELTOID LIGAMENT REPAIR;  Surgeon: [name], MD;  Location: [place];  Service: Orthopedics;  Laterality: Left;  . PROSTATECTOMY    . RETINAL LASER PROCEDURE Right 2016 & 2017   . ROTATOR CUFF REPAIR Left   . TONSILLECTOMY     Social History   Occupational History  . Occupation: retired    Fish farm manager: RETIRED  . Occupation: [employer]  Tobacco Use  . Smoking status: Former Smoker    Types: Cigarettes    Last attempt to quit: 09/08/1975    Years since quitting: 42.0  .  Smokeless tobacco: Never Used  Substance and Sexual Activity  . Alcohol use: Yes    Alcohol/week: 9.0 oz    Types: 15 Standard drinks or equivalent per week    Comment: 1-2 "high balls" daily  . Drug use: No  . Sexual activity: Not on file

## 2017-09-10 ENCOUNTER — Ambulatory Visit (HOSPITAL_COMMUNITY)
Admission: RE | Admit: 2017-09-10 | Discharge: 2017-09-10 | Disposition: A | Discharge status: Home / Self Care | Payer: Federal, State, Local not specified - PPO | Source: Ambulatory Visit | Attending: Orthopaedic Surgery | Admitting: Orthopaedic Surgery

## 2017-09-10 DIAGNOSIS — R9389 Abnormal findings on diagnostic imaging of other specified body structures: Secondary | ICD-10-CM | POA: Diagnosis not present

## 2017-09-10 DIAGNOSIS — M25572 Pain in left ankle and joints of left foot: Secondary | ICD-10-CM | POA: Insufficient documentation

## 2017-09-10 NOTE — Progress Notes (Signed)
*  Preliminary Results* Left lower extremity venous duplex completed. Left lower extremity is negative for deep vein thrombosis. There is no evidence of left Baker's cyst.  09/10/2017 9:30 AM  Maudry Mayhew, BS, RVT, RDCS, RDMS

## 2017-09-21 ENCOUNTER — Encounter: Payer: Self-pay | Admitting: Nurse Practitioner

## 2017-10-06 ENCOUNTER — Encounter: Payer: Self-pay | Admitting: Nurse Practitioner

## 2017-10-06 ENCOUNTER — Ambulatory Visit: Payer: Federal, State, Local not specified - PPO | Admitting: Nurse Practitioner

## 2017-10-06 VITALS — BP 138/100 | HR 64 | Ht 75.0 in | Wt 225.8 lb

## 2017-10-06 DIAGNOSIS — I10 Essential (primary) hypertension: Secondary | ICD-10-CM

## 2017-10-06 DIAGNOSIS — E7800 Pure hypercholesterolemia, unspecified: Secondary | ICD-10-CM | POA: Diagnosis not present

## 2017-10-06 DIAGNOSIS — I2510 Atherosclerotic heart disease of native coronary artery without angina pectoris: Secondary | ICD-10-CM

## 2017-10-06 MED ORDER — HYDROCHLOROTHIAZIDE 25 MG PO TABS
25.0000 mg | ORAL_TABLET | Freq: Every day | ORAL | 3 refills | Status: DC
Start: 2017-10-06 — End: 2018-09-22

## 2017-10-06 NOTE — Progress Notes (Signed)
CARDIOLOGY OFFICE NOTE  Date:  10/06/2017    Kristopher Wilson Date of Birth: 1936/03/16 Medical Record #[id number]  PCP:  Kristopher Peng, NP  Cardiologist:  Kristopher Wilson (Former Kristopher Wilson)    Chief Complaint  Patient presents with  . Coronary Artery Disease    Follow up visit    History of Present Illness: Kristopher Wilson is a 82 y.o. male who presents today for a 6 month check. Seen for Dr. Aundra Wilson.   He has a history of CAD, ulcerative colitis, and shingles with post-herpetic neuralgia. Patient had PCI in 2000 with BMS to LAD and repeat PCI in 2009 with DES to PLV. ETT-myoview in 3/16 showed no evidence for ischemia or infarction. His activity has been chronically limited by a left leg neuropathy that has been thought to be due to post-herpetic neuralgia. He has been seen by neurology for this. He wears an orthotic on the left leg because of foot drop.He remains on aspirin and Plavix.   I saw him back in March of 2016 - he was having atypical chest pain. BP was up. We updated his Myoview - this turned out ok. He was going to monitor his BP at home. When seen back for follow up- he was doing well. BP improved. Really likes his salt. His BP cuff was about 10 points higher than here in the office.   I saw him back in October of 2016 - was having chest pain - difficult to sort out. Still getting too much salt. Since he had had a recent Myoview - I placed him on Imdur and we elected to follow medically. He has wanted to try and be managed medically.   I saw him back in June of 2018 - was having more chest pain - he wanted medical management - we increased his Imdur. Then he broke his ankle (probably due to alcohol involvement) and needed a pre op clearance - I put him at increased risk. This was done on aspirin. He did well with his surgery. Last seen back in July - was doing ok from our standpoint.   Comes back today. Here alone. Still limited by his leg pain. Still loving salt. No  chest pain. Not short of breath. Planning on going to [place] after this visit. He is going to the gym at least 3 times a week - riding the bike. Walking is difficult for him. Has some swelling with the ankle from prior fracture. Not really short of breath. No chest pain at all. He has just returned from a cruise to [place] - free alcohol - he admits his diet has not been that great. Overall, he feels like he is doing ok and has no real concern.   Past Medical History:  Diagnosis Date  . BACK PAIN, LUMBAR 10/18/2009  . Benign paroxysmal positional vertigo 09/30/2007  . CAD (coronary artery disease)   . Cancer [facility])    prostate   . CHF (congestive heart failure) ([place])   . CKD (chronic kidney disease)   . COLONIC POLYPS, ADENOMATOUS, HX OF   . GERD (gastroesophageal reflux disease)   . Herpes zoster with other nervous system complications(053.19)   . Hyperlipidemia   . Hypertension   . Hyperthyroidism    pt denies  . Loss of hearing    Bil/has hearing aids in both ears  . Nerve damage of left foot   . PERIPHERAL NEUROPATHY 10/11/2007  . 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 PROSTATE CANCER, HX  OF 07/07/2007   s/p prostatectomy  . RLS (restless legs syndrome)   . Ulcer   . ULCERATIVE COLITIS, LEFT SIDED 2000  . URTICARIA, ALLERGIC 08/05/2009  . VENOUS INSUFFICIENCY 07/28/2010    Past Surgical History:  Procedure Laterality Date  . ANGIOPLASTY  1990's/2004   stent placement 2  . COLONOSCOPY W/ BIOPSIES AND POLYPECTOMY  06/06/2009   adenomatous polyp, diverticulosis, colitis  . INGUINAL HERNIA REPAIR     x x  . ORIF FIBULA FRACTURE Left 03/02/2017   Procedure: OPEN REDUCTION INTERNAL FIXATION (ORIF) LEFT DISTAL FIBULA FRACTURE WITH DELTOID LIGAMENT REPAIR;  Surgeon: Kristopher Balding, MD;  Location: [place];  Service: Orthopedics;  Laterality: Left;  . PROSTATECTOMY    . RETINAL LASER PROCEDURE Right 2016 & 2017   . ROTATOR CUFF REPAIR Left   . TONSILLECTOMY        Medications: Current Meds  Medication Sig  . aspirin 81 MG tablet Take 81 mg by mouth daily.    Marland Kitchen atorvastatin (LIPITOR) 40 MG tablet TAKE 1 TABLET DAILY  . carvedilol (COREG) 12.5 MG tablet TAKE 1 TABLET TWICE DAILY  WITH MEALS  . clobetasol cream (TEMOVATE) 0.05 % APPLY TO AFFECTED AREA TWICE A DAY  . clopidogrel (PLAVIX) 75 MG tablet Take 1 tablet (75 mg total) by mouth daily.  . isosorbide mononitrate (IMDUR) 30 MG 24 hr tablet Take 30 mg by mouth daily.  . meclizine (ANTIVERT) 12.5 MG tablet TAKE 1 TABLET (12.5 MG TOTAL) BY MOUTH 2 (TWO) TIMES DAILY AS NEEDED FOR DIZZINESS.  Marland Kitchen Mesalamine 800 MG TBEC TAKE 2 TABLETS (=1,600MG ) 3TIMES A DAY  . nitroGLYCERIN (NITROSTAT) 0.4 MG SL tablet Place 0.4 mg under the tongue every 5 (five) minutes as needed for chest pain.  . pantoprazole (PROTONIX) 40 MG tablet Take 1 tablet (40 mg total) by mouth daily.  . pramipexole (MIRAPEX) 1 MG tablet Take 1 tablet (1 mg total) by mouth at bedtime.   Current Facility-Administered Medications for the 10/06/17 encounter (Office Visit) with Kristopher Junes, NP  Medication  . ipratropium-albuterol (DUONEB) 0.5-2.5 (3) MG/3ML nebulizer solution 3 mL     Allergies: Allergies  Allergen Reactions  . Cephalexin Hives    Social History: The patient  reports that he quit smoking about 42 years ago. His smoking use included cigarettes. he has never used smokeless tobacco. He reports that he drinks about 9.0 oz of alcohol per week. He reports that he does not use drugs.   Family History: The patient's family history includes Diabetes in his maternal uncle and maternal uncle; Heart attack in his father; Multiple sclerosis in his brother and son; Throat cancer in his brother.   Review of Systems: Please see the history of present illness.   Otherwise, the review of systems is positive for none.   All other systems are reviewed and negative.   Physical Exam: VS:  BP (!) 138/100 (BP Location: Left Arm,  Patient Position: Sitting, Cuff Size: Normal)   Pulse 64   Ht 6\' 3"  (1.905 m)   Wt 225 lb 12.8 oz (102.4 kg)   SpO2 96% Comment: at rest  BMI 28.22 kg/m  .  BMI Body mass index is 28.22 kg/m.  Wt Readings from Last 3 Encounters:  10/06/17 225 lb 12.8 oz (102.4 kg)  09/09/17 220 lb (99.8 kg)  07/28/17 226 lb (102.5 kg)   BP is 180/100 by me.   General: Pleasant. Well developed, well nourished and in no acute distress.  HEENT: Normal.  Neck: Supple, no JVD, carotid bruits, or masses noted.  Cardiac: Regular rate and rhythm. No murmurs, rubs, or gallops. Trace edema left leg.  Respiratory:  Lungs are clear to auscultation bilaterally with normal work of breathing.  GI: Soft and nontender.  MS: No deformity or atrophy. Gait and ROM intact.  Skin: Warm and dry. Color is normal.  Neuro:  Strength and sensation are intact and no gross focal deficits noted.  Psych: Alert, appropriate and with normal affect.   LABORATORY DATA:  EKG:  EKG is not ordered today.  Lab Results  Component Value Date   WBC 5.2 06/24/2017   HGB 12.4 (L) 06/24/2017   HCT 37.8 (L) 06/24/2017   PLT 213.0 06/24/2017   GLUCOSE 117 (H) 06/24/2017   CHOL 138 04/05/2017   TRIG 250 (H) 04/05/2017   HDL 38 (L) 04/05/2017   LDLDIRECT 68.0 01/30/2016   LDLCALC 50 04/05/2017   ALT 19 06/24/2017   AST 18 06/24/2017   NA 139 06/24/2017   K 4.4 06/24/2017   CL 105 06/24/2017   CREATININE 1.26 06/24/2017   BUN 20 06/24/2017   CO2 26 06/24/2017   TSH 1.68 06/24/2017   PSA 0.21 01/30/2016   INR 0.9 02/29/2008   HGBA1C 5.7 10/11/2007     BNP (last 3 results) No results for input(s): BNP in the last 8760 hours.  ProBNP (last 3 results) No results for input(s): PROBNP in the last 8760 hours.   Other Studies Reviewed Today:  Myoview Impression from 11/2014 Exercise Capacity: Fair exercise capacity. BP Response: Hypertensive blood pressure response. Clinical Symptoms: No chest pain. ECG Impression:  No significant ST segment change suggestive of ischemia. Comparison with Prior Nuclear Study: Previous scan is normal    Overall Impression: Normal stress nuclear study.  LV Ejection Fraction: 70%. LV Wall Motion: NL LV Function; NL Wall Motion   Dorris Carnes    Assessment/Plan:  1. CAD - has had remote PCI - last Myoview from 2016 was stable. He remains on chronic nitrate therapy. Doing very well without chest pain. He has opted for medical management.   2. HTN - not controlled. Repeat BP by me is high. Most likely aggravated by too much salt.  Adding HCTZ 25 mg a day. Lab today and on return. I have asked him to try and monitor his BP at home some for me - he typically does not check.   3. HLD - lab today  Current medicines are reviewed with the patient today.  The patient does not have concerns regarding medicines other than what has been noted above.  The following changes have been made:  See above.  Labs/ tests ordered today include:    Orders Placed This Encounter  Procedures  . Basic metabolic panel  . CBC  . Hepatic function panel  . Lipid panel     Disposition:   FU with me in a month. Lab on return.   Patient is agreeable to this plan and will call if any problems develop in the interim.   SignedTruitt Merle, NP  10/06/2017 8:15 AM  [facility] [address] [place],   [address] Phone: [phone] Fax: [phone]

## 2017-10-06 NOTE — Patient Instructions (Addendum)
We will be checking the following labs today - BMET, CBC, HPF and lipids   Medication Instructions:    Continue with your current medicines. BUT  I am adding HCTZ 25 mg a day - this has been sent to your drug store    Testing/Procedures To Be Arranged:  N/A  Follow-Up:   See me in one month - will check lab on return - does not need to fast    Other Special Instructions:   N/A    If you need a refill on your cardiac medications before your next appointment, please call your pharmacy.   Call the [place] office at [phone] if you have any questions, problems or concerns.

## 2017-10-07 LAB — LIPID PANEL
Chol/HDL Ratio: 4.7 ratio (ref 0.0–5.0)
Cholesterol, Total: 201 mg/dL — ABNORMAL HIGH (ref 100–199)
HDL: 43 mg/dL (ref >39)
Triglycerides: 531 mg/dL — ABNORMAL HIGH (ref 0–149)

## 2017-10-07 LAB — HEPATIC FUNCTION PANEL
ALT: 25 IU/L (ref 0–44)
AST: 25 IU/L (ref 0–40)
Albumin: 4.5 g/dL (ref 3.5–4.8)
Alkaline Phosphatase: 86 IU/L (ref 39–117)
Bilirubin Total: 0.5 mg/dL (ref 0.0–1.2)
Bilirubin, Direct: 0.12 mg/dL (ref 0.00–0.40)
Total Protein: 6.3 g/dL (ref 6.0–8.5)

## 2017-10-07 LAB — BASIC METABOLIC PANEL
BUN/Creatinine Ratio: 14 (ref 10–24)
BUN: 17 mg/dL (ref 8–27)
CO2: 21 mmol/L (ref 20–29)
Calcium: 8.9 mg/dL (ref 8.6–10.2)
Chloride: 100 mmol/L (ref 96–106)
Creatinine, Ser: 1.22 mg/dL (ref 0.76–1.27)
GFR calc Af Amer: 65 mL/min/{1.73_m2} (ref >59)
GFR calc non Af Amer: 56 mL/min/{1.73_m2} — ABNORMAL LOW (ref >59)
Glucose: 124 mg/dL — ABNORMAL HIGH (ref 65–99)
Potassium: 4.2 mmol/L (ref 3.5–5.2)
Sodium: 139 mmol/L (ref 134–144)

## 2017-10-07 LAB — CBC
Hematocrit: 38.2 % (ref 37.5–51.0)
Hemoglobin: 12.9 g/dL — ABNORMAL LOW (ref 13.0–17.7)
MCH: 29.3 pg (ref 26.6–33.0)
MCHC: 33.8 g/dL (ref 31.5–35.7)
MCV: 87 fL (ref 79–97)
Platelets: 253 10*3/uL (ref 150–379)
RBC: 4.4 x10E6/uL (ref 4.14–5.80)
RDW: 15.5 % — ABNORMAL HIGH (ref 12.3–15.4)
WBC: 5.8 10*3/uL (ref 3.4–10.8)

## 2017-10-08 ENCOUNTER — Telehealth: Payer: Self-pay | Admitting: Nurse Practitioner

## 2017-10-08 NOTE — Telephone Encounter (Signed)
New message ° °Pt verbalized that he is returning call for RN °

## 2017-10-27 ENCOUNTER — Other Ambulatory Visit: Payer: Self-pay | Admitting: Adult Health

## 2017-10-27 DIAGNOSIS — Z760 Encounter for issue of repeat prescription: Secondary | ICD-10-CM

## 2017-10-27 DIAGNOSIS — E785 Hyperlipidemia, unspecified: Secondary | ICD-10-CM

## 2017-10-28 NOTE — Telephone Encounter (Signed)
Sent to the pharmacy by e-scribe. 

## 2017-11-03 ENCOUNTER — Ambulatory Visit: Payer: Federal, State, Local not specified - PPO | Admitting: Nurse Practitioner

## 2017-11-03 ENCOUNTER — Encounter: Payer: Self-pay | Admitting: Nurse Practitioner

## 2017-11-03 VITALS — BP 132/82 | HR 75 | Ht 76.0 in | Wt 225.8 lb

## 2017-11-03 DIAGNOSIS — I10 Essential (primary) hypertension: Secondary | ICD-10-CM

## 2017-11-03 DIAGNOSIS — I2510 Atherosclerotic heart disease of native coronary artery without angina pectoris: Secondary | ICD-10-CM | POA: Diagnosis not present

## 2017-11-03 DIAGNOSIS — E7800 Pure hypercholesterolemia, unspecified: Secondary | ICD-10-CM

## 2017-11-03 LAB — HEPATIC FUNCTION PANEL
ALT: 20 IU/L (ref 0–44)
AST: 19 IU/L (ref 0–40)
Albumin: 4.5 g/dL (ref 3.5–4.8)
Alkaline Phosphatase: 93 IU/L (ref 39–117)
Bilirubin Total: 0.6 mg/dL (ref 0.0–1.2)
Bilirubin, Direct: 0.14 mg/dL (ref 0.00–0.40)
Total Protein: 6.4 g/dL (ref 6.0–8.5)

## 2017-11-03 LAB — BASIC METABOLIC PANEL
BUN/Creatinine Ratio: 17 (ref 10–24)
BUN: 20 mg/dL (ref 8–27)
CO2: 25 mmol/L (ref 20–29)
Calcium: 9.6 mg/dL (ref 8.6–10.2)
Chloride: 98 mmol/L (ref 96–106)
Creatinine, Ser: 1.17 mg/dL (ref 0.76–1.27)
GFR calc Af Amer: 68 mL/min/{1.73_m2} (ref >59)
GFR calc non Af Amer: 59 mL/min/{1.73_m2} — ABNORMAL LOW (ref >59)
Glucose: 111 mg/dL — ABNORMAL HIGH (ref 65–99)
Potassium: 4.2 mmol/L (ref 3.5–5.2)
Sodium: 139 mmol/L (ref 134–144)

## 2017-11-03 LAB — LIPID PANEL
Chol/HDL Ratio: 5 ratio (ref 0.0–5.0)
Cholesterol, Total: 215 mg/dL — ABNORMAL HIGH (ref 100–199)
HDL: 43 mg/dL (ref >39)
Triglycerides: 582 mg/dL (ref 0–149)

## 2017-11-03 NOTE — Progress Notes (Signed)
CARDIOLOGY OFFICE NOTE  Date:  11/03/2017    Kristopher Wilson Date of Birth: 18-Dec-1935 Medical Record #[id number]  PCP:  Dorothyann Peng, NP  Cardiologist:  Servando Snare    Chief Complaint  Patient presents with  . Hypertension    1 month check     History of Present Illness: Kristopher Wilson is a 82 y.o. male who presents today for a follow up visit. Former patient of Dr. Claris Gladden - basically follows with me.   He has a history of CAD, ulcerative colitis, and shingles with post-herpetic neuralgia. Patient had PCI in 2000 with BMS to LAD and repeat PCI in 2009 with DES to PLV. ETT-myoview in 3/16 showed no evidence for ischemia or infarction. His activity has been chronically limited by a left leg neuropathy that has been thought to be due to post-herpetic neuralgia. He has been seen by neurology for this. He wears an orthotic on the left leg because of foot drop.He remains on aspirin and Plavix.   I have seen him multiple times over the past several years. He has had some intermittent chest pain - he has opted for medical management - he did have a Myoview updated in 2016 which was ok.   Last seen in January - BP was up - we adjusted his medicines - added HCTZX. Diet not good - he loves salt and alcohol continues to play a strong role.   Comes back today.Here alone. Has not had his medicines today - but BP ok here today. Readings noted from his phone reviewed.  He is fasting today. He was not last month - triglycerides were very high. He is on 40 mg of Lipitor. Alcohol use is daily - says 1 to drinks every day. No chest pain. Breathing is fine. Loves salt - especially country ham. Headed to the [place] in May for his 40th wedding anniversary. Overall, he feels like he is doing ok.    Past Medical History:  Diagnosis Date  . BACK PAIN, LUMBAR 10/18/2009  . Benign paroxysmal positional vertigo 09/30/2007  . CAD (coronary artery disease)   . Cancer [facility])    prostate   .  CHF (congestive heart failure) ([place])   . CKD (chronic kidney disease)   . COLONIC POLYPS, ADENOMATOUS, HX OF   . GERD (gastroesophageal reflux disease)   . Herpes zoster with other nervous system complications(053.19)   . Hyperlipidemia   . Hypertension   . Hyperthyroidism    pt denies  . Loss of hearing    Bil/has hearing aids in both ears  . Nerve damage of left foot   . PERIPHERAL NEUROPATHY 10/11/2007  . 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 PROSTATE CANCER, HX OF 07/07/2007   s/p prostatectomy  . RLS (restless legs syndrome)   . Ulcer   . ULCERATIVE COLITIS, LEFT SIDED 2000  . URTICARIA, ALLERGIC 08/05/2009  . VENOUS INSUFFICIENCY 07/28/2010    Past Surgical History:  Procedure Laterality Date  . ANGIOPLASTY  1990's/2004   stent placement 2  . COLONOSCOPY W/ BIOPSIES AND POLYPECTOMY  06/06/2009   adenomatous polyp, diverticulosis, colitis  . INGUINAL HERNIA REPAIR     x x  . ORIF FIBULA FRACTURE Left 03/02/2017   Procedure: OPEN REDUCTION INTERNAL FIXATION (ORIF) LEFT DISTAL FIBULA FRACTURE WITH DELTOID LIGAMENT REPAIR;  Surgeon: [name], MD;  Location: [place];  Service: Orthopedics;  Laterality: Left;  . PROSTATECTOMY    . RETINAL LASER PROCEDURE Right 2016 & 2017   .  ROTATOR CUFF REPAIR Left   . TONSILLECTOMY       Medications: Current Meds  Medication Sig  . aspirin 81 MG tablet Take 81 mg by mouth daily.    Marland Kitchen atorvastatin (LIPITOR) 40 MG tablet TAKE 1 TABLET DAILY  . carvedilol (COREG) 12.5 MG tablet TAKE 1 TABLET TWICE DAILY  WITH MEALS  . clobetasol cream (TEMOVATE) 0.05 % APPLY TO AFFECTED AREA TWICE A DAY  . clopidogrel (PLAVIX) 75 MG tablet Take 1 tablet (75 mg total) by mouth daily.  . hydrochlorothiazide (HYDRODIURIL) 25 MG tablet Take 1 tablet (25 mg total) by mouth daily.  . isosorbide mononitrate (IMDUR) 30 MG 24 hr tablet Take 30 mg by mouth daily.  . meclizine (ANTIVERT) 12.5 MG tablet TAKE 1 TABLET (12.5 MG TOTAL) BY MOUTH 2 (TWO) TIMES  DAILY AS NEEDED FOR DIZZINESS.  Marland Kitchen Mesalamine 800 MG TBEC TAKE 2 TABLETS (=1,600MG ) 3TIMES A DAY  . nitroGLYCERIN (NITROSTAT) 0.4 MG SL tablet Place 0.4 mg under the tongue every 5 (five) minutes as needed for chest pain.  . pantoprazole (PROTONIX) 40 MG tablet Take 1 tablet (40 mg total) by mouth daily.  . pramipexole (MIRAPEX) 1 MG tablet Take 1 tablet (1 mg total) by mouth at bedtime.  . triamcinolone cream (KENALOG) 0.1 % APPLY TO AFFECTED AREA TWICE A DAY (for ankle)  . [DISCONTINUED] predniSONE (DELTASONE) 10 MG tablet 40 mg x 3 days, 20 mg x 3 days, 10 mg x 3 days   Current Facility-Administered Medications for the 11/03/17 encounter (Office Visit) with Burtis Junes, NP  Medication  . ipratropium-albuterol (DUONEB) 0.5-2.5 (3) MG/3ML nebulizer solution 3 mL     Allergies: Allergies  Allergen Reactions  . Cephalexin Hives    Social History: The patient  reports that he quit smoking about 42 years ago. His smoking use included cigarettes. he has never used smokeless tobacco. He reports that he drinks about 9.0 oz of alcohol per week. He reports that he does not use drugs.   Family History: The patient's family history includes Diabetes in his maternal uncle and maternal uncle; Heart attack in his father; Multiple sclerosis in his brother and son; Throat cancer in his brother.   Review of Systems: Please see the history of present illness.   Otherwise, the review of systems is positive for none.   All other systems are reviewed and negative.   Physical Exam: VS:  BP 132/82 (BP Location: Left Arm, Patient Position: Sitting, Cuff Size: Normal)   Pulse 75   Ht 6\' 4"  (1.93 m)   Wt 225 lb 12.8 oz (102.4 kg)   SpO2 97% Comment: at rest  BMI 27.49 kg/m  .  BMI Body mass index is 27.49 kg/m.  Wt Readings from Last 3 Encounters:  11/03/17 225 lb 12.8 oz (102.4 kg)  10/06/17 225 lb 12.8 oz (102.4 kg)  09/09/17 220 lb (99.8 kg)    General: Pleasant. Well developed, well  nourished and in no acute distress.   HEENT: Normal.  Neck: Supple, no JVD, carotid bruits, or masses noted.  Cardiac: Regular rate and rhythm. No murmurs, rubs, or gallops. No edema.  Respiratory:  Lungs are clear to auscultation bilaterally with normal work of breathing.  GI: Soft and nontender.  MS: No deformity or atrophy. Gait and ROM intact.  Skin: Warm and dry. Color is normal.  Neuro:  Strength and sensation are intact and no gross focal deficits noted.  Psych: Alert, appropriate and with normal affect.  LABORATORY DATA:  EKG:  EKG is not ordered today.  Lab Results  Component Value Date   WBC 5.8 10/06/2017   HGB 12.9 (L) 10/06/2017   HCT 38.2 10/06/2017   PLT 253 10/06/2017   GLUCOSE 124 (H) 10/06/2017   CHOL 201 (H) 10/06/2017   TRIG 531 (H) 10/06/2017   HDL 43 10/06/2017   LDLDIRECT 68.0 01/30/2016   LDLCALC Comment 10/06/2017   ALT 25 10/06/2017   AST 25 10/06/2017   NA 139 10/06/2017   K 4.2 10/06/2017   CL 100 10/06/2017   CREATININE 1.22 10/06/2017   BUN 17 10/06/2017   CO2 21 10/06/2017   TSH 1.68 06/24/2017   PSA 0.21 01/30/2016   INR 0.9 02/29/2008   HGBA1C 5.7 10/11/2007     BNP (last 3 results) No results for input(s): BNP in the last 8760 hours.  ProBNP (last 3 results) No results for input(s): PROBNP in the last 8760 hours.   Other Studies Reviewed Today:  Myoview Impression from 11/2014 Exercise Capacity: Fair exercise capacity. BP Response: Hypertensive blood pressure response. Clinical Symptoms: No chest pain. ECG Impression: No significant ST segment change suggestive of ischemia. Comparison with Prior Nuclear Study: Previous scan is normal    Overall Impression: Normal stress nuclear study.  LV Ejection Fraction: 70%. LV Wall Motion: NL LV Function; NL Wall Motion   Dorris Carnes    Assessment/Plan:  1. CAD - has had remote PCI - last Myoview from 2016 was stable. He remains on chronic nitrate therapy. He  has no complaints of chest pain. Would continue with his current regimen.   2. HTN - BP looks better - readings from home ok. BMET today. I do not think he will cut back his salt use.   3. HLD - repeating lab today - may need to add fenofibrate.  Current medicines are reviewed with the patient today.  The patient does not have concerns regarding medicines other than what has been noted above.  The following changes have been made:  See above.  Labs/ tests ordered today include:    Orders Placed This Encounter  Procedures  . Basic metabolic panel  . Hepatic function panel  . Lipid panel     Disposition:   FU with me in 6 months.   Patient is agreeable to this plan and will call if any problems develop in the interim.   SignedTruitt Merle, NP  11/03/2017 8:36 AM  [facility] [facility] [address] [place], [place]  [address] Phone: [phone] Fax: [phone]

## 2017-11-03 NOTE — Patient Instructions (Addendum)
We will be checking the following labs today - BMET, HPF and lipids   Medication Instructions:    Continue with your current medicines.     Testing/Procedures To Be Arranged:  N/A  Follow-Up:   See me in 6 months with fasting labs    Other Special Instructions:   N/A    If you need a refill on your cardiac medications before your next appointment, please call your pharmacy.   Call the [place] office at [phone] if you have any questions, problems or concerns.

## 2017-11-05 ENCOUNTER — Other Ambulatory Visit: Payer: Self-pay | Admitting: *Deleted

## 2017-11-05 DIAGNOSIS — E7800 Pure hypercholesterolemia, unspecified: Secondary | ICD-10-CM

## 2017-11-05 MED ORDER — FENOFIBRATE 145 MG PO TABS: 145.0000 mg | ORAL_TABLET | Freq: Every day | ORAL | 9 refills | Status: DC

## 2017-12-02 ENCOUNTER — Other Ambulatory Visit: Payer: Federal, State, Local not specified - PPO

## 2017-12-02 DIAGNOSIS — E7800 Pure hypercholesterolemia, unspecified: Secondary | ICD-10-CM

## 2017-12-02 LAB — LIPID PANEL
Chol/HDL Ratio: 4 ratio (ref 0.0–5.0)
Cholesterol, Total: 160 mg/dL (ref 100–199)
HDL: 40 mg/dL (ref >39)
LDL Calculated: 86 mg/dL (ref 0–99)
Triglycerides: 171 mg/dL — ABNORMAL HIGH (ref 0–149)
VLDL Cholesterol Cal: 34 mg/dL (ref 5–40)

## 2017-12-02 LAB — HEPATIC FUNCTION PANEL
ALT: 21 IU/L (ref 0–44)
AST: 20 IU/L (ref 0–40)
Albumin: 4.3 g/dL (ref 3.5–4.8)
Alkaline Phosphatase: 58 IU/L (ref 39–117)
Bilirubin Total: 0.4 mg/dL (ref 0.0–1.2)
Bilirubin, Direct: 0.12 mg/dL (ref 0.00–0.40)
Total Protein: 6.1 g/dL (ref 6.0–8.5)

## 2017-12-03 ENCOUNTER — Telehealth: Payer: Self-pay | Admitting: *Deleted

## 2017-12-03 NOTE — Telephone Encounter (Signed)
DPR ok to s/w pt's wife who has been notified of lab results by phone with verbal understanding. Pt's wife thanked me for the call.

## 2017-12-03 NOTE — Telephone Encounter (Signed)
-----   Message from Burtis Junes, NP sent at 12/02/2017  4:00 PM EDT ----- Ok to report. So Much Better!!!  Stay on current regimen.  Recheck on return visit

## 2017-12-14 ENCOUNTER — Other Ambulatory Visit: Payer: Self-pay | Admitting: Nurse Practitioner

## 2017-12-14 ENCOUNTER — Other Ambulatory Visit: Payer: Self-pay | Admitting: Adult Health

## 2017-12-15 NOTE — Telephone Encounter (Signed)
Sent to the pharmacy by e-scribe. 

## 2018-02-07 ENCOUNTER — Encounter: Payer: Self-pay | Admitting: Family Medicine

## 2018-02-07 ENCOUNTER — Ambulatory Visit: Payer: Federal, State, Local not specified - PPO | Admitting: Family Medicine

## 2018-02-07 VITALS — BP 130/84 | HR 84 | Temp 97.6°F | Ht 76.0 in | Wt 223.1 lb

## 2018-02-07 DIAGNOSIS — Z209 Contact with and (suspected) exposure to unspecified communicable disease: Secondary | ICD-10-CM | POA: Diagnosis not present

## 2018-02-07 DIAGNOSIS — R21 Rash and other nonspecific skin eruption: Secondary | ICD-10-CM

## 2018-02-07 LAB — CBC WITH DIFFERENTIAL/PLATELET
BASOS ABS: 0 10*3/uL (ref 0.0–0.1)
BASOS PCT: 0.5 % (ref 0.0–3.0)
Eosinophils Absolute: 0.3 10*3/uL (ref 0.0–0.7)
Eosinophils Relative: 4.5 % (ref 0.0–5.0)
HEMATOCRIT: 40.4 % (ref 39.0–52.0)
HEMOGLOBIN: 13.6 g/dL (ref 13.0–17.0)
LYMPHS PCT: 36.3 % (ref 12.0–46.0)
Lymphs Abs: 2.5 10*3/uL (ref 0.7–4.0)
MCHC: 33.8 g/dL (ref 30.0–36.0)
MCV: 86.4 fl (ref 78.0–100.0)
MONOS PCT: 8.6 % (ref 3.0–12.0)
Monocytes Absolute: 0.6 10*3/uL (ref 0.1–1.0)
NEUTROS ABS: 3.5 10*3/uL (ref 1.4–7.7)
Neutrophils Relative %: 50.1 % (ref 43.0–77.0)
PLATELETS: 259 10*3/uL (ref 150.0–400.0)
RBC: 4.68 Mil/uL (ref 4.22–5.81)
RDW: 14.5 % (ref 11.5–15.5)
WBC: 6.9 10*3/uL (ref 4.0–10.5)

## 2018-02-07 NOTE — Progress Notes (Signed)
   Subjective:    Patient ID: Kristopher Wilson, male    DOB: July 28, 1936, 82 y.o.   MRN: [id number]  HPI Here to follow up an urgent care visit in [place], [place] on 02-05-18 for tick bites. He was bitten by 2 ticks about 4 weeks ago, which he pulled off. Both of these were on the right leg. He felt fine for awhile but then last week he developed a rash over both legs. No fever. At the urgent care he was started on a 14 day course of Doxycyline. He was told to follow up with [place].    Review of Systems  Constitutional: Negative.   Respiratory: Negative.   Cardiovascular: Negative.   Skin: Positive for rash.  Neurological: Negative.        Objective:   Physical Exam  Constitutional: He appears well-developed and well-nourished.  Cardiovascular: Normal rate, regular rhythm, normal heart sounds and intact distal pulses.  Pulmonary/Chest: Effort normal and breath sounds normal.  Skin:  Scattered small macular tan spots on both legs           Assessment & Plan:  Tick bites. He will take the Doxycycline as directed. We will check a CBC and a Lyme titer today.  Alysia Penna, MD

## 2018-02-08 LAB — B. BURGDORFI ANTIBODIES: B burgdorferi Ab IgG+IgM: <0.9 index

## 2018-02-10 ENCOUNTER — Other Ambulatory Visit: Payer: Self-pay | Admitting: Nurse Practitioner

## 2018-02-10 MED ORDER — ISOSORBIDE MONONITRATE ER 30 MG PO TB24: 30.0000 mg | ORAL_TABLET | Freq: Every day | ORAL | 2 refills | Status: DC

## 2018-02-11 ENCOUNTER — Telehealth: Payer: Self-pay | Admitting: Nurse Practitioner

## 2018-02-11 MED ORDER — ISOSORBIDE MONONITRATE ER 60 MG PO TB24: 60.0000 mg | ORAL_TABLET | Freq: Every day | ORAL | 3 refills | Status: DC

## 2018-02-11 NOTE — Telephone Encounter (Signed)
Spoke with Maudie Mercury from [facility] who is questioning whether the patient is supposed to be taking 30 mg Imdur or 60 mg?  He was on Imdur 60 mg 02/24/2017 and has been taking that amount since then, but yesterday 6/6 a prescription for 30 mg was sent in.  Just trying to see if something has changed..  Please advise, thank you

## 2018-02-11 NOTE — Telephone Encounter (Signed)
New message    Pt c/o medication issue:  1. Name of Medication: isosorbide mononitrate (IMDUR) 30 MG 24 hr tablet  2. How are you currently taking this medication (dosage and times per day)? Take 1 tablet (30 mg total) by mouth daily.  3. Are you having a reaction (difficulty breathing--STAT)? No  4. What is your medication issue? clarification on dosage & direction patient is saying 60 mg verse 30 mg.

## 2018-02-11 NOTE — Telephone Encounter (Signed)
He has been on 60 mg of Imdur since June of 2018.   Please send in this dose.   Burtis Junes, RN, [facility] [address] [place], [place]  [phone]

## 2018-02-11 NOTE — Telephone Encounter (Signed)
Script sent for Imdur 60 mg number 90 with 3 refills to [facility] in [place]

## 2018-02-18 LAB — PSA: PSA: 0.14

## 2018-03-08 ENCOUNTER — Encounter: Payer: Self-pay | Admitting: Family Medicine

## 2018-04-27 ENCOUNTER — Ambulatory Visit: Payer: Federal, State, Local not specified - PPO | Admitting: Nurse Practitioner

## 2018-04-27 ENCOUNTER — Encounter: Payer: Self-pay | Admitting: Nurse Practitioner

## 2018-04-27 VITALS — BP 120/60 | HR 65 | Ht 75.0 in | Wt 218.0 lb

## 2018-04-27 DIAGNOSIS — I2510 Atherosclerotic heart disease of native coronary artery without angina pectoris: Secondary | ICD-10-CM

## 2018-04-27 DIAGNOSIS — I10 Essential (primary) hypertension: Secondary | ICD-10-CM

## 2018-04-27 DIAGNOSIS — E7800 Pure hypercholesterolemia, unspecified: Secondary | ICD-10-CM | POA: Diagnosis not present

## 2018-04-27 NOTE — Progress Notes (Signed)
CARDIOLOGY OFFICE NOTE  Date:  04/27/2018    Kristopher Wilson Date of Birth: 1936-07-25 Medical Record #[id number]  PCP:  Dorothyann Peng, NP  Cardiologist:  Servando Snare     Chief Complaint  Patient presents with  . Coronary Artery Disease    6 month check     History of Present Illness: Kristopher Wilson is a 82 y.o. male who presents today for a 6 month check. He is a former patient of Dr. Claris Gladden - basically follows with me.   He has a history of CAD, ulcerative colitis, and shingles with post-herpetic neuralgia. Patient had PCI in 2000 with BMS to LAD and repeat PCI in 2009 with DES to PLV. ETT-myoview in 3/16 showed no evidence for ischemia or infarction. His activity has been chronicallylimited by a left leg neuropathy that has been thought to be due to post-herpetic neuralgia. He has been seen by neurology for this. He wears an orthotic on the left leg because of foot drop.He remains on aspirin and Plavix.   I have seen him multiple times over the past several years. He has had some intermittent chest pain - he has opted for medical management - he did have a Myoview updated in 2016 which was ok. We have had to adjust his medicines for his BP. Salt and alcohol continue to play a role. Last seen in February.   Comes back today.Herealone. He feels like he is doing ok. Having more issues with his golf game. Only one episode of chest pain - otherwise has done well - this occurred while out in the heat - did not need NTG - just drank water and then felt better. BP looks good. He has lost weight - says he is trying. Has had labs earlier this year - those are reviewed. Overall, he feels like he is doing well.   Past Medical History:  Diagnosis Date  . BACK PAIN, LUMBAR 10/18/2009  . Benign paroxysmal positional vertigo 09/30/2007  . CAD (coronary artery disease)   . Cancer [facility])    prostate   . CHF (congestive heart failure) ([place])   . CKD (chronic kidney disease)   .  COLONIC POLYPS, ADENOMATOUS, HX OF   . GERD (gastroesophageal reflux disease)   . Herpes zoster with other nervous system complications(053.19)   . Hyperlipidemia   . Hypertension   . Hyperthyroidism    pt denies  . Loss of hearing    Bil/has hearing aids in both ears  . Nerve damage of left foot   . PERIPHERAL NEUROPATHY 10/11/2007  . 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 PROSTATE CANCER, HX OF 07/07/2007   s/p prostatectomy  . RLS (restless legs syndrome)   . Ulcer   . ULCERATIVE COLITIS, LEFT SIDED 2000  . URTICARIA, ALLERGIC 08/05/2009  . VENOUS INSUFFICIENCY 07/28/2010    Past Surgical History:  Procedure Laterality Date  . ANGIOPLASTY  1990's/2004   stent placement 2  . COLONOSCOPY W/ BIOPSIES AND POLYPECTOMY  06/06/2009   adenomatous polyp, diverticulosis, colitis  . INGUINAL HERNIA REPAIR     x x  . ORIF FIBULA FRACTURE Left 03/02/2017   Procedure: OPEN REDUCTION INTERNAL FIXATION (ORIF) LEFT DISTAL FIBULA FRACTURE WITH DELTOID LIGAMENT REPAIR;  Surgeon: [name], MD;  Location: [place];  Service: Orthopedics;  Laterality: Left;  . PROSTATECTOMY    . RETINAL LASER PROCEDURE Right 2016 & 2017   . ROTATOR CUFF REPAIR Left   . TONSILLECTOMY  Medications: Current Meds  Medication Sig  . aspirin 81 MG tablet Take 81 mg by mouth daily.    Marland Kitchen atorvastatin (LIPITOR) 40 MG tablet TAKE 1 TABLET DAILY  . carvedilol (COREG) 12.5 MG tablet TAKE 1 TABLET TWICE DAILY  WITH MEALS  . clopidogrel (PLAVIX) 75 MG tablet Take 75 mg by mouth daily.  . fenofibrate (TRICOR) 145 MG tablet Take 1 tablet (145 mg total) by mouth daily.  . hydrochlorothiazide (HYDRODIURIL) 25 MG tablet Take 1 tablet (25 mg total) by mouth daily.  . isosorbide mononitrate (IMDUR) 60 MG 24 hr tablet Take 1 tablet (60 mg total) by mouth daily.  . meclizine (ANTIVERT) 12.5 MG tablet TAKE 1 TABLET (12.5 MG TOTAL) BY MOUTH 2 (TWO) TIMES DAILY AS NEEDED FOR DIZZINESS.  Marland Kitchen Mesalamine 800 MG TBEC TAKE 2  TABLETS 3 TIMES A   DAY  . nitroGLYCERIN (NITROSTAT) 0.4 MG SL tablet Place 0.4 mg under the tongue every 5 (five) minutes as needed for chest pain.  . pantoprazole (PROTONIX) 40 MG tablet Take 1 tablet (40 mg total) by mouth daily.  . pramipexole (MIRAPEX) 1 MG tablet Take 1 tablet (1 mg total) by mouth at bedtime.  . [DISCONTINUED] clopidogrel (PLAVIX) 75 MG tablet TAKE 1 TABLET DAILY (Patient taking differently: Take 75 mg by mouth daily. )   Current Facility-Administered Medications for the 04/27/18 encounter (Office Visit) with Burtis Junes, NP  Medication  . ipratropium-albuterol (DUONEB) 0.5-2.5 (3) MG/3ML nebulizer solution 3 mL     Allergies: Allergies  Allergen Reactions  . Cephalexin Hives    Social History: The patient  reports that he quit smoking about 42 years ago. His smoking use included cigarettes. He has never used smokeless tobacco. He reports that he drinks about 15.0 standard drinks of alcohol per week. He reports that he does not use drugs.   Family History: The patient's family history includes Diabetes in his maternal uncle and maternal uncle; Heart attack in his father; Multiple sclerosis in his brother and son; Throat cancer in his brother.   Review of Systems: Please see the history of present illness.   Otherwise, the review of systems is positive for none.   All other systems are reviewed and negative.   Physical Exam: VS:  BP 120/60 (BP Location: Left Arm, Patient Position: Sitting, Cuff Size: Normal)   Pulse 65   Ht 6\' 3"  (1.905 m)   Wt 218 lb (98.9 kg)   BMI 27.25 kg/m  .  BMI Body mass index is 27.25 kg/m.  Wt Readings from Last 3 Encounters:  04/27/18 218 lb (98.9 kg)  02/07/18 223 lb 1.6 oz (101.2 kg)  11/03/17 225 lb 12.8 oz (102.4 kg)    General: Pleasant. Well developed, well nourished and in no acute distress.  He has lost 7 pounds.  HEENT: Normal.  Neck: Supple, no JVD, carotid bruits, or masses noted.  Cardiac: Regular rate and  rhythm. Heart tones are distant. No edema.  Respiratory:  Lungs are clear to auscultation bilaterally with normal work of breathing.  GI: Soft and nontender.  MS: No deformity or atrophy. Gait and ROM intact.  Skin: Warm and dry. Color is normal.  Neuro:  Strength and sensation are intact and no gross focal deficits noted.  Psych: Alert, appropriate and with normal affect.   LABORATORY DATA:  EKG:  EKG is ordered today. This demonstrates NSR with PVC.  Lab Results  Component Value Date   WBC 6.9 02/07/2018   HGB  13.6 02/07/2018   HCT 40.4 02/07/2018   PLT 259.0 02/07/2018   GLUCOSE 111 (H) 11/03/2017   CHOL 160 12/02/2017   TRIG 171 (H) 12/02/2017   HDL 40 12/02/2017   LDLDIRECT 68.0 01/30/2016   LDLCALC 86 12/02/2017   ALT 21 12/02/2017   AST 20 12/02/2017   NA 139 11/03/2017   K 4.2 11/03/2017   CL 98 11/03/2017   CREATININE 1.17 11/03/2017   BUN 20 11/03/2017   CO2 25 11/03/2017   TSH 1.68 06/24/2017   PSA 0.14 02/18/2018   INR 0.9 02/29/2008   HGBA1C 5.7 10/11/2007     BNP (last 3 results) No results for input(s): BNP in the last 8760 hours.  ProBNP (last 3 results) No results for input(s): PROBNP in the last 8760 hours.   Other Studies Reviewed Today:  Myoview Impression from 11/2014 Exercise Capacity: Fair exercise capacity. BP Response: Hypertensive blood pressure response. Clinical Symptoms: No chest pain. ECG Impression: No significant ST segment change suggestive of ischemia. Comparison with Prior Nuclear Study: Previous scan is normal    Overall Impression: Normal stress nuclear study.  LV Ejection Fraction: 70%. LV Wall Motion: NL LV Function; NL Wall Motion   Dorris Carnes    Assessment/Plan:  1. CAD - has had remote PCI - last Myoview from 2016 was stable. He remains on chronic nitrate therapy. He has stable symptoms. I have left him on his current regimen.   2. HTN - His BP is great here today - he has lost some weight -  no changes made.   3. HLD - labs from earlier this year noted. Will check on return.   Current medicines are reviewed with the patient today.  The patient does not have concerns regarding medicines other than what has been noted above.  The following changes have been made:  See above.  Labs/ tests ordered today include:    Orders Placed This Encounter  Procedures  . EKG 12-Lead     Disposition:   FU with me in 6 months.   Patient is agreeable to this plan and will call if any problems develop in the interim.   SignedTruitt Merle, NP  04/27/2018 9:31 AM  [facility] [address] [place], [place]  [address] Phone: [phone] Fax: [phone]

## 2018-04-27 NOTE — Patient Instructions (Addendum)
We will be checking the following labs today - NONE   Medication Instructions:    Continue with your current medicines.     Testing/Procedures To Be Arranged:  N/A  Follow-Up:   See me in 6 months with fasting labs    Other Special Instructions:   N/A    If you need a refill on your cardiac medications before your next appointment, please call your pharmacy.   Call the [facility] office at [phone] if you have any questions, problems or concerns.      

## 2018-05-10 ENCOUNTER — Encounter: Payer: Self-pay | Admitting: Adult Health

## 2018-05-10 ENCOUNTER — Ambulatory Visit: Payer: Federal, State, Local not specified - PPO | Admitting: Adult Health

## 2018-05-10 VITALS — BP 110/70 | Temp 97.7°F | Wt 216.0 lb

## 2018-05-10 DIAGNOSIS — N62 Hypertrophy of breast: Secondary | ICD-10-CM | POA: Diagnosis not present

## 2018-05-10 NOTE — Progress Notes (Signed)
Subjective:    Patient ID: Kristopher Wilson, male    DOB: [birth year], 82 y.o.   MRN: [id number]  HPI 82 year old male who  has a past medical history of BACK PAIN, LUMBAR (10/18/2009), Benign paroxysmal positional vertigo (09/30/2007), CAD (coronary artery disease), Cancer ([place]), CHF (congestive heart failure) ([place]), CKD (chronic kidney disease), COLONIC POLYPS, ADENOMATOUS, HX OF, GERD (gastroesophageal reflux disease), Herpes zoster with other nervous system complications(053.19), Hyperlipidemia, Hypertension, Hyperthyroidism, Loss of hearing, Nerve damage of left foot, PERIPHERAL NEUROPATHY (10/11/2007), POSTHERPETIC NEURALGIA (07/03/2010), PROSTATE CANCER, HX OF (07/07/2007), RLS (restless legs syndrome), Ulcer, ULCERATIVE COLITIS, LEFT SIDED (2000), URTICARIA, ALLERGIC (08/05/2009), and VENOUS INSUFFICIENCY (07/28/2010).  He presents to the office today for the complaint of pain in right breast x 3-4 weeks. Reports intermittent soreness of breast, over nipple during that time frame. Also reports " feeling a lump".  Review of Systems See HPI   Past Medical History:  Diagnosis Date  . BACK PAIN, LUMBAR 10/18/2009  . Benign paroxysmal positional vertigo 09/30/2007  . CAD (coronary artery disease)   . Cancer [facility])    prostate   . CHF (congestive heart failure) ([place])   . CKD (chronic kidney disease)   . COLONIC POLYPS, ADENOMATOUS, HX OF   . GERD (gastroesophageal reflux disease)   . Herpes zoster with other nervous system complications(053.19)   . Hyperlipidemia   . Hypertension   . Hyperthyroidism    pt denies  . Loss of hearing    Bil/has hearing aids in both ears  . Nerve damage of left foot   . PERIPHERAL NEUROPATHY 10/11/2007  . 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 PROSTATE CANCER, HX OF 07/07/2007   s/p prostatectomy  . RLS (restless legs syndrome)   . Ulcer   . ULCERATIVE COLITIS, LEFT SIDED 2000  . URTICARIA, ALLERGIC 08/05/2009  . VENOUS INSUFFICIENCY 07/28/2010    Social  History   Socioeconomic History  . Marital status: Married    Spouse name: Not on file  . Number of children: Not on file  . Years of education: Not on file  . Highest education level: Not on file  Occupational History  . Occupation: retired    Fish farm manager: RETIRED  . Occupation: [employer]  Social Needs  . Financial resource strain: Not on file  . Food insecurity:    Worry: Not on file    Inability: Not on file  . Transportation needs:    Medical: Not on file    Non-medical: Not on file  Tobacco Use  . Smoking status: Former Smoker    Types: Cigarettes    Last attempt to quit: 09/08/1975    Years since quitting: 42.6  . Smokeless tobacco: Never Used  Substance and Sexual Activity  . Alcohol use: Yes    Alcohol/week: 15.0 standard drinks    Types: 15 Standard drinks or equivalent per week    Comment: 1-2 "high balls" daily  . Drug use: No  . Sexual activity: Not on file  Lifestyle  . Physical activity:    Days per week: Not on file    Minutes per session: Not on file  . Stress: Not on file  Relationships  . Social connections:    Talks on phone: Not on file    Gets together: Not on file    Attends religious service: Not on file    Active member of club or organization: Not on file    Attends meetings of clubs or organizations: Not  on file    Relationship status: Not on file  . Intimate partner violence:    Fear of current or ex partner: Not on file    Emotionally abused: Not on file    Physically abused: Not on file    Forced sexual activity: Not on file  Other Topics Concern  . 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 All live in [place] area   Works part time at [employer]    Past Surgical History:  Procedure Laterality Date  . ANGIOPLASTY  1990's/2004   stent placement 2  . COLONOSCOPY W/ BIOPSIES AND POLYPECTOMY  06/06/2009   adenomatous polyp,  diverticulosis, colitis  . INGUINAL HERNIA REPAIR     x x  . ORIF FIBULA FRACTURE Left 03/02/2017   Procedure: OPEN REDUCTION INTERNAL FIXATION (ORIF) LEFT DISTAL FIBULA FRACTURE WITH DELTOID LIGAMENT REPAIR;  Surgeon: [name], MD;  Location: [facility];  Service: Orthopedics;  Laterality: Left;  . PROSTATECTOMY    . RETINAL LASER PROCEDURE Right 2016 & 2017   . ROTATOR CUFF REPAIR Left   . TONSILLECTOMY      Family History  Problem Relation Age of Onset  . Heart attack Father   . Throat cancer Brother   . Multiple sclerosis Brother   . Multiple sclerosis Son   . Diabetes Maternal Uncle        x 2  . Diabetes Maternal Uncle   . Colon cancer Neg Hx   . Stroke Neg Hx     Allergies  Allergen Reactions  . Cephalexin Hives    Current Outpatient Medications on File Prior to Visit  Medication Sig Dispense Refill  . aspirin 81 MG tablet Take 81 mg by mouth daily.      Marland Kitchen atorvastatin (LIPITOR) 40 MG tablet TAKE 1 TABLET DAILY 90 tablet 2  . carvedilol (COREG) 12.5 MG tablet TAKE 1 TABLET TWICE DAILY  WITH MEALS 180 tablet 2  . clopidogrel (PLAVIX) 75 MG tablet Take 75 mg by mouth daily.    . fenofibrate (TRICOR) 145 MG tablet Take 1 tablet (145 mg total) by mouth daily. 30 tablet 9  . isosorbide mononitrate (IMDUR) 60 MG 24 hr tablet Take 1 tablet (60 mg total) by mouth daily. 90 tablet 3  . meclizine (ANTIVERT) 12.5 MG tablet TAKE 1 TABLET (12.5 MG TOTAL) BY MOUTH 2 (TWO) TIMES DAILY AS NEEDED FOR DIZZINESS. 30 tablet 0  . Mesalamine 800 MG TBEC TAKE 2 TABLETS 3 TIMES A   DAY 540 tablet 1  . nitroGLYCERIN (NITROSTAT) 0.4 MG SL tablet Place 0.4 mg under the tongue every 5 (five) minutes as needed for chest pain.    . pantoprazole (PROTONIX) 40 MG tablet Take 1 tablet (40 mg total) by mouth daily. 30 tablet 0  . pramipexole (MIRAPEX) 1 MG tablet Take 1 tablet (1 mg total) by mouth at bedtime. 90 tablet 3  . hydrochlorothiazide (HYDRODIURIL) 25 MG tablet Take 1 tablet (25 mg  total) by mouth daily. 90 tablet 3   Current Facility-Administered Medications on File Prior to Visit  Medication Dose Route Frequency Provider Last Rate Last Dose  . ipratropium-albuterol (DUONEB) 0.5-2.5 (3) MG/3ML nebulizer solution 3 mL  3 mL Nebulization Once Hershy Flenner, NP        BP 110/70   Temp 97.7 F (36.5 C)  Wt 216 lb (98 kg)   BMI 27.00 kg/m       Objective:   Physical Exam  Constitutional: He is oriented to person, place, and time. He appears well-developed and well-nourished. No distress.  Cardiovascular: Normal rate, regular rhythm, normal heart sounds and intact distal pulses.  Pulmonary/Chest: Effort normal and breath sounds normal. Right breast exhibits mass (Silver dollar sized grandular tissue, centrally located under the nipple,  symmetrical in shape, tender to palpation. ) and tenderness. Right breast exhibits no inverted nipple, no nipple discharge and no skin change.  Neurological: He is alert and oriented to person, place, and time.  Skin: He is not diaphoretic.  Nursing note and vitals reviewed.     Assessment & Plan:  1. Gynecomastia, male - Educated patient on benign nature of gynecomastia.  - He is not taking any OTC medications that may cause this issues  - Prescription meds, PPI's can cause this. Advised he can try taking Protonix every other day to see if this helps resolve  - Follow up in 2-3 months if not resolved or sooner if needed  Dorothyann Peng, NP

## 2018-05-31 ENCOUNTER — Other Ambulatory Visit: Payer: Self-pay | Admitting: Adult Health

## 2018-06-13 ENCOUNTER — Other Ambulatory Visit: Payer: Self-pay | Admitting: Adult Health

## 2018-06-13 DIAGNOSIS — E785 Hyperlipidemia, unspecified: Secondary | ICD-10-CM

## 2018-06-13 DIAGNOSIS — Z760 Encounter for issue of repeat prescription: Secondary | ICD-10-CM

## 2018-06-15 ENCOUNTER — Encounter: Payer: Self-pay | Admitting: *Deleted

## 2018-06-21 ENCOUNTER — Other Ambulatory Visit: Payer: Self-pay | Admitting: Adult Health

## 2018-06-22 NOTE — Telephone Encounter (Signed)
Sent to the pharmacy.  I have scheduled the pt for 07/26/18 for cpx and fasting lab work.

## 2018-06-26 ENCOUNTER — Other Ambulatory Visit: Payer: Self-pay | Admitting: Nurse Practitioner

## 2018-07-26 ENCOUNTER — Ambulatory Visit (INDEPENDENT_AMBULATORY_CARE_PROVIDER_SITE_OTHER): Payer: Federal, State, Local not specified - PPO | Admitting: Adult Health

## 2018-07-26 ENCOUNTER — Encounter: Payer: Self-pay | Admitting: Adult Health

## 2018-07-26 VITALS — BP 120/90 | Temp 97.6°F | Ht 74.5 in | Wt 222.0 lb

## 2018-07-26 DIAGNOSIS — I25119 Atherosclerotic heart disease of native coronary artery with unspecified angina pectoris: Secondary | ICD-10-CM

## 2018-07-26 DIAGNOSIS — Z0000 Encounter for general adult medical examination without abnormal findings: Secondary | ICD-10-CM | POA: Diagnosis not present

## 2018-07-26 DIAGNOSIS — Z23 Encounter for immunization: Secondary | ICD-10-CM | POA: Diagnosis not present

## 2018-07-26 DIAGNOSIS — E7800 Pure hypercholesterolemia, unspecified: Secondary | ICD-10-CM | POA: Diagnosis not present

## 2018-07-26 DIAGNOSIS — I10 Essential (primary) hypertension: Secondary | ICD-10-CM

## 2018-07-26 LAB — CBC WITH DIFFERENTIAL/PLATELET
BASOS PCT: 0.5 % (ref 0.0–3.0)
Basophils Absolute: 0 10*3/uL (ref 0.0–0.1)
EOS PCT: 3.9 % (ref 0.0–5.0)
Eosinophils Absolute: 0.2 10*3/uL (ref 0.0–0.7)
HCT: 42.9 % (ref 39.0–52.0)
HEMOGLOBIN: 14.6 g/dL (ref 13.0–17.0)
LYMPHS ABS: 1.6 10*3/uL (ref 0.7–4.0)
Lymphocytes Relative: 28.1 % (ref 12.0–46.0)
MCHC: 34.1 g/dL (ref 30.0–36.0)
MCV: 85.9 fl (ref 78.0–100.0)
MONOS PCT: 7.5 % (ref 3.0–12.0)
Monocytes Absolute: 0.4 10*3/uL (ref 0.1–1.0)
NEUTROS PCT: 60 % (ref 43.0–77.0)
Neutro Abs: 3.5 10*3/uL (ref 1.4–7.7)
Platelets: 260 10*3/uL (ref 150.0–400.0)
RBC: 4.99 Mil/uL (ref 4.22–5.81)
RDW: 13.8 % (ref 11.5–15.5)
WBC: 5.8 10*3/uL (ref 4.0–10.5)

## 2018-07-26 LAB — COMPREHENSIVE METABOLIC PANEL
ALBUMIN: 4.4 g/dL (ref 3.5–5.2)
ALK PHOS: 55 U/L (ref 39–117)
ALT: 28 U/L (ref 0–53)
AST: 22 U/L (ref 0–37)
BUN: 21 mg/dL (ref 6–23)
CO2: 26 mEq/L (ref 19–32)
Calcium: 9.6 mg/dL (ref 8.4–10.5)
Chloride: 103 mEq/L (ref 96–112)
Creatinine, Ser: 1.28 mg/dL (ref 0.40–1.50)
GFR: 57.45 mL/min — AB (ref >60.00)
Glucose, Bld: 117 mg/dL — ABNORMAL HIGH (ref 70–99)
Potassium: 4.1 mEq/L (ref 3.5–5.1)
Sodium: 138 mEq/L (ref 135–145)
TOTAL PROTEIN: 6.4 g/dL (ref 6.0–8.3)
Total Bilirubin: 0.8 mg/dL (ref 0.2–1.2)

## 2018-07-26 LAB — LIPID PANEL
CHOL/HDL RATIO: 4
Cholesterol: 190 mg/dL (ref 0–200)
HDL: 42.7 mg/dL (ref >39.00)
NonHDL: 146.91
Triglycerides: 243 mg/dL — ABNORMAL HIGH (ref 0.0–149.0)
VLDL: 48.6 mg/dL — AB (ref 0.0–40.0)

## 2018-07-26 LAB — LDL CHOLESTEROL, DIRECT: LDL DIRECT: 111 mg/dL

## 2018-07-26 LAB — TSH: TSH: 2.42 u[IU]/mL (ref 0.35–4.50)

## 2018-07-26 NOTE — Patient Instructions (Signed)
It was great seeing you today   Continue to stay active and work on diet   We will follow up with you regarding your blood work   Follow up in one year or sooner if needed

## 2018-07-26 NOTE — Progress Notes (Signed)
Subjective:    Patient ID: Kristopher Wilson, male    DOB: 20-Jan-1936, 82 y.o.   MRN: [id number]  HPI Patient presents for yearly preventative medicine examination. 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   Hyperlipidemia -takes aspirin 81 mg and Lipitor 40 mg.  Fenofibrate was added by cardiology  Lab Results  Component Value Date   CHOL 160 12/02/2017   HDL 40 12/02/2017   LDLCALC 86 12/02/2017   LDLDIRECT 68.0 01/30/2016   TRIG 171 (H) 12/02/2017   CHOLHDL 4.0 12/02/2017   Hypertension -takes Coreg 12.5 mg twice daily  BP Readings from Last 3 Encounters:  07/26/18 120/90  05/10/18 110/70  04/27/18 120/60   CAD -had PCI in 2000 with BMS to LAD and repeat PSI in 2009 with DES to PLV.  Seen by cardiology.  On chronic nitrate therapy, ASA and Plavix. denies any chest pain or shortness of breath  H/O prostate cancer s/p Prostatectomy. Is followed by Urology twice a year   Treatment Team - Urology- [facility] - Twice a year - Cardiology - NP Servando Snare - Twice a year - GI - Carlean Purl - PRN - [place] - Once a year - Dermatology - yearly   All immunizations and health maintenance protocols were reviewed with the patient and needed orders were placed. Due for seasonal flu   Appropriate  screening laboratory values were ordered for the patient including screening of hyperlipidemia, renal function and hepatic function.  Medication reconciliation,  past medical history, social history, problem list and allergies were reviewed in detail with the patient  Goals were established with regard to weight loss, exercise, and  diet in compliance with medications. He stays active and enjoys wood working. His diet is fair to ok, likes his sodium and country ham   End of life planning was discussed. He has an advanced directive and living will.   He has no acute complaints.   Review of Systems  Constitutional: Negative.   HENT: Negative.   Eyes: Negative.   Respiratory: Negative.   Cardiovascular: Negative.   Gastrointestinal: Negative.   Endocrine: Negative.   Genitourinary: Negative.   Musculoskeletal: Negative.   Skin: Negative.   Allergic/Immunologic: Negative.   Neurological: Negative.   Hematological: Negative.   Psychiatric/Behavioral: Negative.   All other systems reviewed and are negative.  Past Medical History:  Diagnosis Date  . BACK PAIN, LUMBAR 10/18/2009  . Benign paroxysmal positional vertigo 09/30/2007  . CAD (coronary artery disease)   . Cancer [facility])    prostate   . CHF (congestive heart failure) ([place])   . CKD (chronic kidney disease)   . COLONIC POLYPS, ADENOMATOUS, HX OF   . GERD (gastroesophageal reflux disease)   . Herpes zoster with other nervous system complications(053.19)   . Hyperlipidemia   . Hypertension   . Hyperthyroidism    pt denies  . Loss of hearing    Bil/has hearing aids in both ears  . Nerve damage of left foot   .  PERIPHERAL NEUROPATHY 10/11/2007  . 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 PROSTATE CANCER, HX OF 07/07/2007   s/p prostatectomy  . RLS (restless legs syndrome)   . Ulcer   . ULCERATIVE COLITIS, LEFT SIDED 2000  . URTICARIA, ALLERGIC 08/05/2009  . VENOUS INSUFFICIENCY 07/28/2010    Social History   Socioeconomic  History  . Marital status: Married    Spouse name: Not on file  . Number of children: Not on file  . Years of education: Not on file  . Highest education level: Not on file  Occupational History  . Occupation: retired    Fish farm manager: RETIRED  . Occupation: [employer]  Social Needs  . Financial resource strain: Not on file  . Food insecurity:    Worry: Not on file    Inability: Not on file  . Transportation needs:    Medical: Not on file    Non-medical: Not on file  Tobacco Use  . Smoking status: Former Smoker    Types: Cigarettes    Last attempt to quit: 09/08/1975    Years since quitting: 42.9  . Smokeless tobacco: Never Used  Substance and Sexual Activity  . Alcohol use: Yes    Alcohol/week: 15.0 standard drinks    Types: 15 Standard drinks or equivalent per week    Comment: 1-2 "high balls" daily  . Drug use: No  . Sexual activity: Not on file  Lifestyle  . Physical activity:    Days per week: Not on file    Minutes per session: Not on file  . Stress: Not on file  Relationships  . Social connections:    Talks on phone: Not on file    Gets together: Not on file    Attends religious service: Not on file    Active member of club or organization: Not on file    Attends meetings of clubs or organizations: Not on file    Relationship status: Not on file  . Intimate partner violence:    Fear of current or ex partner: Not on file    Emotionally abused: Not on file    Physically abused: Not on file    Forced sexual activity: Not on file  Other Topics Concern  . 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 All live in [place] area   Works part time at [employer]    Past Surgical History:  Procedure Laterality Date  . ANGIOPLASTY  1990's/2004   stent placement 2  . COLONOSCOPY W/ BIOPSIES AND POLYPECTOMY  06/06/2009   adenomatous polyp, diverticulosis, colitis  .  INGUINAL HERNIA REPAIR     x x  . ORIF FIBULA FRACTURE Left 03/02/2017   Procedure: OPEN REDUCTION INTERNAL FIXATION (ORIF) LEFT DISTAL FIBULA FRACTURE WITH DELTOID LIGAMENT REPAIR;  Surgeon: [name], MD;  Location: [facility];  Service: Orthopedics;  Laterality: Left;  . PROSTATECTOMY    . RETINAL LASER PROCEDURE Right 2016 & 2017   . ROTATOR CUFF REPAIR Left   . TONSILLECTOMY      Family History  Problem Relation Age of Onset  . Heart attack Father   . Throat cancer Brother   . Multiple sclerosis Brother   . Multiple sclerosis Son   . Diabetes Maternal Uncle        x 2  . Diabetes Maternal Uncle   .  Colon cancer Neg Hx   . Stroke Neg Hx     Allergies  Allergen Reactions  . Cephalexin Hives    Current Outpatient Medications on File Prior to Visit  Medication Sig Dispense Refill  . aspirin 81 MG tablet Take 81 mg by mouth daily.      Marland Kitchen atorvastatin (LIPITOR) 40 MG tablet TAKE 1 TABLET DAILY 90 tablet 1  . carvedilol (COREG) 12.5 MG tablet TAKE 1 TABLET TWICE DAILY  WITH MEALS 180 tablet 0  . clopidogrel (PLAVIX) 75 MG tablet Take 75 mg by mouth daily.    . fenofibrate (TRICOR) 145 MG tablet TAKE 1 TABLET BY MOUTH EVERY DAY 90 tablet 3  . hydrochlorothiazide (HYDRODIURIL) 25 MG tablet Take 1 tablet (25 mg total) by mouth daily. 90 tablet 3  . isosorbide mononitrate (IMDUR) 60 MG 24 hr tablet Take 1 tablet (60 mg total) by mouth daily. 90 tablet 3  . meclizine (ANTIVERT) 12.5 MG tablet TAKE 1 TABLET (12.5 MG TOTAL) BY MOUTH 2 (TWO) TIMES DAILY AS NEEDED FOR DIZZINESS. 30 tablet 0  . Mesalamine 800 MG TBEC TAKE 2 TABLETS 3 TIMES A   DAY 540 tablet 1  . nitroGLYCERIN (NITROSTAT) 0.4 MG SL tablet Place 0.4 mg under the tongue every 5 (five) minutes as needed for chest pain.    . pantoprazole (PROTONIX) 40 MG tablet Take 1 tablet (40 mg total) by mouth daily. 30 tablet 0  . pantoprazole (PROTONIX) 40 MG tablet TAKE 1 TABLET DAILY 90 tablet 0  . pramipexole (MIRAPEX) 1 MG  tablet Take 1 tablet (1 mg total) by mouth at bedtime. 90 tablet 3  . pramipexole (MIRAPEX) 1 MG tablet TAKE 1 TABLET AT BEDTIME 90 tablet 1   Current Facility-Administered Medications on File Prior to Visit  Medication Dose Route Frequency Provider Last Rate Last Dose  . ipratropium-albuterol (DUONEB) 0.5-2.5 (3) MG/3ML nebulizer solution 3 mL  3 mL Nebulization Once Khaleel Beckom, NP        There were no vitals taken for this visit.      Objective:   Physical Exam  Constitutional: He is oriented to person, place, and time. He appears well-developed and well-nourished. No distress.  HENT:  Head: Normocephalic and atraumatic.  Right Ear: External ear normal.  Left Ear: External ear normal.  Nose: Nose normal.  Mouth/Throat: Oropharynx is clear and moist. No oropharyngeal exudate.  Eyes: Pupils are equal, round, and reactive to light. Conjunctivae and EOM are normal. Right eye exhibits no discharge. Left eye exhibits no discharge. No scleral icterus.  Neck: Normal range of motion. Neck supple. No JVD present. No tracheal deviation present. No thyromegaly present.  Cardiovascular: Normal rate, regular rhythm, normal heart sounds and intact distal pulses. Exam reveals no gallop and no friction rub.  No murmur heard. Pulmonary/Chest: Effort normal and breath sounds normal. No stridor. No respiratory distress. He has no wheezes. He has no rales. He exhibits no tenderness.  Silver dollar sized grandular tissue, centrally located under the nipple,  symmetrical in shape, non tender to palpation   Abdominal: Soft. Bowel sounds are normal. He exhibits no distension and no mass. There is no tenderness. There is no rebound and no guarding. No hernia.  Musculoskeletal: Normal range of motion. He exhibits no edema, tenderness or deformity.  Lymphadenopathy:    He has no cervical adenopathy.  Neurological: He is alert and oriented to person, place, and time. He displays normal reflexes. No cranial  nerve deficit or sensory deficit.  He exhibits normal muscle tone. Coordination normal.  Skin: Skin is warm and dry. Capillary refill takes less than 2 seconds. No rash noted. He is not diaphoretic. No erythema. No pallor.  Psychiatric: He has a normal mood and affect. His behavior is normal. Judgment and thought content normal.  Nursing note and vitals reviewed.     Assessment & Plan:  1. Routine general medical examination at a health care facility - Continue to stay active and exercise.  - Follow up in one year or sooner if needed - CBC with Differential/Platelet - Comprehensive metabolic panel - Lipid panel - TSH  2. Essential hypertension - Well controlled. No change in mediations  - No change in medication  - CBC with Differential/Platelet - Comprehensive metabolic panel - Lipid panel - TSH  3. Atherosclerosis of native coronary artery of native heart with angina pectoris ([place]) - Follow up with Cardiology as directed  - CBC with Differential/Platelet - Comprehensive metabolic panel - Lipid panel - TSH  4. Pure hypercholesterolemia - He has responded well to fenofibrate. Encouraged heart healthy diet  - CBC with Differential/Platelet - Comprehensive metabolic panel - Lipid panel - TSH  Dorothyann Peng, NP

## 2018-07-28 ENCOUNTER — Other Ambulatory Visit: Payer: Self-pay | Admitting: Family Medicine

## 2018-07-28 MED ORDER — ATORVASTATIN CALCIUM 80 MG PO TABS: 80.0000 mg | ORAL_TABLET | Freq: Every day | ORAL | 3 refills | Status: DC

## 2018-09-22 ENCOUNTER — Other Ambulatory Visit: Payer: Self-pay | Admitting: Nurse Practitioner

## 2018-09-22 DIAGNOSIS — E7800 Pure hypercholesterolemia, unspecified: Secondary | ICD-10-CM

## 2018-09-22 DIAGNOSIS — I10 Essential (primary) hypertension: Secondary | ICD-10-CM

## 2018-09-22 DIAGNOSIS — I2510 Atherosclerotic heart disease of native coronary artery without angina pectoris: Secondary | ICD-10-CM

## 2018-10-19 ENCOUNTER — Ambulatory Visit: Payer: Federal, State, Local not specified - PPO | Admitting: Nurse Practitioner

## 2018-11-02 ENCOUNTER — Ambulatory Visit: Payer: Federal, State, Local not specified - PPO | Admitting: Nurse Practitioner

## 2018-11-08 ENCOUNTER — Encounter: Payer: Self-pay | Admitting: Nurse Practitioner

## 2018-11-08 ENCOUNTER — Ambulatory Visit: Payer: Federal, State, Local not specified - PPO | Admitting: Nurse Practitioner

## 2018-11-08 VITALS — BP 126/82 | HR 76 | Ht 75.0 in | Wt 224.8 lb

## 2018-11-08 DIAGNOSIS — E7800 Pure hypercholesterolemia, unspecified: Secondary | ICD-10-CM

## 2018-11-08 DIAGNOSIS — I2510 Atherosclerotic heart disease of native coronary artery without angina pectoris: Secondary | ICD-10-CM | POA: Diagnosis not present

## 2018-11-08 DIAGNOSIS — I10 Essential (primary) hypertension: Secondary | ICD-10-CM | POA: Diagnosis not present

## 2018-11-08 MED ORDER — NITROGLYCERIN 0.4 MG SL SUBL: 0.4000 mg | SUBLINGUAL_TABLET | SUBLINGUAL | 6 refills | Status: DC | PRN

## 2018-11-08 NOTE — Progress Notes (Signed)
CARDIOLOGY OFFICE NOTE  Date:  11/08/2018    Kristopher Wilson Date of Birth: 04-18-1936 Medical Record #[id number]  PCP:  Dorothyann Peng, NP  Cardiologist:  Servando Snare    Chief Complaint  Patient presents with  . Coronary Artery Disease    6 month check    History of Present Illness: Kristopher Wilson is a 83 y.o. male who presents today for a 6 month check. He is a former patient of Dr. Claris Gladden - basically follows with me.  He has a history of CAD, ulcerative colitis, and shingles with post-herpetic neuralgia. Patient had PCI in 2000 with BMS to LAD and repeat PCI in 2009 with DES to PLV. ETT-myoview in 3/16 showed no evidence for ischemia or infarction. His activity has been chronicallylimited by a left leg neuropathy that has been thought to be due to post-herpetic neuralgia. He has been seen by neurology for this. He wears an orthotic on the left leg because of foot drop.He remains on aspirin and Plavix.   I have seen him multiple times over the past several years. He has had some intermittent chest pain - he has opted for medical management - he did have a Myoview updated in 2016 which was ok.We have had to adjust his medicines for his BP. Salt and alcohol continue to play a role. Last seen in August - he was felt to be doing ok.   Comes back today.[place].Doing well. Has been on some cruises - has another one planned - may be cancelling due to recent coronavirus. No chest pain. He had a spell back in January while in the [place] airport - had forgotten his NTG - he took some deep breaths and it went away - has not returned. He feels like he is doing ok. He has gained some weight but planning [place].  Breathing is good. Needs NTG refilled - not using but his is expired. He is fasting today but had his labs back in November - these are reviewed. He has no concerns today and feels like he is doing well.   Past Medical  History:  Diagnosis Date  . BACK PAIN, LUMBAR 10/18/2009  . Benign paroxysmal positional vertigo 09/30/2007  . CAD (coronary artery disease)   . Cancer [facility])    prostate   . CHF (congestive heart failure) ([place])   . CKD (chronic kidney disease)   . COLONIC POLYPS, ADENOMATOUS, HX OF   . GERD (gastroesophageal reflux disease)   . Herpes zoster with other nervous system complications(053.19)   . Hyperlipidemia   . Hypertension   . Hyperthyroidism    pt denies  . Loss of hearing    Bil/has hearing aids in both ears  . Nerve damage of left foot   . PERIPHERAL NEUROPATHY 10/11/2007  . 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 PROSTATE CANCER, HX OF 07/07/2007   s/p prostatectomy  . RLS (restless legs syndrome)   . Ulcer   . ULCERATIVE COLITIS, LEFT SIDED 2000  . URTICARIA, ALLERGIC 08/05/2009  . VENOUS INSUFFICIENCY 07/28/2010    Past Surgical History:  Procedure Laterality Date  . ANGIOPLASTY  1990's/2004   stent placement 2  . COLONOSCOPY W/ BIOPSIES AND POLYPECTOMY  06/06/2009   adenomatous polyp, diverticulosis, colitis  . INGUINAL HERNIA REPAIR     x x  . ORIF FIBULA FRACTURE Left 03/02/2017   Procedure: OPEN REDUCTION INTERNAL FIXATION (ORIF) LEFT DISTAL FIBULA FRACTURE WITH DELTOID  LIGAMENT REPAIR;  Surgeon: [name], MD;  Location: [place];  Service: Orthopedics;  Laterality: Left;  . PROSTATECTOMY    . RETINAL LASER PROCEDURE Right 2016 & 2017   . ROTATOR CUFF REPAIR Left   . TONSILLECTOMY       Medications: Current Meds  Medication Sig  . aspirin 81 MG tablet Take 81 mg by mouth daily.    Marland Kitchen atorvastatin (LIPITOR) 40 MG tablet Take 40 mg by mouth daily.  . carvedilol (COREG) 12.5 MG tablet TAKE 1 TABLET TWICE DAILY  WITH MEALS  . clopidogrel (PLAVIX) 75 MG tablet Take 75 mg by mouth daily.  . fenofibrate (TRICOR) 145 MG tablet TAKE 1 TABLET BY MOUTH EVERY DAY  . hydrochlorothiazide (HYDRODIURIL) 25 MG tablet TAKE 1 TABLET BY MOUTH EVERY DAY  . isosorbide  mononitrate (IMDUR) 60 MG 24 hr tablet Take 1 tablet (60 mg total) by mouth daily.  . meclizine (ANTIVERT) 12.5 MG tablet TAKE 1 TABLET (12.5 MG TOTAL) BY MOUTH 2 (TWO) TIMES DAILY AS NEEDED FOR DIZZINESS.  Marland Kitchen Mesalamine 800 MG TBEC TAKE 2 TABLETS 3 TIMES A   DAY  . nitroGLYCERIN (NITROSTAT) 0.4 MG SL tablet Place 1 tablet (0.4 mg total) under the tongue every 5 (five) minutes as needed for chest pain.  . pantoprazole (PROTONIX) 40 MG tablet TAKE 1 TABLET DAILY  . pramipexole (MIRAPEX) 1 MG tablet TAKE 1 TABLET AT BEDTIME  . [DISCONTINUED] nitroGLYCERIN (NITROSTAT) 0.4 MG SL tablet Place 0.4 mg under the tongue every 5 (five) minutes as needed for chest pain.   Current Facility-Administered Medications for the 11/08/18 encounter (Office Visit) with Burtis Junes, NP  Medication  . ipratropium-albuterol (DUONEB) 0.5-2.5 (3) MG/3ML nebulizer solution 3 mL     Allergies: Allergies  Allergen Reactions  . Cephalexin Hives    Social History: The patient  reports that he quit smoking about 43 years ago. His smoking use included cigarettes. He has never used smokeless tobacco. He reports current alcohol use of about 15.0 standard drinks of alcohol per week. He reports that he does not use drugs.   Family History: The patient's family history includes Diabetes in his maternal uncle and maternal uncle; Heart attack in his father; Multiple sclerosis in his brother and son; Throat cancer in his brother.   Review of Systems: Please see the history of present illness.   Otherwise, the review of systems is positive for none.   All other systems are reviewed and negative.   Physical Exam: VS:  BP 126/82 (BP Location: Left Arm, Patient Position: Sitting, Cuff Size: Normal)   Pulse 76   Ht 6\' 3"  (1.905 m)   Wt 224 lb 12.8 oz (102 kg)   SpO2 93% Comment: at rest  BMI 28.10 kg/m  .  BMI Body mass index is 28.1 kg/m.  Wt Readings from Last 3 Encounters:  11/08/18 224 lb 12.8 oz (102 kg)  07/26/18  222 lb (100.7 kg)  05/10/18 216 lb (98 kg)    General: Pleasant. Well developed, well nourished and in no acute distress. His weight is creeping up.   HEENT: Normal.  Neck: Supple, no JVD, carotid bruits, or masses noted.  Cardiac: Regular rate and rhythm. No murmurs, rubs, or gallops. No edema.  Respiratory:  Lungs are clear to auscultation bilaterally with normal work of breathing.  GI: Soft and nontender.  MS: No deformity or atrophy. Gait and ROM intact.  Skin: Warm and dry. Color is normal.  Neuro:  Strength and sensation are intact and no gross focal deficits noted.  Psych: Alert, appropriate and with normal affect.   LABORATORY DATA:  EKG:  EKG is not ordered today.  Lab Results  Component Value Date   WBC 5.8 07/26/2018   HGB 14.6 07/26/2018   HCT 42.9 07/26/2018   PLT 260.0 07/26/2018   GLUCOSE 117 (H) 07/26/2018   CHOL 190 07/26/2018   TRIG 243.0 (H) 07/26/2018   HDL 42.70 07/26/2018   LDLDIRECT 111.0 07/26/2018   LDLCALC 86 12/02/2017   ALT 28 07/26/2018   AST 22 07/26/2018   NA 138 07/26/2018   K 4.1 07/26/2018   CL 103 07/26/2018   CREATININE 1.28 07/26/2018   BUN 21 07/26/2018   CO2 26 07/26/2018   TSH 2.42 07/26/2018   PSA 0.14 02/18/2018   INR 0.9 02/29/2008   HGBA1C 5.7 10/11/2007     BNP (last 3 results) No results for input(s): BNP in the last 8760 hours.  ProBNP (last 3 results) No results for input(s): PROBNP in the last 8760 hours.   Other Studies Reviewed Today:  Myoview Impression from 11/2014 Exercise Capacity: Fair exercise capacity. BP Response: Hypertensive blood pressure response. Clinical Symptoms: No chest pain. ECG Impression: No significant ST segment change suggestive of ischemia. Comparison with Prior Nuclear Study: Previous scan is normal    Overall Impression: Normal stress nuclear study.  LV Ejection Fraction: 70%. LV Wall Motion: NL LV Function; NL Wall Motion   Dorris Carnes    Assessment/Plan:  1. CAD - has had remote PCI - last Myoview from 2016 was stable. He remains on chronic nitrate therapy. His symptoms remain stable. NTG is refilled today - he is not using. No changes made today. Encouraged him to continue with CV risk factor modification. He is planning on getting more active.   2. HTN -BP ok here today. No changes made.    3. HLD -labs from November noted - triglycerides are up - I suspect due to alcohol use. His weight is creeping back up as well.   Current medicines are reviewed with the patient today.  The patient does not have concerns regarding medicines other than what has been noted above.  The following changes have been made:  See above.  Labs/ tests ordered today include:   No orders of the defined types were placed in this encounter.    Disposition:   FU with me in 6 months.   Patient is agreeable to this plan and will call if any problems develop in the interim.   SignedTruitt Merle, NP  11/08/2018 8:22 AM  [facility] [address] [place], [place]  [address] Phone: [phone] Fax: [phone]

## 2018-11-08 NOTE — Patient Instructions (Addendum)
We will be checking the following labs today - NONE   Medication Instructions:    Continue with your current medicines.   I have refilled the NTG today.    If you need a refill on your cardiac medications before your next appointment, please call your pharmacy.     Testing/Procedures To Be Arranged:  N/A  Follow-Up:   See me back in 6 months.     At [facility], you and your health needs are our priority.  As part of our continuing mission to provide you with exceptional heart care, we have created designated Provider Care Teams.  These Care Teams include your primary Cardiologist (physician) and Advanced Practice Providers (APPs -  Physician Assistants and Nurse Practitioners) who all work together to provide you with the care you need, when you need it.  Special Instructions:  . None  Call the [place] office at [phone] if you have any questions, problems or concerns.

## 2018-11-10 ENCOUNTER — Encounter: Payer: Self-pay | Admitting: Internal Medicine

## 2018-12-12 ENCOUNTER — Ambulatory Visit: Payer: Federal, State, Local not specified - PPO | Admitting: Internal Medicine

## 2018-12-23 ENCOUNTER — Ambulatory Visit: Payer: Federal, State, Local not specified - PPO | Admitting: Internal Medicine

## 2019-01-03 ENCOUNTER — Ambulatory Visit (INDEPENDENT_AMBULATORY_CARE_PROVIDER_SITE_OTHER): Payer: Federal, State, Local not specified - PPO | Admitting: Internal Medicine

## 2019-01-03 ENCOUNTER — Encounter: Payer: Self-pay | Admitting: Internal Medicine

## 2019-01-03 ENCOUNTER — Other Ambulatory Visit: Payer: Self-pay

## 2019-01-03 DIAGNOSIS — K5150 Left sided colitis without complications: Secondary | ICD-10-CM | POA: Diagnosis not present

## 2019-01-03 DIAGNOSIS — Z86010 Personal history of colonic polyps: Secondary | ICD-10-CM | POA: Diagnosis not present

## 2019-01-03 NOTE — Progress Notes (Signed)
TELEHEALTH ENCOUNTER IN SETTING OF COVID-19 PANDEMIC - REQUESTED BY PATIENT SERVICE PROVIDED BY TELEMEDECINE - TYPE: Phone PATIENT LOCATION: Home PATIENT HAS CONSENTED TO TELEHEALTH VISIT PROVIDER LOCATION: OFFICE PARTICIPANTS OTHER THAN PATIENT: None TIME SPENT ON CALL:10 mins    Kristopher Wilson 83 y.o. 09/01/1936 [id number]  Assessment & Plan:   ULCERATIVE COLITIS, LEFT SIDED Doing well Continue mesalamine Have decided to stop surveillance colonoscopy He undrestands plan and risks of not doing so but we both agree sensible to investigate signs and sxs only as needed  COLONIC POLYPS, ADENOMATOUS, HX OF Have decided to stop surveillance He understands rationale and we both agree makes sense at his age and w/ his history overall of only a few lifetime diminutive adenomas   I appreciate the opportunity to care for this patient. CC: Dorothyann Peng, NP    Subjective:   Chief Complaint: History of colon polyps and left-sided ulcerative colitis, question continue colonoscopy  HPI This telehealth visit is to discuss whether or not to continue surveillance colonoscopy in a man with a history of 3 diminutive lifetime adenomas, last from the cecum/ileocecal valve area in 2016, and a history of left-sided ulcerative colitis that has been in remission for many years, on mesalamine.  He reports that he is doing well with occasional gas and an occasional extra stool a day but his GI symptoms are very stable.  No anemia or kidney dysfunction as of November 2019. Allergies  Allergen Reactions  . Cephalexin Hives   Current Meds  Medication Sig  . aspirin 81 MG tablet Take 81 mg by mouth daily.    Marland Kitchen atorvastatin (LIPITOR) 40 MG tablet Take 40 mg by mouth daily.  . carvedilol (COREG) 12.5 MG tablet TAKE 1 TABLET TWICE DAILY  WITH MEALS  . clopidogrel (PLAVIX) 75 MG tablet Take 75 mg by mouth daily.  . fenofibrate (TRICOR) 145 MG tablet TAKE 1 TABLET BY MOUTH EVERY DAY  .  hydrochlorothiazide (HYDRODIURIL) 25 MG tablet TAKE 1 TABLET BY MOUTH EVERY DAY  . isosorbide mononitrate (IMDUR) 60 MG 24 hr tablet Take 1 tablet (60 mg total) by mouth daily.  . meclizine (ANTIVERT) 12.5 MG tablet TAKE 1 TABLET (12.5 MG TOTAL) BY MOUTH 2 (TWO) TIMES DAILY AS NEEDED FOR DIZZINESS.  Marland Kitchen Mesalamine 800 MG TBEC TAKE 2 TABLETS 3 TIMES A   DAY  . nitroGLYCERIN (NITROSTAT) 0.4 MG SL tablet Place 1 tablet (0.4 mg total) under the tongue every 5 (five) minutes as needed for chest pain.  . pantoprazole (PROTONIX) 40 MG tablet TAKE 1 TABLET DAILY  . pramipexole (MIRAPEX) 1 MG tablet TAKE 1 TABLET AT BEDTIME   Current Facility-Administered Medications for the 01/03/19 encounter (Office Visit) with Gatha Mayer, MD  Medication  . ipratropium-albuterol (DUONEB) 0.5-2.5 (3) MG/3ML nebulizer solution 3 mL   Past Medical History:  Diagnosis Date  . BACK PAIN, LUMBAR 10/18/2009  . Benign paroxysmal positional vertigo 09/30/2007  . CAD (coronary artery disease)   . Cancer [facility])    prostate   . CHF (congestive heart failure) ([place])   . CKD (chronic kidney disease)   . COLONIC POLYPS, ADENOMATOUS, HX OF   . GERD (gastroesophageal reflux disease)   . Herpes zoster with other nervous system complications(053.19)   . Hyperlipidemia   . Hypertension   . Hyperthyroidism    pt denies  . Loss of hearing    Bil/has hearing aids in both ears  . Nerve damage of left foot   .  PERIPHERAL NEUROPATHY 10/11/2007  . 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 PROSTATE CANCER, HX OF 07/07/2007   s/p prostatectomy  . RLS (restless legs syndrome)   . Ulcer   . ULCERATIVE COLITIS, LEFT SIDED 2000  . URTICARIA, ALLERGIC 08/05/2009  . VENOUS INSUFFICIENCY 07/28/2010   Past Surgical History:  Procedure Laterality Date  . ANGIOPLASTY  1990's/2004   stent placement 2  . COLONOSCOPY W/ BIOPSIES AND POLYPECTOMY  06/06/2009   adenomatous polyp, diverticulosis, colitis  . INGUINAL HERNIA REPAIR     x x  . ORIF  FIBULA FRACTURE Left 03/02/2017   Procedure: OPEN REDUCTION INTERNAL FIXATION (ORIF) LEFT DISTAL FIBULA FRACTURE WITH DELTOID LIGAMENT REPAIR;  Surgeon: [name], MD;  Location: [facility];  Service: Orthopedics;  Laterality: Left;  . PROSTATECTOMY    . RETINAL LASER PROCEDURE Right 2016 & 2017   . ROTATOR CUFF REPAIR Left   . 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 All live in [place] area   Works part time at [employer]   family history includes Diabetes in his maternal uncle and maternal uncle; Heart attack in his father; Multiple sclerosis in his brother and son; Throat cancer in his brother.   Review of Systems As per HPI

## 2019-01-03 NOTE — Assessment & Plan Note (Signed)
Have decided to stop surveillance He understands rationale and we both agree makes sense at his age and w/ his history overall of only a few lifetime diminutive adenomas

## 2019-01-03 NOTE — Assessment & Plan Note (Signed)
Doing well Continue mesalamine Have decided to stop surveillance colonoscopy He undrestands plan and risks of not doing so but we both agree sensible to investigate signs and sxs only as needed

## 2019-01-03 NOTE — Patient Instructions (Signed)
Good to talk and hear that things are ok.  As we discussed no plans to do routine colonoscopy anymore.  You seem to be well and I recommend you stay on the mesalamine to keep the colitis under control.  I can see you as needed I think and you may get refills from Dorothyann Peng, NP  If you develop rectal bleeding, significant changes in bowel habits, unexplained anemia, or o other gastrointestinal problems please let me know and I will evaluate you.  I appreciate the opportunity to care for you. Gatha Mayer, MD, [facility]

## 2019-03-02 ENCOUNTER — Other Ambulatory Visit: Payer: Self-pay | Admitting: Nurse Practitioner

## 2019-04-07 ENCOUNTER — Other Ambulatory Visit: Payer: Self-pay | Admitting: Nurse Practitioner

## 2019-04-07 ENCOUNTER — Other Ambulatory Visit: Payer: Self-pay | Admitting: Adult Health

## 2019-04-11 NOTE — Telephone Encounter (Signed)
Sent to the pharmacy by e-scribe. 

## 2019-05-05 ENCOUNTER — Encounter: Payer: Self-pay | Admitting: Adult Health

## 2019-05-05 NOTE — Progress Notes (Deleted)
CARDIOLOGY OFFICE NOTE  Date:  05/05/2019    Kristopher Wilson Date of Birth: [birth year] Medical Record [id number]  PCP:  Dorothyann Peng, NP  Cardiologist:  Servando Snare & ***    No chief complaint on file.   History of Present Illness: Kristopher Wilson is a 83 y.o. male who presents today for a *** He is a former patient of Dr. Claris Gladden - basically follows with me.  He has a history of CAD, ulcerative colitis, and shingles with post-herpetic neuralgia. Patient had PCI in 2000 with BMS to LAD and repeat PCI in 2009 with DES to PLV. ETT-myoview in 3/16 showed no evidence for ischemia or infarction. His activity has been chronicallylimited by a left leg neuropathy that has been thought to be due to post-herpetic neuralgia. He has been seen by neurology for this. He wears an orthotic on the left leg because of foot drop.He remains on aspirin and Plavix.   I have seen him multiple times over the past several years. He has had some intermittent chest pain - he has opted for medical management - he did have a Myoview updated in 2016 which was ok.We have had to adjust his medicines for his BP. Salt and alcohol continue to play a role. Last seen in August - he was felt to be doing ok.   Comes back today.Herealone.Doing well. Has been on some cruises - has another one planned - may be cancelling due to recent coronavirus. No chest pain. He had a spell back in January while in the [place] airport - had forgotten his NTG - he took some deep breaths and it went away - has not returned. He feels like he is doing ok. He has gained some weight but planning on walking more of [place] as the weather improves.  Breathing is good. Needs NTG refilled - not using but his is expired. He is fasting today but had his labs back in November - these are reviewed. He has no concerns today and feels like he is doing well.   The patient {does/does not:200015} have symptoms concerning for COVID-19  infection (fever, chills, cough, or new shortness of breath).   Comes in today. Here with   Past Medical History:  Diagnosis Date  . BACK PAIN, LUMBAR 10/18/2009  . Benign paroxysmal positional vertigo 09/30/2007  . CAD (coronary artery disease)   . Cancer [facility])    prostate   . CHF (congestive heart failure) ([place])   . CKD (chronic kidney disease)   . COLONIC POLYPS, ADENOMATOUS, HX OF   . GERD (gastroesophageal reflux disease)   . Herpes zoster with other nervous system complications(053.19)   . Hyperlipidemia   . Hypertension   . Hyperthyroidism    pt denies  . Loss of hearing    Bil/has hearing aids in both ears  . Nerve damage of left foot   . PERIPHERAL NEUROPATHY 10/11/2007  . 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 PROSTATE CANCER, HX OF 07/07/2007   s/p prostatectomy  . RLS (restless legs syndrome)   . Ulcer   . ULCERATIVE COLITIS, LEFT SIDED 2000  . URTICARIA, ALLERGIC 08/05/2009  . VENOUS INSUFFICIENCY 07/28/2010    Past Surgical History:  Procedure Laterality Date  . ANGIOPLASTY  1990's/2004   stent placement 2  . COLONOSCOPY W/ BIOPSIES AND POLYPECTOMY  06/06/2009   adenomatous polyp, diverticulosis, colitis  . INGUINAL HERNIA REPAIR     x x  . ORIF FIBULA FRACTURE Left 03/02/2017  Procedure: OPEN REDUCTION INTERNAL FIXATION (ORIF) LEFT DISTAL FIBULA FRACTURE WITH DELTOID LIGAMENT REPAIR;  Surgeon: [name], MD;  Location: [facility];  Service: Orthopedics;  Laterality: Left;  . PROSTATECTOMY    . RETINAL LASER PROCEDURE Right 2016 & 2017   . ROTATOR CUFF REPAIR Left   . TONSILLECTOMY       Medications: No outpatient medications have been marked as taking for the 05/10/19 encounter (Appointment) with Burtis Junes, NP.   Current Facility-Administered Medications for the 05/10/19 encounter (Appointment) with Burtis Junes, NP  Medication  . ipratropium-albuterol (DUONEB) 0.5-2.5 (3) MG/3ML nebulizer solution 3 mL     Allergies: Allergies   Allergen Reactions  . Cephalexin Hives    Social History: The patient  reports that he quit smoking about 43 years ago. His smoking use included cigarettes. He has never used smokeless tobacco. He reports current alcohol use of about 15.0 standard drinks of alcohol per week. He reports that he does not use drugs.   Family History: The patient's ***family history includes Diabetes in his maternal uncle and maternal uncle; Heart attack in his father; Multiple sclerosis in his brother and son; Throat cancer in his brother.   Review of Systems: Please see the history of present illness.   All other systems are reviewed and negative.   Physical Exam: VS:  There were no vitals taken for this visit. Marland Kitchen  BMI There is no height or weight on file to calculate BMI.  Wt Readings from Last 3 Encounters:  01/03/19 224 lb (101.6 kg)  11/08/18 224 lb 12.8 oz (102 kg)  07/26/18 222 lb (100.7 kg)    General: Pleasant. Well developed, well nourished and in no acute distress.   HEENT: Normal.  Neck: Supple, no JVD, carotid bruits, or masses noted.  Cardiac: ***Regular rate and rhythm. No murmurs, rubs, or gallops. No edema.  Respiratory:  Lungs are clear to auscultation bilaterally with normal work of breathing.  GI: Soft and nontender.  MS: No deformity or atrophy. Gait and ROM intact.  Skin: Warm and dry. Color is normal.  Neuro:  Strength and sensation are intact and no gross focal deficits noted.  Psych: Alert, appropriate and with normal affect.   LABORATORY DATA:  EKG:  EKG {ACTION; IS/IS GI:[id number] ordered today. This demonstrates ***.  Lab Results  Component Value Date   WBC 5.8 07/26/2018   HGB 14.6 07/26/2018   HCT 42.9 07/26/2018   PLT 260.0 07/26/2018   GLUCOSE 117 (H) 07/26/2018   CHOL 190 07/26/2018   TRIG 243.0 (H) 07/26/2018   HDL 42.70 07/26/2018   LDLDIRECT 111.0 07/26/2018   LDLCALC 86 12/02/2017   ALT 28 07/26/2018   AST 22 07/26/2018   NA 138 07/26/2018   K  4.1 07/26/2018   CL 103 07/26/2018   CREATININE 1.28 07/26/2018   BUN 21 07/26/2018   CO2 26 07/26/2018   TSH 2.42 07/26/2018   PSA 0.14 02/18/2018   INR 0.9 02/29/2008   HGBA1C 5.7 10/11/2007     BNP (last 3 results) No results for input(s): BNP in the last 8760 hours.  ProBNP (last 3 results) No results for input(s): PROBNP in the last 8760 hours.   Other Studies Reviewed Today:   Assessment/Plan: Myoview Impression from 11/2014 Exercise Capacity: Fair exercise capacity. BP Response: Hypertensive blood pressure response. Clinical Symptoms: No chest pain. ECG Impression: No significant ST segment change suggestive of ischemia. Comparison with Prior Nuclear Study: Previous scan is normal  Overall Impression: Normal stress nuclear study.  LV Ejection Fraction: 70%. LV Wall Motion: NL LV Function; NL Wall Motion   Kristopher Wilson    Assessment/Plan:  1. CAD - has had remote PCI - last Myoview from 2016 was stable. He remains on chronic nitrate therapy. His symptoms remain stable. NTG is refilled today - he is not using. No changes made today. Encouraged him to continue with CV risk factor modification. He is planning on getting more active.   2. HTN -BP ok here today. No changes made.   3. HLD -labs from November noted - triglycerides are up - I suspect due to alcohol use. His weight is creeping back up as well.   Marland Kitchen COVID-19 Education: The signs and symptoms of COVID-19 were discussed with the patient and how to seek care for testing (follow up with PCP or arrange E-visit).  The importance of social distancing, staying at home, hand hygiene and wearing a mask when out in public were discussed today.  Current medicines are reviewed with the patient today.  The patient does not have concerns regarding medicines other than what has been noted above.  The following changes have been made:  See above.  Labs/ tests ordered today include:   No orders  of the defined types were placed in this encounter.    Disposition:   FU with *** in {gen number [id number] {Days to years:10300}.   Patient is agreeable to this plan and will call if any problems develop in the interim.   SignedTruitt Merle, NP  05/05/2019 7:28 AM  [facility] [address] [place], [place]  [address] Phone: [phone] Fax: [phone]

## 2019-05-10 ENCOUNTER — Ambulatory Visit: Payer: Federal, State, Local not specified - PPO | Admitting: Nurse Practitioner

## 2019-05-16 ENCOUNTER — Encounter: Payer: Self-pay | Admitting: Adult Health

## 2019-05-17 ENCOUNTER — Encounter: Payer: Self-pay | Admitting: Family Medicine

## 2019-05-17 ENCOUNTER — Telehealth: Payer: Self-pay | Admitting: Family Medicine

## 2019-05-17 NOTE — Telephone Encounter (Signed)
Received paper work for PA on [employer].  Form completed and faxed back to [phone].  Phone # given was [phone].  Received confirmation the fax transmission was successful.

## 2019-05-29 ENCOUNTER — Encounter: Payer: Self-pay | Admitting: Adult Health

## 2019-05-30 MED ORDER — PANTOPRAZOLE SODIUM 40 MG PO TBEC: 40.0000 mg | DELAYED_RELEASE_TABLET | Freq: Every day | ORAL | 0 refills | Status: DC

## 2019-05-30 NOTE — Telephone Encounter (Addendum)
Approved from 04/20/2019 to 05/19/2020.

## 2019-06-02 NOTE — Progress Notes (Signed)
CARDIOLOGY OFFICE NOTE  Date:  06/05/2019    Kristopher Wilson Date of Birth: 08-11-1936 Medical Record [id number]  PCP:  Dorothyann Peng, NP  Cardiologist:  Servando Snare   Chief Complaint  Patient presents with  . Follow-up    History of Present Illness: Kristopher Wilson is a 83 y.o. male who presents today for a 6 month check. He is a former patient of Dr. Claris Gladden - basically follows with me.  He has a history of CAD, ulcerative colitis, and shingles with post-herpetic neuralgia. Patient had PCI in 2000 with BMS to LAD and repeat PCI in 2009 with DES to PLV. ETT-myoview in 3/16 showed no evidence for ischemia or infarction. His activity has been chronicallylimited by a left leg neuropathy that has been thought to be due to post-herpetic neuralgia. He has been seen by neurology for this. He wears an orthotic on the left leg because of foot drop.He remains on aspirin and Plavix long term.   I have seen him multiple times over the past several years. He has had some intermittent chest pain - he has opted for medical management - he did have a Myoview updated in 2016 which was ok.We have had to adjust his medicines for his BP. Salt and alcohol continue to play a role. Last seen in March - overall doing well - enjoying lots of travels. Had had a spell earlier in the year in the [place] airport with chest pain - had forgotten his NTG - relieved with deep breaths - had not recurred. Otherwise, felt to be doing well.   The patient does not have symptoms concerning for COVID-19 infection (fever, chills, cough, or new shortness of breath).   Comes in today. Here alone. Doing well. Has lost weight. His cruise to [place] was cancelled. No chest pain since that episode in the [place] airport. Not short of breath. Enjoying golf and doing more walking. He is eating less. He feels like he is doing well overall.   Past Medical History:  Diagnosis Date  . BACK PAIN, LUMBAR 10/18/2009  . Benign  paroxysmal positional vertigo 09/30/2007  . CAD (coronary artery disease)   . Cancer [facility])    prostate   . CHF (congestive heart failure) ([place])   . CKD (chronic kidney disease)   . COLONIC POLYPS, ADENOMATOUS, HX OF   . GERD (gastroesophageal reflux disease)   . Herpes zoster with other nervous system complications(053.19)   . Hyperlipidemia   . Hypertension   . Hyperthyroidism    pt denies  . Loss of hearing    Bil/has hearing aids in both ears  . Nerve damage of left foot   . PERIPHERAL NEUROPATHY 10/11/2007  . 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 PROSTATE CANCER, HX OF 07/07/2007   s/p prostatectomy  . RLS (restless legs syndrome)   . Ulcer   . ULCERATIVE COLITIS, LEFT SIDED 2000  . URTICARIA, ALLERGIC 08/05/2009  . VENOUS INSUFFICIENCY 07/28/2010    Past Surgical History:  Procedure Laterality Date  . ANGIOPLASTY  1990's/2004   stent placement 2  . COLONOSCOPY W/ BIOPSIES AND POLYPECTOMY  06/06/2009   adenomatous polyp, diverticulosis, colitis  . INGUINAL HERNIA REPAIR     x x  . ORIF FIBULA FRACTURE Left 03/02/2017   Procedure: OPEN REDUCTION INTERNAL FIXATION (ORIF) LEFT DISTAL FIBULA FRACTURE WITH DELTOID LIGAMENT REPAIR;  Surgeon: [name], MD;  Location: [facility];  Service: Orthopedics;  Laterality: Left;  . PROSTATECTOMY    .  RETINAL LASER PROCEDURE Right 2016 & 2017   . ROTATOR CUFF REPAIR Left   . TONSILLECTOMY       Medications: Current Meds  Medication Sig  . aspirin 81 MG tablet Take 81 mg by mouth daily.    Marland Kitchen atorvastatin (LIPITOR) 40 MG tablet Take 40 mg by mouth daily.  . carvedilol (COREG) 12.5 MG tablet Take 12.5 mg by mouth daily.  . clopidogrel (PLAVIX) 75 MG tablet TAKE 1 TABLET DAILY  . fenofibrate (TRICOR) 145 MG tablet TAKE 1 TABLET BY MOUTH EVERY DAY  . hydrochlorothiazide (HYDRODIURIL) 25 MG tablet TAKE 1 TABLET BY MOUTH EVERY DAY  . isosorbide mononitrate (IMDUR) 60 MG 24 hr tablet TAKE 1 TABLET BY MOUTH EVERY DAY  . meclizine  (ANTIVERT) 12.5 MG tablet TAKE 1 TABLET (12.5 MG TOTAL) BY MOUTH 2 (TWO) TIMES DAILY AS NEEDED FOR DIZZINESS. (Patient taking differently: Take 12.5 mg by mouth 2 (two) times daily as needed for dizziness. )  . Mesalamine 800 MG TBEC Take 3 tablets by mouth 2 (two) times daily.  . nitroGLYCERIN (NITROSTAT) 0.4 MG SL tablet Place 1 tablet (0.4 mg total) under the tongue every 5 (five) minutes as needed for chest pain. (Patient taking differently: Place 0.4 mg under the tongue every 5 (five) minutes as needed for chest pain. )  . pantoprazole (PROTONIX) 40 MG tablet Take 1 tablet (40 mg total) by mouth daily.  . pramipexole (MIRAPEX) 1 MG tablet TAKE 1 TABLET AT BEDTIME   Current Facility-Administered Medications for the 06/05/19 encounter (Office Visit) with Burtis Junes, NP  Medication  . ipratropium-albuterol (DUONEB) 0.5-2.5 (3) MG/3ML nebulizer solution 3 mL     Allergies: Allergies  Allergen Reactions  . Cephalexin Hives    Social History: The patient  reports that he quit smoking about 43 years ago. His smoking use included cigarettes. He has never used smokeless tobacco. He reports current alcohol use of about 15.0 standard drinks of alcohol per week. He reports that he does not use drugs.   Family History: The patient's family history includes Diabetes in his maternal uncle and maternal uncle; Heart attack in his father; Multiple sclerosis in his brother and son; Throat cancer in his brother.   Review of Systems: Please see the history of present illness.   All other systems are reviewed and negative.   Physical Exam: VS:  BP 130/88   Pulse 73   Ht 6\' 3"  (1.905 m)   Wt 213 lb (96.6 kg)   BMI 26.62 kg/m  .  BMI Body mass index is 26.62 kg/m.  Wt Readings from Last 3 Encounters:  06/05/19 213 lb (96.6 kg)  01/03/19 224 lb (101.6 kg)  11/08/18 224 lb 12.8 oz (102 kg)    General: Pleasant. Well developed, well nourished and in no acute distress.  His weight is down 11  pounds.  HEENT: Normal.  Neck: Supple, no JVD, carotid bruits, or masses noted.  Cardiac: Regular rate and rhythm. No murmurs, rubs, or gallops. No edema.  Respiratory:  Lungs are clear to auscultation bilaterally with normal work of breathing.  GI: Soft and nontender.  MS: No deformity or atrophy. Gait and ROM intact.  Skin: Warm and dry. Color is normal.  Neuro:  Strength and sensation are intact and no gross focal deficits noted.  Psych: Alert, appropriate and with normal affect.   LABORATORY DATA:  EKG:  EKG is ordered today. This demonstrates NSR.  Lab Results  Component Value Date  WBC 5.8 07/26/2018   HGB 14.6 07/26/2018   HCT 42.9 07/26/2018   PLT 260.0 07/26/2018   GLUCOSE 117 (H) 07/26/2018   CHOL 190 07/26/2018   TRIG 243.0 (H) 07/26/2018   HDL 42.70 07/26/2018   LDLDIRECT 111.0 07/26/2018   LDLCALC 86 12/02/2017   ALT 28 07/26/2018   AST 22 07/26/2018   NA 138 07/26/2018   K 4.1 07/26/2018   CL 103 07/26/2018   CREATININE 1.28 07/26/2018   BUN 21 07/26/2018   CO2 26 07/26/2018   TSH 2.42 07/26/2018   PSA 0.14 02/18/2018   INR 0.9 02/29/2008   HGBA1C 5.7 10/11/2007     BNP (last 3 results) No results for input(s): BNP in the last 8760 hours.  ProBNP (last 3 results) No results for input(s): PROBNP in the last 8760 hours.   Other Studies Reviewed Today:  Myoview Impression from 11/2014 Exercise Capacity: Fair exercise capacity. BP Response: Hypertensive blood pressure response. Clinical Symptoms: No chest pain. ECG Impression: No significant ST segment change suggestive of ischemia. Comparison with Prior Nuclear Study: Previous scan is normal    Overall Impression: Normal stress nuclear study.  LV Ejection Fraction: 70%. LV Wall Motion: NL LV Function; NL Wall Motion   Dorris Carnes    Assessment/Plan:  1. CAD with remote PCI - low risk Myoview from 2016 and we have continued with medical management for chest pain/angina. He  remains on chronic nitrate therapy along with DAPT. Continue with CV risk factor modification. Doing well clinically.    2. HTN - BP is great - no changes made today.   3. HLD - on statin - lab today.   4. COVID-19 Education: The signs and symptoms of COVID-19 were discussed with the patient and how to seek care for testing (follow up with PCP or arrange E-visit).  The importance of social distancing, staying at home, hand hygiene and wearing a mask when out in public were discussed today.  Current medicines are reviewed with the patient today.  The patient does not have concerns regarding medicines other than what has been noted above.  The following changes have been made:  See above.  Labs/ tests ordered today include:    Orders Placed This Encounter  Procedures  . Basic metabolic panel  . CBC  . Hepatic function panel  . Lipid panel  . EKG 12-Lead     Disposition:   FU with me in 6 months. Recheck lab on return.    Patient is agreeable to this plan and will call if any problems develop in the interim.   SignedTruitt Merle, NP  06/05/2019 8:29 AM  [facility] [address] [place], [place]  [address] Phone: [phone] Fax: [phone]

## 2019-06-05 ENCOUNTER — Ambulatory Visit: Payer: Federal, State, Local not specified - PPO | Admitting: Nurse Practitioner

## 2019-06-05 ENCOUNTER — Other Ambulatory Visit: Payer: Self-pay

## 2019-06-05 ENCOUNTER — Encounter: Payer: Self-pay | Admitting: Nurse Practitioner

## 2019-06-05 VITALS — BP 130/88 | HR 73 | Ht 75.0 in | Wt 213.0 lb

## 2019-06-05 DIAGNOSIS — E7800 Pure hypercholesterolemia, unspecified: Secondary | ICD-10-CM

## 2019-06-05 DIAGNOSIS — Z7189 Other specified counseling: Secondary | ICD-10-CM

## 2019-06-05 DIAGNOSIS — I10 Essential (primary) hypertension: Secondary | ICD-10-CM

## 2019-06-05 DIAGNOSIS — I2510 Atherosclerotic heart disease of native coronary artery without angina pectoris: Secondary | ICD-10-CM | POA: Diagnosis not present

## 2019-06-05 LAB — CBC
Hematocrit: 43 % (ref 37.5–51.0)
Hemoglobin: 15 g/dL (ref 13.0–17.7)
MCH: 30 pg (ref 26.6–33.0)
MCHC: 34.9 g/dL (ref 31.5–35.7)
MCV: 86 fL (ref 79–97)
Platelets: 286 10*3/uL (ref 150–450)
RBC: 5 x10E6/uL (ref 4.14–5.80)
RDW: 13 % (ref 11.6–15.4)
WBC: 6.2 10*3/uL (ref 3.4–10.8)

## 2019-06-05 LAB — BASIC METABOLIC PANEL
BUN/Creatinine Ratio: 15 (ref 10–24)
BUN: 22 mg/dL (ref 8–27)
CO2: 23 mmol/L (ref 20–29)
Calcium: 9.6 mg/dL (ref 8.6–10.2)
Chloride: 98 mmol/L (ref 96–106)
Creatinine, Ser: 1.45 mg/dL — ABNORMAL HIGH (ref 0.76–1.27)
GFR calc Af Amer: 52 mL/min/{1.73_m2} — ABNORMAL LOW (ref >59)
GFR calc non Af Amer: 45 mL/min/{1.73_m2} — ABNORMAL LOW (ref >59)
Glucose: 115 mg/dL — ABNORMAL HIGH (ref 65–99)
Potassium: 4.2 mmol/L (ref 3.5–5.2)
Sodium: 137 mmol/L (ref 134–144)

## 2019-06-05 LAB — LIPID PANEL
Chol/HDL Ratio: 4 ratio (ref 0.0–5.0)
Cholesterol, Total: 177 mg/dL (ref 100–199)
HDL: 44 mg/dL (ref >39)
LDL Chol Calc (NIH): 104 mg/dL — ABNORMAL HIGH (ref 0–99)
Triglycerides: 163 mg/dL — ABNORMAL HIGH (ref 0–149)
VLDL Cholesterol Cal: 29 mg/dL (ref 5–40)

## 2019-06-05 LAB — HEPATIC FUNCTION PANEL
ALT: 33 IU/L (ref 0–44)
AST: 29 IU/L (ref 0–40)
Albumin: 4.6 g/dL (ref 3.6–4.6)
Alkaline Phosphatase: 55 IU/L (ref 39–117)
Bilirubin Total: 0.7 mg/dL (ref 0.0–1.2)
Bilirubin, Direct: 0.22 mg/dL (ref 0.00–0.40)
Total Protein: 6.6 g/dL (ref 6.0–8.5)

## 2019-06-05 NOTE — Patient Instructions (Addendum)
After Visit Summary:  We will be checking the following labs today - BMET, CBC, HPF and Lipids   Medication Instructions:    Continue with your current medicines.    If you need a refill on your cardiac medications before your next appointment, please call your pharmacy.     Testing/Procedures To Be Arranged:  N/A  Follow-Up:   See me in 6 months    At [facility], you and your health needs are our priority.  As part of our continuing mission to provide you with exceptional heart care, we have created designated Provider Care Teams.  These Care Teams include your primary Cardiologist (physician) and Advanced Practice Providers (APPs -  Physician Assistants and Nurse Practitioners) who all work together to provide you with the care you need, when you need it.  Special Instructions:  . Stay safe, stay home, wash your hands for at least 20 seconds and wear a mask when out in public.  . It was good to talk with you today.    Call the [place] office at [phone] if you have any questions, problems or concerns.

## 2019-06-07 ENCOUNTER — Other Ambulatory Visit: Payer: Self-pay | Admitting: Adult Health

## 2019-06-12 DIAGNOSIS — Z9889 Other specified postprocedural states: Secondary | ICD-10-CM

## 2019-06-12 HISTORY — DX: Other specified postprocedural states: Z98.890

## 2019-06-17 ENCOUNTER — Other Ambulatory Visit: Payer: Self-pay | Admitting: Nurse Practitioner

## 2019-06-17 DIAGNOSIS — I10 Essential (primary) hypertension: Secondary | ICD-10-CM

## 2019-06-17 DIAGNOSIS — E7800 Pure hypercholesterolemia, unspecified: Secondary | ICD-10-CM

## 2019-06-17 DIAGNOSIS — I2510 Atherosclerotic heart disease of native coronary artery without angina pectoris: Secondary | ICD-10-CM

## 2019-06-19 ENCOUNTER — Other Ambulatory Visit: Payer: Self-pay | Admitting: Adult Health

## 2019-06-21 NOTE — Telephone Encounter (Signed)
Protonix sent to the pharmacy for 90 days.  Pt due for cpx in Nov.  Mesalamine denied as it should be sent to Dr. Carlean Purl.

## 2019-06-21 NOTE — Telephone Encounter (Signed)
We are filling Protonix but it looks like GI is filling mesalamine

## 2019-06-22 ENCOUNTER — Encounter: Payer: Self-pay | Admitting: Family Medicine

## 2019-06-22 ENCOUNTER — Other Ambulatory Visit: Payer: Self-pay

## 2019-06-22 MED ORDER — MESALAMINE 800 MG PO TBEC: 3.0000 | DELAYED_RELEASE_TABLET | Freq: Two times a day (BID) | ORAL | 5 refills | Status: DC

## 2019-06-22 NOTE — Telephone Encounter (Signed)
Patient sent request for Mesamamine to be refilled. This has been done to his mail order. His last office note said to continue.

## 2019-07-18 ENCOUNTER — Encounter: Payer: Self-pay | Admitting: Adult Health

## 2019-07-18 DIAGNOSIS — H8113 Benign paroxysmal vertigo, bilateral: Secondary | ICD-10-CM

## 2019-07-19 MED ORDER — MECLIZINE HCL 12.5 MG PO TABS: 12.5000 mg | ORAL_TABLET | Freq: Two times a day (BID) | ORAL | 0 refills | Status: DC | PRN

## 2019-07-28 ENCOUNTER — Other Ambulatory Visit: Payer: Self-pay | Admitting: Nurse Practitioner

## 2019-07-28 ENCOUNTER — Other Ambulatory Visit: Payer: Self-pay | Admitting: Adult Health

## 2019-07-28 DIAGNOSIS — H8113 Benign paroxysmal vertigo, bilateral: Secondary | ICD-10-CM

## 2019-07-31 NOTE — Telephone Encounter (Signed)
Pantoprazole sent to [facility] in Oct.

## 2019-08-31 ENCOUNTER — Other Ambulatory Visit: Payer: Self-pay | Admitting: Nurse Practitioner

## 2019-08-31 ENCOUNTER — Ambulatory Visit
Admission: EM | Admit: 2019-08-31 | Discharge: 2019-08-31 | Disposition: A | Discharge status: Home / Self Care | Payer: Federal, State, Local not specified - PPO | Attending: Emergency Medicine | Admitting: Emergency Medicine

## 2019-08-31 ENCOUNTER — Encounter (HOSPITAL_COMMUNITY): Payer: Self-pay | Admitting: Emergency Medicine

## 2019-08-31 ENCOUNTER — Ambulatory Visit (INDEPENDENT_AMBULATORY_CARE_PROVIDER_SITE_OTHER): Payer: Federal, State, Local not specified - PPO

## 2019-08-31 ENCOUNTER — Other Ambulatory Visit: Payer: Self-pay

## 2019-08-31 ENCOUNTER — Encounter: Payer: Self-pay | Admitting: Emergency Medicine

## 2019-08-31 ENCOUNTER — Emergency Department (HOSPITAL_COMMUNITY)
Admission: EM | Admit: 2019-08-31 | Discharge: 2019-08-31 | Disposition: A | Discharge status: Home / Self Care | Payer: Federal, State, Local not specified - PPO | Attending: Emergency Medicine | Admitting: Emergency Medicine

## 2019-08-31 DIAGNOSIS — R0789 Other chest pain: Secondary | ICD-10-CM | POA: Diagnosis not present

## 2019-08-31 DIAGNOSIS — Z87891 Personal history of nicotine dependence: Secondary | ICD-10-CM | POA: Insufficient documentation

## 2019-08-31 DIAGNOSIS — R6889 Other general symptoms and signs: Secondary | ICD-10-CM

## 2019-08-31 DIAGNOSIS — I2510 Atherosclerotic heart disease of native coronary artery without angina pectoris: Secondary | ICD-10-CM | POA: Diagnosis not present

## 2019-08-31 DIAGNOSIS — Z7982 Long term (current) use of aspirin: Secondary | ICD-10-CM | POA: Insufficient documentation

## 2019-08-31 DIAGNOSIS — I509 Heart failure, unspecified: Secondary | ICD-10-CM | POA: Insufficient documentation

## 2019-08-31 DIAGNOSIS — Z79899 Other long term (current) drug therapy: Secondary | ICD-10-CM | POA: Insufficient documentation

## 2019-08-31 DIAGNOSIS — E860 Dehydration: Secondary | ICD-10-CM | POA: Insufficient documentation

## 2019-08-31 DIAGNOSIS — N189 Chronic kidney disease, unspecified: Secondary | ICD-10-CM | POA: Insufficient documentation

## 2019-08-31 DIAGNOSIS — J9811 Atelectasis: Secondary | ICD-10-CM

## 2019-08-31 DIAGNOSIS — R079 Chest pain, unspecified: Secondary | ICD-10-CM

## 2019-08-31 DIAGNOSIS — R0600 Dyspnea, unspecified: Secondary | ICD-10-CM | POA: Diagnosis not present

## 2019-08-31 DIAGNOSIS — I959 Hypotension, unspecified: Secondary | ICD-10-CM

## 2019-08-31 DIAGNOSIS — I130 Hypertensive heart and chronic kidney disease with heart failure and stage 1 through stage 4 chronic kidney disease, or unspecified chronic kidney disease: Secondary | ICD-10-CM | POA: Insufficient documentation

## 2019-08-31 DIAGNOSIS — M791 Myalgia, unspecified site: Secondary | ICD-10-CM

## 2019-08-31 DIAGNOSIS — Z20828 Contact with and (suspected) exposure to other viral communicable diseases: Secondary | ICD-10-CM

## 2019-08-31 DIAGNOSIS — J029 Acute pharyngitis, unspecified: Secondary | ICD-10-CM | POA: Diagnosis present

## 2019-08-31 LAB — POC SARS CORONAVIRUS 2 AG -  ED: SARS Coronavirus 2 Ag: NEGATIVE

## 2019-08-31 LAB — I-STAT CHEM 8, ED
BUN: 21 mg/dL (ref 8–23)
Calcium, Ion: 1.17 mmol/L (ref 1.15–1.40)
Chloride: 103 mmol/L (ref 98–111)
Creatinine, Ser: 1.3 mg/dL — ABNORMAL HIGH (ref 0.61–1.24)
Glucose, Bld: 105 mg/dL — ABNORMAL HIGH (ref 70–99)
HCT: 40 % (ref 39.0–52.0)
Hemoglobin: 13.6 g/dL (ref 13.0–17.0)
Potassium: 4 mmol/L (ref 3.5–5.1)
Sodium: 139 mmol/L (ref 135–145)
TCO2: 27 mmol/L (ref 22–32)

## 2019-08-31 LAB — POCT FASTING CBG KUC MANUAL ENTRY: POCT Glucose (KUC): 128 mg/dL — AB (ref 70–99)

## 2019-08-31 LAB — CBG MONITORING, ED: Glucose-Capillary: 115 mg/dL — ABNORMAL HIGH (ref 70–99)

## 2019-08-31 MED ORDER — SODIUM CHLORIDE 0.9 % IV BOLUS
1000.0000 mL | Freq: Once | INTRAVENOUS | Status: AC
  Administered 2019-08-31: 1000 mL via INTRAVENOUS

## 2019-08-31 NOTE — Discharge Instructions (Addendum)
83 year old male with history of CHF, CAD, prostate cancer status post prostatectomy, former tobacco use presenting for sudden onset of myalgias, chest tightness, dyspnea since this morning.  Patient found to be pale, mildly diaphoretic upon presentation with worsening diaphoresis throughout visit.  Rapid Covid done: Negative.  EKG stable from previous.  Chest x-ray showing left basilar atelectasis without infiltrates, consolidation.  Patient's blood pressure dropped from initial 121/71-99/63.  CBG 128.  SPO2 varying between 89-93% through most of visit.  Patient placed on 2 L [place] with O2 saturations improving up to 97%.  Due to lack of significant findings in diagnostics performed in office today given patient's clinical appearance and comorbidities, patient was referred to ER for further evaluation/management.

## 2019-08-31 NOTE — ED Triage Notes (Signed)
Pt presents to [facility] for 1 day of nasal congestion, sore throat, chest tightness with ambulation, and body aches.  Denies fever, denies n/v/d.

## 2019-08-31 NOTE — ED Notes (Signed)
This RN and APP note patient to becoming more diaphoretic and pale while ambulating to chest xray.  Upon return to room repeat vitals done with drop in BP, patient's chest diaphoretic during EKG.  Patient c/o "cold sweat" but denies any other changes in symptoms at this time.  Denies SOB, denies CP.

## 2019-08-31 NOTE — ED Provider Notes (Signed)
[facility]    CSN: [id number] Arrival date & time: 08/31/19  1248      History   Chief Complaint Chief Complaint  Patient presents with  . Flu-Like Symptoms    HPI Kristopher Wilson is a 83 y.o. male with history of hypertension, CAD, CHF, prostate cancer (status post prostatectomy) presenting for chest tightness, dyspnea since this morning.  Patient states chest tightness is "uncomfortable "and constant.  Nonradiating.  Patient denies cough, wheezing.  Patient does have remote history of tobacco use.  Has not tried any thing for symptoms.  Does admit to myalgias, URI symptoms since yesterday.  Reports good oral intake without vomiting or diarrhea.   Past Medical History:  Diagnosis Date  . BACK PAIN, LUMBAR 10/18/2009  . Benign paroxysmal positional vertigo 09/30/2007  . CAD (coronary artery disease)   . Cancer [facility])    prostate   . CHF (congestive heart failure) ([place])   . CKD (chronic kidney disease)   . COLONIC POLYPS, ADENOMATOUS, HX OF   . GERD (gastroesophageal reflux disease)   . Herpes zoster with other nervous system complications(053.19)   . Hyperlipidemia   . Hypertension   . Hyperthyroidism    pt denies  . Loss of hearing    Bil/has hearing aids in both ears  . Nerve damage of left foot   . PERIPHERAL NEUROPATHY 10/11/2007  . 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 PROSTATE CANCER, HX OF 07/07/2007   s/p prostatectomy  . RLS (restless legs syndrome)   . S/P skin biopsy 06/12/2019  . Ulcer   . ULCERATIVE COLITIS, LEFT SIDED 2000  . URTICARIA, ALLERGIC 08/05/2009  . VENOUS INSUFFICIENCY 07/28/2010    Patient Active Problem List   Diagnosis Date Noted  . Displaced fracture of distal end of left fibula 03/02/2017  . Tear of deltoid ligament of ankle 03/02/2017  . Routine general medical examination at a health care facility 02/06/2016  . Lumbosacral radiculopathy due to herpes zoster 12/10/2015  . Neck pain on right side 07/22/2015  . Long term  current use of antithrombotics/antiplatelets - clopidogrel and ASA 03/28/2015  . Left shoulder pain 05/31/2014  . Restless leg syndrome 11/14/2013  . Night sweats 09/12/2013  . Hyperlipidemia 12/18/2010  . Pain in the chest 08/11/2010  . VENOUS INSUFFICIENCY 07/28/2010  . 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 ULCERATIVE COLITIS, LEFT SIDED 12/27/2009  . COLONIC POLYPS, ADENOMATOUS, HX OF 12/27/2009  . BACK PAIN, LUMBAR 10/18/2009  . GERD 04/18/2008  . BENIGN PAROXYSMAL POSITIONAL VERTIGO 09/30/2007  . Essential hypertension 07/07/2007  . Coronary atherosclerosis 07/07/2007  . PROSTATE CANCER, HX OF 07/07/2007    Past Surgical History:  Procedure Laterality Date  . ANGIOPLASTY  1990's/2004   stent placement 2  . COLONOSCOPY W/ BIOPSIES AND POLYPECTOMY  06/06/2009   adenomatous polyp, diverticulosis, colitis  . INGUINAL HERNIA REPAIR     x x  . ORIF FIBULA FRACTURE Left 03/02/2017   Procedure: OPEN REDUCTION INTERNAL FIXATION (ORIF) LEFT DISTAL FIBULA FRACTURE WITH DELTOID LIGAMENT REPAIR;  Surgeon: [name], MD;  Location: [place];  Service: Orthopedics;  Laterality: Left;  . PROSTATECTOMY    . RETINAL LASER PROCEDURE Right 2016 & 2017   . ROTATOR CUFF REPAIR Left   . TONSILLECTOMY         Home Medications    Prior to Admission medications   Medication Sig Start Date End Date Taking? Authorizing Provider  aspirin 81 MG tablet Take 81 mg by mouth  daily.      [provider]  atorvastatin (LIPITOR) 40 MG tablet Take 40 mg by mouth daily.    [provider]  carvedilol (COREG) 12.5 MG tablet Take 12.5 mg by mouth daily.    [provider]  clopidogrel (PLAVIX) 75 MG tablet TAKE 1 TABLET DAILY Patient taking differently: Take 75 mg by mouth daily.  08/31/19   Burtis Junes, NP  fenofibrate (TRICOR) 145 MG tablet TAKE 1 TABLET BY MOUTH EVERY DAY Patient taking differently: Take 145 mg by mouth daily.  07/28/19   Burtis Junes, NP   hydrochlorothiazide (HYDRODIURIL) 25 MG tablet TAKE 1 TABLET BY MOUTH EVERY DAY Patient taking differently: Take 25 mg by mouth daily.  06/19/19   Burtis Junes, NP  isosorbide mononitrate (IMDUR) 60 MG 24 hr tablet TAKE 1 TABLET BY MOUTH EVERY DAY Patient taking differently: Take 60 mg by mouth daily.  03/02/19   Burtis Junes, NP  meclizine (ANTIVERT) 12.5 MG tablet TAKE 1 TABLET (12.5 MG TOTAL) BY MOUTH 2 (TWO) TIMES DAILY AS NEEDED FOR DIZZINESS. 07/31/19   Nafziger, Tommi Rumps, NP  Mesalamine 800 MG TBEC Take 3 tablets (2,400 mg total) by mouth 2 (two) times daily. 06/22/19   Gatha Mayer, MD  nitroGLYCERIN (NITROSTAT) 0.4 MG SL tablet Place 1 tablet (0.4 mg total) under the tongue every 5 (five) minutes as needed for chest pain. 11/08/18   Burtis Junes, NP  pantoprazole (PROTONIX) 40 MG tablet TAKE 1 TABLET DAILY Patient taking differently: Take 40 mg by mouth daily.  06/21/19   Nafziger, Tommi Rumps, NP  pramipexole (MIRAPEX) 1 MG tablet TAKE 1 TABLET AT BEDTIME Patient taking differently: Take 1 mg by mouth at bedtime.  05/31/18   Dorothyann Peng, NP    Family History Family History  Problem Relation Age of Onset  . Heart attack Father   . Throat cancer Brother   . Multiple sclerosis Brother   . Multiple sclerosis Son   . Diabetes Maternal Uncle        x 2  . Diabetes Maternal Uncle   . Colon cancer Neg Hx   . Stroke Neg Hx     Social History Social History   Tobacco Use  . Smoking status: Former Smoker    Types: Cigarettes    Quit date: 09/08/1975    Years since quitting: 44.0  . Smokeless tobacco: Never Used  Substance Use Topics  . Alcohol use: Yes    Alcohol/week: 15.0 standard drinks    Types: 15 Standard drinks or equivalent per week    Comment: 1-2 "high balls" daily  . Drug use: No     Allergies   Cephalexin   Review of Systems Review of Systems  Constitutional: Negative for fatigue and fever.  HENT: Positive for congestion and rhinorrhea. Negative for  dental problem, ear pain, facial swelling, hearing loss, sinus pain, sore throat, trouble swallowing and voice change.   Eyes: Negative for photophobia, pain and visual disturbance.  Respiratory: Positive for chest tightness and shortness of breath. Negative for cough, choking and wheezing.   Cardiovascular: Positive for chest pain. Negative for palpitations and leg swelling.  Gastrointestinal: Negative for abdominal pain, diarrhea and vomiting.  Musculoskeletal: Negative for arthralgias and myalgias.  Skin: Negative for rash and wound.  Neurological: Negative for dizziness, speech difficulty and headaches.  All other systems reviewed and are negative.    Physical Exam Triage Vital Signs ED Triage Vitals  Enc Vitals Group  BP 08/31/19 1259 121/71     Pulse Rate 08/31/19 1259 67     Resp 08/31/19 1259 18     Temp 08/31/19 1259 98.5 F (36.9 C)     Temp Source 08/31/19 1259 Temporal     SpO2 08/31/19 1259 95 %     Weight --      Height --      Head Circumference --      Peak Flow --      Pain Score 08/31/19 1300 4     Pain Loc --      Pain Edu? --      Excl. in [place]? --    No data found.  Updated Vital Signs BP 99/63 (BP Location: Left Arm)   Pulse 60   Temp (!) 97.4 F (36.3 C) (Oral)   Resp 16   SpO2 95%   Visual Acuity Right Eye Distance:   Left Eye Distance:   Bilateral Distance:    Right Eye Near:   Left Eye Near:    Bilateral Near:     Physical Exam Constitutional:      General: He is not in acute distress.    Appearance: He is normal weight. He is ill-appearing and diaphoretic.     Comments: Patient appears pale  HENT:     Head: Normocephalic and atraumatic.     Mouth/Throat:     Mouth: Mucous membranes are moist.     Pharynx: Oropharynx is clear.  Eyes:     General: No scleral icterus.    Conjunctiva/sclera: Conjunctivae normal.     Pupils: Pupils are equal, round, and reactive to light.  Neck:     Comments: Trachea midline, negative  JVD Cardiovascular:     Rate and Rhythm: Normal rate and regular rhythm.  Pulmonary:     Effort: Pulmonary effort is normal. No respiratory distress.     Breath sounds: No stridor. No wheezing, rhonchi or rales.     Comments: Decreased breath sounds in left upper lung field.  No prolonged expiratory phase bilaterally Abdominal:     General: Abdomen is flat.     Tenderness: There is no abdominal tenderness.  Musculoskeletal:     Right lower leg: No edema.     Left lower leg: No edema.  Skin:    Capillary Refill: Capillary refill takes 2 to 3 seconds.     Coloration: Skin is not jaundiced or pale.  Neurological:     General: No focal deficit present.     Mental Status: He is alert and oriented to person, place, and time.  Psychiatric:        Behavior: Behavior normal.        Thought Content: Thought content normal.      UC Treatments / Results  Labs (all labs ordered are listed, but only abnormal results are displayed) Labs Reviewed  POCT FASTING CBG [facility] MANUAL ENTRY - Abnormal; Notable for the following components:      Result Value   POCT Glucose ([facility]) 128 (*)    All other components within normal limits  NOVEL CORONAVIRUS, NAA  POC SARS CORONAVIRUS 2 AG -  ED    EKG   Radiology DG Chest 2 View  Result Date: 08/31/2019 CLINICAL DATA:  Chest tightness EXAM: CHEST - 2 VIEW COMPARISON:  March 02, 2017 FINDINGS: There is minimal atelectatic change in the left base. There is no edema or consolidation. Heart size and pulmonary vascularity are normal. No adenopathy. There is  aortic atherosclerosis. No pneumothorax. No bone lesions. IMPRESSION: Slight left base atelectasis. No edema or consolidation. Cardiac silhouette within normal limits. Aortic Atherosclerosis (ICD10-I70.0). Electronically Signed   By: Lowella Grip III M.D.   On: 08/31/2019 13:25    Procedures Procedures (including critical care time)  Medications Ordered in UC Medications - No data to  display  Initial Impression / Assessment and Plan / UC Course  I have reviewed the triage vital signs and the nursing notes.  Pertinent labs & imaging results that were available during my care of the patient were reviewed by me and considered in my medical decision making (see chart for details).     EKG in office, reviewed by me and compared to previous from September 2020: Normal sinus rhythm with ventricular rate of 62 bpm.  No QTC prolongation, waveform abnormalities as compared to previous-overall stable. Please see discharge instructions for additional MDM.  All diagnostic tests including imaging was reviewed by me at time of UC visit. Final Clinical Impressions(s) / UC Diagnoses   Final diagnoses:  Flu-like symptoms  Hypotension, unspecified hypotension type  Chest pain, unspecified type     Discharge Instructions     83 year old male with history of CHF, CAD, prostate cancer status post prostatectomy, former tobacco use presenting for sudden onset of myalgias, chest tightness, dyspnea since this morning.  Patient found to be pale, mildly diaphoretic upon presentation with worsening diaphoresis throughout visit.  Rapid Covid done: Negative.  EKG stable from previous.  Chest x-ray showing left basilar atelectasis without infiltrates, consolidation.  Patient's blood pressure dropped from initial 121/71-99/63.  CBG 128.  SPO2 varying between 89-93% through most of visit.  Patient placed on 2 L [place] with O2 saturations improving up to 97%.  Due to lack of significant findings in diagnostics performed in office today given patient's clinical appearance and comorbidities, patient was referred to ER for further evaluation/management.    ED Prescriptions    None     PDMP not reviewed this encounter.   [place], [place], [place] 08/31/19 1520

## 2019-08-31 NOTE — ED Triage Notes (Signed)
Pt went to UC for scratchy throat, congestion, runny nose since yesterday morning. Rapid POC Covid was negative. Pt became diaphoretic, pale, and hypotension, so EMS was called for pt to have further evaluation at ED.  98% on RA with EMS, 132/76. UC placed a 20g IV in right AC.

## 2019-08-31 NOTE — ED Provider Notes (Signed)
[facility] DEPT Provider Note   CSN: [id number] Arrival date & time: 08/31/19  1437     History Chief Complaint  Patient presents with  . Hypotension    Kristopher Wilson is a 83 y.o. male.  Patient was seen today at the urgent care for sore throat and weakness.  They did a Covid test which was negative and chest x-ray was unremarkable.  Patient became weak and dizzy and hypotensive so he was sent to the emergency department.  The history is provided by the patient. No language interpreter was used.  Weakness Severity:  Moderate Onset quality:  Sudden Timing:  Constant Progression:  Improving Chronicity:  New Context: not alcohol use   Relieved by:  Nothing Worsened by:  Nothing Ineffective treatments:  None tried Associated symptoms: no abdominal pain, no chest pain, no cough, no diarrhea, no frequency, no headaches and no seizures        Past Medical History:  Diagnosis Date  . BACK PAIN, LUMBAR 10/18/2009  . Benign paroxysmal positional vertigo 09/30/2007  . CAD (coronary artery disease)   . Cancer [facility])    prostate   . CHF (congestive heart failure) ([place])   . CKD (chronic kidney disease)   . COLONIC POLYPS, ADENOMATOUS, HX OF   . GERD (gastroesophageal reflux disease)   . Herpes zoster with other nervous system complications(053.19)   . Hyperlipidemia   . Hypertension   . Hyperthyroidism    pt denies  . Loss of hearing    Bil/has hearing aids in both ears  . Nerve damage of left foot   . PERIPHERAL NEUROPATHY 10/11/2007  . 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 PROSTATE CANCER, HX OF 07/07/2007   s/p prostatectomy  . RLS (restless legs syndrome)   . S/P skin biopsy 06/12/2019  . Ulcer   . ULCERATIVE COLITIS, LEFT SIDED 2000  . URTICARIA, ALLERGIC 08/05/2009  . VENOUS INSUFFICIENCY 07/28/2010    Patient Active Problem List   Diagnosis Date Noted  . Displaced fracture of distal end of left fibula 03/02/2017  . Tear of  deltoid ligament of ankle 03/02/2017  . Routine general medical examination at a health care facility 02/06/2016  . Lumbosacral radiculopathy due to herpes zoster 12/10/2015  . Neck pain on right side 07/22/2015  . Long term current use of antithrombotics/antiplatelets - clopidogrel and ASA 03/28/2015  . Left shoulder pain 05/31/2014  . Restless leg syndrome 11/14/2013  . Night sweats 09/12/2013  . Hyperlipidemia 12/18/2010  . Pain in the chest 08/11/2010  . VENOUS INSUFFICIENCY 07/28/2010  . 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 ULCERATIVE COLITIS, LEFT SIDED 12/27/2009  . COLONIC POLYPS, ADENOMATOUS, HX OF 12/27/2009  . BACK PAIN, LUMBAR 10/18/2009  . GERD 04/18/2008  . BENIGN PAROXYSMAL POSITIONAL VERTIGO 09/30/2007  . Essential hypertension 07/07/2007  . Coronary atherosclerosis 07/07/2007  . PROSTATE CANCER, HX OF 07/07/2007    Past Surgical History:  Procedure Laterality Date  . ANGIOPLASTY  1990's/2004   stent placement 2  . COLONOSCOPY W/ BIOPSIES AND POLYPECTOMY  06/06/2009   adenomatous polyp, diverticulosis, colitis  . INGUINAL HERNIA REPAIR     x x  . ORIF FIBULA FRACTURE Left 03/02/2017   Procedure: OPEN REDUCTION INTERNAL FIXATION (ORIF) LEFT DISTAL FIBULA FRACTURE WITH DELTOID LIGAMENT REPAIR;  Surgeon: [name], MD;  Location: [facility];  Service: Orthopedics;  Laterality: Left;  . PROSTATECTOMY    . RETINAL LASER PROCEDURE Right 2016 & 2017   . ROTATOR CUFF  REPAIR Left   . TONSILLECTOMY         Family History  Problem Relation Age of Onset  . Heart attack Father   . Throat cancer Brother   . Multiple sclerosis Brother   . Multiple sclerosis Son   . Diabetes Maternal Uncle        x 2  . Diabetes Maternal Uncle   . Colon cancer Neg Hx   . Stroke Neg Hx     Social History   Tobacco Use  . Smoking status: Former Smoker    Types: Cigarettes    Quit date: 09/08/1975    Years since quitting: 44.0  . Smokeless tobacco: Never Used    Substance Use Topics  . Alcohol use: Yes    Alcohol/week: 15.0 standard drinks    Types: 15 Standard drinks or equivalent per week    Comment: 1-2 "high balls" daily  . Drug use: No    Home Medications Prior to Admission medications   Medication Sig Start Date End Date Taking? Authorizing Provider  aspirin 81 MG tablet Take 81 mg by mouth daily.     Yes [provider]  atorvastatin (LIPITOR) 40 MG tablet Take 40 mg by mouth daily.   Yes [provider]  carvedilol (COREG) 12.5 MG tablet Take 12.5 mg by mouth daily.   Yes [provider]  clopidogrel (PLAVIX) 75 MG tablet TAKE 1 TABLET DAILY Patient taking differently: Take 75 mg by mouth daily.  08/31/19  Yes Burtis Junes, NP  fenofibrate (TRICOR) 145 MG tablet TAKE 1 TABLET BY MOUTH EVERY DAY Patient taking differently: Take 145 mg by mouth daily.  07/28/19  Yes Burtis Junes, NP  hydrochlorothiazide (HYDRODIURIL) 25 MG tablet TAKE 1 TABLET BY MOUTH EVERY DAY Patient taking differently: Take 25 mg by mouth daily.  06/19/19  Yes Burtis Junes, NP  isosorbide mononitrate (IMDUR) 60 MG 24 hr tablet TAKE 1 TABLET BY MOUTH EVERY DAY Patient taking differently: Take 60 mg by mouth daily.  03/02/19  Yes Burtis Junes, NP  meclizine (ANTIVERT) 12.5 MG tablet TAKE 1 TABLET (12.5 MG TOTAL) BY MOUTH 2 (TWO) TIMES DAILY AS NEEDED FOR DIZZINESS. 07/31/19  Yes Nafziger, Tommi Rumps, NP  Mesalamine 800 MG TBEC Take 3 tablets (2,400 mg total) by mouth 2 (two) times daily. 06/22/19  Yes Gatha Mayer, MD  nitroGLYCERIN (NITROSTAT) 0.4 MG SL tablet Place 1 tablet (0.4 mg total) under the tongue every 5 (five) minutes as needed for chest pain. 11/08/18  Yes Burtis Junes, NP  pantoprazole (PROTONIX) 40 MG tablet TAKE 1 TABLET DAILY Patient taking differently: Take 40 mg by mouth daily.  06/21/19  Yes Nafziger, Tommi Rumps, NP  pramipexole (MIRAPEX) 1 MG tablet TAKE 1 TABLET AT BEDTIME Patient taking differently: Take 1 mg by  mouth at bedtime.  05/31/18  Yes Nafziger, Tommi Rumps, NP    Allergies    Cephalexin  Review of Systems   Review of Systems  Constitutional: Positive for fatigue. Negative for appetite change.  HENT: Positive for congestion. Negative for ear discharge and sinus pressure.        Sore throat  Eyes: Negative for discharge.  Respiratory: Negative for cough.   Cardiovascular: Negative for chest pain.  Gastrointestinal: Negative for abdominal pain and diarrhea.  Genitourinary: Negative for frequency and hematuria.  Musculoskeletal: Negative for back pain.  Skin: Negative for rash.  Neurological: Positive for weakness. Negative for seizures and headaches.  Psychiatric/Behavioral: Negative for hallucinations.  Physical Exam Updated Vital Signs BP 126/72 (BP Location: Left Arm)   Pulse (!) 55   Resp 10   SpO2 96%   Physical Exam Vitals and nursing note reviewed.  Constitutional:      Appearance: He is well-developed.  HENT:     Head: Normocephalic.     Nose: Nose normal.  Eyes:     General: No scleral icterus.    Conjunctiva/sclera: Conjunctivae normal.  Neck:     Thyroid: No thyromegaly.  Cardiovascular:     Rate and Rhythm: Normal rate and regular rhythm.     Heart sounds: No murmur. No friction rub. No gallop.   Pulmonary:     Breath sounds: No stridor. No wheezing or rales.  Chest:     Chest wall: No tenderness.  Abdominal:     General: There is no distension.     Tenderness: There is no abdominal tenderness. There is no rebound.  Musculoskeletal:        General: Normal range of motion.     Cervical back: Neck supple.  Lymphadenopathy:     Cervical: No cervical adenopathy.  Skin:    Findings: No erythema or rash.  Neurological:     Mental Status: He is alert and oriented to person, place, and time.     Motor: No abnormal muscle tone.     Coordination: Coordination normal.  Psychiatric:        Behavior: Behavior normal.     ED Results / Procedures / Treatments    Labs (all labs ordered are listed, but only abnormal results are displayed) Labs Reviewed  CBG MONITORING, ED - Abnormal; Notable for the following components:      Result Value   Glucose-Capillary 115 (*)    All other components within normal limits  I-STAT CHEM 8, ED - Abnormal; Notable for the following components:   Creatinine, Ser 1.30 (*)    Glucose, Bld 105 (*)    All other components within normal limits    EKG None  Radiology DG Chest 2 View  Result Date: 08/31/2019 CLINICAL DATA:  Chest tightness EXAM: CHEST - 2 VIEW COMPARISON:  March 02, 2017 FINDINGS: There is minimal atelectatic change in the left base. There is no edema or consolidation. Heart size and pulmonary vascularity are normal. No adenopathy. There is aortic atherosclerosis. No pneumothorax. No bone lesions. IMPRESSION: Slight left base atelectasis. No edema or consolidation. Cardiac silhouette within normal limits. Aortic Atherosclerosis (ICD10-I70.0). Electronically Signed   By: Lowella Grip III M.D.   On: 08/31/2019 13:25    Procedures Procedures (including critical care time)  Medications Ordered in ED Medications  sodium chloride 0.9 % bolus 1,000 mL (1,000 mLs Intravenous New Bag/Given (Non-Interop) 08/31/19 1611)    ED Course  I have reviewed the triage vital signs and the nursing notes.  Pertinent labs & imaging results that were available during my care of the patient were reviewed by me and considered in my medical decision making (see chart for details).    MDM Rules/Calculators/A&P                      Patient given IV fluids and feels much better.  Labs unremarkable.  Patient has a second Covid test pending at the urgent care.  He will be discharged home to follow-up with them Final Clinical Impression(s) / ED Diagnoses Final diagnoses:  Dehydration    Rx / DC Orders ED Discharge Orders    None  Milton Ferguson, MD 08/31/19 [phone]

## 2019-08-31 NOTE — ED Notes (Signed)
Called for non-emergency traffic (Per APP) for pickup of patient.

## 2019-08-31 NOTE — ED Notes (Signed)
EMS on site for pickup

## 2019-08-31 NOTE — Discharge Instructions (Addendum)
Follow-up with your doctor next week for recheck

## 2019-09-01 LAB — NOVEL CORONAVIRUS, NAA: SARS-CoV-2, NAA: NOT DETECTED

## 2019-09-17 ENCOUNTER — Other Ambulatory Visit: Payer: Self-pay

## 2019-09-17 ENCOUNTER — Ambulatory Visit: Payer: Federal, State, Local not specified - PPO | Attending: Internal Medicine

## 2019-09-17 DIAGNOSIS — Z23 Encounter for immunization: Secondary | ICD-10-CM

## 2019-09-17 NOTE — Progress Notes (Signed)
   Covid-19 Vaccination Clinic  Name:  Kristopher Wilson    MRN: [id number] DOB: 1936/06/05  09/17/2019  [name] was observed post Covid-19 immunization for 30 minutes based on pre-vaccination screening without incidence. He was provided with Vaccine Information Sheet and instruction to access the V-Safe system.   [name] was instructed to call 911 with any severe reactions post vaccine: Marland Kitchen Difficulty breathing  . Swelling of your face and throat  . A fast heartbeat  . A bad rash all over your body  . Dizziness and weakness    Immunizations Administered    Name Date Dose VIS Date Route   Pfizer COVID-19 Vaccine 09/17/2019 12:01 PM 0.3 mL 08/18/2019 Intramuscular   Manufacturer: [employer]   Lot: [id number]   [place]: [id number]

## 2019-09-28 ENCOUNTER — Ambulatory Visit (INDEPENDENT_AMBULATORY_CARE_PROVIDER_SITE_OTHER): Payer: Federal, State, Local not specified - PPO | Admitting: Adult Health

## 2019-09-28 ENCOUNTER — Encounter: Payer: Self-pay | Admitting: Adult Health

## 2019-09-28 ENCOUNTER — Other Ambulatory Visit: Payer: Self-pay

## 2019-09-28 VITALS — BP 140/90 | HR 66 | Temp 98.2°F | Ht 73.75 in | Wt 220.0 lb

## 2019-09-28 DIAGNOSIS — G2581 Restless legs syndrome: Secondary | ICD-10-CM

## 2019-09-28 DIAGNOSIS — Z8546 Personal history of malignant neoplasm of prostate: Secondary | ICD-10-CM

## 2019-09-28 DIAGNOSIS — I25119 Atherosclerotic heart disease of native coronary artery with unspecified angina pectoris: Secondary | ICD-10-CM | POA: Diagnosis not present

## 2019-09-28 DIAGNOSIS — Z0000 Encounter for general adult medical examination without abnormal findings: Secondary | ICD-10-CM

## 2019-09-28 DIAGNOSIS — E7800 Pure hypercholesterolemia, unspecified: Secondary | ICD-10-CM

## 2019-09-28 DIAGNOSIS — I10 Essential (primary) hypertension: Secondary | ICD-10-CM

## 2019-09-28 DIAGNOSIS — K219 Gastro-esophageal reflux disease without esophagitis: Secondary | ICD-10-CM | POA: Diagnosis not present

## 2019-09-28 DIAGNOSIS — K5150 Left sided colitis without complications: Secondary | ICD-10-CM

## 2019-09-28 LAB — CBC WITH DIFFERENTIAL/PLATELET
Basophils Absolute: 0 10*3/uL (ref 0.0–0.1)
Basophils Relative: 0.2 % (ref 0.0–3.0)
Eosinophils Absolute: 0.3 10*3/uL (ref 0.0–0.7)
Eosinophils Relative: 6.6 % — ABNORMAL HIGH (ref 0.0–5.0)
HCT: 40 % (ref 39.0–52.0)
Hemoglobin: 13.6 g/dL (ref 13.0–17.0)
Lymphocytes Relative: 38.1 % (ref 12.0–46.0)
Lymphs Abs: 1.9 10*3/uL (ref 0.7–4.0)
MCHC: 34 g/dL (ref 30.0–36.0)
MCV: 87 fl (ref 78.0–100.0)
Monocytes Absolute: 0.4 10*3/uL (ref 0.1–1.0)
Monocytes Relative: 7.6 % (ref 3.0–12.0)
Neutro Abs: 2.4 10*3/uL (ref 1.4–7.7)
Neutrophils Relative %: 47.5 % (ref 43.0–77.0)
Platelets: 233 10*3/uL (ref 150.0–400.0)
RBC: 4.6 Mil/uL (ref 4.22–5.81)
RDW: 13.8 % (ref 11.5–15.5)
WBC: 5 10*3/uL (ref 4.0–10.5)

## 2019-09-28 LAB — COMPREHENSIVE METABOLIC PANEL
ALT: 25 U/L (ref 0–53)
AST: 16 U/L (ref 0–37)
Albumin: 4.3 g/dL (ref 3.5–5.2)
Alkaline Phosphatase: 62 U/L (ref 39–117)
BUN: 17 mg/dL (ref 6–23)
CO2: 27 mEq/L (ref 19–32)
Calcium: 9.4 mg/dL (ref 8.4–10.5)
Chloride: 104 mEq/L (ref 96–112)
Creatinine, Ser: 1.27 mg/dL (ref 0.40–1.50)
GFR: 54.38 mL/min — ABNORMAL LOW (ref >60.00)
Glucose, Bld: 99 mg/dL (ref 70–99)
Potassium: 3.9 mEq/L (ref 3.5–5.1)
Sodium: 137 mEq/L (ref 135–145)
Total Bilirubin: 0.6 mg/dL (ref 0.2–1.2)
Total Protein: 6.2 g/dL (ref 6.0–8.3)

## 2019-09-28 LAB — LDL CHOLESTEROL, DIRECT: Direct LDL: 95 mg/dL

## 2019-09-28 LAB — LIPID PANEL
Cholesterol: 194 mg/dL (ref 0–200)
HDL: 42 mg/dL (ref >39.00)
NonHDL: 152.4
Total CHOL/HDL Ratio: 5
Triglycerides: 293 mg/dL — ABNORMAL HIGH (ref 0.0–149.0)
VLDL: 58.6 mg/dL — ABNORMAL HIGH (ref 0.0–40.0)

## 2019-09-28 LAB — TSH: TSH: 1.95 u[IU]/mL (ref 0.35–4.50)

## 2019-09-28 NOTE — Progress Notes (Signed)
Subjective:    Patient ID: Kristopher Wilson, male    DOB: Apr 19, 1936, 84 y.o.   MRN: [id number]  HPI Patient presents for yearly preventative medicine examination. 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  Hypertension -takes Coreg 12.5 mg twice daily and HCTZ 25 mg.  He denies dizziness, lightheadedness, or chest pain BP Readings from Last 3 Encounters:  09/28/19 140/90  08/31/19 137/75  08/31/19 99/63   Hyperlipidemia -currently prescribed Lipitor 40 mg, fenofibrate, and daily baby aspirin.  He denies myalgia or fatigue Lab Results  Component Value Date   CHOL 177 06/05/2019   HDL 44 06/05/2019   LDLCALC 104 (H) 06/05/2019   LDLDIRECT 111.0 07/26/2018   TRIG 163 (H) 06/05/2019   CHOLHDL 4.0 06/05/2019   CAD -had PCI in 2000 with BMS to LAD and repeat PSI in 2009 with DES to PLV.  Seen by cardiology.  On chronic nitrate therapy, aspirin, and Plavix.  He denies chest pain or shortness of breath  H/o Prostate Cancer -is followed by urology 2 times a year.  History of prostatectomy  GERD - Well controlled on Protonix   H/O ulcerative colitis-is followed by gastroenterology.  Currently prescribed mesalamine and doing well.  Restless Leg Syndrome -currently  prescribed Mirapex 1 mg nightly.  Feels as though is well controlled  All immunizations and health maintenance protocols were reviewed with the patient and needed orders were placed. He is up to date on routine vaccinations. He had his first Covid vaccination.   Appropriate screening laboratory values were ordered for the patient including screening of hyperlipidemia, renal function and hepatic function.  Medication reconciliation,  past medical history, social history, problem list and allergies were reviewed in detail with the patient  Goals were established with regard to weight loss, exercise, and  diet in compliance with medications. He is golfing and trying to eat healthy.   Wt Readings from Last 3 Encounters:  09/28/19 220 lb (99.8 kg)  06/05/19 213 lb (96.6 kg)  01/03/19 224 lb (101.6 kg)    End of life planning was discussed. He has an advanced directive and living will.   He has no acute complaints   Review of Systems  Constitutional: Negative.   HENT: Negative.   Eyes: Negative.   Respiratory: Negative.   Cardiovascular: Negative.   Gastrointestinal: Negative.   Endocrine: Negative.   Genitourinary: Negative.   Musculoskeletal: Negative.   Skin: Negative.   Allergic/Immunologic: Negative.   Neurological: Negative.   Hematological: Negative.   Psychiatric/Behavioral: Negative.   All other systems reviewed and are negative.  Past Medical History:  Diagnosis Date  . BACK PAIN, LUMBAR 10/18/2009  . Benign paroxysmal positional vertigo 09/30/2007  . CAD (coronary artery disease)   . Cancer [facility])    prostate   . CHF (congestive heart failure) ([place])   . CKD (chronic kidney disease)   . COLONIC POLYPS, ADENOMATOUS, HX OF   . GERD (gastroesophageal reflux disease)   .  Herpes zoster with other nervous system complications(053.19)   . Hyperlipidemia   . Hypertension   . Hyperthyroidism    pt denies  . Loss of hearing    Bil/has hearing aids in both ears  . Nerve  damage of left foot   . PERIPHERAL NEUROPATHY 10/11/2007  . 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 PROSTATE CANCER, HX OF 07/07/2007   s/p prostatectomy  . RLS (restless legs syndrome)   . S/P skin biopsy 06/12/2019  . Ulcer   . ULCERATIVE COLITIS, LEFT SIDED 2000  . URTICARIA, ALLERGIC 08/05/2009  . VENOUS INSUFFICIENCY 07/28/2010    Social History   Socioeconomic History  . Marital status: Married    Spouse name: [name]  . Number of children: 3  . Years of education: Not on file  . Highest education level: Not on file  Occupational History  . Occupation: retired    Fish farm manager: RETIRED  . Occupation: [employer]  Tobacco Use  . Smoking status: Former Smoker    Types: Cigarettes    Quit date: 09/08/1975    Years since quitting: 44.0  . Smokeless tobacco: Never Used  Substance and Sexual Activity  . Alcohol use: Yes    Alcohol/week: 15.0 standard drinks    Types: 15 Standard drinks or equivalent per week    Comment: 1-2 "high balls" daily  . Drug use: No  . Sexual activity: Not on file  Other Topics Concern  . 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 All live in [place] area   Works part time at [employer]:   . Difficulty of Paying Living Expenses: Not on file  Food Insecurity:   . Worried About Charity fundraiser in the Last Year: Not on file  . Ran Out of Food in the Last Year: Not on file  Transportation Needs:   . Lack of Transportation (Medical): Not on file  . Lack of Transportation (Non-Medical): Not on file  Physical Activity:   . Days of Exercise per Week: Not on file  . Minutes of Exercise per Session: Not on file  Stress:   . Feeling of Stress : Not on file  Social Connections:   . Frequency of Communication with Friends and Family: Not on file  . Frequency of Social Gatherings with  Friends and Family: Not on file  . Attends Religious Services: Not on file  . Active Member of Clubs or Organizations: Not on file  . Attends Archivist Meetings: Not on file  . Marital Status: Not on file  Intimate Partner Violence:   . Fear of Current or Ex-Partner: Not on file  . Emotionally Abused: Not on file  . Physically Abused: Not on file  . Sexually Abused: Not on file    Past Surgical History:  Procedure Laterality Date  . ANGIOPLASTY  1990's/2004   stent placement 2  . COLONOSCOPY W/ BIOPSIES AND POLYPECTOMY  06/06/2009   adenomatous polyp, diverticulosis, colitis  . INGUINAL HERNIA REPAIR     x x  . ORIF FIBULA FRACTURE Left 03/02/2017   Procedure: OPEN REDUCTION INTERNAL FIXATION (ORIF) LEFT DISTAL FIBULA FRACTURE WITH DELTOID LIGAMENT REPAIR;  Surgeon: [name], MD;  Location: [facility];  Service: Orthopedics;  Laterality: Left;  . PROSTATECTOMY    .  RETINAL LASER PROCEDURE Right 2016 & 2017   . ROTATOR CUFF REPAIR Left   . TONSILLECTOMY      Family History  Problem Relation Age of Onset  . Heart attack Father   . Throat cancer Brother   . Multiple sclerosis Brother   . Multiple sclerosis Son   . Diabetes Maternal Uncle        x 2  . Diabetes Maternal Uncle   . Colon cancer Neg Hx   . Stroke Neg Hx     Allergies  Allergen Reactions  . Cephalexin Hives    Current Outpatient Medications on File Prior to Visit  Medication Sig Dispense Refill  . aspirin 81 MG tablet Take 81 mg by mouth daily.      Marland Kitchen atorvastatin (LIPITOR) 40 MG tablet Take 40 mg by mouth daily.    . carvedilol (COREG) 12.5 MG tablet Take 12.5 mg by mouth daily.    . clopidogrel (PLAVIX) 75 MG tablet TAKE 1 TABLET DAILY (Patient taking differently: Take 75 mg by mouth daily. ) 90 tablet 1  . fenofibrate (TRICOR) 145 MG tablet TAKE 1 TABLET BY MOUTH EVERY DAY (Patient taking differently: Take 145 mg by mouth daily. ) 90 tablet 2  . hydrochlorothiazide (HYDRODIURIL) 25 MG  tablet TAKE 1 TABLET BY MOUTH EVERY DAY (Patient taking differently: Take 25 mg by mouth daily. ) 90 tablet 3  . isosorbide mononitrate (IMDUR) 60 MG 24 hr tablet TAKE 1 TABLET BY MOUTH EVERY DAY (Patient taking differently: Take 60 mg by mouth daily. ) 90 tablet 2  . meclizine (ANTIVERT) 12.5 MG tablet TAKE 1 TABLET (12.5 MG TOTAL) BY MOUTH 2 (TWO) TIMES DAILY AS NEEDED FOR DIZZINESS. 30 tablet 0  . Mesalamine 800 MG TBEC Take 3 tablets (2,400 mg total) by mouth 2 (two) times daily. 180 tablet 5  . nitroGLYCERIN (NITROSTAT) 0.4 MG SL tablet Place 1 tablet (0.4 mg total) under the tongue every 5 (five) minutes as needed for chest pain. 25 tablet 6  . pantoprazole (PROTONIX) 40 MG tablet TAKE 1 TABLET DAILY (Patient taking differently: Take 40 mg by mouth daily. ) 90 tablet 0  . pramipexole (MIRAPEX) 1 MG tablet TAKE 1 TABLET AT BEDTIME (Patient taking differently: Take 1 mg by mouth at bedtime. ) 90 tablet 1   Current Facility-Administered Medications on File Prior to Visit  Medication Dose Route Frequency Provider Last Rate Last Admin  . ipratropium-albuterol (DUONEB) 0.5-2.5 (3) MG/3ML nebulizer solution 3 mL  3 mL Nebulization Once Girolamo Lortie, NP        BP 140/90   Pulse 66   Temp 98.2 F (36.8 C) (Other (Comment))   Ht 6' 1.75" (1.873 m)   Wt 220 lb (99.8 kg)   SpO2 95%   BMI 28.44 kg/m       Objective:   Physical Exam Vitals and nursing note reviewed.  Constitutional:      General: He is not in acute distress.    Appearance: Normal appearance. He is well-developed. He is obese. He is not diaphoretic.  HENT:     Head: Normocephalic and atraumatic.     Right Ear: Tympanic membrane, ear canal and external ear normal. There is no impacted cerumen.     Left Ear: Tympanic membrane, ear canal and external ear normal. There is no impacted cerumen.     Nose: Nose normal. No congestion or rhinorrhea.     Mouth/Throat:     Mouth: Mucous membranes are moist.  Pharynx: Oropharynx  is clear. No oropharyngeal exudate.  Eyes:     General:        Right eye: No discharge.        Left eye: No discharge.     Conjunctiva/sclera: Conjunctivae normal.     Pupils: Pupils are equal, round, and reactive to light.  Neck:     Thyroid: No thyromegaly.     Trachea: No tracheal deviation.  Cardiovascular:     Rate and Rhythm: Normal rate and regular rhythm.     Pulses: Normal pulses.     Heart sounds: Normal heart sounds. No murmur. No friction rub. No gallop.   Pulmonary:     Effort: Pulmonary effort is normal. No respiratory distress.     Breath sounds: Normal breath sounds. No stridor. No wheezing, rhonchi or rales.  Chest:     Chest wall: No tenderness.  Abdominal:     General: Bowel sounds are normal. There is no distension.     Palpations: Abdomen is soft. There is no mass.     Tenderness: There is no abdominal tenderness. There is no right CVA tenderness, left CVA tenderness, guarding or rebound.     Hernia: No hernia is present.  Musculoskeletal:        General: No swelling, tenderness, deformity or signs of injury. Normal range of motion.     Right lower leg: No edema.     Left lower leg: No edema.  Lymphadenopathy:     Cervical: No cervical adenopathy.  Skin:    General: Skin is warm and dry.     Coloration: Skin is not jaundiced or pale.     Findings: No bruising, erythema, lesion or rash.  Neurological:     General: No focal deficit present.     Mental Status: He is alert and oriented to person, place, and time. Mental status is at baseline.     Cranial Nerves: No cranial nerve deficit.     Coordination: Coordination normal.  Psychiatric:        Mood and Affect: Mood normal.        Behavior: Behavior normal.        Thought Content: Thought content normal.        Judgment: Judgment normal.       Assessment & Plan:  1. Routine general medical examination at a health care facility - Encouraged weight loss through diet and exercise - Follow up in one  year or sooner if needed - CBC with Differential/Platelet - Comprehensive metabolic panel - Lipid panel - TSH  2. Essential hypertension - No change in medications  - CBC with Differential/Platelet - Comprehensive metabolic panel - Lipid panel - TSH  3. Atherosclerosis of native coronary artery of native heart with angina pectoris ([place]) - Continue with Plavix, statin, fenofibrate, and Asa - CBC with Differential/Platelet - Comprehensive metabolic panel - Lipid panel - TSH  4. Gastroesophageal reflux disease, unspecified whether esophagitis present - Continue with Protonix  5. ULCERATIVE COLITIS, LEFT SIDED - Well managed.  - Follow up with GI as directed  6. Pure hypercholesterolemia - Continue with stain, fenofibrate and ASA - CBC with Differential/Platelet - Comprehensive metabolic panel - Lipid panel - TSH  7. PROSTATE CANCER, HX OF - Continue with follow up with Urology   8. Restless leg syndrome - CBC with Differential/Platelet - Comprehensive metabolic panel - Lipid panel - TSH   Dorothyann Peng, NP

## 2019-10-06 ENCOUNTER — Ambulatory Visit: Payer: Self-pay

## 2019-10-07 ENCOUNTER — Ambulatory Visit: Payer: Federal, State, Local not specified - PPO | Attending: Internal Medicine

## 2019-10-07 DIAGNOSIS — Z23 Encounter for immunization: Secondary | ICD-10-CM | POA: Insufficient documentation

## 2019-10-07 NOTE — Progress Notes (Signed)
   Covid-19 Vaccination Clinic  Name:  DEAVIN FORST    MRN: [id number] DOB: 1935/12/24  10/07/2019  Mr. Segundo was observed post Covid-19 immunization for 30 minutes based on pre-vaccination screening without incidence. He was provided with Vaccine Information Sheet and instruction to access the V-Safe system.   Mr. Corpus was instructed to call 911 with any severe reactions post vaccine: Marland Kitchen Difficulty breathing  . Swelling of your face and throat  . A fast heartbeat  . A bad rash all over your body  . Dizziness and weakness    Immunizations Administered    Name Date Dose VIS Date Route   Pfizer COVID-19 Vaccine 10/07/2019  8:10 AM 0.3 mL 08/18/2019 Intramuscular   Manufacturer: [place]   Lot: [id number]   [place]: [id number]

## 2019-11-22 NOTE — Progress Notes (Signed)
CARDIOLOGY OFFICE NOTE  Date:  11/28/2019    Kristopher Wilson Date of Birth: [birth year] Medical Record #[id number]  PCP:  Dorothyann Peng, NP  Cardiologist:  Servando Snare    Chief Complaint  Patient presents with  . Follow-up    History of Present Illness: Kristopher Wilson is a 84 y.o. male who presents today for a 6 month check. He is a former patient of Dr. Claris Gladden - basically follows with me.  He has a history of CAD, ulcerative colitis, and shingles with post-herpetic neuralgia. Patient had PCI in 2000 with BMS to LAD and repeat PCI in 2009 with DES to PLV. ETT-myoview in 3/16 showed no evidence for ischemia or infarction. His activity has been chronicallylimited by a left leg neuropathy that has been thought to be due to post-herpetic neuralgia. He has been seen by neurology for this. He wears an orthotic on the left leg because of foot drop.He remains on aspirin and Plavix long term.   I have seen him multiple times over the past several years. He has had some intermittent chest pain - he has opted for medical management - he did have a Myoview updated in 2016 which was ok.We have had to adjust his medicines for his BP. Salt and alcohol continue to play a role.  Had [place] with chest pain - had forgotten his NTG - relieved with deep breaths - had not recurred. Last seen back in September - he was doing well. Walking more and eating less with COVID.   The patient does not have symptoms concerning for COVID-19 infection (fever, chills, cough, or new shortness of breath).   Comes in today. Here alone. He is doing well. They sold their house, bought a new one and have moved since I last saw. Still sorting thru boxes. Feels good. Has had both COVID vaccines. No chest pain. Breathing is good. Not dizzy or lightheaded. He has noted some whiteness of the tips of his fingers in the cold weather. Sounds like Raynaud's. He is wearing gloves - taking  precautionary measures. Overall, he feels like he is doing well.   Past Medical History:  Diagnosis Date  . BACK PAIN, LUMBAR 10/18/2009  . Benign paroxysmal positional vertigo 09/30/2007  . CAD (coronary artery disease)   . Cancer [facility])    prostate   . CHF (congestive heart failure) ([place])   . CKD (chronic kidney disease)   . COLONIC POLYPS, ADENOMATOUS, HX OF   . GERD (gastroesophageal reflux disease)   . Herpes zoster with other nervous system complications(053.19)   . Hyperlipidemia   . Hypertension   . Hyperthyroidism    pt denies  . Loss of hearing    Bil/has hearing aids in both ears  . Nerve damage of left foot   . PERIPHERAL NEUROPATHY 10/11/2007  . 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 PROSTATE CANCER, HX OF 07/07/2007   s/p prostatectomy  . RLS (restless legs syndrome)   . S/P skin biopsy 06/12/2019  . Ulcer   . ULCERATIVE COLITIS, LEFT SIDED 2000  . URTICARIA, ALLERGIC 08/05/2009  . VENOUS INSUFFICIENCY 07/28/2010    Past Surgical History:  Procedure Laterality Date  . ANGIOPLASTY  1990's/2004   stent placement 2  . COLONOSCOPY W/ BIOPSIES AND POLYPECTOMY  06/06/2009   adenomatous polyp, diverticulosis, colitis  . INGUINAL HERNIA REPAIR     x x  . ORIF FIBULA FRACTURE Left 03/02/2017   Procedure: OPEN REDUCTION INTERNAL  FIXATION (ORIF) LEFT DISTAL FIBULA FRACTURE WITH DELTOID LIGAMENT REPAIR;  Surgeon: [name], MD;  Location: [facility];  Service: Orthopedics;  Laterality: Left;  . PROSTATECTOMY    . RETINAL LASER PROCEDURE Right 2016 & 2017   . ROTATOR CUFF REPAIR Left   . TONSILLECTOMY       Medications: Current Meds  Medication Sig  . aspirin 81 MG tablet Take 81 mg by mouth daily.    Marland Kitchen atorvastatin (LIPITOR) 40 MG tablet Take 40 mg by mouth daily.  . carvedilol (COREG) 12.5 MG tablet Take 12.5 mg by mouth daily.  . clopidogrel (PLAVIX) 75 MG tablet TAKE 1 TABLET DAILY  . fenofibrate (TRICOR) 145 MG tablet TAKE 1 TABLET BY MOUTH EVERY DAY  .  hydrochlorothiazide (HYDRODIURIL) 25 MG tablet TAKE 1 TABLET BY MOUTH EVERY DAY  . isosorbide mononitrate (IMDUR) 60 MG 24 hr tablet TAKE 1 TABLET BY MOUTH EVERY DAY  . meclizine (ANTIVERT) 12.5 MG tablet TAKE 1 TABLET (12.5 MG TOTAL) BY MOUTH 2 (TWO) TIMES DAILY AS NEEDED FOR DIZZINESS.  Marland Kitchen Mesalamine 800 MG TBEC Take 3 tablets (2,400 mg total) by mouth 2 (two) times daily.  . nitroGLYCERIN (NITROSTAT) 0.4 MG SL tablet Place 1 tablet (0.4 mg total) under the tongue every 5 (five) minutes as needed for chest pain.  . pantoprazole (PROTONIX) 40 MG tablet TAKE 1 TABLET DAILY  . pramipexole (MIRAPEX) 1 MG tablet TAKE 1 TABLET AT BEDTIME   Current Facility-Administered Medications for the 11/28/19 encounter (Office Visit) with Burtis Junes, NP  Medication  . ipratropium-albuterol (DUONEB) 0.5-2.5 (3) MG/3ML nebulizer solution 3 mL     Allergies: Allergies  Allergen Reactions  . Cephalexin Hives    Social History: The patient  reports that he quit smoking about 44 years ago. His smoking use included cigarettes. He has never used smokeless tobacco. He reports current alcohol use of about 15.0 standard drinks of alcohol per week. He reports that he does not use drugs.   Family History: The patient's family history includes Diabetes in his maternal uncle and maternal uncle; Heart attack in his father; Multiple sclerosis in his brother and son; Throat cancer in his brother.   Review of Systems: Please see the history of present illness.   All other systems are reviewed and negative.   Physical Exam: VS:  BP 110/72   Pulse 75   Ht 6' 1.75" (1.873 m)   Wt 215 lb (97.5 kg)   SpO2 95%   BMI 27.79 kg/m  .  BMI Body mass index is 27.79 kg/m.  Wt Readings from Last 3 Encounters:  11/28/19 215 lb (97.5 kg)  09/28/19 220 lb (99.8 kg)  06/05/19 213 lb (96.6 kg)    General: Pleasant. Alert and in no acute distress. He looks younger than his stated age.   HEENT: Normal.  Neck: Supple, no  JVD, carotid bruits, or masses noted.  Cardiac: Regular rate and rhythm. No murmurs, rubs, or gallops. No edema.  Respiratory:  Lungs are clear to auscultation bilaterally with normal work of breathing.  GI: Soft and nontender.  MS: No deformity or atrophy. Gait and ROM intact.  Skin: Warm and dry. Color is normal.  Neuro:  Strength and sensation are intact and no gross focal deficits noted.  Psych: Alert, appropriate and with normal affect.   LABORATORY DATA:  EKG:  EKG is not ordered today.   Lab Results  Component Value Date   WBC 5.0 09/28/2019   HGB  13.6 09/28/2019   HCT 40.0 09/28/2019   PLT 233.0 09/28/2019   GLUCOSE 99 09/28/2019   CHOL 194 09/28/2019   TRIG 293.0 (H) 09/28/2019   HDL 42.00 09/28/2019   LDLDIRECT 95.0 09/28/2019   LDLCALC 104 (H) 06/05/2019   ALT 25 09/28/2019   AST 16 09/28/2019   NA 137 09/28/2019   K 3.9 09/28/2019   CL 104 09/28/2019   CREATININE 1.27 09/28/2019   BUN 17 09/28/2019   CO2 27 09/28/2019   TSH 1.95 09/28/2019   PSA 0.14 02/18/2018   INR 0.9 02/29/2008   HGBA1C 5.7 10/11/2007     BNP (last 3 results) No results for input(s): BNP in the last 8760 hours.  ProBNP (last 3 results) No results for input(s): PROBNP in the last 8760 hours.   Other Studies Reviewed Today:  Myoview Impression from 11/2014 Exercise Capacity: Fair exercise capacity. BP Response: Hypertensive blood pressure response. Clinical Symptoms: No chest pain. ECG Impression: No significant ST segment change suggestive of ischemia. Comparison with Prior Nuclear Study: Previous scan is normal    Overall Impression: Normal stress nuclear study.  LV Ejection Fraction: 70%. LV Wall Motion: NL LV Function; NL Wall Motion   Dorris Carnes    Assessment/Plan:  1. CAD with remote PCI - had low risk Myoview in 2016 - we have elected to continue with medical management. Remains on chronic nitrate therapy along with DAPT for prior angina with good  results. Doing well clinically. Would continue his current regimen of nitrate/statin/DAPT.   2. HTN - BP looks good - would stay on current regimen.   3. HLD - on statin - lab from January noted.   4. COVID-19 Education: The signs and symptoms of COVID-19 were discussed with the patient and how to seek care for testing (follow up with PCP or arrange E-visit).  The importance of social distancing, staying at home, hand hygiene and wearing a mask when out in public were discussed today. He has had both vaccines.   Current medicines are reviewed with the patient today.  The patient does not have concerns regarding medicines other than what has been noted above.  The following changes have been made:  See above.  Labs/ tests ordered today include:   No orders of the defined types were placed in this encounter.    Disposition:   FU with me in about 6 months.    Patient is agreeable to this plan and will call if any problems develop in the interim.   SignedTruitt Merle, NP  11/28/2019 3:00 PM  [facility] [address] [place], [place]  [address] Phone: [phone] Fax: [phone]

## 2019-11-28 ENCOUNTER — Ambulatory Visit: Payer: Federal, State, Local not specified - PPO | Admitting: Nurse Practitioner

## 2019-11-28 ENCOUNTER — Other Ambulatory Visit: Payer: Self-pay

## 2019-11-28 ENCOUNTER — Encounter: Payer: Self-pay | Admitting: Nurse Practitioner

## 2019-11-28 VITALS — BP 110/72 | HR 75 | Ht 73.75 in | Wt 215.0 lb

## 2019-11-28 DIAGNOSIS — Z7189 Other specified counseling: Secondary | ICD-10-CM | POA: Diagnosis not present

## 2019-11-28 DIAGNOSIS — I2510 Atherosclerotic heart disease of native coronary artery without angina pectoris: Secondary | ICD-10-CM | POA: Diagnosis not present

## 2019-11-28 DIAGNOSIS — E7800 Pure hypercholesterolemia, unspecified: Secondary | ICD-10-CM

## 2019-11-28 DIAGNOSIS — I10 Essential (primary) hypertension: Secondary | ICD-10-CM | POA: Diagnosis not present

## 2019-11-28 NOTE — Patient Instructions (Signed)
After Visit Summary:  We will be checking the following labs today - NONE   Medication Instructions:    Continue with your current medicines.    If you need a refill on your cardiac medications before your next appointment, please call your pharmacy.     Testing/Procedures To Be Arranged:  N/A  Follow-Up:   See me in 6 months    At [facility], you and your health needs are our priority.  As part of our continuing mission to provide you with exceptional heart care, we have created designated Provider Care Teams.  These Care Teams include your primary Cardiologist (physician) and Advanced Practice Providers (APPs -  Physician Assistants and Nurse Practitioners) who all work together to provide you with the care you need, when you need it.  Special Instructions:  . Stay safe, stay home, wash your hands for at least 20 seconds and wear a mask when out in public.  . It was good to talk with you today.    Call the [place] office at [phone] if you have any questions, problems or concerns.

## 2020-01-08 ENCOUNTER — Other Ambulatory Visit: Payer: Self-pay | Admitting: Nurse Practitioner

## 2020-02-26 HISTORY — PX: SKIN BIOPSY: SHX1

## 2020-04-24 ENCOUNTER — Encounter: Payer: Self-pay | Admitting: Family Medicine

## 2020-05-06 NOTE — Progress Notes (Signed)
CARDIOLOGY OFFICE NOTE  Date:  05/20/2020    Kristopher Wilson Date of Birth: May 13, 1936 Medical Record #[id number]  PCP:  Dorothyann Peng, NP  Cardiologist:  Servando Snare   Chief Complaint  Patient presents with  . Follow-up    History of Present Illness: Kristopher Wilson is a 84 y.o. male who presents today for a 6 month check.  He is a former patient of Dr. Claris Gladden - basically follows with me.  He has a history of CAD, ulcerative colitis, and shingles with post-herpetic neuralgia. He had PCI in 2000 with BMS to LAD and repeat PCI in 2009 with DES to PLV. ETT-Myoview in 3/16 showed no evidence for ischemia or infarction. His activity has been chronicallylimited by a left leg neuropathy that has been thought to be due to post-herpetic neuralgia. He has been seen by neurology for this. He wears an orthotic on the left leg because of foot drop.He remains on aspirin and Plavixlong term.   I have seen him multiple times over the past several years. He has had some intermittent chest pain - he has opted for medical management - he did have a Myoview updated in 2016 which was ok.We have had to adjust his medicines for his BP. Salt and alcohol continue to play a role.  Had [place] a [place] years back with chest pain - had forgotten his NTG - relieved with deep breaths - had not recurred. Last seen back in March and he was doing well. Had moved to new home. Cardiac status ok.   Comes in today. Here alone. Notes more headaches on the right side for the past 2 to 3 months. May be getting more pronounced. Also with some lightheadedness. Carried a pot of beans over the weekend while at an event - had "sharp pain" in the head. Somewhat over the right ear. Seems exertional. "Not a lot of chest pain" but may have had some last night while sitting in his chair - lasted for about a minute - then went away. Not using any NTG at all. Short of breath with exertion - this seems  about the same for him. Has to sit on the side of the bed when he first gets up in the mornings. Lightheaded if gets up too quick. BP 120 to 130 at home typically. Admits to not being well hydrated. He was in the ER back in December with presumed dehydration and had to get IVF. Playing golf several times a week - even in the heat.   Past Medical History:  Diagnosis Date  . BACK PAIN, LUMBAR 10/18/2009  . Benign paroxysmal positional vertigo 09/30/2007  . CAD (coronary artery disease)   . Cancer [facility])    prostate   . CHF (congestive heart failure) ([place])   . CKD (chronic kidney disease)   . COLONIC POLYPS, ADENOMATOUS, HX OF   . GERD (gastroesophageal reflux disease)   . Herpes zoster with other nervous system complications(053.19)   . Hyperlipidemia   . Hypertension   . Hyperthyroidism    pt denies  . Loss of hearing    Bil/has hearing aids in both ears  . Nerve damage of left foot   . PERIPHERAL NEUROPATHY 10/11/2007  . 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 PROSTATE CANCER, HX OF 07/07/2007   s/p prostatectomy  . RLS (restless legs syndrome)   . S/P skin biopsy 06/12/2019  . Ulcer   . ULCERATIVE COLITIS, LEFT SIDED 2000  .  URTICARIA, ALLERGIC 08/05/2009  . VENOUS INSUFFICIENCY 07/28/2010    Past Surgical History:  Procedure Laterality Date  . ANGIOPLASTY  1990's/2004   stent placement 2  . COLONOSCOPY W/ BIOPSIES AND POLYPECTOMY  06/06/2009   adenomatous polyp, diverticulosis, colitis  . INGUINAL HERNIA REPAIR     x x  . ORIF FIBULA FRACTURE Left 03/02/2017   Procedure: OPEN REDUCTION INTERNAL FIXATION (ORIF) LEFT DISTAL FIBULA FRACTURE WITH DELTOID LIGAMENT REPAIR;  Surgeon: [name], MD;  Location: [facility];  Service: Orthopedics;  Laterality: Left;  . PROSTATECTOMY    . RETINAL LASER PROCEDURE Right 2016 & 2017   . ROTATOR CUFF REPAIR Left   . SKIN BIOPSY  02/26/2020   Left Dorsum Bridge of Nose and Right Upper Abdomen  . TONSILLECTOMY        Medications: Current Meds  Medication Sig  . aspirin 81 MG tablet Take 81 mg by mouth daily.    Marland Kitchen atorvastatin (LIPITOR) 40 MG tablet Take 40 mg by mouth daily.  . carvedilol (COREG) 12.5 MG tablet Take 12.5 mg by mouth daily.  . clopidogrel (PLAVIX) 75 MG tablet TAKE 1 TABLET DAILY  . fenofibrate (TRICOR) 145 MG tablet Take 1 tablet (145 mg total) by mouth daily.  . hydrochlorothiazide (HYDRODIURIL) 25 MG tablet TAKE 1 TABLET BY MOUTH EVERY DAY  . isosorbide mononitrate (IMDUR) 60 MG 24 hr tablet TAKE 1 TABLET BY MOUTH EVERY DAY  . meclizine (ANTIVERT) 12.5 MG tablet TAKE 1 TABLET (12.5 MG TOTAL) BY MOUTH 2 (TWO) TIMES DAILY AS NEEDED FOR DIZZINESS.  Marland Kitchen Mesalamine 800 MG TBEC Take 3 tablets (2,400 mg total) by mouth 2 (two) times daily.  . nitroGLYCERIN (NITROSTAT) 0.4 MG SL tablet Place 1 tablet (0.4 mg total) under the tongue every 5 (five) minutes as needed for chest pain.  . pantoprazole (PROTONIX) 40 MG tablet TAKE 1 TABLET DAILY  . pramipexole (MIRAPEX) 1 MG tablet Take 1 tablet (1 mg total) by mouth at bedtime.  . [DISCONTINUED] fenofibrate (TRICOR) 145 MG tablet TAKE 1 TABLET BY MOUTH EVERY DAY   Current Facility-Administered Medications for the 05/20/20 encounter (Office Visit) with Kristopher Junes, NP  Medication  . ipratropium-albuterol (DUONEB) 0.5-2.5 (3) MG/3ML nebulizer solution 3 mL     Allergies: Allergies  Allergen Reactions  . Cephalexin Hives    Social History: The patient  reports that he quit smoking about 44 years ago. His smoking use included cigarettes. He has never used smokeless tobacco. He reports current alcohol use of about 15.0 standard drinks of alcohol per week. He reports that he does not use drugs.   Family History: The patient's family history includes Diabetes in his maternal uncle and maternal uncle; Heart attack in his father; Multiple sclerosis in his brother and son; Throat cancer in his brother.   Review of Systems: Please see the  history of present illness.   All other systems are reviewed and negative.   Physical Exam: VS:  BP 100/60   Pulse 81   Ht 6' 1"  (1.854 m)   Wt 220 lb 12.8 oz (100.2 kg)   SpO2 95%   BMI 29.13 kg/m  .  BMI Body mass index is 29.13 kg/m.  Wt Readings from Last 3 Encounters:  05/20/20 220 lb 12.8 oz (100.2 kg)  11/28/19 215 lb (97.5 kg)  09/28/19 220 lb (99.8 kg)   BP is 132/84 by me.   General: Alert and in no acute distress.   HEENT: Normal.  Neck:  No bruits noted.  Cardiac: Regular rate and rhythm. Heart tones are distant. No edema.  Respiratory:  Lungs are clear to auscultation bilaterally with normal work of breathing.  GI: Soft and nontender.  MS: No deformity or atrophy. Gait and ROM intact.  Skin: Warm and dry. Color is normal.  Neuro:  Strength and sensation are intact and no gross focal deficits noted.  Psych: Alert, appropriate and with normal affect.   LABORATORY DATA:  EKG:  EKG is ordered today. Personally reviewed by me. This demonstrates NSR with no acute changes - HR is 65 - tracing unchanged.   Lab Results  Component Value Date   WBC 5.0 09/28/2019   HGB 13.6 09/28/2019   HCT 40.0 09/28/2019   PLT 233.0 09/28/2019   GLUCOSE 99 09/28/2019   CHOL 194 09/28/2019   TRIG 293.0 (H) 09/28/2019   HDL 42.00 09/28/2019   LDLDIRECT 95.0 09/28/2019   LDLCALC 104 (H) 06/05/2019   ALT 25 09/28/2019   AST 16 09/28/2019   NA 137 09/28/2019   K 3.9 09/28/2019   CL 104 09/28/2019   CREATININE 1.27 09/28/2019   BUN 17 09/28/2019   CO2 27 09/28/2019   TSH 1.95 09/28/2019   PSA 0.14 02/18/2018   INR 0.9 02/29/2008   HGBA1C 5.7 10/11/2007     BNP (last 3 results) No results for input(s): BNP in the last 8760 hours.  ProBNP (last 3 results) No results for input(s): PROBNP in the last 8760 hours.   Other Studies Reviewed Today:  Myoview Impression from 11/2014 Exercise Capacity: Fair exercise capacity. BP Response: Hypertensive blood pressure  response. Clinical Symptoms: No chest pain. ECG Impression: No significant ST segment change suggestive of ischemia. Comparison with Prior Nuclear Study: Previous scan is normal    Overall Impression: Normal stress nuclear study.  LV Ejection Fraction: 70%. LV Wall Motion: NL LV Function; NL Wall Motion   Dorris Carnes    Assessment/Plan:  1. CAD - remote PCI - low risk Myoview from 2016- he has elected to continue with medical management. No real worrisome symptoms noted. EKG is unchanged. He remains on chronic nitrate therapy, beta blocker and DAPT for prior angina with good results.   2. HTN - BP is fine by me today - I have left him on his Coreg and HCTZ.   3. HLD - on statin therapy along with Fenofibrate. Labs today.   4. Positional lightheadedness - he needs to stay hydrated and continue with getting up slowly.   5. Headaches - check sed rate for possible temporal arteritis - if this is ok and his symptoms persist - he needs to get back to his PCP - he is agreeable.   6. COVID 19 - he has been vaccinated - planning on booster vaccine as well.   Current medicines are reviewed with the patient today.  The patient does not have concerns regarding medicines other than what has been noted above.  The following changes have been made:  See above.  Labs/ tests ordered today include:    Orders Placed This Encounter  Procedures  . Basic metabolic panel  . CBC  . Hepatic function panel  . Lipid panel  . TSH  . Sedimentation rate  . EKG 12-Lead     Disposition:   FU with Dr. Marlou Porch in 6 months as new patient.   Patient is agreeable to this plan and will call if any problems develop in the interim.   Signed: Truitt Merle, NP  05/20/2020 8:33 AM  [facility] [address] [place], [place]  [address] Phone: [phone] Fax: [phone]

## 2020-05-14 ENCOUNTER — Encounter: Payer: Self-pay | Admitting: Adult Health

## 2020-05-15 MED ORDER — PRAMIPEXOLE DIHYDROCHLORIDE 1 MG PO TABS: 1.0000 mg | ORAL_TABLET | Freq: Every day | ORAL | 1 refills | Status: DC

## 2020-05-19 ENCOUNTER — Other Ambulatory Visit: Payer: Self-pay | Admitting: Nurse Practitioner

## 2020-05-20 ENCOUNTER — Ambulatory Visit: Payer: Federal, State, Local not specified - PPO | Admitting: Nurse Practitioner

## 2020-05-20 ENCOUNTER — Encounter: Payer: Self-pay | Admitting: Nurse Practitioner

## 2020-05-20 ENCOUNTER — Other Ambulatory Visit: Payer: Self-pay

## 2020-05-20 VITALS — BP 100/60 | HR 81 | Ht 73.0 in | Wt 220.8 lb

## 2020-05-20 DIAGNOSIS — I2510 Atherosclerotic heart disease of native coronary artery without angina pectoris: Secondary | ICD-10-CM | POA: Diagnosis not present

## 2020-05-20 DIAGNOSIS — R519 Headache, unspecified: Secondary | ICD-10-CM

## 2020-05-20 DIAGNOSIS — E7800 Pure hypercholesterolemia, unspecified: Secondary | ICD-10-CM | POA: Diagnosis not present

## 2020-05-20 DIAGNOSIS — I10 Essential (primary) hypertension: Secondary | ICD-10-CM | POA: Diagnosis not present

## 2020-05-20 LAB — BASIC METABOLIC PANEL
BUN/Creatinine Ratio: 15 (ref 10–24)
BUN: 20 mg/dL (ref 8–27)
CO2: 24 mmol/L (ref 20–29)
Calcium: 9.4 mg/dL (ref 8.6–10.2)
Chloride: 99 mmol/L (ref 96–106)
Creatinine, Ser: 1.3 mg/dL — ABNORMAL HIGH (ref 0.76–1.27)
GFR calc Af Amer: 59 mL/min/{1.73_m2} — ABNORMAL LOW (ref >59)
GFR calc non Af Amer: 51 mL/min/{1.73_m2} — ABNORMAL LOW (ref >59)
Glucose: 116 mg/dL — ABNORMAL HIGH (ref 65–99)
Potassium: 4.1 mmol/L (ref 3.5–5.2)
Sodium: 134 mmol/L (ref 134–144)

## 2020-05-20 LAB — CBC
Hematocrit: 39.1 % (ref 37.5–51.0)
Hemoglobin: 13.1 g/dL (ref 13.0–17.7)
MCH: 29.2 pg (ref 26.6–33.0)
MCHC: 33.5 g/dL (ref 31.5–35.7)
MCV: 87 fL (ref 79–97)
Platelets: 270 10*3/uL (ref 150–450)
RBC: 4.48 x10E6/uL (ref 4.14–5.80)
RDW: 12.9 % (ref 11.6–15.4)
WBC: 5.7 10*3/uL (ref 3.4–10.8)

## 2020-05-20 LAB — HEPATIC FUNCTION PANEL
ALT: 26 IU/L (ref 0–44)
AST: 21 IU/L (ref 0–40)
Albumin: 4.4 g/dL (ref 3.6–4.6)
Alkaline Phosphatase: 70 IU/L (ref 44–121)
Bilirubin Total: 0.8 mg/dL (ref 0.0–1.2)
Bilirubin, Direct: 0.21 mg/dL (ref 0.00–0.40)
Total Protein: 5.9 g/dL — ABNORMAL LOW (ref 6.0–8.5)

## 2020-05-20 LAB — LIPID PANEL
Chol/HDL Ratio: 3.9 ratio (ref 0.0–5.0)
Cholesterol, Total: 173 mg/dL (ref 100–199)
HDL: 44 mg/dL (ref >39)
LDL Chol Calc (NIH): 98 mg/dL (ref 0–99)
Triglycerides: 178 mg/dL — ABNORMAL HIGH (ref 0–149)
VLDL Cholesterol Cal: 31 mg/dL (ref 5–40)

## 2020-05-20 LAB — TSH: TSH: 1.87 u[IU]/mL (ref 0.450–4.500)

## 2020-05-20 LAB — SEDIMENTATION RATE: Sed Rate: 2 mm/hr (ref 0–30)

## 2020-05-20 MED ORDER — FENOFIBRATE 145 MG PO TABS: 145.0000 mg | ORAL_TABLET | Freq: Every day | ORAL | 3 refills | Status: DC

## 2020-05-20 NOTE — Patient Instructions (Addendum)
After Visit Summary:  We will be checking the following labs today - BMET, CBC, HPF, Lipids, TSh and sed rate   Medication Instructions:    Continue with your current medicines.   I sent in your Fenofibrate today to your mail order.    If you need a refill on your cardiac medications before your next appointment, please call your pharmacy.     Testing/Procedures To Be Arranged:  N/A  Follow-Up:   See Dr. Marlou Porch as new patient in 6 months.     At [facility], you and your health needs are our priority.  As part of our continuing mission to provide you with exceptional heart care, we have created designated Provider Care Teams.  These Care Teams include your primary Cardiologist (physician) and Advanced Practice Providers (APPs -  Physician Assistants and Nurse Practitioners) who all work together to provide you with the care you need, when you need it.  Special Instructions:  . Stay safe, wash your hands for at least 20 seconds and wear a mask when needed.  . It was good to talk with you today.  . I want you to talk with Tommi Rumps if your labs turn out ok and your headaches persist.    Call the [place] office at [phone] if you have any questions, problems or concerns.

## 2020-05-30 ENCOUNTER — Ambulatory Visit: Payer: Federal, State, Local not specified - PPO | Admitting: Adult Health

## 2020-05-30 ENCOUNTER — Encounter: Payer: Self-pay | Admitting: Adult Health

## 2020-05-30 ENCOUNTER — Other Ambulatory Visit: Payer: Self-pay

## 2020-05-30 VITALS — BP 128/80 | HR 58 | Temp 97.8°F | Wt 219.0 lb

## 2020-05-30 DIAGNOSIS — Z23 Encounter for immunization: Secondary | ICD-10-CM

## 2020-05-30 DIAGNOSIS — I951 Orthostatic hypotension: Secondary | ICD-10-CM | POA: Diagnosis not present

## 2020-05-30 DIAGNOSIS — K219 Gastro-esophageal reflux disease without esophagitis: Secondary | ICD-10-CM | POA: Diagnosis not present

## 2020-05-30 MED ORDER — PANTOPRAZOLE SODIUM 40 MG PO TBEC: 40.0000 mg | DELAYED_RELEASE_TABLET | Freq: Every day | ORAL | 0 refills | Status: DC

## 2020-05-30 MED ORDER — ATORVASTATIN CALCIUM 40 MG PO TABS: 40.0000 mg | ORAL_TABLET | Freq: Every day | ORAL | 3 refills | Status: DC

## 2020-05-30 MED ORDER — CARVEDILOL 12.5 MG PO TABS: 12.5000 mg | ORAL_TABLET | Freq: Every day | ORAL | 3 refills | Status: DC

## 2020-05-30 MED ORDER — PANTOPRAZOLE SODIUM 40 MG PO TBEC: 40.0000 mg | DELAYED_RELEASE_TABLET | Freq: Every day | ORAL | 3 refills | Status: DC

## 2020-05-30 NOTE — Progress Notes (Signed)
Subjective:    Patient ID: Kristopher Wilson, male    DOB: 1935-10-05, 84 y.o.   MRN: [id number]  HPI  84 year old male who  has a past medical history of BACK PAIN, LUMBAR (10/18/2009), Benign paroxysmal positional vertigo (09/30/2007), CAD (coronary artery disease), Cancer ([place]), CHF (congestive heart failure) ([place]), CKD (chronic kidney disease), COLONIC POLYPS, ADENOMATOUS, HX OF, GERD (gastroesophageal reflux disease), Herpes zoster with other nervous system complications(053.19), Hyperlipidemia, Hypertension, Hyperthyroidism, Loss of hearing, Nerve damage of left foot, PERIPHERAL NEUROPATHY (10/11/2007), POSTHERPETIC NEURALGIA (07/03/2010), PROSTATE CANCER, HX OF (07/07/2007), RLS (restless legs syndrome), S/P skin biopsy (06/12/2019), Ulcer, ULCERATIVE COLITIS, LEFT SIDED (2000), URTICARIA, ALLERGIC (08/05/2009), and VENOUS INSUFFICIENCY (07/28/2010).  He presents to the office today for the complaint of intermittent episodes of lightheadedness when he changes positions. Mostly this happens when he gets up too quickly or bends over. Symptoms resolve quickly. Denies symptoms when active. Denies syncope or falls    He does not believe he is staying hydrated but has started to drink more water. He has ordered a new blood pressure cuff and is waiting for it to come in the mail.   He also needs his protonix renewed  Review of Systems See HPI   Past Medical History:  Diagnosis Date  . BACK PAIN, LUMBAR 10/18/2009  . Benign paroxysmal positional vertigo 09/30/2007  . CAD (coronary artery disease)   . Cancer [facility])    prostate   . CHF (congestive heart failure) ([place])   . CKD (chronic kidney disease)   . COLONIC POLYPS, ADENOMATOUS, HX OF   . GERD (gastroesophageal reflux disease)   . Herpes zoster with other nervous system complications(053.19)   . Hyperlipidemia   . Hypertension   . Hyperthyroidism    pt denies  . Loss of hearing    Bil/has hearing aids in both ears  . Nerve damage of left  foot   . PERIPHERAL NEUROPATHY 10/11/2007  . 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 PROSTATE CANCER, HX OF 07/07/2007   s/p prostatectomy  . RLS (restless legs syndrome)   . S/P skin biopsy 06/12/2019  . Ulcer   . ULCERATIVE COLITIS, LEFT SIDED 2000  . URTICARIA, ALLERGIC 08/05/2009  . VENOUS INSUFFICIENCY 07/28/2010    Social History   Socioeconomic History  . Marital status: Married    Spouse name: [name]  . Number of children: 3  . Years of education: Not on file  . Highest education level: Not on file  Occupational History  . Occupation: retired    Fish farm manager: RETIRED  . Occupation: [employer]  Tobacco Use  . Smoking status: Former Smoker    Types: Cigarettes    Quit date: 09/08/1975    Years since quitting: 44.7  . Smokeless tobacco: Never Used  Vaping Use  . Vaping Use: Never used  Substance and Sexual Activity  . Alcohol use: Yes    Alcohol/week: 15.0 standard drinks    Types: 15 Standard drinks or equivalent per week    Comment: 1-2 "high balls" daily  . Drug use: No  . Sexual activity: Not on file  Other Topics Concern  . 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 All live in [place] area   Works part time at [employer]:   .  Difficulty of Paying Living Expenses: Not on file  Food Insecurity:   . Worried About Charity fundraiser in the Last Year: Not on file  . Ran Out of Food in the Last Year: Not on file  Transportation Needs:   . Lack of Transportation (Medical): Not on file  . Lack of Transportation (Non-Medical): Not on file  Physical Activity:   . Days of Exercise per Week: Not on file  . Minutes of Exercise per Session: Not on file  Stress:   . Feeling of Stress : Not on file  Social Connections:   . Frequency of Communication with Friends and Family: Not on file  . Frequency of  Social Gatherings with Friends and Family: Not on file  . Attends Religious Services: Not on file  . Active Member of Clubs or Organizations: Not on file  . Attends Archivist Meetings: Not on file  . Marital Status: Not on file  Intimate Partner Violence:   . Fear of Current or Ex-Partner: Not on file  . Emotionally Abused: Not on file  . Physically Abused: Not on file  . Sexually Abused: Not on file    Past Surgical History:  Procedure Laterality Date  . ANGIOPLASTY  1990's/2004   stent placement 2  . COLONOSCOPY W/ BIOPSIES AND POLYPECTOMY  06/06/2009   adenomatous polyp, diverticulosis, colitis  . INGUINAL HERNIA REPAIR     x x  . ORIF FIBULA FRACTURE Left 03/02/2017   Procedure: OPEN REDUCTION INTERNAL FIXATION (ORIF) LEFT DISTAL FIBULA FRACTURE WITH DELTOID LIGAMENT REPAIR;  Surgeon: [name], MD;  Location: [facility];  Service: Orthopedics;  Laterality: Left;  . PROSTATECTOMY    . RETINAL LASER PROCEDURE Right 2016 & 2017   . ROTATOR CUFF REPAIR Left   . SKIN BIOPSY  02/26/2020   Left Dorsum Bridge of Nose and Right Upper Abdomen  . TONSILLECTOMY      Family History  Problem Relation Age of Onset  . Heart attack Father   . Throat cancer Brother   . Multiple sclerosis Brother   . Multiple sclerosis Son   . Diabetes Maternal Uncle        x 2  . Diabetes Maternal Uncle   . Colon cancer Neg Hx   . Stroke Neg Hx     Allergies  Allergen Reactions  . Cephalexin Hives    Current Outpatient Medications on File Prior to Visit  Medication Sig Dispense Refill  . aspirin 81 MG tablet Take 81 mg by mouth daily.      . clopidogrel (PLAVIX) 75 MG tablet TAKE 1 TABLET DAILY 90 tablet 1  . fenofibrate (TRICOR) 145 MG tablet Take 1 tablet (145 mg total) by mouth daily. 90 tablet 3  . hydrochlorothiazide (HYDRODIURIL) 25 MG tablet TAKE 1 TABLET BY MOUTH EVERY DAY 90 tablet 3  . isosorbide mononitrate (IMDUR) 60 MG 24 hr tablet TAKE 1 TABLET BY MOUTH EVERY DAY 90  tablet 2  . meclizine (ANTIVERT) 12.5 MG tablet TAKE 1 TABLET (12.5 MG TOTAL) BY MOUTH 2 (TWO) TIMES DAILY AS NEEDED FOR DIZZINESS. 30 tablet 0  . Mesalamine 800 MG TBEC Take 3 tablets (2,400 mg total) by mouth 2 (two) times daily. 180 tablet 5  . nitroGLYCERIN (NITROSTAT) 0.4 MG SL tablet Place 1 tablet (0.4 mg total) under the tongue every 5 (five) minutes as needed for chest pain. 25 tablet 6  . pramipexole (MIRAPEX) 1 MG tablet Take 1 tablet (1 mg total)  by mouth at bedtime. 90 tablet 1   Current Facility-Administered Medications on File Prior to Visit  Medication Dose Route Frequency Provider Last Rate Last Admin  . ipratropium-albuterol (DUONEB) 0.5-2.5 (3) MG/3ML nebulizer solution 3 mL  3 mL Nebulization Once Aerionna Moravek, NP        BP 128/80 (BP Location: Left Arm, Patient Position: Sitting, Cuff Size: Normal)   Pulse (!) 58   Temp 97.8 F (36.6 C) (Oral)   Wt 219 lb (99.3 kg)   SpO2 98%   BMI 28.89 kg/m       Objective:   Physical Exam Vitals and nursing note reviewed.  Constitutional:      Appearance: Normal appearance.  Cardiovascular:     Rate and Rhythm: Normal rate and regular rhythm.     Pulses: Normal pulses.     Heart sounds: Normal heart sounds.  Musculoskeletal:        General: Normal range of motion.  Skin:    General: Skin is warm and dry.     Capillary Refill: Capillary refill takes less than 2 seconds.  Neurological:     General: No focal deficit present.     Mental Status: He is alert and oriented to person, place, and time.        Assessment & Plan:  1. Postural hypotension - Not orthostatic in the office today  - Advised to monitor BP at home when this happens  - Can send me his log via mychart. May need to adjust his medications  - Increase fluids throughout the day   2. Gastroesophageal reflux disease, unspecified whether esophagitis present  - pantoprazole (PROTONIX) 40 MG tablet; Take 1 tablet (40 mg total) by mouth daily.   Dispense: 90 tablet; Refill: 3 - pantoprazole (PROTONIX) 40 MG tablet; Take 1 tablet (40 mg total) by mouth daily.  Dispense: 15 tablet; Refill: 0  [employer]

## 2020-05-30 NOTE — Patient Instructions (Signed)
It was great seeing you today   I think your symptoms are either from dehydration or too much blood pressure medication   Please monitor your blood pressure at home and follow up with me via mychart   Make sure you increase fluids

## 2020-06-06 ENCOUNTER — Other Ambulatory Visit: Payer: Self-pay | Admitting: Adult Health

## 2020-06-06 DIAGNOSIS — K219 Gastro-esophageal reflux disease without esophagitis: Secondary | ICD-10-CM

## 2020-06-27 ENCOUNTER — Encounter: Payer: Self-pay | Admitting: Adult Health

## 2020-07-02 ENCOUNTER — Encounter: Payer: Self-pay | Admitting: Adult Health

## 2020-07-02 DIAGNOSIS — K219 Gastro-esophageal reflux disease without esophagitis: Secondary | ICD-10-CM

## 2020-07-03 MED ORDER — PANTOPRAZOLE SODIUM 40 MG PO TBEC: 40.0000 mg | DELAYED_RELEASE_TABLET | Freq: Every day | ORAL | 3 refills | Status: DC

## 2020-07-10 ENCOUNTER — Other Ambulatory Visit: Payer: Self-pay | Admitting: Adult Health

## 2020-07-10 DIAGNOSIS — K219 Gastro-esophageal reflux disease without esophagitis: Secondary | ICD-10-CM

## 2020-07-10 MED ORDER — PANTOPRAZOLE SODIUM 40 MG PO TBEC: 40.0000 mg | DELAYED_RELEASE_TABLET | Freq: Every day | ORAL | 3 refills | Status: DC

## 2020-08-05 ENCOUNTER — Other Ambulatory Visit: Payer: Self-pay | Admitting: Internal Medicine

## 2020-08-16 ENCOUNTER — Encounter: Payer: Self-pay | Admitting: Adult Health

## 2020-10-23 ENCOUNTER — Encounter: Payer: Federal, State, Local not specified - PPO | Admitting: Family Medicine

## 2020-11-08 ENCOUNTER — Other Ambulatory Visit: Payer: Self-pay

## 2020-11-08 ENCOUNTER — Encounter: Payer: Self-pay | Admitting: Cardiology

## 2020-11-08 ENCOUNTER — Ambulatory Visit: Payer: Federal, State, Local not specified - PPO | Admitting: Cardiology

## 2020-11-08 VITALS — BP 122/70 | HR 60 | Ht 73.0 in | Wt 217.0 lb

## 2020-11-08 DIAGNOSIS — E7800 Pure hypercholesterolemia, unspecified: Secondary | ICD-10-CM

## 2020-11-08 DIAGNOSIS — I10 Essential (primary) hypertension: Secondary | ICD-10-CM | POA: Diagnosis not present

## 2020-11-08 DIAGNOSIS — I2510 Atherosclerotic heart disease of native coronary artery without angina pectoris: Secondary | ICD-10-CM

## 2020-11-08 MED ORDER — AMLODIPINE BESYLATE 10 MG PO TABS: 10.0000 mg | ORAL_TABLET | Freq: Every day | ORAL | 3 refills | Status: DC

## 2020-11-08 NOTE — Progress Notes (Signed)
Cardiology Office Note:    Date:  11/08/2020   ID:  Kristopher Wilson, DOB Apr 12, 1936, MRN [id number]  PCP:  Dorothyann Peng, NP   [place]  Cardiologist:  Candee Furbish, MD  Advanced Practice Provider:  No care team member to display Electrophysiologist:  None       Referring MD: Dorothyann Peng, NP     History of Present Illness:    Kristopher Wilson is a 85 y.o. male here for follow-up of CAD.  PCI in 2000 BMS to LAD.  PCI in 2009 DES to posterior lateral.  Myoview 2016 no ischemia no infarction.  Has left leg neuropathy chronically.  Postherpetic neuralgia.  Aspirin and Plavix long-term.  [facility] Merle has seen multiple times in the past year.  Intermittent chest pain at times.  Medical management.  Past Medical History:  Diagnosis Date  . BACK PAIN, LUMBAR 10/18/2009  . Benign paroxysmal positional vertigo 09/30/2007  . CAD (coronary artery disease)   . Cancer [facility])    prostate   . CHF (congestive heart failure) ([place])   . CKD (chronic kidney disease)   . COLONIC POLYPS, ADENOMATOUS, HX OF   . GERD (gastroesophageal reflux disease)   . Herpes zoster with other nervous system complications(053.19)   . Hyperlipidemia   . Hypertension   . Hyperthyroidism    pt denies  . Loss of hearing    Bil/has hearing aids in both ears  . Nerve damage of left foot   . PERIPHERAL NEUROPATHY 10/11/2007  . 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 PROSTATE CANCER, HX OF 07/07/2007   s/p prostatectomy  . RLS (restless legs syndrome)   . S/P skin biopsy 06/12/2019  . Ulcer   . ULCERATIVE COLITIS, LEFT SIDED 2000  . URTICARIA, ALLERGIC 08/05/2009  . VENOUS INSUFFICIENCY 07/28/2010    Past Surgical History:  Procedure Laterality Date  . ANGIOPLASTY  1990's/2004   stent placement 2  . COLONOSCOPY W/ BIOPSIES AND POLYPECTOMY  06/06/2009   adenomatous polyp, diverticulosis, colitis  . INGUINAL HERNIA REPAIR     x x  . ORIF FIBULA FRACTURE Left 03/02/2017    Procedure: OPEN REDUCTION INTERNAL FIXATION (ORIF) LEFT DISTAL FIBULA FRACTURE WITH DELTOID LIGAMENT REPAIR;  Surgeon: [name], MD;  Location: [facility];  Service: Orthopedics;  Laterality: Left;  . PROSTATECTOMY    . RETINAL LASER PROCEDURE Right 2016 & 2017   . ROTATOR CUFF REPAIR Left   . SKIN BIOPSY  02/26/2020   Left Dorsum Bridge of Nose and Right Upper Abdomen  . TONSILLECTOMY      Current Medications: Current Meds  Medication Sig  . aspirin 81 MG tablet Take 81 mg by mouth daily.  Marland Kitchen atorvastatin (LIPITOR) 40 MG tablet Take 1 tablet (40 mg total) by mouth daily.  . carvedilol (COREG) 12.5 MG tablet Take 1 tablet (12.5 mg total) by mouth daily.  . clopidogrel (PLAVIX) 75 MG tablet TAKE 1 TABLET DAILY  . fenofibrate (TRICOR) 145 MG tablet Take 1 tablet (145 mg total) by mouth daily.  . hydrochlorothiazide (HYDRODIURIL) 25 MG tablet TAKE 1 TABLET BY MOUTH EVERY DAY  . isosorbide mononitrate (IMDUR) 60 MG 24 hr tablet TAKE 1 TABLET BY MOUTH EVERY DAY  . meclizine (ANTIVERT) 12.5 MG tablet TAKE 1 TABLET (12.5 MG TOTAL) BY MOUTH 2 (TWO) TIMES DAILY AS NEEDED FOR DIZZINESS.  Marland Kitchen Mesalamine 800 MG TBEC TAKE 3 TABLETS (2,400MG    TOTAL) TWO TIMES A DAY  . nitroGLYCERIN (NITROSTAT)  0.4 MG SL tablet Place 1 tablet (0.4 mg total) under the tongue every 5 (five) minutes as needed for chest pain.  . pantoprazole (PROTONIX) 40 MG tablet Take 1 tablet (40 mg total) by mouth daily.  . pramipexole (MIRAPEX) 1 MG tablet Take 1 tablet (1 mg total) by mouth at bedtime.  . [DISCONTINUED] amLODipine (NORVASC) 10 MG tablet Take 10 mg by mouth daily.   Current Facility-Administered Medications for the 11/08/20 encounter (Office Visit) with Jerline Pain, MD  Medication  . ipratropium-albuterol (DUONEB) 0.5-2.5 (3) MG/3ML nebulizer solution 3 mL     Allergies:   Cephalexin   Social History   Socioeconomic History  . Marital status: Married    Spouse name: [name]  . Number of children: 3  .  Years of education: Not on file  . Highest education level: Not on file  Occupational History  . Occupation: retired    Fish farm manager: RETIRED  . Occupation: [employer]  Tobacco Use  . Smoking status: Former Smoker    Types: Cigarettes    Quit date: 09/08/1975    Years since quitting: 45.2  . Smokeless tobacco: Never Used  Vaping Use  . Vaping Use: Never used  Substance and Sexual Activity  . Alcohol use: Yes    Alcohol/week: 15.0 standard drinks    Types: 15 Standard drinks or equivalent per week    Comment: 1-2 "high balls" daily  . Drug use: No  . Sexual activity: Not on file  Other Topics Concern  . 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 All live in [place] area   Works part time at [employer] Strain: Not on [employer] Insecurity: Not on file  Transportation Needs: Not on file  Physical Activity: Not on file  Stress: Not on file  Social Connections: Not on file     Family History: The patient's family history includes Diabetes in his maternal uncle and maternal uncle; Heart attack in his father; Multiple sclerosis in his brother and son; Throat cancer in his brother. There is no history of Colon cancer or Stroke.  ROS:   Please see the history of present illness.     All other systems reviewed and are negative.  EKGs/Labs/Other Studies Reviewed:    The following studies were reviewed today: ECHO 2011:  - Left ventricle: Systolic function was normal. The estimated   ejection fraction was in the range of 55% to 65%.  - Left atrium: The atrium was mildly dilated.   NUC 2017:  Nuclear stress EF: 67%.  The left ventricular ejection fraction is hyperdynamic (>65%).  There was no ST segment deviation noted during stress.  This is a low risk study. There is no ischemia.  Defect 1: There is a small  defect of mild severity present in the basal inferior location.  Findings consistent with prior inferobasal myocardial infarction.   Recent Labs: 05/20/2020: ALT 26; BUN 20; Creatinine, Ser 1.30; Hemoglobin 13.1; Platelets 270; Potassium 4.1; Sodium 134; TSH 1.870  Recent Lipid Panel    Component Value Date/Time   CHOL 173 05/20/2020 0846   TRIG 178 (H) 05/20/2020 0846   HDL 44 05/20/2020 0846   CHOLHDL 3.9 05/20/2020 0846   CHOLHDL 5 09/28/2019 0729   VLDL 58.6 (H) 09/28/2019 8850  [place] 98 05/20/2020 0846   LDLDIRECT 95.0 09/28/2019 0729     Risk Assessment/Calculations:      Physical Exam:    VS:  BP 122/70 (BP Location: Left Arm, Patient Position: Sitting, Cuff Size: Normal)   Pulse 60   Ht 6' 1"  (1.854 m)   Wt 217 lb (98.4 kg)   SpO2 96%   BMI 28.63 kg/m     Wt Readings from Last 3 Encounters:  11/08/20 217 lb (98.4 kg)  05/30/20 219 lb (99.3 kg)  05/20/20 220 lb 12.8 oz (100.2 kg)     GEN:  Well nourished, well developed in no acute distress HEENT: Normal NECK: No JVD; No carotid bruits LYMPHATICS: No lymphadenopathy CARDIAC: RRR, no murmurs, rubs, gallops RESPIRATORY:  Clear to auscultation without rales, wheezing or rhonchi  ABDOMEN: Soft, non-tender, non-distended MUSCULOSKELETAL:  Left lower ext edema; No deformity  SKIN: Warm and dry NEUROLOGIC:  Alert and oriented x 3 PSYCHIATRIC:  Normal affect   ASSESSMENT:    1. Coronary artery disease involving native coronary artery of native heart without angina pectoris   2. Pure hypercholesterolemia   3. Essential hypertension    PLAN:    In order of problems listed above:  CAD -Remote PCI in the past.  Myoview 2016 unremarkable.  Continue with goal-directed medical therapy.  Chronic nitrates, dual antiplatelet therapy.  Walking during golf no anginal symptoms.  Essential hypertension -Blood pressure excellent.  No changes made.  Medications reviewed as above carvedilol and  HCTZ.  Hyperlipidemia -Continue with statin therapy and fibrate.  Last LDL 98.  Goal would be less than 70.  He is currently on atorvastatin 40 mg and fibrate 145.  No myalgias.  Positional lightheadedness/vertigo -Continue to hydrate.  Careful. --dizzy fix apparatus, he wears on his head that guides him through the Epley maneuver.      Medication Adjustments/Labs and Tests Ordered: Current medicines are reviewed at length with the patient today.  Concerns regarding medicines are outlined above.  No orders of the defined types were placed in this encounter.  No orders of the defined types were placed in this encounter.   Patient Instructions  Medication Instructions:  Your physician recommends that you continue on your current medications as directed. Please refer to the Current Medication list given to you today. *If you need a refill on your cardiac medications before your next appointment, please call your pharmacy*    Follow-Up: At [facility], you and your health needs are our priority.  As part of our continuing mission to provide you with exceptional heart care, we have created designated Provider Care Teams.  These Care Teams include your primary Cardiologist (physician) and Advanced Practice Providers (APPs -  Physician Assistants and Nurse Practitioners) who all work together to provide you with the care you need, when you need it.  We recommend signing up for the patient portal called "MyChart".  Sign up information is provided on this After Visit Summary.  MyChart is used to connect with patients for Virtual Visits (Telemedicine).  Patients are able to view lab/test results, encounter notes, upcoming appointments, etc.  Non-urgent messages can be sent to your provider as well.   To learn more about what you can do with MyChart, go to [email or web address].    Your next appointment:   6 month(s)  The format for your next appointment:   In Person  Provider:   You  may see Candee Furbish, MD or one of the following Advanced Practice Providers  on your designated Care Team:    Kathyrn Drown, NP        Signed, Candee Furbish, MD  11/08/2020 10:33 AM    [place]

## 2020-11-08 NOTE — Patient Instructions (Signed)
Medication Instructions:  Your physician recommends that you continue on your current medications as directed. Please refer to the Current Medication list given to you today. *If you need a refill on your cardiac medications before your next appointment, please call your pharmacy*    Follow-Up: At [facility], you and your health needs are our priority.  As part of our continuing mission to provide you with exceptional heart care, we have created designated Provider Care Teams.  These Care Teams include your primary Cardiologist (physician) and Advanced Practice Providers (APPs -  Physician Assistants and Nurse Practitioners) who all work together to provide you with the care you need, when you need it.  We recommend signing up for the patient portal called "MyChart".  Sign up information is provided on this After Visit Summary.  MyChart is used to connect with patients for Virtual Visits (Telemedicine).  Patients are able to view lab/test results, encounter notes, upcoming appointments, etc.  Non-urgent messages can be sent to your provider as well.   To learn more about what you can do with MyChart, go to [email or web address].    Your next appointment:   6 month(s)  The format for your next appointment:   In Person  Provider:   You may see Candee Furbish, MD or one of the following Advanced Practice Providers on your designated Care Team:    Kathyrn Drown, NP

## 2020-11-22 ENCOUNTER — Other Ambulatory Visit: Payer: Self-pay

## 2020-11-22 ENCOUNTER — Ambulatory Visit: Payer: Federal, State, Local not specified - PPO | Admitting: Family Medicine

## 2020-11-22 ENCOUNTER — Encounter: Payer: Self-pay | Admitting: Family Medicine

## 2020-11-22 VITALS — BP 122/74 | HR 88 | Temp 96.7°F | Ht 73.0 in | Wt 217.8 lb

## 2020-11-22 DIAGNOSIS — I10 Essential (primary) hypertension: Secondary | ICD-10-CM

## 2020-11-22 DIAGNOSIS — R0989 Other specified symptoms and signs involving the circulatory and respiratory systems: Secondary | ICD-10-CM

## 2020-11-22 DIAGNOSIS — I25119 Atherosclerotic heart disease of native coronary artery with unspecified angina pectoris: Secondary | ICD-10-CM | POA: Diagnosis not present

## 2020-11-22 DIAGNOSIS — E7800 Pure hypercholesterolemia, unspecified: Secondary | ICD-10-CM | POA: Diagnosis not present

## 2020-11-22 LAB — COMPREHENSIVE METABOLIC PANEL
ALT: 23 U/L (ref 0–53)
AST: 20 U/L (ref 0–37)
Albumin: 4.4 g/dL (ref 3.5–5.2)
Alkaline Phosphatase: 57 U/L (ref 39–117)
BUN: 27 mg/dL — ABNORMAL HIGH (ref 6–23)
CO2: 28 mEq/L (ref 19–32)
Calcium: 9.7 mg/dL (ref 8.4–10.5)
Chloride: 102 mEq/L (ref 96–112)
Creatinine, Ser: 1.46 mg/dL (ref 0.40–1.50)
GFR: 44.51 mL/min — ABNORMAL LOW (ref >60.00)
Glucose, Bld: 121 mg/dL — ABNORMAL HIGH (ref 70–99)
Potassium: 3.8 mEq/L (ref 3.5–5.1)
Sodium: 140 mEq/L (ref 135–145)
Total Bilirubin: 0.7 mg/dL (ref 0.2–1.2)
Total Protein: 6.6 g/dL (ref 6.0–8.3)

## 2020-11-22 LAB — URINALYSIS, ROUTINE W REFLEX MICROSCOPIC
Bilirubin Urine: NEGATIVE
Hgb urine dipstick: NEGATIVE
Ketones, ur: NEGATIVE
Leukocytes,Ua: NEGATIVE
Nitrite: NEGATIVE
Specific Gravity, Urine: >=1.03 — AB (ref 1.000–1.030)
Total Protein, Urine: NEGATIVE
Urine Glucose: NEGATIVE
Urobilinogen, UA: 0.2 (ref 0.0–1.0)
pH: 5.5 (ref 5.0–8.0)

## 2020-11-22 LAB — CBC
HCT: 42.7 % (ref 39.0–52.0)
Hemoglobin: 14.3 g/dL (ref 13.0–17.0)
MCHC: 33.6 g/dL (ref 30.0–36.0)
MCV: 86.1 fl (ref 78.0–100.0)
Platelets: 251 10*3/uL (ref 150.0–400.0)
RBC: 4.96 Mil/uL (ref 4.22–5.81)
RDW: 13.9 % (ref 11.5–15.5)
WBC: 6 10*3/uL (ref 4.0–10.5)

## 2020-11-22 LAB — LIPID PANEL
Cholesterol: 159 mg/dL (ref 0–200)
HDL: 45.8 mg/dL (ref >39.00)
LDL Cholesterol: 89 mg/dL (ref 0–99)
NonHDL: 113.39
Total CHOL/HDL Ratio: 3
Triglycerides: 124 mg/dL (ref 0.0–149.0)
VLDL: 24.8 mg/dL (ref 0.0–40.0)

## 2020-11-22 NOTE — Progress Notes (Signed)
New Patient Office Visit  Subjective:  Patient ID: Kristopher Wilson, male    DOB: 1935-11-10  Age: 85 y.o. MRN: [id number]  CC:  Chief Complaint  Patient presents with  . Transitions Of Care    TOC from [facility] NP at [place]. No concerns.     HPI [name] presents for establishment of care by way of transfer.  Significant past medical history of hypertension hyperlipidemia coronary artery disease restless leg syndrome, ulcerative colitis and left ankle instability status post fracture.  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  Difficult for him to walk great distances without pain.  Ulcerative colitis has been quiesced sent.  Continues follow-up with cardiology and gastroenterology.  Past medical history and social history reviewed.  Past Medical History:  Diagnosis Date  . BACK PAIN, LUMBAR 10/18/2009  . Benign paroxysmal positional vertigo 09/30/2007  . CAD (coronary artery disease)   . Cancer [facility])    prostate   . CHF (congestive heart failure) ([place])   . CKD (chronic kidney disease)   . COLONIC POLYPS, ADENOMATOUS, HX OF   . GERD (gastroesophageal reflux disease)   . Herpes zoster with other nervous system complications(053.19)   . Hyperlipidemia   . Hypertension   . Hyperthyroidism    pt denies  . Loss of hearing    Bil/has hearing aids in both ears  . Nerve damage of left foot   . PERIPHERAL NEUROPATHY 10/11/2007  . 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 PROSTATE CANCER, HX OF 07/07/2007   s/p prostatectomy  . RLS (restless legs syndrome)   . S/P skin biopsy 06/12/2019  . Ulcer   . ULCERATIVE COLITIS, LEFT SIDED 2000  . URTICARIA, ALLERGIC 08/05/2009  . VENOUS INSUFFICIENCY 07/28/2010    Past Surgical History:  Procedure Laterality Date  . ANGIOPLASTY  1990's/2004   stent placement 2  . COLONOSCOPY W/ BIOPSIES AND POLYPECTOMY  06/06/2009   adenomatous polyp, diverticulosis, colitis  . INGUINAL HERNIA REPAIR     x x  . ORIF FIBULA  FRACTURE Left 03/02/2017   Procedure: OPEN REDUCTION INTERNAL FIXATION (ORIF) LEFT DISTAL FIBULA FRACTURE WITH DELTOID LIGAMENT REPAIR;  Surgeon: [name], MD;  Location: [place];  Service: Orthopedics;  Laterality: Left;  . PROSTATECTOMY    . RETINAL LASER PROCEDURE Right 2016 & 2017   . ROTATOR CUFF REPAIR Left   . SKIN BIOPSY  02/26/2020   Left Dorsum Bridge of Nose and Right Upper Abdomen  . TONSILLECTOMY      Family History  Problem Relation Age of Onset  . Heart attack Father   . Throat cancer Brother   . Multiple sclerosis Brother   . Multiple sclerosis Son   . Diabetes Maternal Uncle        x 2  . Diabetes Maternal Uncle   . Colon cancer Neg Hx   . Stroke Neg Hx     Social History   Socioeconomic History  . Marital status: Married    Spouse name: [name]  . Number of children: 3  . Years of education: Not on file  . Highest education level: Not on file  Occupational History  . Occupation: retired    Fish farm manager: RETIRED  . Occupation: [employer]  Tobacco Use  . Smoking status: Former Smoker    Types: Cigarettes    Quit date: 09/08/1975    Years since quitting: 45.2  . Smokeless tobacco: Never Used  Vaping Use  .  Vaping Use: Never used  Substance and Sexual Activity  . Alcohol use: Yes    Alcohol/week: 15.0 standard drinks    Types: 15 Standard drinks or equivalent per week    Comment: 1-2 "high balls" daily  . Drug use: No  . Sexual activity: Not on file  Other Topics Concern  . 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 All live in [place] area   Works part time at [employer]: Not on [employer] Insecurity: Not on file  Transportation Needs: Not on file  Physical Activity: Not on file  Stress: Not on file  Social Connections: Not on file  Intimate Partner Violence: Not on file     ROS Review of Systems  Constitutional: Negative.   HENT: Negative.   Eyes: Negative for photophobia and visual disturbance.  Respiratory: Negative.   Cardiovascular: Negative.   Gastrointestinal: Negative.  Negative for anal bleeding and blood in stool.  Endocrine: Negative for polyphagia and polyuria.  Genitourinary: Negative.   Musculoskeletal: Positive for gait problem.  Skin: Negative for color change and pallor.  Neurological: Positive for dizziness and weakness.  Psychiatric/Behavioral: Negative.     Objective:   Today's Vitals: BP 122/74   Pulse 88   Temp (!) 96.7 F (35.9 C) (Temporal)   Ht 6' 1"  (1.854 m)   Wt 217 lb 12.8 oz (98.8 kg)   SpO2 94%   BMI 28.74 kg/m   Physical Exam Vitals and nursing note reviewed.  Constitutional:      General: He is not in acute distress.    Appearance: Normal appearance. He is not ill-appearing, toxic-appearing or diaphoretic.  HENT:     Head: Normocephalic and atraumatic.     Right Ear: Tympanic membrane, ear canal and external ear normal.     Left Ear: Tympanic membrane, ear canal and external ear normal.     Mouth/Throat:     Mouth: Mucous membranes are moist.     Pharynx: Oropharynx is clear. No oropharyngeal exudate or posterior oropharyngeal erythema.  Eyes:     General: No scleral icterus.    Extraocular Movements: Extraocular movements intact.     Conjunctiva/sclera: Conjunctivae normal.     Pupils: Pupils are equal, round, and reactive to light.  Cardiovascular:     Rate and Rhythm: Normal rate and regular rhythm.     Pulses:          Carotid pulses are 0 on the right side and 1+ on the left side. Pulmonary:     Effort: Pulmonary effort is normal.     Breath sounds: Normal breath sounds.  Abdominal:     General: Bowel sounds are normal.  Musculoskeletal:     Cervical back: No rigidity or tenderness.  Lymphadenopathy:     Cervical: No cervical adenopathy.  Skin:    General: Skin is warm and dry.   Neurological:     Mental Status: He is alert and oriented to person, place, and time.  Psychiatric:        Mood and Affect: Mood normal.        Behavior: Behavior normal.     Assessment & Plan:   Problem List Items Addressed This Visit      Cardiovascular and Mediastinum   Essential hypertension - Primary  Relevant Orders   CBC   Comprehensive metabolic panel   Urinalysis, Routine w reflex microscopic   Atherosclerosis of native coronary artery of native heart with angina pectoris (HCC)   Relevant Orders   CBC   Comprehensive metabolic panel   Lipid panel     Other   Abnormal carotid pulse   Relevant Orders   CAROTID ULTRASOUND (BACK OFFICE)   Pure hypercholesterolemia   Relevant Orders   Comprehensive metabolic panel   Lipid panel      Outpatient Encounter Medications as of 11/22/2020  Medication Sig  . amLODipine (NORVASC) 10 MG tablet Take 1 tablet (10 mg total) by mouth daily.  Marland Kitchen aspirin 81 MG tablet Take 81 mg by mouth daily.  Marland Kitchen atorvastatin (LIPITOR) 40 MG tablet Take 1 tablet (40 mg total) by mouth daily.  . carvedilol (COREG) 12.5 MG tablet Take 1 tablet (12.5 mg total) by mouth daily.  . clopidogrel (PLAVIX) 75 MG tablet TAKE 1 TABLET DAILY  . fenofibrate (TRICOR) 145 MG tablet Take 1 tablet (145 mg total) by mouth daily.  . hydrochlorothiazide (HYDRODIURIL) 25 MG tablet TAKE 1 TABLET BY MOUTH EVERY DAY  . isosorbide mononitrate (IMDUR) 60 MG 24 hr tablet TAKE 1 TABLET BY MOUTH EVERY DAY  . meclizine (ANTIVERT) 12.5 MG tablet TAKE 1 TABLET (12.5 MG TOTAL) BY MOUTH 2 (TWO) TIMES DAILY AS NEEDED FOR DIZZINESS.  Marland Kitchen Mesalamine 800 MG TBEC TAKE 3 TABLETS (2,400MG    TOTAL) TWO TIMES A DAY  . nitroGLYCERIN (NITROSTAT) 0.4 MG SL tablet Place 1 tablet (0.4 mg total) under the tongue every 5 (five) minutes as needed for chest pain.  . pantoprazole (PROTONIX) 40 MG tablet Take 1 tablet (40 mg total) by mouth daily.  . pramipexole (MIRAPEX) 1 MG tablet Take 1 tablet (1  mg total) by mouth at bedtime.   Facility-Administered Encounter Medications as of 11/22/2020  Medication  . ipratropium-albuterol (DUONEB) 0.5-2.5 (3) MG/3ML nebulizer solution 3 mL    Follow-up: Return in about 6 months (around 05/25/2021), or if symptoms worsen or fail to improve.  Continue current medications.  Carotid ultrasound ordered.  Fasting labs checked today.  Filled out handicap sticker today. Libby Maw, MD

## 2020-11-25 ENCOUNTER — Telehealth: Payer: Self-pay | Admitting: Family Medicine

## 2020-11-25 NOTE — Telephone Encounter (Signed)
Pt missed call concerning his most recent lab results. Please advise pt at [phone].

## 2020-11-26 NOTE — Telephone Encounter (Signed)
Returned patients call, made aware of labs

## 2020-11-28 ENCOUNTER — Other Ambulatory Visit: Payer: Self-pay

## 2020-11-28 ENCOUNTER — Telehealth: Payer: Self-pay | Admitting: Family Medicine

## 2020-11-28 NOTE — Telephone Encounter (Signed)
Pt has questions on when his CAROTID ULTRASOUND (BACK OFFICE) (Order [id number]) is going to be scheduled. Please advise at [phone].

## 2020-11-28 NOTE — Addendum Note (Signed)
Addended by: Virl Cagey on: 11/28/2020 03:58 PM   Modules accepted: Orders

## 2020-11-28 NOTE — Addendum Note (Signed)
Addended by: Virl Cagey on: 11/28/2020 03:54 PM   Modules accepted: Orders

## 2020-11-29 NOTE — Telephone Encounter (Signed)
Scheduled

## 2020-12-03 ENCOUNTER — Other Ambulatory Visit: Payer: Self-pay

## 2020-12-03 ENCOUNTER — Ambulatory Visit (HOSPITAL_COMMUNITY)
Admission: RE | Admit: 2020-12-03 | Discharge: 2020-12-03 | Disposition: A | Discharge status: Home / Self Care | Payer: Federal, State, Local not specified - PPO | Source: Ambulatory Visit | Attending: Family Medicine | Admitting: Family Medicine

## 2020-12-03 DIAGNOSIS — I10 Essential (primary) hypertension: Secondary | ICD-10-CM | POA: Diagnosis present

## 2020-12-03 DIAGNOSIS — I25119 Atherosclerotic heart disease of native coronary artery with unspecified angina pectoris: Secondary | ICD-10-CM

## 2020-12-03 DIAGNOSIS — R0989 Other specified symptoms and signs involving the circulatory and respiratory systems: Secondary | ICD-10-CM | POA: Diagnosis not present

## 2020-12-26 ENCOUNTER — Ambulatory Visit (INDEPENDENT_AMBULATORY_CARE_PROVIDER_SITE_OTHER): Payer: Federal, State, Local not specified - PPO

## 2020-12-26 ENCOUNTER — Other Ambulatory Visit: Payer: Self-pay

## 2020-12-26 ENCOUNTER — Ambulatory Visit (INDEPENDENT_AMBULATORY_CARE_PROVIDER_SITE_OTHER): Payer: Federal, State, Local not specified - PPO | Admitting: Podiatry

## 2020-12-26 DIAGNOSIS — R0989 Other specified symptoms and signs involving the circulatory and respiratory systems: Secondary | ICD-10-CM | POA: Diagnosis not present

## 2020-12-26 DIAGNOSIS — I872 Venous insufficiency (chronic) (peripheral): Secondary | ICD-10-CM | POA: Diagnosis not present

## 2020-12-26 DIAGNOSIS — M778 Other enthesopathies, not elsewhere classified: Secondary | ICD-10-CM

## 2020-12-28 NOTE — Progress Notes (Signed)
He presents today for a chief complaint of left foot pain states that it started back in 2011 states that there is a lot of pain a lot of swelling.  Has a history of broken ankle.  Objective: Vital signs stable alert oriented x3 pulses are diminished.  Pitting edema left lower extremity greater than right.  Neurologic tone was intact deep to reflexes intact muscle strength is normal symmetrical.  Orthopedic evaluation straits all joints distal ankle full range of motion without crepitus.  Cutaneous evaluation straight supple hydrated cutis no erythema cellulitis drainage or odor.  Assessment diminished pulses bilateral.  Pitting edema left venous insufficiency most likely cause.  Plan: Requesting ABIs for diminished pulses and Doppler evaluation for venous insufficiency.

## 2020-12-31 ENCOUNTER — Other Ambulatory Visit: Payer: Self-pay | Admitting: Podiatry

## 2020-12-31 DIAGNOSIS — R0989 Other specified symptoms and signs involving the circulatory and respiratory systems: Secondary | ICD-10-CM

## 2021-01-01 ENCOUNTER — Other Ambulatory Visit: Payer: Self-pay

## 2021-01-01 ENCOUNTER — Ambulatory Visit (HOSPITAL_COMMUNITY)
Admission: RE | Admit: 2021-01-01 | Discharge: 2021-01-01 | Disposition: A | Discharge status: Home / Self Care | Payer: Federal, State, Local not specified - PPO | Source: Ambulatory Visit | Attending: Cardiovascular Disease | Admitting: Cardiovascular Disease

## 2021-01-01 ENCOUNTER — Encounter (HOSPITAL_COMMUNITY): Payer: Federal, State, Local not specified - PPO

## 2021-01-01 DIAGNOSIS — I872 Venous insufficiency (chronic) (peripheral): Secondary | ICD-10-CM | POA: Diagnosis present

## 2021-01-02 ENCOUNTER — Ambulatory Visit (HOSPITAL_COMMUNITY)
Admission: RE | Admit: 2021-01-02 | Discharge: 2021-01-02 | Disposition: A | Discharge status: Home / Self Care | Payer: Federal, State, Local not specified - PPO | Source: Ambulatory Visit | Attending: Cardiology | Admitting: Cardiology

## 2021-01-02 DIAGNOSIS — R0989 Other specified symptoms and signs involving the circulatory and respiratory systems: Secondary | ICD-10-CM | POA: Insufficient documentation

## 2021-01-07 ENCOUNTER — Telehealth: Payer: Self-pay | Admitting: *Deleted

## 2021-01-07 DIAGNOSIS — I872 Venous insufficiency (chronic) (peripheral): Secondary | ICD-10-CM

## 2021-01-07 NOTE — Telephone Encounter (Signed)
-----   Message from Garrel Ridgel, [place] sent at 01/07/2021  7:41 AM EDT ----- No arterial disease but positive for venous insufficiency .  Vascular should follow up with him for that.  Please make sure they are seeing him.

## 2021-01-07 NOTE — Telephone Encounter (Signed)
Orders for vascular consult

## 2021-01-16 ENCOUNTER — Other Ambulatory Visit: Payer: Self-pay

## 2021-01-16 ENCOUNTER — Ambulatory Visit: Payer: Federal, State, Local not specified - PPO | Admitting: Physician Assistant

## 2021-01-16 ENCOUNTER — Encounter: Payer: Self-pay | Admitting: Physician Assistant

## 2021-01-16 VITALS — BP 151/84 | HR 58 | Temp 98.2°F | Resp 20 | Ht 73.0 in | Wt 220.9 lb

## 2021-01-16 DIAGNOSIS — I872 Venous insufficiency (chronic) (peripheral): Secondary | ICD-10-CM | POA: Diagnosis not present

## 2021-01-16 DIAGNOSIS — I8393 Asymptomatic varicose veins of bilateral lower extremities: Secondary | ICD-10-CM

## 2021-01-16 NOTE — Progress Notes (Signed)
Requested by:  Libby Maw, MD [place],   [id number]  Reason for consultation: varicose veins with venous insufficiency   History of Present Illness   Kristopher Wilson is a 85 y.o. (1935/10/12) male who presents for evaluation of venous insufficiency. He states that > 10 years ago he had the shingles in his left leg and since then he has had left foot drop and numbness and pain in left leg. Then in 2018 he broke his left ankle and he explains that since then he has had swelling around left ankle but since 2020 he feels that the swelling has progressed and goes up all of left lower leg. The swelling is worse on prolonged standing or ambulation. It is improved with elevation. He additionally has been wearing knee high compression stockings and this helps the swelling as well. He does from time to time Ice his leg as well, which does not improve swelling. He states that when the swelling is significant he has associated aching but otherwise denies any heaviness, tiredness, throbbing, burning, itching, bleeding or ulceration. He has no history of DVT. No family history of DVT or venous disease  Venous symptoms include: aching, swelling Onset/duration: >4 years Occupation: retired Aggravating factors: (sitting, standing) Alleviating factors: (elevation) Compression: knee high Helps:  some Pain medications: None Previous vein procedures: none History of DVT:  None  He has CAD and takes Aspirin, Statin and Plavix. He is seen by Cardiologist Candee Furbish, MD  Past Medical History:  Diagnosis Date  . BACK PAIN, LUMBAR 10/18/2009  . Benign paroxysmal positional vertigo 09/30/2007  . CAD (coronary artery disease)   . Cancer [facility])    prostate   . CHF (congestive heart failure) ([place])   . CKD (chronic kidney disease)   . COLONIC POLYPS, ADENOMATOUS, HX OF   . GERD (gastroesophageal reflux disease)   . Herpes zoster with other nervous system  complications(053.19)   . Hyperlipidemia   . Hypertension   . Hyperthyroidism    pt denies  . Loss of hearing    Bil/has hearing aids in both ears  . Nerve damage of left foot   . PERIPHERAL NEUROPATHY 10/11/2007  . 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 PROSTATE CANCER, HX OF 07/07/2007   s/p prostatectomy  . RLS (restless legs syndrome)   . S/P skin biopsy 06/12/2019  . Ulcer   . ULCERATIVE COLITIS, LEFT SIDED 2000  . URTICARIA, ALLERGIC 08/05/2009  . VENOUS INSUFFICIENCY 07/28/2010    Past Surgical History:  Procedure Laterality Date  . ANGIOPLASTY  1990's/2004   stent placement 2  . COLONOSCOPY W/ BIOPSIES AND POLYPECTOMY  06/06/2009   adenomatous polyp, diverticulosis, colitis  . INGUINAL HERNIA REPAIR     x x  . ORIF FIBULA FRACTURE Left 03/02/2017   Procedure: OPEN REDUCTION INTERNAL FIXATION (ORIF) LEFT DISTAL FIBULA FRACTURE WITH DELTOID LIGAMENT REPAIR;  Surgeon: [name], MD;  Location: [place];  Service: Orthopedics;  Laterality: Left;  . PROSTATECTOMY    . RETINAL LASER PROCEDURE Right 2016 & 2017   . ROTATOR CUFF REPAIR Left   . SKIN BIOPSY  02/26/2020   Left Dorsum Bridge of Nose and Right Upper Abdomen  . TONSILLECTOMY      Social History   Socioeconomic History  . Marital status: Married    Spouse name: [name]  . Number of children: 3  . Years of education: Not on file  . Highest education level: Not on file  Occupational History  . Occupation: retired    Fish farm manager: RETIRED  . Occupation: [employer]  Tobacco Use  . Smoking status: Former Smoker    Types: Cigarettes    Quit date: 09/08/1975    Years since quitting: 45.3  . Smokeless tobacco: Never Used  Vaping Use  . Vaping Use: Never used  Substance and Sexual Activity  . Alcohol use: Yes    Alcohol/week: 15.0 standard drinks    Types: 15 Standard drinks or equivalent per week    Comment: 1-2 "high balls" daily  . Drug use: No  . Sexual activity: Not on file  Other Topics  Concern  . 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 All live in [place] area   Works part time at [employer]: Not on [employer] Insecurity: Not on file  Transportation Needs: Not on file  Physical Activity: Not on file  Stress: Not on file  Social Connections: Not on file  Intimate Partner Violence: Not on file    Family History  Problem Relation Age of Onset  . Heart attack Father   . Throat cancer Brother   . Multiple sclerosis Brother   . Multiple sclerosis Son   . Diabetes Maternal Uncle        x 2  . Diabetes Maternal Uncle   . Colon cancer Neg Hx   . Stroke Neg Hx     Current Outpatient Medications  Medication Sig Dispense Refill  . amLODipine (NORVASC) 10 MG tablet Take 1 tablet (10 mg total) by mouth daily. 90 tablet 3  . aspirin 81 MG tablet Take 81 mg by mouth daily.    Marland Kitchen atorvastatin (LIPITOR) 40 MG tablet Take 1 tablet (40 mg total) by mouth daily. 90 tablet 3  . carvedilol (COREG) 12.5 MG tablet Take 1 tablet (12.5 mg total) by mouth daily. 90 tablet 3  . clopidogrel (PLAVIX) 75 MG tablet TAKE 1 TABLET DAILY 90 tablet 1  . fenofibrate (TRICOR) 145 MG tablet Take 1 tablet (145 mg total) by mouth daily. 90 tablet 3  . hydrochlorothiazide (HYDRODIURIL) 25 MG tablet TAKE 1 TABLET BY MOUTH EVERY DAY 90 tablet 3  . isosorbide mononitrate (IMDUR) 60 MG 24 hr tablet TAKE 1 TABLET BY MOUTH EVERY DAY 90 tablet 2  . meclizine (ANTIVERT) 12.5 MG tablet TAKE 1 TABLET (12.5 MG TOTAL) BY MOUTH 2 (TWO) TIMES DAILY AS NEEDED FOR DIZZINESS. 30 tablet 0  . Mesalamine 800 MG TBEC TAKE 3 TABLETS (2,400MG    TOTAL) TWO TIMES A DAY 180 tablet 5  . nitroGLYCERIN (NITROSTAT) 0.4 MG SL tablet Place 1 tablet (0.4 mg total) under the tongue every 5 (five) minutes as needed for chest pain. 25 tablet 6  . pantoprazole (PROTONIX) 40 MG  tablet Take 1 tablet (40 mg total) by mouth daily. 90 tablet 3  . pramipexole (MIRAPEX) 1 MG tablet Take 1 tablet (1 mg total) by mouth at bedtime. 90 tablet 1   Current Facility-Administered Medications  Medication Dose Route Frequency Provider Last Rate Last Admin  . ipratropium-albuterol (DUONEB) 0.5-2.5 (3) MG/3ML nebulizer solution 3 mL  3 mL Nebulization Once Dorothyann Peng, NP        Allergies  Allergen Reactions  . Cephalexin Hives    REVIEW  OF SYSTEMS (negative unless checked):   Cardiac:  []  Chest pain or chest pressure? []  Shortness of breath upon activity? []  Shortness of breath when lying flat? []  Irregular heart rhythm?  Vascular:  []  Pain in calf, thigh, or hip brought on by walking? []  Pain in feet at night that wakes you up from your sleep? []  Blood clot in your veins? [x]  Leg swelling?  Pulmonary:  []  Oxygen at home? []  Productive cough? []  Wheezing?  Neurologic:  []  Sudden weakness in arms or legs? []  Sudden numbness in arms or legs? []  Sudden onset of difficult speaking or slurred speech? []  Temporary loss of vision in one eye? []  Problems with dizziness?  Gastrointestinal:  []  Blood in stool? []  Vomited blood?  Genitourinary:  []  Burning when urinating? []  Blood in urine?  Psychiatric:  []  Major depression  Hematologic:  []  Bleeding problems? []  Problems with blood clotting?  Dermatologic:  []  Rashes or ulcers?  Constitutional:  []  Fever or chills?  Ear/Nose/Throat:  []  Change in hearing? []  Nose bleeds? []  Sore throat?  Musculoskeletal:  []  Back pain? []  Joint pain? []  Muscle pain?   Physical Examination     Vitals:   01/16/21 0903  BP: (!) 151/84  Pulse: (!) 58  Resp: 20  Temp: 98.2 F (36.8 C)  TempSrc: Temporal  SpO2: 97%  Weight: 220 lb 14.4 oz (100.2 kg)  Height: 6' 1"  (1.854 m)   Body mass index is 29.14 kg/m.  General:  WDWN in NAD; vital signs documented above Gait: Normal HENT: WNL,  normocephalic Pulmonary: normal non-labored breathing, without wheezing Cardiac: regular HR, without  Murmurs without carotid bruit Abdomen: soft, NT, no masses Vascular Exam/Pulses:  Right Left  Radial 2+ (normal) 2+ (normal)  Femoral 2+ (normal) 2+ (normal)  Popliteal 2+ (normal) 2+ (normal)  DP 2+ (normal) 2+ (normal)  PT 2+ (normal) 2+ (normal)   Extremities: with varicose veins of left thigh, without reticular veins, with LLE edema, without stasis pigmentation, without lipodermatosclerosis, without ulcers Musculoskeletal: no muscle wasting or atrophy  Neurologic: A&O X 3;  No focal weakness or paresthesias are detected Psychiatric:  The pt has Normal affect.  Non-invasive Vascular Imaging   BLE Venous Insufficiency Duplex (01/01/21):   LLE:  No DVT and SVT  GSV reflux SFJ to mid calf  GSV diameter 0.33 -0.52  No SSV reflux   CFV, FV deep venous reflux  BLE VAS [place] LE Art Seg Multi (01/02/21) +-------+-----------+-----------+------------+------------+  ABI/TBIToday's ABIToday's TBIPrevious ABIPrevious TBI  +-------+-----------+-----------+------------+------------+  Right 1.19    1.22                  +-------+-----------+-----------+------------+------------+  Left  1.15    1.06                  +-------+-----------+-----------+------------+------------+    Medical Decision Making   VITOR OVERBAUGH is a 85 y.o. male who presents with: LLE chronic venous insufficiency with varicose veins. Duplex shoes deep and superficial venous insufficiency of LLE. No DVT or SVT. His veins are of adequate size to be considered for venous ablation. Recent ABI showed no significant arterial disease and clinically BLE well perfused with palpable pulses   Based on the patient's history and examination, I recommend continued elevation, thigh high compression stockings, exercise therapy, and refraining from prolonged sitting or  ambulation   I discussed with the patient the use of her 20-30 mm thigh high compression stockings and need for 3 month trial of  such.  The patient will follow up in 3 months with Dr. Scot Dock  Thank you for allowing [place] to participate in this patient's care.   Karoline Caldwell, PA-C [facility] Office: [phone]  01/16/2021, 9:36 AM  Clinic MD: Laqueta Due

## 2021-02-11 ENCOUNTER — Encounter: Payer: Self-pay | Admitting: Family Medicine

## 2021-02-11 ENCOUNTER — Ambulatory Visit: Payer: Federal, State, Local not specified - PPO | Admitting: Family Medicine

## 2021-02-11 ENCOUNTER — Other Ambulatory Visit: Payer: Self-pay

## 2021-02-11 VITALS — BP 138/82 | HR 70 | Temp 96.8°F | Ht 73.0 in | Wt 218.0 lb

## 2021-02-11 DIAGNOSIS — I779 Disorder of arteries and arterioles, unspecified: Secondary | ICD-10-CM | POA: Insufficient documentation

## 2021-02-11 DIAGNOSIS — G2581 Restless legs syndrome: Secondary | ICD-10-CM

## 2021-02-11 DIAGNOSIS — I10 Essential (primary) hypertension: Secondary | ICD-10-CM

## 2021-02-11 DIAGNOSIS — H8113 Benign paroxysmal vertigo, bilateral: Secondary | ICD-10-CM | POA: Diagnosis not present

## 2021-02-11 DIAGNOSIS — R944 Abnormal results of kidney function studies: Secondary | ICD-10-CM

## 2021-02-11 LAB — BASIC METABOLIC PANEL
BUN: 22 mg/dL (ref 6–23)
CO2: 24 mEq/L (ref 19–32)
Calcium: 9.7 mg/dL (ref 8.4–10.5)
Chloride: 102 mEq/L (ref 96–112)
Creatinine, Ser: 1.32 mg/dL (ref 0.40–1.50)
GFR: 50.16 mL/min — ABNORMAL LOW (ref >60.00)
Glucose, Bld: 112 mg/dL — ABNORMAL HIGH (ref 70–99)
Potassium: 4.1 mEq/L (ref 3.5–5.1)
Sodium: 137 mEq/L (ref 135–145)

## 2021-02-11 MED ORDER — PRAMIPEXOLE DIHYDROCHLORIDE 1 MG PO TABS: 1.0000 mg | ORAL_TABLET | Freq: Every day | ORAL | 1 refills | Status: DC

## 2021-02-11 MED ORDER — MECLIZINE HCL 12.5 MG PO TABS: 12.5000 mg | ORAL_TABLET | Freq: Two times a day (BID) | ORAL | 0 refills | Status: DC | PRN

## 2021-02-11 NOTE — Patient Instructions (Signed)
Preventing High Cholesterol Cholesterol is a white, waxy substance similar to fat that the human body needs to help build cells. The liver makes all the cholesterol that a person's body needs. Having high cholesterol (hypercholesterolemia) increases your risk for heart disease and stroke. Extra or excess cholesterol comes from the food that you eat. High cholesterol can often be prevented with diet and lifestyle changes. If you already have high cholesterol, you can control it with diet, lifestyle changes, and medicines. How can high cholesterol affect me? If you have high cholesterol, fatty deposits (plaques) may build up on the walls of your blood vessels. The blood vessels that carry blood away from your heart are called arteries. Plaques make the arteries narrower and stiffer. This in turn can:  Restrict or block blood flow and cause blood clots to form.  Increase your risk for heart attack and stroke. What can increase my risk for high cholesterol? This condition is more likely to develop in people who:  Eat foods that are high in saturated fat or cholesterol. Saturated fat is mostly found in foods that come from animal sources.  Are overweight.  Are not getting enough exercise.  Have a family history of high cholesterol (familial hypercholesterolemia). What actions can I take to prevent this? Nutrition  Eat less saturated fat.  Avoid trans fats (partially hydrogenated oils). These are often found in margarine and in some baked goods, fried foods, and snacks bought in packages.  Avoid precooked or cured meat, such as bacon, sausages, or meat loaves.  Avoid foods and drinks that have added sugars.  Eat more fruits, vegetables, and whole grains.  Choose healthy sources of protein, such as fish, poultry, lean cuts of red meat, beans, peas, lentils, and nuts.  Choose healthy sources of fat, such as: ? Nuts. ? Vegetable oils, especially olive oil. ? Fish that have healthy fats,  such as omega-3 fatty acids. These fish include mackerel or salmon.   Lifestyle  Lose weight if you are overweight. Maintaining a healthy body mass index (BMI) can help prevent or control high cholesterol. It can also lower your risk for diabetes and high blood pressure. Ask your health care provider to help you with a diet and exercise plan to lose weight safely.  Do not use any products that contain nicotine or tobacco, such as cigarettes, e-cigarettes, and chewing tobacco. If you need help quitting, ask your health care provider. Alcohol use  Do not drink alcohol if: ? Your health care provider tells you not to drink. ? You are pregnant, may be pregnant, or are planning to become pregnant.  If you drink alcohol: ? Limit how much you use to:  0-1 drink a day for women.  0-2 drinks a day for men. ? Be aware of how much alcohol is in your drink. In the [place] one drink equals one 12 oz bottle of beer (355 mL), one 5 oz glass of wine (148 mL), or one 1 oz glass of hard liquor (44 mL). Activity  Get enough exercise. Do exercises as told by your health care provider.  Each week, do at least 150 minutes of exercise that takes a medium level of effort (moderate-intensity exercise). This kind of exercise: ? Makes your heart beat faster while allowing you to still be able to talk. ? Can be done in short sessions several times a day or longer sessions a few times a week. For example, on 5 days each week, you could walk fast or ride  your bike 3 times a day for 10 minutes each time.   Medicines  Your health care provider may recommend medicines to help lower cholesterol. This may be a medicine to lower the amount of cholesterol that your liver makes. You may need medicine if: ? Diet and lifestyle changes have not lowered your cholesterol enough. ? You have high cholesterol and other risk factors for heart disease or stroke.  Take over-the-counter and prescription medicines only as told by your  health care provider. General information  Manage your risk factors for high cholesterol. Talk with your health care provider about all your risk factors and how to lower your risk.  Manage other conditions that you have, such as diabetes or high blood pressure (hypertension).  Have blood tests to check your cholesterol levels at regular points in time as told by your health care provider.  Keep all follow-up visits as told by your health care provider. This is important. Where to find more information  American Heart Association: www.heart.org  National Heart, Lung, and Blood Institute: https://wilson-eaton.com/ Summary  High cholesterol increases your risk for heart disease and stroke. By keeping your cholesterol level low, you can reduce your risk for these conditions.  High cholesterol can often be prevented with diet and lifestyle changes.  Work with your health care provider to manage your risk factors, and have your blood tested regularly. This information is not intended to replace advice given to you by your health care provider. Make sure you discuss any questions you have with your health care provider. Document Revised: 06/06/2019 Document Reviewed: 06/06/2019 Elsevier Patient Education  [place].

## 2021-02-11 NOTE — Progress Notes (Signed)
Established Patient Office Visit  Subjective:  Patient ID: Kristopher Wilson, male    DOB: 1936-08-08  Age: 85 y.o. MRN: [id number]  CC:  Chief Complaint  Patient presents with  . Follow-up    3 month follow up no concerns. Patient had a cup of coffee.     HPI Kristopher Wilson presents for follow-up of his hypertension, carotid artery disease, decreased GFR and restless leg syndrome.  Restless leg syndrome has been controlled well with daily Mirapex.  Blood pressure is well controlled on his current regimen.  Recent carotid ultrasound studies did show 1 to 39% stenosis in both arteries.  Recent LDL cholesterol was 89 with an HDL of 45.  Triglycerides are controlled at 124.  He is taking 145 mg of fenofibrate and 40 of Lipitor.  He is compliant with his medicines.  He has been trying to drink more water.  He admits to dietary indiscretion with fat and cholesterol.  Past Medical History:  Diagnosis Date  . BACK PAIN, LUMBAR 10/18/2009  . Benign paroxysmal positional vertigo 09/30/2007  . CAD (coronary artery disease)   . Cancer [facility])    prostate   . CHF (congestive heart failure) ([place])   . CKD (chronic kidney disease)   . COLONIC POLYPS, ADENOMATOUS, HX OF   . GERD (gastroesophageal reflux disease)   . Herpes zoster with other nervous system complications(053.19)   . Hyperlipidemia   . Hypertension   . Hyperthyroidism    pt denies  . Loss of hearing    Bil/has hearing aids in both ears  . Nerve damage of left foot   . PERIPHERAL NEUROPATHY 10/11/2007  . 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 PROSTATE CANCER, HX OF 07/07/2007   s/p prostatectomy  . RLS (restless legs syndrome)   . S/P skin biopsy 06/12/2019  . Ulcer   . ULCERATIVE COLITIS, LEFT SIDED 2000  . URTICARIA, ALLERGIC 08/05/2009  . VENOUS INSUFFICIENCY 07/28/2010    Past Surgical History:  Procedure Laterality Date  . ANGIOPLASTY  1990's/2004   stent placement 2  . COLONOSCOPY W/ BIOPSIES AND POLYPECTOMY  06/06/2009    adenomatous polyp, diverticulosis, colitis  . INGUINAL HERNIA REPAIR     x x  . ORIF FIBULA FRACTURE Left 03/02/2017   Procedure: OPEN REDUCTION INTERNAL FIXATION (ORIF) LEFT DISTAL FIBULA FRACTURE WITH DELTOID LIGAMENT REPAIR;  Surgeon: [name], MD;  Location: [place];  Service: Orthopedics;  Laterality: Left;  . PROSTATECTOMY    . RETINAL LASER PROCEDURE Right 2016 & 2017   . ROTATOR CUFF REPAIR Left   . SKIN BIOPSY  02/26/2020   Left Dorsum Bridge of Nose and Right Upper Abdomen  . TONSILLECTOMY      Family History  Problem Relation Age of Onset  . Heart attack Father   . Throat cancer Brother   . Multiple sclerosis Brother   . Multiple sclerosis Son   . Diabetes Maternal Uncle        x 2  . Diabetes Maternal Uncle   . Colon cancer Neg Hx   . Stroke Neg Hx     Social History   Socioeconomic History  . Marital status: Married    Spouse name: [name]  . Number of children: 3  . Years of education: Not on file  . Highest education level: Not on file  Occupational History  . Occupation: retired    Fish farm manager: RETIRED  . Occupation: [employer]  Tobacco Use  . Smoking status:  Former Smoker    Types: Cigarettes    Quit date: 09/08/1975    Years since quitting: 45.4  . Smokeless tobacco: Never Used  Vaping Use  . Vaping Use: Never used  Substance and Sexual Activity  . Alcohol use: Yes    Alcohol/week: 15.0 standard drinks    Types: 15 Standard drinks or equivalent per week    Comment: 1-2 "high balls" daily  . Drug use: No  . Sexual activity: Not on file  Other Topics Concern  . 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 All live in [place] area   Works part time at [employer] of [employer]: Not on [employer] Insecurity: Not on file  Transportation Needs: Not on file  Physical Activity: Not on file   Stress: Not on file  Social Connections: Not on file  Intimate Partner Violence: Not on file    Outpatient Medications Prior to Visit  Medication Sig Dispense Refill  . amLODipine (NORVASC) 10 MG tablet Take 1 tablet (10 mg total) by mouth daily. 90 tablet 3  . aspirin 81 MG tablet Take 81 mg by mouth daily.    Marland Kitchen atorvastatin (LIPITOR) 40 MG tablet Take 1 tablet (40 mg total) by mouth daily. 90 tablet 3  . carvedilol (COREG) 12.5 MG tablet Take 1 tablet (12.5 mg total) by mouth daily. 90 tablet 3  . clopidogrel (PLAVIX) 75 MG tablet TAKE 1 TABLET DAILY 90 tablet 1  . fenofibrate (TRICOR) 145 MG tablet Take 1 tablet (145 mg total) by mouth daily. 90 tablet 3  . hydrochlorothiazide (HYDRODIURIL) 25 MG tablet TAKE 1 TABLET BY MOUTH EVERY DAY 90 tablet 3  . isosorbide mononitrate (IMDUR) 60 MG 24 hr tablet TAKE 1 TABLET BY MOUTH EVERY DAY 90 tablet 2  . Mesalamine 800 MG TBEC TAKE 3 TABLETS (2,400MG    TOTAL) TWO TIMES A DAY 180 tablet 5  . nitroGLYCERIN (NITROSTAT) 0.4 MG SL tablet Place 1 tablet (0.4 mg total) under the tongue every 5 (five) minutes as needed for chest pain. 25 tablet 6  . pantoprazole (PROTONIX) 40 MG tablet Take 1 tablet (40 mg total) by mouth daily. 90 tablet 3  . meclizine (ANTIVERT) 12.5 MG tablet TAKE 1 TABLET (12.5 MG TOTAL) BY MOUTH 2 (TWO) TIMES DAILY AS NEEDED FOR DIZZINESS. 30 tablet 0  . pramipexole (MIRAPEX) 1 MG tablet Take 1 tablet (1 mg total) by mouth at bedtime. 90 tablet 1   Facility-Administered Medications Prior to Visit  Medication Dose Route Frequency Provider Last Rate Last Admin  . ipratropium-albuterol (DUONEB) 0.5-2.5 (3) MG/3ML nebulizer solution 3 mL  3 mL Nebulization Once Dorothyann Peng, NP        Allergies  Allergen Reactions  . Cephalexin Hives    ROS Review of Systems    Objective:    Physical Exam  BP 138/82   Pulse 70   Temp (!) 96.8 F (36 C) (Temporal)   Ht 6' 1"  (1.854 m)   Wt 218 lb (98.9 kg)   SpO2 95%   BMI 28.76  kg/m  Wt Readings from Last 3 Encounters:  02/11/21 218 lb (98.9 kg)  01/16/21 220 lb 14.4 oz (100.2 kg)  11/22/20 217 lb 12.8 oz (98.8 kg)     Health Maintenance Due  Topic  Date Due  . Zoster Vaccines- Shingrix (1 of 2) Never done  . Pneumococcal Vaccine 72-46 Years old (1 of 2 - PPSV23) Never done    There are no preventive care reminders to display for this patient.  Lab Results  Component Value Date   TSH 1.870 05/20/2020   Lab Results  Component Value Date   WBC 6.0 11/22/2020   HGB 14.3 11/22/2020   HCT 42.7 11/22/2020   MCV 86.1 11/22/2020   PLT 251.0 11/22/2020   Lab Results  Component Value Date   NA 140 11/22/2020   K 3.8 11/22/2020   CO2 28 11/22/2020   GLUCOSE 121 (H) 11/22/2020   BUN 27 (H) 11/22/2020   CREATININE 1.46 11/22/2020   BILITOT 0.7 11/22/2020   ALKPHOS 57 11/22/2020   AST 20 11/22/2020   ALT 23 11/22/2020   PROT 6.6 11/22/2020   ALBUMIN 4.4 11/22/2020   CALCIUM 9.7 11/22/2020   ANIONGAP 9 03/02/2017   GFR 44.51 (L) 11/22/2020   Lab Results  Component Value Date   CHOL 159 11/22/2020   Lab Results  Component Value Date   HDL 45.80 11/22/2020   Lab Results  Component Value Date   LDLCALC 89 11/22/2020   Lab Results  Component Value Date   TRIG 124.0 11/22/2020   Lab Results  Component Value Date   CHOLHDL 3 11/22/2020   Lab Results  Component Value Date   HGBA1C 5.7 10/11/2007      Assessment & Plan:   Problem List Items Addressed This Visit      Cardiovascular and Mediastinum   Essential hypertension - Primary   Carotid artery disease (HCC)     Nervous and Auditory   Benign paroxysmal positional vertigo, bilateral   Relevant Medications   meclizine (ANTIVERT) 12.5 MG tablet     Other   Restless leg syndrome   Relevant Medications   pramipexole (MIRAPEX) 1 MG tablet   Decreased calculated GFR   Relevant Orders   Basic metabolic panel      Meds ordered this encounter  Medications  . meclizine  (ANTIVERT) 12.5 MG tablet    Sig: Take 1 tablet (12.5 mg total) by mouth 2 (two) times daily as needed for dizziness.    Dispense:  30 tablet    Refill:  0  . pramipexole (MIRAPEX) 1 MG tablet    Sig: Take 1 tablet (1 mg total) by mouth at bedtime.    Dispense:  90 tablet    Refill:  1    Follow-up: Return Patient has scheduled appointment in 3 months..  He will continue to work on increasing his hydration.  Advised that with further reduction in his GFR we may need to add an ACE inhibitor.  Patient is on fenofibrate and statin with last measured LDL at 89.  Admits dietary indiscretion regarding fat and cholesterol.  Would be concerned about increasing statin dosing for this otherwise healthy 85 year old and increased risk of myopathy.  This was discussed with him.  He will make a concerted effort to decrease the fat and cholesterol in his diet with his now known history of carotid artery disease.  Continue Mirapex for restless leg syndrome.  Libby Maw, MD

## 2021-03-11 ENCOUNTER — Telehealth: Payer: Self-pay

## 2021-03-11 MED ORDER — CLOPIDOGREL BISULFATE 75 MG PO TABS: 75.0000 mg | ORAL_TABLET | Freq: Every day | ORAL | 1 refills | Status: DC

## 2021-03-11 NOTE — Telephone Encounter (Signed)
Received a call from the patient requesting a refill on Plavix,   Reviewed chart.   Rx(s) sent to pharmacy electronically.

## 2021-04-10 ENCOUNTER — Encounter: Payer: Self-pay | Admitting: Emergency Medicine

## 2021-04-10 ENCOUNTER — Other Ambulatory Visit: Payer: Self-pay

## 2021-04-10 ENCOUNTER — Ambulatory Visit
Admission: EM | Admit: 2021-04-10 | Discharge: 2021-04-10 | Disposition: A | Discharge status: Home / Self Care | Payer: Federal, State, Local not specified - PPO | Attending: Family Medicine | Admitting: Family Medicine

## 2021-04-10 DIAGNOSIS — L237 Allergic contact dermatitis due to plants, except food: Secondary | ICD-10-CM | POA: Diagnosis not present

## 2021-04-10 MED ORDER — CLOBETASOL PROPIONATE 0.05 % EX OINT: 1.0000 "application " | TOPICAL_OINTMENT | Freq: Two times a day (BID) | CUTANEOUS | 0 refills | Status: AC | PRN

## 2021-04-10 NOTE — ED Provider Notes (Signed)
[facility]    CSN: [id number] Arrival date & time: 04/10/21  0812      History   Chief Complaint Chief Complaint  Patient presents with   Poison Ivy    HPI Kristopher Wilson is a 85 y.o. male.   Patient presenting today with 2-week history of poison ivy rash on left arm initially and now spread to right arm.  States he was clearing some brush just prior to onset and did not realize there is poison ivy leaves in the brush.  Has been using calamine lotion consistently with minimal relief.  Denies fever, chills, difficulty breathing, throat itching or swelling.   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   PROSTATE CANCER, HX OF 07/07/2007   s/p prostatectomy   RLS (restless legs syndrome)    S/P skin biopsy 06/12/2019   Ulcer    ULCERATIVE COLITIS, LEFT SIDED 2000   URTICARIA, ALLERGIC 08/05/2009   VENOUS INSUFFICIENCY 07/28/2010    Patient Active Problem List   Diagnosis Date Noted   Decreased calculated GFR 02/11/2021   Carotid artery disease ([place]) 02/11/2021   Abnormal carotid pulse 11/22/2020   Pure hypercholesterolemia 11/22/2020   Displaced fracture of distal end of left fibula 03/02/2017   Tear of deltoid ligament of ankle 03/02/2017   Routine general medical examination at a health care facility 02/06/2016   Lumbosacral radiculopathy due to herpes zoster 12/10/2015   Neck pain  on right side 07/22/2015   Long term current use of antithrombotics/antiplatelets - clopidogrel and ASA 03/28/2015   Left shoulder pain 05/31/2014   Restless leg syndrome 11/14/2013   Night sweats 09/12/2013   Hyperlipidemia 12/18/2010   Pain in the chest 08/11/2010   VENOUS INSUFFICIENCY 07/28/2010   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   ULCERATIVE COLITIS, LEFT SIDED 12/27/2009   COLONIC POLYPS, ADENOMATOUS, HX OF 12/27/2009   BACK PAIN, LUMBAR 10/18/2009   GERD 04/18/2008   Benign paroxysmal positional vertigo, bilateral 09/30/2007   Essential hypertension 07/07/2007   Atherosclerosis of native coronary artery of native heart with angina pectoris ([place]) 07/07/2007   PROSTATE CANCER, HX OF 07/07/2007    Past Surgical History:  Procedure Laterality Date   ANGIOPLASTY  1990's/2004   stent placement 2   COLONOSCOPY W/ BIOPSIES AND POLYPECTOMY  06/06/2009   adenomatous polyp, diverticulosis, colitis   INGUINAL HERNIA REPAIR     x x   ORIF FIBULA FRACTURE Left 03/02/2017   Procedure: OPEN REDUCTION INTERNAL FIXATION (ORIF) LEFT DISTAL FIBULA FRACTURE WITH DELTOID LIGAMENT REPAIR;  Surgeon: [name], MD;  Location: [place];  Service: Orthopedics;  Laterality: Left;   PROSTATECTOMY     RETINAL LASER PROCEDURE Right 2016 & 2017    ROTATOR CUFF REPAIR Left    SKIN BIOPSY  02/26/2020   Left Dorsum Bridge  of Nose and Right Upper Abdomen   TONSILLECTOMY         Home Medications    Prior to Admission medications   Medication Sig Start Date End Date Taking? Authorizing Provider  clobetasol ointment (TEMOVATE) 0.96 % Apply 1 application topically 2 (two) times daily as needed for up to 10 days. 04/10/21 04/20/21 Yes Volney American, PA-C  amLODipine (NORVASC) 10 MG tablet Take 1 tablet (10 mg total) by mouth daily. 11/08/20   Jerline Pain, MD  aspirin 81 MG tablet Take 81 mg by mouth daily.    [provider]  atorvastatin (LIPITOR) 40 MG tablet Take 1  tablet (40 mg total) by mouth daily. 05/30/20   Burtis Junes, NP  carvedilol (COREG) 12.5 MG tablet Take 1 tablet (12.5 mg total) by mouth daily. 05/30/20   Burtis Junes, NP  clopidogrel (PLAVIX) 75 MG tablet Take 1 tablet (75 mg total) by mouth daily. 03/11/21   Jerline Pain, MD  fenofibrate (TRICOR) 145 MG tablet Take 1 tablet (145 mg total) by mouth daily. 05/20/20   Burtis Junes, NP  hydrochlorothiazide (HYDRODIURIL) 25 MG tablet TAKE 1 TABLET BY MOUTH EVERY DAY 06/19/19   Burtis Junes, NP  isosorbide mononitrate (IMDUR) 60 MG 24 hr tablet TAKE 1 TABLET BY MOUTH EVERY DAY 01/09/20   Burtis Junes, NP  meclizine (ANTIVERT) 12.5 MG tablet Take 1 tablet (12.5 mg total) by mouth 2 (two) times daily as needed for dizziness. 02/11/21   Libby Maw, MD  Mesalamine 800 MG TBEC TAKE 3 TABLETS (2,400MG    TOTAL) TWO TIMES A DAY 08/05/20   Gatha Mayer, MD  nitroGLYCERIN (NITROSTAT) 0.4 MG SL tablet Place 1 tablet (0.4 mg total) under the tongue every 5 (five) minutes as needed for chest pain. 11/08/18   Burtis Junes, NP  pantoprazole (PROTONIX) 40 MG tablet Take 1 tablet (40 mg total) by mouth daily. 07/10/20   Nafziger, Tommi Rumps, NP  pramipexole (MIRAPEX) 1 MG tablet Take 1 tablet (1 mg total) by mouth at bedtime. 02/11/21   Libby Maw, MD    Family History Family History  Problem Relation Age of Onset   Heart attack Father    Throat cancer Brother    Multiple sclerosis Brother    Multiple sclerosis Son    Diabetes Maternal Uncle        x 2   Diabetes Maternal Uncle    Colon cancer Neg Hx    Stroke Neg Hx     Social History Social History   Tobacco Use   Smoking status: Former    Types: Cigarettes    Quit date: 09/08/1975    Years since quitting: 45.6   Smokeless tobacco: Never  Vaping Use   Vaping Use: Never used  Substance Use Topics   Alcohol use: Yes    Alcohol/week: 15.0 standard drinks    Types: 15 Standard drinks or equivalent per week     Comment: 1-2 "high balls" daily   Drug use: No     Allergies   Cephalexin   Review of Systems Review of Systems Per HPI  Physical Exam Triage Vital Signs ED Triage Vitals  Enc Vitals Group     BP 04/10/21 0822 131/77     Pulse Rate 04/10/21 0822 74     Resp 04/10/21 0822 18     Temp 04/10/21 0822 (!) 97.5 F (36.4 C)     Temp Source 04/10/21 2836  Oral     SpO2 04/10/21 0822 94 %     Weight --      Height --      Head Circumference --      Peak Flow --      Pain Score 04/10/21 0823 0     Pain Loc --      Pain Edu? --      Excl. in [place]? --    No data found.  Updated Vital Signs BP 131/77 (BP Location: Left Arm)   Pulse 74   Temp (!) 97.5 F (36.4 C) (Oral)   Resp 18   SpO2 94%   Visual Acuity Right Eye Distance:   Left Eye Distance:   Bilateral Distance:    Right Eye Near:   Left Eye Near:    Bilateral Near:     Physical Exam Vitals and nursing note reviewed.  Constitutional:      Appearance: Normal appearance.  HENT:     Head: Atraumatic.  Eyes:     Extraocular Movements: Extraocular movements intact.     Conjunctiva/sclera: Conjunctivae normal.  Cardiovascular:     Rate and Rhythm: Normal rate and regular rhythm.  Pulmonary:     Effort: Pulmonary effort is normal.     Breath sounds: Normal breath sounds. No wheezing or rales.  Musculoskeletal:        General: Normal range of motion.     Cervical back: Normal range of motion and neck supple.  Skin:    General: Skin is warm and dry.     Findings: Rash present.     Comments: Erythematous maculopapular rash in linear patterns bilateral upper extremities  Neurological:     General: No focal deficit present.     Mental Status: He is oriented to person, place, and time.  Psychiatric:        Mood and Affect: Mood normal.        Thought Content: Thought content normal.        Judgment: Judgment normal.     UC Treatments / Results  Labs (all labs ordered are listed, but only abnormal results  are displayed) Labs Reviewed - No data to display  EKG   Radiology No results found.  Procedures Procedures (including critical care time)  Medications Ordered in UC Medications - No data to display  Initial Impression / Assessment and Plan / UC Course  I have reviewed the triage vital signs and the nursing notes.  Pertinent labs & imaging results that were available during my care of the patient were reviewed by me and considered in my medical decision making (see chart for details).     Consistent with poison ivy dermatitis, will treat with clobetasol ointment given localization to bilateral upper extremities.  Continue calamine as needed, avoid scratching.  Follow-up if worsening or not resolving.  Final Clinical Impressions(s) / UC Diagnoses   Final diagnoses:  Poison ivy dermatitis   Discharge Instructions   None    ED Prescriptions     Medication Sig Dispense Auth. Provider   clobetasol ointment (TEMOVATE) 3.08 % Apply 1 application topically 2 (two) times daily as needed for up to 10 days. 60 g Volney American, [place]      PDMP not reviewed this encounter.   Volney American, [place] 04/10/21 [phone]

## 2021-04-10 NOTE — ED Triage Notes (Signed)
Believes he got poison oak on his arm 2 weeks prior. Itching patches across right an left upper/lower arms bilaterally. Calamine lotion used with minimal improvement.

## 2021-05-08 ENCOUNTER — Other Ambulatory Visit: Payer: Self-pay

## 2021-05-08 ENCOUNTER — Encounter: Payer: Self-pay | Admitting: Vascular Surgery

## 2021-05-08 ENCOUNTER — Ambulatory Visit (INDEPENDENT_AMBULATORY_CARE_PROVIDER_SITE_OTHER): Payer: Federal, State, Local not specified - PPO | Admitting: Vascular Surgery

## 2021-05-08 VITALS — BP 114/69 | HR 77 | Temp 98.0°F | Resp 18 | Ht 75.0 in | Wt 214.0 lb

## 2021-05-08 DIAGNOSIS — I872 Venous insufficiency (chronic) (peripheral): Secondary | ICD-10-CM | POA: Diagnosis not present

## 2021-05-08 NOTE — Progress Notes (Addendum)
REASON FOR VISIT:   11-monthfollow-up visit for chronic venous insufficiency  MEDICAL ISSUES:   CHRONIC VENOUS INSUFFICIENCY: This patient has CEAP C4a venous disease.  He does have symptoms from venous hypertension.  We have discussed importance of intermittent leg elevation and the proper positioning for this.  I have encouraged him to continue to wear his compression stockings.  We discussed importance of exercise specifically walking and water aerobics.  He knows to avoid prolonged sitting and standing.  Given that he is having persistent symptoms despite conservative measures I think he would benefit from laser ablation of the left great saphenous vein with 10-20 stabs.  I would cannulate the vein in the mid thigh just above the takeoff of these large tributaries.  I have discussed the indications for endovenous laser ablation of the left GSV, that is to lower the pressure in the veins and potentially help relieve the symptoms from venous hypertension.  I have also discussed alternative options such as conservative treatment as described above. I have discussed the potential complications of the procedure, including bleeding, bruising, leg swelling, deep venous thrombosis (<1% risk), or failure of the vein to close <1% risk).  I have also explained that venous insufficiency is a chronic disease, and that the patient is at risk for recurrent varicose veins in the future.  All of the patient's questions were encouraged and answered. They are agreeable to proceed.   I have discussed with the patient the indications for stab phlebectomy.  I have explained to the patient that that will have small scars from the stab incisions.  I explained that the other risks include bruising, bleeding, and phlebitis.   We will schedule his procedure in the near future.   HPI:   Kristopher SCHMIEDERis a pleasant 85y.o. male who was seen by CKaroline Caldwell PDyeron 01/16/2021.  He presented with chronic venous  insufficiency.  He has a somewhat complicated history and that he had shingles in his left leg over 10 years ago and has had a left foot drop with numbness and pain in the left foot since that time.  In 2018 he broke his left ankle and has had swelling around the ankle since that time.  The swelling has progressively worsened on the left since 2020.  It is worse with prolonged standing and sitting.  It is relieved with elevation.  He has been wearing knee-high compression stockings which helped some.  He was having some aching pain associated with the swelling.  He denies any previous history of DVT.  His venous duplex scan showed significant superficial venous reflux on the left and also deep venous reflux.  He was fitted for thigh-high compression stockings with a gradient of 20 to 30 mmHg, encouraged to elevate his legs and exercise.  He comes in for 379-monthollow-up visit.  On my history the patient has had a long history of swelling of the left leg and is recently noted progression of the varicose veins in the left leg.  He describes an aching pain in the leg which is aggravated by standing and relieved with elevation.  His symptoms are worse at the end of the day.  He has been wearing his thigh-high compression stockings which he thinks helps significantly.  He is also been elevating his legs.  He exercises quite a bit.  He has no previous history of DVT.  Has had no previous venous procedures.  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   PROSTATE CANCER, HX OF 07/07/2007   s/p prostatectomy   RLS (restless legs syndrome)    S/P skin biopsy 06/12/2019   Ulcer    ULCERATIVE COLITIS, LEFT SIDED 2000   URTICARIA, ALLERGIC 08/05/2009   VENOUS INSUFFICIENCY 07/28/2010    Family History  Problem Relation Age of Onset   Heart attack Father    Throat cancer Brother    Multiple sclerosis Brother    Multiple sclerosis Son    Diabetes Maternal Uncle        x 2   Diabetes Maternal Uncle    Colon cancer Neg Hx    Stroke Neg Hx     SOCIAL HISTORY: Social History   Tobacco Use   Smoking status: Former    Types: Cigarettes    Quit date: 09/08/1975    Years since quitting: 45.6   Smokeless tobacco: Never  Substance Use Topics   Alcohol use: Yes    Alcohol/week: 15.0 standard drinks    Types: 15 Standard drinks or equivalent per week    Comment: 1-2 "high balls" daily    Allergies  Allergen Reactions   Cephalexin Hives    Current Outpatient Medications  Medication Sig Dispense Refill   amLODipine (NORVASC) 10 MG tablet Take 1 tablet (10 mg total) by mouth daily. 90 tablet 3   aspirin 81 MG tablet Take 81 mg by mouth daily.     atorvastatin (LIPITOR) 40 MG tablet Take 1 tablet (40 mg total) by mouth daily. 90 tablet 3   carvedilol (COREG) 12.5 MG tablet Take 1 tablet (12.5 mg total) by mouth daily. 90 tablet 3   clopidogrel (PLAVIX) 75 MG tablet Take 1 tablet (75 mg total) by mouth daily. 90 tablet 1   fenofibrate (TRICOR) 145 MG tablet Take 1 tablet (145 mg total) by mouth daily. 90 tablet 3   hydrochlorothiazide (HYDRODIURIL) 25 MG tablet TAKE 1 TABLET BY MOUTH EVERY DAY 90 tablet 3   isosorbide mononitrate (IMDUR) 60 MG 24 hr tablet TAKE 1 TABLET BY MOUTH EVERY DAY 90 tablet 2   meclizine (ANTIVERT) 12.5 MG tablet Take 1 tablet (12.5 mg total) by mouth 2 (two) times daily as needed for dizziness. 30  tablet 0   Mesalamine 800 MG TBEC TAKE 3 TABLETS (2,400MG    TOTAL) TWO TIMES A DAY 180 tablet 5   nitroGLYCERIN (NITROSTAT) 0.4 MG SL tablet Place 1 tablet (0.4 mg total) under the tongue every 5 (five) minutes as needed for chest pain. 25 tablet 6   pantoprazole (PROTONIX) 40 MG tablet Take 1 tablet (40 mg total) by mouth daily. 90 tablet 3   pramipexole (MIRAPEX) 1 MG tablet Take 1 tablet (1 mg total) by mouth at bedtime. 90 tablet 1   Current Facility-Administered Medications  Medication Dose Route Frequency Provider Last Rate  Last Admin   ipratropium-albuterol (DUONEB) 0.5-2.5 (3) MG/3ML nebulizer solution 3 mL  3 mL Nebulization Once Nafziger, Cory, NP        REVIEW OF SYSTEMS:  [X]  denotes positive finding, [ ]  denotes negative finding Cardiac  Comments:  Chest pain or chest pressure:    Shortness of breath upon exertion:    Short of breath when lying flat:    Irregular heart rhythm:        Vascular    Pain in calf, thigh, or hip brought on by ambulation:    Pain in feet at night that wakes you up from your sleep:     Blood clot in your veins:    Leg swelling:  x       Pulmonary    Oxygen at home:    Productive cough:     Wheezing:         Neurologic    Sudden weakness in arms or legs:     Sudden numbness in arms or legs:     Sudden onset of difficulty speaking or slurred speech:    Temporary loss of vision in one eye:     Problems with dizziness:         Gastrointestinal    Blood in stool:     Vomited blood:         Genitourinary    Burning when urinating:     Blood in urine:        Psychiatric    Major depression:         Hematologic    Bleeding problems:    Problems with blood clotting too easily:        Skin    Rashes or ulcers:        Constitutional    Fever or chills:     PHYSICAL EXAM:   Vitals:   05/08/21 1248  BP: 114/69  Pulse: 77  Resp: 18  Temp: 98 F (36.7 C)  TempSrc: Temporal  SpO2: 95%  Weight: 214 lb (97.1 kg)  Height: 6'  3" (1.905 m)    GENERAL: The patient is a well-nourished male, in no acute distress. The vital signs are documented above. CARDIAC: There is a regular rate and rhythm.  VASCULAR: I do not detect carotid bruits. I could not palpate pedal pulses however based on previous noninvasive studies he has multiphasic flow in the dorsalis pedis and posterior tibial positions with ABIs of 100% bilaterally. He has significant varicose veins in his medial left thigh and left leg as documented below.  He has some hyperpigmentation bilaterally more so on the left. I did look at his left great saphenous vein myself with the SonoSite.  There is significant reflux in the proximal thigh and the vein measures 5 to 6 mm throughout the proximal thigh.  In the mid thigh it starts giving off large tributaries.  Below that the vein is more difficult to follow.  The vein would be cannulated in the mid thigh above these large tributaries. PULMONARY: There is good air exchange bilaterally without wheezing or rales. ABDOMEN: Soft and non-tender with normal pitched bowel sounds.  MUSCULOSKELETAL: There are no major deformities or cyanosis. NEUROLOGIC: No focal weakness or paresthesias are detected. SKIN: There are no ulcers or rashes noted. PSYCHIATRIC: The patient has a normal affect.  DATA:    VENOUS DUPLEX: I reviewed his venous duplex scan that was done elsewhere on 01/01/2021.  This was of the left lower extremity only.  This showed no evidence of DVT.  He did have deep venous reflux involving the common femoral vein and femoral vein.  There was superficial venous reflux in the left great saphenous vein from the saphenofemoral junction to the proximal calf.  The vein measured 5.2 mm in the proximal thigh.      ARTERIAL DOPPLER STUDY: He did have an arterial Doppler study on 01/02/2021 which showed normal ABIs bilaterally.  Deitra Mayo [facility] [phone]

## 2021-05-20 ENCOUNTER — Ambulatory Visit: Payer: Federal, State, Local not specified - PPO | Admitting: Cardiology

## 2021-05-20 ENCOUNTER — Other Ambulatory Visit: Payer: Self-pay

## 2021-05-20 ENCOUNTER — Encounter: Payer: Self-pay | Admitting: *Deleted

## 2021-05-20 ENCOUNTER — Encounter: Payer: Self-pay | Admitting: Cardiology

## 2021-05-20 VITALS — BP 120/70 | HR 72 | Ht 75.0 in | Wt 219.0 lb

## 2021-05-20 DIAGNOSIS — I6523 Occlusion and stenosis of bilateral carotid arteries: Secondary | ICD-10-CM

## 2021-05-20 DIAGNOSIS — I2510 Atherosclerotic heart disease of native coronary artery without angina pectoris: Secondary | ICD-10-CM

## 2021-05-20 DIAGNOSIS — I872 Venous insufficiency (chronic) (peripheral): Secondary | ICD-10-CM

## 2021-05-20 DIAGNOSIS — R072 Precordial pain: Secondary | ICD-10-CM | POA: Diagnosis not present

## 2021-05-20 DIAGNOSIS — E7800 Pure hypercholesterolemia, unspecified: Secondary | ICD-10-CM

## 2021-05-20 DIAGNOSIS — H8113 Benign paroxysmal vertigo, bilateral: Secondary | ICD-10-CM

## 2021-05-20 DIAGNOSIS — I10 Essential (primary) hypertension: Secondary | ICD-10-CM

## 2021-05-20 DIAGNOSIS — I2583 Coronary atherosclerosis due to lipid rich plaque: Secondary | ICD-10-CM

## 2021-05-20 MED ORDER — HYDROCHLOROTHIAZIDE 25 MG PO TABS: 25.0000 mg | ORAL_TABLET | Freq: Every day | ORAL | 3 refills | Status: DC

## 2021-05-20 NOTE — Assessment & Plan Note (Signed)
Vascular surgery notes reviewed.  Potential procedure to improve venous/varicose veins.  Compression hose.  Conservative measures as well.

## 2021-05-20 NOTE — Progress Notes (Signed)
Cardiology Office Note:    Date:  05/20/2021   ID:  Kristopher Wilson, DOB Jun 22, 1936, MRN [id number]  PCP:  Libby Maw, MD   [facility] Providers Cardiologist:  Candee Furbish, MD     Referring MD: Dorothyann Peng, NP     History of Present Illness:    Kristopher Wilson is a 85 y.o. male with coronary artery disease, PCI in 2000 with bare-metal stent to the LAD and PCI in 2009 with drug-eluting stent to posterior lateral branch-prior stress test 2016 no ischemia or infarction here for follow-up.  Has recently been seen [facility] for varicose veins, Dr. Scot Dock.  Notes reviewed.  Left leg.  Reflux.  He is working right now to see if [employer] will pay for procedure.  Wears compression hose.  Has left leg neuropathy chronically as well.  Has some postherpetic neuralgia.  He has been taking aspirin and Plavix long-term.  No bleeding.  Recently has noted some intermittent chest discomfort at times with and without exertion.  He is going on a cruise in October 2022 and he is somewhat worried.  He told me a story about how a man collapsed in his arms on a cruise ship and died.  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  He has battled with vertigo, dizziness for quite some time.   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   PROSTATE CANCER, HX OF 07/07/2007   s/p prostatectomy    RLS (restless legs syndrome)    S/P skin biopsy 06/12/2019   Ulcer    ULCERATIVE COLITIS, LEFT SIDED 2000   URTICARIA, ALLERGIC 08/05/2009   VENOUS INSUFFICIENCY 07/28/2010    Past Surgical History:  Procedure Laterality Date   ANGIOPLASTY  1990's/2004   stent placement 2   COLONOSCOPY W/ BIOPSIES AND POLYPECTOMY  06/06/2009   adenomatous polyp, diverticulosis, colitis   INGUINAL HERNIA REPAIR     x x   ORIF FIBULA FRACTURE Left 03/02/2017   Procedure: OPEN REDUCTION INTERNAL FIXATION (ORIF) LEFT DISTAL FIBULA FRACTURE WITH DELTOID LIGAMENT REPAIR;  Surgeon: [name], MD;  Location: [facility];  Service: Orthopedics;  Laterality: Left;   PROSTATECTOMY     RETINAL LASER PROCEDURE Right 2016 & 2017    ROTATOR CUFF REPAIR Left    SKIN BIOPSY  02/26/2020   Left Dorsum Bridge of Nose and Right Upper Abdomen   TONSILLECTOMY      Current Medications: Current Meds  Medication Sig   amLODipine (NORVASC) 10 MG tablet Take 1 tablet (10 mg total) by mouth daily.   aspirin 81 MG tablet Take 81 mg by mouth daily.   atorvastatin (LIPITOR) 40 MG tablet Take 1 tablet (40 mg total) by mouth daily.   carvedilol (COREG) 12.5 MG tablet Take 1 tablet (12.5 mg  total) by mouth daily.   clopidogrel (PLAVIX) 75 MG tablet Take 1 tablet (75 mg total) by mouth daily.   fenofibrate (TRICOR) 145 MG tablet Take 1 tablet (145 mg total) by mouth daily.   isosorbide mononitrate (IMDUR) 60 MG 24 hr tablet TAKE 1 TABLET BY MOUTH EVERY DAY   meclizine (ANTIVERT) 12.5 MG tablet Take 1 tablet (12.5 mg total) by mouth 2 (two) times daily as needed for dizziness.   Mesalamine 800 MG TBEC TAKE 3 TABLETS (2,400MG    TOTAL) TWO TIMES A DAY   nitroGLYCERIN (NITROSTAT) 0.4 MG SL tablet Place 1 tablet (0.4 mg total) under the tongue every 5 (five) minutes as needed for chest pain.   pantoprazole (PROTONIX) 40 MG tablet Take 1 tablet (40 mg total) by mouth daily.   pramipexole (MIRAPEX) 1 MG tablet Take 1 tablet (1 mg  total) by mouth at bedtime.   [DISCONTINUED] hydrochlorothiazide (HYDRODIURIL) 25 MG tablet TAKE 1 TABLET BY MOUTH EVERY DAY   Current Facility-Administered Medications for the 05/20/21 encounter (Office Visit) with Jerline Pain, MD  Medication   ipratropium-albuterol (DUONEB) 0.5-2.5 (3) MG/3ML nebulizer solution 3 mL     Allergies:   Cephalexin   Social History   Socioeconomic History   Marital status: Married    Spouse name: [name]   Number of children: 3   Years of education: Not on file   Highest education level: Not on file  Occupational History   Occupation: retired    Fish farm manager: RETIRED   Occupation: [employer]  Tobacco Use   Smoking status: Former    Types: Cigarettes    Quit date: 09/08/1975    Years since quitting: 45.7   Smokeless tobacco: Never  Vaping Use   Vaping Use: Never used  Substance and Sexual Activity   Alcohol use: Yes    Alcohol/week: 15.0 standard drinks    Types: 15 Standard drinks or equivalent per week    Comment: 1-2 "high balls" daily   Drug use: No   Sexual activity: Not on file  Other Topics Concern   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 All live in [place] area   Works part time at [employer] Strain: Not on [employer] Insecurity: Not on file  Transportation Needs: Not on file  Physical Activity: Not on file  Stress: Not on file  Social Connections: Not on file     Family History: The patient's family history includes Diabetes in his maternal uncle and maternal uncle; Heart attack in his father; Multiple sclerosis in his brother and son; Throat cancer in his brother. There is no history of Colon cancer or Stroke.  ROS:   Please see the history of present illness.     All other systems reviewed and are negative.    Recent Labs: 11/22/2020: ALT 23; Hemoglobin  14.3; Platelets 251.0 02/11/2021: BUN 22; Creatinine, Ser 1.32; Potassium 4.1; Sodium 137  Recent Lipid Panel    Component Value Date/Time   CHOL 159 11/22/2020 0943   CHOL 173 05/20/2020 0846   TRIG 124.0 11/22/2020 0943   HDL 45.80 11/22/2020 0943   HDL 44 05/20/2020 0846   CHOLHDL 3 11/22/2020 0943   VLDL 24.8 11/22/2020 0943   LDLCALC 89 11/22/2020 0943   LDLCALC  98 05/20/2020 0846   LDLDIRECT 95.0 09/28/2019 0729     Risk Assessment/Calculations:          Physical Exam:    VS:  BP 120/70 (BP Location: Left Arm, Patient Position: Sitting, Cuff Size: Normal)   Pulse 72   Ht 6' 3"  (1.905 m)   Wt 219 lb (99.3 kg)   SpO2 95%   BMI 27.37 kg/m     Wt Readings from Last 3 Encounters:  05/20/21 219 lb (99.3 kg)  05/08/21 214 lb (97.1 kg)  02/11/21 218 lb (98.9 kg)     GEN:  Well nourished, well developed in no acute distress HEENT: Normal NECK: No JVD; No carotid bruits LYMPHATICS: No lymphadenopathy CARDIAC: RRR, no murmurs, rubs, gallops RESPIRATORY:  Clear to auscultation without rales, wheezing or rhonchi  ABDOMEN: Soft, non-tender, non-distended MUSCULOSKELETAL: Left leg varicose veins noted edema bilaterally; No deformity  SKIN: Warm and dry NEUROLOGIC:  Alert and oriented x 3 PSYCHIATRIC:  Normal affect   ASSESSMENT:    1. Precordial pain   2. Coronary artery disease due to lipid rich plaque   3. Essential hypertension   4. Bilateral carotid artery stenosis   5. Pure hypercholesterolemia   6. Venous (peripheral) insufficiency   7. Benign paroxysmal positional vertigo, bilateral    PLAN:    In order of problems listed above:  Atherosclerosis of native coronary artery of native heart with angina pectoris (HCC) Given some intermittent chest discomfort and known coronary artery disease with prior PCI to LAD and posterior lateral branch, we will go ahead and check a nuclear stress test to ensure that there is no high risk ischemia present.  Continue with  aspirin, Plavix, carvedilol 12.5 mg twice a day, isosorbide 60 mg daily.  He is also on amlodipine 10 mg a day.  This can contribute to lower extremity edema.  Carotid artery disease (HCC) Bilateral nonobstructive disease.  Followed by vascular surgery.  Continue with dual antiplatelet therapy as well as high intensity statin therapy with Lipitor 40 mg daily.  Last LDL 89.  He is also on fenofibrate 145 daily.  Goal would be less than 70.  Could consider addition of Zetia in the future.  Hyperlipidemia On high intensity statin Lipitor 40 mg a day as well as fenofibrate 145 mg a day.  Consider Zetia in the future to achieve goal LDL less than 70.  Currently 89.  Venous (peripheral) insufficiency Vascular surgery notes reviewed.  Potential procedure to improve venous/varicose veins.  Compression hose.  Conservative measures as well.  Benign paroxysmal positional vertigo, bilateral He is little bit worried about going on a cruise ship.  I suggested he try sea bands.  These may help.     Shared Decision Making/Informed Consent The risks [chest pain, shortness of breath, cardiac arrhythmias, dizziness, blood pressure fluctuations, myocardial infarction, stroke/transient ischemic attack, nausea, vomiting, allergic reaction, radiation exposure, metallic taste sensation and life-threatening complications (estimated to be 1 in 10,000)], benefits (risk stratification, diagnosing coronary artery disease, treatment guidance) and alternatives of a nuclear stress test were discussed in detail with Mr. Cina and he agrees to proceed.    Medication Adjustments/Labs and Tests Ordered: Current medicines are reviewed at length with the patient today.  Concerns regarding medicines are outlined above.  Orders Placed This Encounter  Procedures   MYOCARDIAL PERFUSION IMAGING   No orders of the defined types were placed in this encounter.   Patient Instructions  Medication Instructions: The current medical  regimen is  effective;  continue present plan and medications.  *If you need a refill on your cardiac medications before your next appointment, please call your pharmacy*  Testing/Procedures: Your physician has requested that you have a lexiscan myoview. For further information please visit [email or web address]. Please follow instruction sheet, as given.  Follow-Up: At [facility], you and your health needs are our priority.  As part of our continuing mission to provide you with exceptional heart care, we have created designated Provider Care Teams.  These Care Teams include your primary Cardiologist (physician) and Advanced Practice Providers (APPs -  Physician Assistants and Nurse Practitioners) who all work together to provide you with the care you need, when you need it.  We recommend signing up for the patient portal called "MyChart".  Sign up information is provided on this After Visit Summary.  MyChart is used to connect with patients for Virtual Visits (Telemedicine).  Patients are able to view lab/test results, encounter notes, upcoming appointments, etc.  Non-urgent messages can be sent to your provider as well.   To learn more about what you can do with MyChart, go to [email or web address].    Your next appointment:   1 year(s)  The format for your next appointment:   In Person  Provider:   Candee Furbish, MD   Thank you for choosing [facility]!!     Signed, Candee Furbish, MD  05/20/2021 9:40 AM    [place]

## 2021-05-20 NOTE — Assessment & Plan Note (Signed)
He is little bit worried about going on a cruise ship.  I suggested he try sea bands.  These may help.

## 2021-05-20 NOTE — Assessment & Plan Note (Signed)
Bilateral nonobstructive disease.  Followed by vascular surgery.  Continue with dual antiplatelet therapy as well as high intensity statin therapy with Lipitor 40 mg daily.  Last LDL 89.  He is also on fenofibrate 145 daily.  Goal would be less than 70.  Could consider addition of Zetia in the future.

## 2021-05-20 NOTE — Assessment & Plan Note (Signed)
Given some intermittent chest discomfort and known coronary artery disease with prior PCI to LAD and posterior lateral branch, we will go ahead and check a nuclear stress test to ensure that there is no high risk ischemia present.  Continue with aspirin, Plavix, carvedilol 12.5 mg twice a day, isosorbide 60 mg daily.  He is also on amlodipine 10 mg a day.  This can contribute to lower extremity edema.

## 2021-05-20 NOTE — Assessment & Plan Note (Signed)
On high intensity statin Lipitor 40 mg a day as well as fenofibrate 145 mg a day.  Consider Zetia in the future to achieve goal LDL less than 70.  Currently 89.

## 2021-05-20 NOTE — Patient Instructions (Signed)
Medication Instructions: The current medical regimen is effective;  continue present plan and medications.  *If you need a refill on your cardiac medications before your next appointment, please call your pharmacy*  Testing/Procedures: Your physician has requested that you have a lexiscan myoview. For further information please visit [email or web address]. Please follow instruction sheet, as given.  Follow-Up: At [facility], you and your health needs are our priority.  As part of our continuing mission to provide you with exceptional heart care, we have created designated Provider Care Teams.  These Care Teams include your primary Cardiologist (physician) and Advanced Practice Providers (APPs -  Physician Assistants and Nurse Practitioners) who all work together to provide you with the care you need, when you need it.  We recommend signing up for the patient portal called "MyChart".  Sign up information is provided on this After Visit Summary.  MyChart is used to connect with patients for Virtual Visits (Telemedicine).  Patients are able to view lab/test results, encounter notes, upcoming appointments, etc.  Non-urgent messages can be sent to your provider as well.   To learn more about what you can do with MyChart, go to [email or web address].    Your next appointment:   1 year(s)  The format for your next appointment:   In Person  Provider:   Candee Furbish, MD   Thank you for choosing [facility]!!

## 2021-05-21 ENCOUNTER — Other Ambulatory Visit: Payer: Self-pay | Admitting: *Deleted

## 2021-05-21 DIAGNOSIS — I83812 Varicose veins of left lower extremities with pain: Secondary | ICD-10-CM

## 2021-05-26 ENCOUNTER — Ambulatory Visit: Payer: Federal, State, Local not specified - PPO | Admitting: Family Medicine

## 2021-05-26 ENCOUNTER — Encounter: Payer: Self-pay | Admitting: Family Medicine

## 2021-05-26 ENCOUNTER — Other Ambulatory Visit: Payer: Self-pay

## 2021-05-26 VITALS — BP 108/64 | HR 71 | Temp 97.4°F | Ht 75.0 in | Wt 221.8 lb

## 2021-05-26 DIAGNOSIS — Z23 Encounter for immunization: Secondary | ICD-10-CM

## 2021-05-26 DIAGNOSIS — E7800 Pure hypercholesterolemia, unspecified: Secondary | ICD-10-CM | POA: Diagnosis not present

## 2021-05-26 DIAGNOSIS — I10 Essential (primary) hypertension: Secondary | ICD-10-CM | POA: Diagnosis not present

## 2021-05-26 DIAGNOSIS — I779 Disorder of arteries and arterioles, unspecified: Secondary | ICD-10-CM

## 2021-05-26 DIAGNOSIS — R140 Abdominal distension (gaseous): Secondary | ICD-10-CM

## 2021-05-26 DIAGNOSIS — Z0000 Encounter for general adult medical examination without abnormal findings: Secondary | ICD-10-CM

## 2021-05-26 LAB — BASIC METABOLIC PANEL
BUN: 22 mg/dL (ref 6–23)
CO2: 26 mEq/L (ref 19–32)
Calcium: 9.6 mg/dL (ref 8.4–10.5)
Chloride: 101 mEq/L (ref 96–112)
Creatinine, Ser: 1.42 mg/dL (ref 0.40–1.50)
GFR: 45.86 mL/min — ABNORMAL LOW (ref >60.00)
Glucose, Bld: 118 mg/dL — ABNORMAL HIGH (ref 70–99)
Potassium: 4.1 mEq/L (ref 3.5–5.1)
Sodium: 137 mEq/L (ref 135–145)

## 2021-05-26 LAB — URINALYSIS, ROUTINE W REFLEX MICROSCOPIC
Bilirubin Urine: NEGATIVE
Hgb urine dipstick: NEGATIVE
Ketones, ur: NEGATIVE
Leukocytes,Ua: NEGATIVE
Nitrite: NEGATIVE
RBC / HPF: NONE SEEN (ref 0–?)
Specific Gravity, Urine: 1.025 (ref 1.000–1.030)
Total Protein, Urine: NEGATIVE
Urine Glucose: NEGATIVE
Urobilinogen, UA: 0.2 (ref 0.0–1.0)
WBC, UA: NONE SEEN (ref 0–?)
pH: 5.5 (ref 5.0–8.0)

## 2021-05-26 LAB — LIPID PANEL
Cholesterol: 177 mg/dL (ref 0–200)
HDL: 47.2 mg/dL (ref >39.00)
LDL Cholesterol: 94 mg/dL (ref 0–99)
NonHDL: 130.04
Total CHOL/HDL Ratio: 4
Triglycerides: 180 mg/dL — ABNORMAL HIGH (ref 0.0–149.0)
VLDL: 36 mg/dL (ref 0.0–40.0)

## 2021-05-26 LAB — LDL CHOLESTEROL, DIRECT: Direct LDL: 94 mg/dL

## 2021-05-26 MED ORDER — SIMETHICONE 80 MG PO CHEW: 80.0000 mg | CHEWABLE_TABLET | Freq: Four times a day (QID) | ORAL | 0 refills | Status: DC | PRN

## 2021-05-26 NOTE — Progress Notes (Signed)
Established Patient Office Visit  Subjective:  Patient ID: Kristopher Wilson, male    DOB: [birth year]  Age: 85 y.o. MRN: [id number]  CC:  Chief Complaint  Patient presents with   Follow-up    Routine 6 month follow up, patient states that he has a lot of gas taking a flight in a few weeks not sure if there is something that could be done for the gas.     HPI Kristopher Wilson presents for follow-up of hypertension, elevated LDL cholesterol with history of CAD and carotid artery disease.  He has had issues with bloating.  Denies indigestion or burning up into the chest.  Denies constipation or increased flatulence.  He is taking mesalamine history of ulcerative colitis.  Planning a trip to [place] and then sailing on a cruise ship back to [place].  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   PROSTATE CANCER, HX OF 07/07/2007   s/p prostatectomy   RLS (restless legs syndrome)    S/P skin biopsy 06/12/2019   Ulcer    ULCERATIVE COLITIS, LEFT SIDED 2000   URTICARIA, ALLERGIC 08/05/2009   VENOUS INSUFFICIENCY 07/28/2010    Past Surgical History:  Procedure Laterality Date   ANGIOPLASTY  1990's/2004   stent placement 2   COLONOSCOPY W/ BIOPSIES AND POLYPECTOMY  06/06/2009   adenomatous polyp, diverticulosis, colitis   INGUINAL HERNIA REPAIR     x x   ORIF FIBULA FRACTURE Left 03/02/2017   Procedure: OPEN  REDUCTION INTERNAL FIXATION (ORIF) LEFT DISTAL FIBULA FRACTURE WITH DELTOID LIGAMENT REPAIR;  Surgeon: [name], MD;  Location: [facility];  Service: Orthopedics;  Laterality: Left;   PROSTATECTOMY     RETINAL LASER PROCEDURE Right 2016 & 2017    ROTATOR CUFF REPAIR Left    SKIN BIOPSY  02/26/2020   Left Dorsum Bridge of Nose and Right Upper Abdomen   TONSILLECTOMY      Family History  Problem Relation Age of Onset   Heart attack Father    Throat cancer Brother    Multiple sclerosis Brother    Multiple sclerosis Son    Diabetes Maternal Uncle        x 2   Diabetes Maternal Uncle    Colon cancer Neg Hx    Stroke Neg Hx     Social History   Socioeconomic History   Marital status: Married    Spouse name: [name]   Number of children: 3   Years of education: Not on file   Highest education level: Not on file  Occupational History   Occupation: retired    Fish farm manager: RETIRED   Occupation: [employer]  Tobacco Use   Smoking status: Former    Types: Cigarettes    Quit date: 09/08/1975    Years since quitting: 51.7  Smokeless tobacco: Never  Vaping Use   Vaping Use: Never used  Substance and Sexual Activity   Alcohol use: Yes    Alcohol/week: 15.0 standard drinks    Types: 15 Standard drinks or equivalent per week    Comment: 1-2 "high balls" daily   Drug use: No   Sexual activity: Not on file  Other Topics Concern   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 All live in [place] area   Works part time at [employer] Strain: Not on [employer] Insecurity: Not on file  Transportation Needs: Not on file  Physical Activity: Not on file  Stress: Not on file  Social Connections: Not on file  Intimate Partner Violence: Not on file    Outpatient Medications Prior to Visit  Medication Sig Dispense Refill    amLODipine (NORVASC) 10 MG tablet Take 1 tablet (10 mg total) by mouth daily. 90 tablet 3   aspirin 81 MG tablet Take 81 mg by mouth daily.     atorvastatin (LIPITOR) 40 MG tablet Take 1 tablet (40 mg total) by mouth daily. 90 tablet 3   carvedilol (COREG) 12.5 MG tablet Take 1 tablet (12.5 mg total) by mouth daily. 90 tablet 3   clopidogrel (PLAVIX) 75 MG tablet Take 1 tablet (75 mg total) by mouth daily. 90 tablet 1   fenofibrate (TRICOR) 145 MG tablet Take 1 tablet (145 mg total) by mouth daily. 90 tablet 3   hydrochlorothiazide (HYDRODIURIL) 25 MG tablet Take 1 tablet (25 mg total) by mouth daily. 90 tablet 3   isosorbide mononitrate (IMDUR) 60 MG 24 hr tablet TAKE 1 TABLET BY MOUTH EVERY DAY 90 tablet 2   meclizine (ANTIVERT) 12.5 MG tablet Take 1 tablet (12.5 mg total) by mouth 2 (two) times daily as needed for dizziness. 30 tablet 0   Mesalamine 800 MG TBEC TAKE 3 TABLETS (2,400MG    TOTAL) TWO TIMES A DAY 180 tablet 5   nitroGLYCERIN (NITROSTAT) 0.4 MG SL tablet Place 1 tablet (0.4 mg total) under the tongue every 5 (five) minutes as needed for chest pain. 25 tablet 6   pantoprazole (PROTONIX) 40 MG tablet Take 1 tablet (40 mg total) by mouth daily. 90 tablet 3   pramipexole (MIRAPEX) 1 MG tablet Take 1 tablet (1 mg total) by mouth at bedtime. 90 tablet 1   Facility-Administered Medications Prior to Visit  Medication Dose Route Frequency Provider Last Rate Last Admin   ipratropium-albuterol (DUONEB) 0.5-2.5 (3) MG/3ML nebulizer solution 3 mL  3 mL Nebulization Once Nafziger, Cory, NP        Allergies  Allergen Reactions   Cephalexin Hives    ROS Review of Systems  Constitutional:  Negative for chills, diaphoresis, fatigue, fever and unexpected weight change.  HENT: Negative.    Respiratory: Negative.    Cardiovascular: Negative.   Gastrointestinal:  Negative for abdominal distention, abdominal pain, anal bleeding, blood in stool, constipation and diarrhea.  Genitourinary:  Negative.   Musculoskeletal:  Negative for gait problem and joint swelling.  Allergic/Immunologic: Negative for immunocompromised state.  Neurological:  Negative for speech difficulty and weakness.  Psychiatric/Behavioral: Negative.       Objective:    Physical Exam Vitals and nursing note reviewed.  Constitutional:      General: He  is not in acute distress.    Appearance: Normal appearance. He is not ill-appearing, toxic-appearing or diaphoretic.  HENT:     Head: Normocephalic and atraumatic.     Right Ear: External ear normal.     Left Ear: External ear normal.     Mouth/Throat:     Mouth: Mucous membranes are moist.     Pharynx: Oropharynx is clear. No oropharyngeal exudate or posterior oropharyngeal erythema.  Eyes:     General: No scleral icterus.       Right eye: No discharge.        Left eye: No discharge.     Extraocular Movements: Extraocular movements intact.     Conjunctiva/sclera: Conjunctivae normal.     Pupils: Pupils are equal, round, and reactive to light.  Cardiovascular:     Rate and Rhythm: Normal rate and regular rhythm.  Pulmonary:     Effort: Pulmonary effort is normal.     Breath sounds: Normal breath sounds.  Abdominal:     General: Bowel sounds are normal. There is no distension.     Palpations: There is no mass.     Tenderness: There is no abdominal tenderness. There is no guarding or rebound.     Hernia: No hernia is present.  Skin:    General: Skin is warm and dry.  Neurological:     Mental Status: He is alert and oriented to person, place, and time.  Psychiatric:        Mood and Affect: Mood normal.        Behavior: Behavior normal.    BP 108/64 (BP Location: Left Arm, Patient Position: Sitting, Cuff Size: Normal)   Pulse 71   Temp (!) 97.4 F (36.3 C) (Temporal)   Ht 6' 3"  (1.905 m)   Wt 221 lb 12.8 oz (100.6 kg)   SpO2 94%   BMI 27.72 kg/m  Wt Readings from Last 3 Encounters:  05/26/21 221 lb 12.8 oz (100.6 kg)  05/20/21 219 lb  (99.3 kg)  05/08/21 214 lb (97.1 kg)     Health Maintenance Due  Topic Date Due   Zoster Vaccines- Shingrix (1 of 2) Never done    There are no preventive care reminders to display for this patient.  Lab Results  Component Value Date   TSH 1.870 05/20/2020   Lab Results  Component Value Date   WBC 6.0 11/22/2020   HGB 14.3 11/22/2020   HCT 42.7 11/22/2020   MCV 86.1 11/22/2020   PLT 251.0 11/22/2020   Lab Results  Component Value Date   NA 137 02/11/2021   K 4.1 02/11/2021   CO2 24 02/11/2021   GLUCOSE 112 (H) 02/11/2021   BUN 22 02/11/2021   CREATININE 1.32 02/11/2021   BILITOT 0.7 11/22/2020   ALKPHOS 57 11/22/2020   AST 20 11/22/2020   ALT 23 11/22/2020   PROT 6.6 11/22/2020   ALBUMIN 4.4 11/22/2020   CALCIUM 9.7 02/11/2021   ANIONGAP 9 03/02/2017   GFR 50.16 (L) 02/11/2021   Lab Results  Component Value Date   CHOL 159 11/22/2020   Lab Results  Component Value Date   HDL 45.80 11/22/2020   Lab Results  Component Value Date   LDLCALC 89 11/22/2020   Lab Results  Component Value Date   TRIG 124.0 11/22/2020   Lab Results  Component Value Date   CHOLHDL 3 11/22/2020   Lab Results  Component Value Date   HGBA1C 5.7 10/11/2007  Assessment & Plan:   Problem List Items Addressed This Visit       Cardiovascular and Mediastinum   Essential hypertension - Primary   Relevant Orders   Basic metabolic panel   Urinalysis, Routine w reflex microscopic   Carotid artery disease (HCC)   Relevant Orders   Lipid panel   LDL cholesterol, direct     Other   Elevated LDL cholesterol level   Relevant Orders   Lipid panel   LDL cholesterol, direct   Bloating   Relevant Medications   simethicone (GAS-X) 80 MG chewable tablet   Other Visit Diagnoses     Healthcare maintenance       Relevant Orders   Flu vaccine HIGH DOSE PF (Fluzone High dose) (Completed)       Meds ordered this encounter  Medications   simethicone (GAS-X) 80 MG  chewable tablet    Sig: Chew 1 tablet (80 mg total) by mouth every 6 (six) hours as needed for flatulence.    Dispense:  30 tablet    Refill:  0     Follow-up: Return in about 6 months (around 11/23/2021), or May need higher dose of Lipitor..  Discussed increasing Lipitor to 80 mg daily.  So he problems with it.  FODMAP diet and advised to try Gas-X.  I have explained vaccine today.  Libby Maw, MD

## 2021-05-27 ENCOUNTER — Telehealth (HOSPITAL_COMMUNITY): Payer: Self-pay

## 2021-05-27 NOTE — Telephone Encounter (Signed)
Spoke with the patient's wife. She stated that she would give him the instructions. Asked to call with any questions. S.Pricila Bridge EMTP

## 2021-05-29 ENCOUNTER — Ambulatory Visit (HOSPITAL_COMMUNITY): Payer: Federal, State, Local not specified - PPO | Attending: Cardiovascular Disease

## 2021-05-29 ENCOUNTER — Other Ambulatory Visit: Payer: Self-pay

## 2021-05-29 DIAGNOSIS — I2510 Atherosclerotic heart disease of native coronary artery without angina pectoris: Secondary | ICD-10-CM | POA: Insufficient documentation

## 2021-05-29 DIAGNOSIS — I2583 Coronary atherosclerosis due to lipid rich plaque: Secondary | ICD-10-CM | POA: Diagnosis not present

## 2021-05-29 DIAGNOSIS — R072 Precordial pain: Secondary | ICD-10-CM | POA: Diagnosis not present

## 2021-05-29 DIAGNOSIS — I10 Essential (primary) hypertension: Secondary | ICD-10-CM

## 2021-05-29 LAB — MYOCARDIAL PERFUSION IMAGING
LV dias vol: 64 mL (ref 62–150)
LV sys vol: 27 mL
Nuc Stress EF: 58 %
Peak HR: 78 {beats}/min
Rest HR: 65 {beats}/min
Rest Nuclear Isotope Dose: 10.8 mCi
SDS: 0
SRS: 0
SSS: 0
ST Depression (mm): 0 mm
Stress Nuclear Isotope Dose: 30.4 mCi
TID: 0.99

## 2021-05-29 MED ORDER — TECHNETIUM TC 99M TETROFOSMIN IV KIT
10.8000 | PACK | Freq: Once | INTRAVENOUS | Status: AC | PRN
  Administered 2021-05-29: 10.8 via INTRAVENOUS
  Filled 2021-05-29: qty 11

## 2021-05-29 MED ORDER — REGADENOSON 0.4 MG/5ML IV SOLN
0.4000 mg | Freq: Once | INTRAVENOUS | Status: AC
  Administered 2021-05-29: 0.4 mg via INTRAVENOUS

## 2021-05-29 MED ORDER — TECHNETIUM TC 99M TETROFOSMIN IV KIT
30.4000 | PACK | Freq: Once | INTRAVENOUS | Status: AC | PRN
Start: 2021-05-29 — End: 2021-05-29
  Administered 2021-05-29: 30.4 via INTRAVENOUS
  Filled 2021-05-29: qty 31

## 2021-06-02 ENCOUNTER — Telehealth: Payer: Self-pay | Admitting: Family Medicine

## 2021-06-02 ENCOUNTER — Other Ambulatory Visit: Payer: Self-pay | Admitting: Cardiology

## 2021-06-02 NOTE — Telephone Encounter (Signed)
Pt called returning Tequila's call to discuss labs.

## 2021-07-02 ENCOUNTER — Other Ambulatory Visit: Payer: Self-pay | Admitting: Vascular Surgery

## 2021-07-02 MED ORDER — LORAZEPAM 1 MG PO TABS: 1.0000 mg | ORAL_TABLET | Freq: Once | ORAL | 0 refills | Status: AC

## 2021-07-02 NOTE — Progress Notes (Signed)
Ativan prescription sent

## 2021-07-09 ENCOUNTER — Other Ambulatory Visit: Payer: Self-pay

## 2021-07-09 ENCOUNTER — Encounter: Payer: Self-pay | Admitting: Vascular Surgery

## 2021-07-09 ENCOUNTER — Ambulatory Visit (INDEPENDENT_AMBULATORY_CARE_PROVIDER_SITE_OTHER): Payer: Federal, State, Local not specified - PPO | Admitting: Vascular Surgery

## 2021-07-09 VITALS — BP 105/59 | HR 66 | Temp 97.9°F | Resp 16 | Ht 76.0 in | Wt 220.0 lb

## 2021-07-09 DIAGNOSIS — I872 Venous insufficiency (chronic) (peripheral): Secondary | ICD-10-CM

## 2021-07-09 DIAGNOSIS — I83812 Varicose veins of left lower extremities with pain: Secondary | ICD-10-CM | POA: Diagnosis not present

## 2021-07-09 HISTORY — PX: ENDOVENOUS ABLATION SAPHENOUS VEIN W/ LASER: SUR449

## 2021-07-09 NOTE — Progress Notes (Signed)
     Laser Ablation Procedure    Date: 07/09/2021   Kristopher Wilson DOB:November 27, 1935  Consent signed: Yes      Surgeon: Gae Gallop MD   Procedure: Laser Ablation: left Greater Saphenous Vein  BP (!) 105/59 (BP Location: Left Arm, Patient Position: Sitting, Cuff Size: Normal)   Pulse 66   Temp 97.9 F (36.6 C)   Resp 16   Ht 6' 4"  (1.93 m)   Wt 220 lb (99.8 kg)   SpO2 96%   BMI 26.78 kg/m   Tumescent Anesthesia: 400 cc 0.9% NaCl with 50 cc Lidocaine HCL 1%  and 15 cc 8.4% NaHCO3  Local Anesthesia: 3 cc Lidocaine HCL and NaHCO3 (ratio 2:1)  7 watts continuous mode     Total energy: 2141 Joules    Total time: 305 seconds  Treatment Length  50 cm   Laser Fiber Ref. #  [id number]     Lot # [id number]   Stab Phlebectomy: 10-20 Sites: Thigh and Calf  Patient tolerated procedure well  Notes: Patient wore face mask.  All staff members wore facial masks and facial shields/goggles.  [name] took Ativan 1 mg on 07-09-2021 at 7:30 AM.    Description of Procedure:  After marking the course of the secondary varicosities, the patient was placed on the operating table in the supine position, and the left leg was prepped and draped in sterile fashion.   Local anesthetic was administered and under ultrasound guidance the saphenous vein was accessed with a micro needle and guide wire; then the mirco puncture sheath was placed.  A guide wire was inserted saphenofemoral junction , followed by a 5 french sheath.  The position of the sheath and then the laser fiber below the junction was confirmed using the ultrasound.  Tumescent anesthesia was administered along the course of the saphenous vein using ultrasound guidance. The patient was placed in Trendelenburg position and protective laser glasses were placed on patient and staff, and the laser was fired at 7 watts continuous mode for a total of 2141 joules.   For stab phlebectomies, local anesthetic was administered at the previously marked  varicosities, and tumescent anesthesia was administered around the vessels.  Ten to 20 stab wounds were made using the tip of an 11 blade. And using the vein hook, the phlebectomies were performed using a hemostat to avulse the varicosities.  Adequate hemostasis was achieved.     Steri strips were applied to the stab wounds and ABD pads and thigh high compression stockings were applied.  Ace wrap bandages were applied over the phlebectomy sites and at the top of the saphenofemoral junction. Blood loss was less than 15 cc.  Discharge instructions reviewed with patient and hardcopy of discharge instructions given to patient to take home. The patient ambulated out of the operating room having tolerated the procedure well.

## 2021-07-09 NOTE — Progress Notes (Signed)
Patient name: Kristopher Wilson MRN: [id number] DOB: Oct 04, 1935 Sex: male  REASON FOR VISIT: For laser ablation of the left great saphenous vein and 10-20 stabs  HPI: Kristopher Wilson is a 85 y.o. male who I last saw on 05/08/2021.  He has CEAP C4a venous disease.  He has symptoms from venous hypertension.  He had failed conservative treatment including leg elevation and compression stockings.  He had reflux in his left great saphenous vein down to the proximal calf but it was especially large in the thigh before it gave off a large tributary.  Below the takeoff of this large tributary the vein was more difficult to follow.  Current Outpatient Medications  Medication Sig Dispense Refill   amLODipine (NORVASC) 10 MG tablet Take 1 tablet (10 mg total) by mouth daily. 90 tablet 3   aspirin 81 MG tablet Take 81 mg by mouth daily.     atorvastatin (LIPITOR) 40 MG tablet Take 1 tablet (40 mg total) by mouth daily. 90 tablet 3   carvedilol (COREG) 12.5 MG tablet Take 1 tablet (12.5 mg total) by mouth daily. 90 tablet 3   clopidogrel (PLAVIX) 75 MG tablet TAKE 1 TABLET DAILY 90 tablet 3   fenofibrate (TRICOR) 145 MG tablet Take 1 tablet (145 mg total) by mouth daily. 90 tablet 3   hydrochlorothiazide (HYDRODIURIL) 25 MG tablet Take 1 tablet (25 mg total) by mouth daily. 90 tablet 3   isosorbide mononitrate (IMDUR) 60 MG 24 hr tablet TAKE 1 TABLET BY MOUTH EVERY DAY 90 tablet 2   meclizine (ANTIVERT) 12.5 MG tablet Take 1 tablet (12.5 mg total) by mouth 2 (two) times daily as needed for dizziness. 30 tablet 0   Mesalamine 800 MG TBEC TAKE 3 TABLETS (2,400MG    TOTAL) TWO TIMES A DAY 180 tablet 5   nitroGLYCERIN (NITROSTAT) 0.4 MG SL tablet Place 1 tablet (0.4 mg total) under the tongue every 5 (five) minutes as needed for chest pain. 25 tablet 6   pantoprazole (PROTONIX) 40 MG tablet Take 1 tablet (40 mg total) by mouth daily. 90 tablet 3   pramipexole (MIRAPEX) 1 MG tablet Take 1 tablet (1 mg total) by  mouth at bedtime. 90 tablet 1   simethicone (GAS-X) 80 MG chewable tablet Chew 1 tablet (80 mg total) by mouth every 6 (six) hours as needed for flatulence. 30 tablet 0   Current Facility-Administered Medications  Medication Dose Route Frequency Provider Last Rate Last Admin   ipratropium-albuterol (DUONEB) 0.5-2.5 (3) MG/3ML nebulizer solution 3 mL  3 mL Nebulization Once Nafziger, Cory, NP        PHYSICAL EXAM: Vitals:   07/09/21 0848  BP: (!) 105/59  Pulse: 66  Resp: 16  Temp: 97.9 F (36.6 C)  SpO2: 96%  Weight: 220 lb (99.8 kg)  Height: 6' 4"  (1.93 m)    PROCEDURE: Laser ablation left great saphenous vein with 10-20 stabs  TECHNIQUE: The patient was taken to the exam room and the dilated varicose veins were marked with the patient standing.  He was then placed supine.  I looked at the left great saphenous vein myself with the SonoSite and I felt that I could cannulate this in the proximal calf.  The left leg was prepped and draped in usual sterile fashion.  Under ultrasound guidance, after the skin was anesthetized, I cannulated the left great saphenous vein in the proximal calf with a micropuncture needle and a micropuncture sheath was introduced over a wire.  I then advanced the J-wire to just below the saphenofemoral junction.  I then advanced the 65 cm sheath over the wire and positioned this below the saphenofemoral junction.  The wire and dilator were removed.  I then positioned the laser fiber at the end of the sheath and the sheath was retracted.  Again this was positioned 2-1/2 cm distal to the saphenofemoral junction.  Tumescent anesthesia was then administered circumferentially around the vein.  Laser ablation was then performed of the left great saphenous vein after laser glasses were placed.  I used 50 J/cm at 7 W.  50 cm of vein were treated.  Next attention was turned to stab phlebectomies.  All the marked areas were anesthetized with 1% lidocaine.  I then made  approximately 15 small stab incisions with an 11 blade.  The vein was "brought above the skin and then bluntly excised.  I was able to get 1 segment that was about 15 to 20 cm in length.  At the completion Steri-Strips were applied and a pressure dressing was applied.  Patient tolerated the procedure well.  He will return in 1 week for a follow-up duplex.  Deitra Mayo [facility] [phone]

## 2021-07-16 ENCOUNTER — Encounter: Payer: Self-pay | Admitting: Vascular Surgery

## 2021-07-16 ENCOUNTER — Ambulatory Visit (INDEPENDENT_AMBULATORY_CARE_PROVIDER_SITE_OTHER): Payer: Federal, State, Local not specified - PPO | Admitting: Vascular Surgery

## 2021-07-16 ENCOUNTER — Other Ambulatory Visit: Payer: Self-pay

## 2021-07-16 ENCOUNTER — Ambulatory Visit (HOSPITAL_COMMUNITY)
Admission: RE | Admit: 2021-07-16 | Discharge: 2021-07-16 | Disposition: A | Discharge status: Home / Self Care | Payer: Federal, State, Local not specified - PPO | Source: Ambulatory Visit | Attending: Vascular Surgery | Admitting: Vascular Surgery

## 2021-07-16 VITALS — BP 136/80 | HR 69 | Temp 97.7°F | Resp 16 | Ht 75.0 in | Wt 220.0 lb

## 2021-07-16 DIAGNOSIS — I83812 Varicose veins of left lower extremities with pain: Secondary | ICD-10-CM

## 2021-07-16 NOTE — Progress Notes (Signed)
Patient name: Kristopher Wilson MRN: [id number] DOB: 1936/06/08 Sex: male  REASON FOR VISIT: Follow-up after laser ablation of the left great saphenous vein  HPI: Kristopher Wilson is a 85 y.o. male who had presented with painful varicose veins of the left lower extremity.  He had C4 disease.  He underwent laser ablation of the left great saphenous vein down to the proximal calf with 10-20 stab phlebectomies.  He comes in for a follow-up visit.  He has no specific complaints.  He is anxious to get back on the golf course.  He has been wearing his thigh-high stockings.  Current Outpatient Medications  Medication Sig Dispense Refill   amLODipine (NORVASC) 10 MG tablet Take 1 tablet (10 mg total) by mouth daily. 90 tablet 3   aspirin 81 MG tablet Take 81 mg by mouth daily.     atorvastatin (LIPITOR) 40 MG tablet Take 1 tablet (40 mg total) by mouth daily. 90 tablet 3   carvedilol (COREG) 12.5 MG tablet Take 1 tablet (12.5 mg total) by mouth daily. 90 tablet 3   clopidogrel (PLAVIX) 75 MG tablet TAKE 1 TABLET DAILY 90 tablet 3   fenofibrate (TRICOR) 145 MG tablet Take 1 tablet (145 mg total) by mouth daily. 90 tablet 3   hydrochlorothiazide (HYDRODIURIL) 25 MG tablet Take 1 tablet (25 mg total) by mouth daily. 90 tablet 3   isosorbide mononitrate (IMDUR) 60 MG 24 hr tablet TAKE 1 TABLET BY MOUTH EVERY DAY 90 tablet 2   meclizine (ANTIVERT) 12.5 MG tablet Take 1 tablet (12.5 mg total) by mouth 2 (two) times daily as needed for dizziness. 30 tablet 0   Mesalamine 800 MG TBEC TAKE 3 TABLETS (2,400MG    TOTAL) TWO TIMES A DAY 180 tablet 5   nitroGLYCERIN (NITROSTAT) 0.4 MG SL tablet Place 1 tablet (0.4 mg total) under the tongue every 5 (five) minutes as needed for chest pain. 25 tablet 6   pantoprazole (PROTONIX) 40 MG tablet Take 1 tablet (40 mg total) by mouth daily. 90 tablet 3   pramipexole (MIRAPEX) 1 MG tablet Take 1 tablet (1 mg total) by mouth at bedtime. 90 tablet 1   simethicone (GAS-X) 80 MG  chewable tablet Chew 1 tablet (80 mg total) by mouth every 6 (six) hours as needed for flatulence. 30 tablet 0   Current Facility-Administered Medications  Medication Dose Route Frequency Provider Last Rate Last Admin   ipratropium-albuterol (DUONEB) 0.5-2.5 (3) MG/3ML nebulizer solution 3 mL  3 mL Nebulization Once Nafziger, Tommi Rumps, NP       REVIEW OF SYSTEMS: Valu.Nieves ] denotes positive finding; [  ] denotes negative finding  CARDIOVASCULAR:  [ ]  chest pain   [ ]  dyspnea on exertion  [ ]  leg swelling  CONSTITUTIONAL:  [ ]  fever   [ ]  chills  PHYSICAL EXAM: Vitals:   07/16/21 1054  BP: 136/80  Pulse: 69  Resp: 16  Temp: 97.7 F (36.5 C)  TempSrc: Temporal  SpO2: 97%  Weight: 220 lb (99.8 kg)  Height: 6' 3"  (1.905 m)   GENERAL: The patient is a well-nourished male, in no acute distress. The vital signs are documented above. CARDIOVASCULAR: There is a regular rate and rhythm. PULMONARY: There is good air exchange bilaterally without wheezing or rales. VASCULAR: He has some mild bruising in the medial distal left thigh.  Stab incisions are healing nicely.  DATA:  VENOUS DUPLEX: I have independently interpreted his venous duplex scan today.  There is no  evidence of DVT in the left lower extremity.  The left great saphenous vein is successfully closed from 6 mm distal to the saphenofemoral junction to the proximal calf.  MEDICAL ISSUES:  S/P LASER ABLATION LEFT GREAT SAPHENOUS VEIN WITH STAB PHLEBECTOMIES: Patient is doing well and has no specific complaints.  He has 1 more week with the thigh-high compression stocking.  I encouraged him to continue to wear stockings when he is standing and sitting after that.  A knee-high stocking may be more practical.  We have also discussed the importance of daily leg elevation.  I have encouraged him to stay as active as possible.  I will see him back as needed.  Deitra Mayo [facility] [phone]

## 2021-07-24 ENCOUNTER — Other Ambulatory Visit: Payer: Self-pay

## 2021-07-24 MED ORDER — ATORVASTATIN CALCIUM 40 MG PO TABS: 40.0000 mg | ORAL_TABLET | Freq: Every day | ORAL | 3 refills | Status: DC

## 2021-07-24 MED ORDER — FENOFIBRATE 145 MG PO TABS: 145.0000 mg | ORAL_TABLET | Freq: Every day | ORAL | 3 refills | Status: DC

## 2021-08-06 ENCOUNTER — Telehealth: Payer: Self-pay | Admitting: Internal Medicine

## 2021-08-06 ENCOUNTER — Other Ambulatory Visit: Payer: Self-pay | Admitting: Internal Medicine

## 2021-08-06 NOTE — Telephone Encounter (Signed)
Medicine refilled and patient informed.

## 2021-08-06 NOTE — Telephone Encounter (Signed)
Patient called and made an appointment for January.

## 2021-08-06 NOTE — Telephone Encounter (Signed)
Patient was told he needed to schedule an appointment before receiving refills.  Patient has an appointment scheduled with Dr. Darnell Level. on 09/30/21 at 10:50 a.m.  He has about a week of medication left.  Is it possible to call him in enough to last him until his January appointment?  Please let patient know.  Thank you.

## 2021-08-06 NOTE — Telephone Encounter (Signed)
I have left detailed messages on both of his #'s to call for appointment and then we can send in his medicine to cover until his appointment. He's overdue, last seen in 2020.

## 2021-09-08 ENCOUNTER — Other Ambulatory Visit: Payer: Self-pay | Admitting: Adult Health

## 2021-09-08 DIAGNOSIS — K219 Gastro-esophageal reflux disease without esophagitis: Secondary | ICD-10-CM

## 2021-09-10 ENCOUNTER — Other Ambulatory Visit: Payer: Self-pay | Admitting: Family Medicine

## 2021-09-10 DIAGNOSIS — K219 Gastro-esophageal reflux disease without esophagitis: Secondary | ICD-10-CM

## 2021-09-30 ENCOUNTER — Ambulatory Visit: Payer: Federal, State, Local not specified - PPO | Admitting: Internal Medicine

## 2021-09-30 ENCOUNTER — Encounter: Payer: Self-pay | Admitting: Internal Medicine

## 2021-09-30 VITALS — BP 102/60 | HR 70 | Ht 75.0 in | Wt 232.0 lb

## 2021-09-30 DIAGNOSIS — R143 Flatulence: Secondary | ICD-10-CM | POA: Diagnosis not present

## 2021-09-30 DIAGNOSIS — K5150 Left sided colitis without complications: Secondary | ICD-10-CM

## 2021-09-30 MED ORDER — MESALAMINE 800 MG PO TBEC: DELAYED_RELEASE_TABLET | ORAL | 11 refills | Status: DC

## 2021-09-30 NOTE — Patient Instructions (Signed)
We are providing you with a gas handout book to read. Try to cut out some of the gas causing foods and see if it helps.  We have sent the following medications to your pharmacy for you to pick up at your convenience: Mesalamine   I appreciate the opportunity to care for you. Silvano Rusk, MD, [facility]

## 2021-09-30 NOTE — Progress Notes (Signed)
Visit       Kristopher Wilson 86 y.o. [birth year] [id number]  Assessment & Plan:   Encounter Diagnoses  Name Primary?   ULCERATIVE COLITIS, LEFT SIDED Yes   Flatulence symptom   Patient is doing well we will continue his mesalamine it is refilled.  He can return in 2 years sooner as needed.  I am okay to refill at 1 year as long as no changes are noted.       Subjective:   Chief Complaint: Follow-up of ulcerative colitis  HPI 86 year old white man with history of left-sided ulcerative colitis last seen in April 2020 (telehealth) here to have an update and to refill medications.  He thinks he is gassy but is not having any bleeding or diarrhea.  He has elected to stop surveillance colonoscopy for polyps (3 lifetime adenomas) and his colitis.  CBC in March 2022 was normal, he had a be met in the fall with mildly decreased GFR. Allergies  Allergen Reactions   Cephalexin Hives   Current Meds  Medication Sig   amLODipine (NORVASC) 10 MG tablet Take 1 tablet (10 mg total) by mouth daily.   aspirin 81 MG tablet Take 81 mg by mouth daily.   atorvastatin (LIPITOR) 40 MG tablet Take 1 tablet (40 mg total) by mouth daily.   carvedilol (COREG) 12.5 MG tablet Take 1 tablet (12.5 mg total) by mouth daily.   clopidogrel (PLAVIX) 75 MG tablet TAKE 1 TABLET DAILY   fenofibrate (TRICOR) 145 MG tablet Take 1 tablet (145 mg total) by mouth daily.   hydrochlorothiazide (HYDRODIURIL) 25 MG tablet Take 1 tablet (25 mg total) by mouth daily.   isosorbide mononitrate (IMDUR) 60 MG 24 hr tablet TAKE 1 TABLET BY MOUTH EVERY DAY   meclizine (ANTIVERT) 12.5 MG tablet Take 1 tablet (12.5 mg total) by mouth 2 (two) times daily as needed for dizziness.   nitroGLYCERIN (NITROSTAT) 0.4 MG SL tablet Place 1 tablet (0.4 mg total) under the tongue every 5 (five) minutes as needed for chest pain.   pantoprazole (PROTONIX) 40 MG tablet TAKE ONE TABLET BY MOUTH ONE TIME DAILY   pramipexole (MIRAPEX) 1 MG tablet Take 1  tablet (1 mg total) by mouth at bedtime.   simethicone (GAS-X) 80 MG chewable tablet Chew 1 tablet (80 mg total) by mouth every 6 (six) hours as needed for flatulence.   [DISCONTINUED] Mesalamine 800 MG TBEC TAKE 3 TABLETS (2,400MG    TOTAL) TWO TIMES A DAY   Current Facility-Administered Medications for the 09/30/21 encounter (Office Visit) with Gatha Mayer, MD  Medication   ipratropium-albuterol (DUONEB) 0.5-2.5 (3) MG/3ML nebulizer solution 3 mL   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   PROSTATE CANCER, HX OF 07/07/2007   s/p prostatectomy   RLS (restless legs syndrome)  S/P skin biopsy 06/12/2019   Ulcer    ULCERATIVE COLITIS, LEFT SIDED 2000   URTICARIA, ALLERGIC 08/05/2009   VENOUS INSUFFICIENCY 07/28/2010   Past Surgical History:  Procedure Laterality Date   ANGIOPLASTY  1990's/2004   stent placement 2   COLONOSCOPY W/ BIOPSIES AND POLYPECTOMY  06/06/2009   adenomatous polyp, diverticulosis, colitis   ENDOVENOUS ABLATION SAPHENOUS VEIN W/ LASER Left 07/09/2021   endovenous laser ablation left greater saphenous vein and stab phlebectomy 10-20 incisions left leg by Gae Gallop MD   INGUINAL HERNIA REPAIR     x x   ORIF FIBULA FRACTURE Left 03/02/2017   Procedure: OPEN REDUCTION INTERNAL FIXATION (ORIF) LEFT DISTAL FIBULA FRACTURE WITH DELTOID LIGAMENT REPAIR;  Surgeon: [name], MD;   Location: [place];  Service: Orthopedics;  Laterality: Left;   PROSTATECTOMY     RETINAL LASER PROCEDURE Right 2016 & 2017    ROTATOR CUFF REPAIR Left    SKIN BIOPSY  02/26/2020   Left Dorsum Bridge of Nose and Right Upper Abdomen   TONSILLECTOMY     Social History   Social History Narrative   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 All live in [place] area   Works part time at [employer]   family history includes Diabetes in his maternal uncle and maternal uncle; Heart attack in his father; Multiple sclerosis in his brother and son; Throat cancer in his brother.   Review of Systems As per HPI  Objective:   Physical Exam @BP  102/60    Pulse 70    Ht 6' 3"  (1.905 m)    Wt 232 lb (105.2 kg)    SpO2 97%    BMI 29.00 kg/m @  General:  NAD Eyes:   anicteric Lungs:  clear Heart::  S1S2 no rubs, murmurs or gallops Abdomen:  soft and nontender, BS+ Ext:   no edema, cyanosis or clubbing

## 2021-10-21 ENCOUNTER — Ambulatory Visit: Payer: Federal, State, Local not specified - PPO | Admitting: Podiatry

## 2021-10-27 ENCOUNTER — Other Ambulatory Visit: Payer: Self-pay | Admitting: Family Medicine

## 2021-10-27 DIAGNOSIS — G2581 Restless legs syndrome: Secondary | ICD-10-CM

## 2021-10-30 ENCOUNTER — Other Ambulatory Visit: Payer: Self-pay | Admitting: Cardiology

## 2021-10-30 ENCOUNTER — Encounter (INDEPENDENT_AMBULATORY_CARE_PROVIDER_SITE_OTHER): Payer: Self-pay | Admitting: Podiatry

## 2021-10-30 DIAGNOSIS — M778 Other enthesopathies, not elsewhere classified: Secondary | ICD-10-CM

## 2021-10-30 NOTE — Progress Notes (Signed)
NO SHOW. This encounter was created in error - please disregard.

## 2021-11-06 ENCOUNTER — Ambulatory Visit (INDEPENDENT_AMBULATORY_CARE_PROVIDER_SITE_OTHER): Payer: Federal, State, Local not specified - PPO | Admitting: Podiatry

## 2021-11-06 ENCOUNTER — Other Ambulatory Visit: Payer: Self-pay

## 2021-11-06 ENCOUNTER — Encounter: Payer: Self-pay | Admitting: Podiatry

## 2021-11-06 ENCOUNTER — Ambulatory Visit (INDEPENDENT_AMBULATORY_CARE_PROVIDER_SITE_OTHER): Payer: Federal, State, Local not specified - PPO

## 2021-11-06 ENCOUNTER — Other Ambulatory Visit: Payer: Self-pay | Admitting: Podiatry

## 2021-11-06 DIAGNOSIS — G5782 Other specified mononeuropathies of left lower limb: Secondary | ICD-10-CM

## 2021-11-06 DIAGNOSIS — S93492A Sprain of other ligament of left ankle, initial encounter: Secondary | ICD-10-CM

## 2021-11-06 DIAGNOSIS — M778 Other enthesopathies, not elsewhere classified: Secondary | ICD-10-CM

## 2021-11-06 MED ORDER — TRIAMCINOLONE ACETONIDE 40 MG/ML IJ SUSP
20.0000 mg | Freq: Once | INTRAMUSCULAR | Status: AC
  Administered 2021-11-06: 20 mg

## 2021-11-06 NOTE — Progress Notes (Signed)
He presents today for chief complaint of radiating electrical pain at the plantar lateral aspect of his left foot he states that is not at all times he says he can just be walking or sitting or driving and is getting electrical pain that only last a few seconds he states that he has had shingles in that leg that has resulted in numbness in his foot.  He also still wears compression socks even after he had his vein fixed by Dr. Doren Custard and vein and vascular.  He feels that his ankle is weak and he feels that he rolls his ankle all the time because of the fracture that he had few years ago. ? ?Objective: Vital signs are stable he is alert and oriented x3 pulses are palpable.  Neurologic sensorium seems to be diminished per Semmes Weinstein monofilament deep tendon reflexes are lost.  He has no pain on palpation to the third interdigital space of the left foot but there is a palpable Mulder's click.  There is pitting edema bilateral left greater than right.  He does have a slightly inverted foot at the subtalar joint and after radiographs are taken is obvious that there is some inversion of the ankle. ? ?Radiographs taken today demonstrate an osseously mature individual with osteoarthritis joint space narrowing subchondral sclerosis of the ankle joint.  Plate and screws are still present in the fibula distally but all of this appears to have gone on to heal uneventfully he has some osteoarthritic changes throughout the foot and some mild osteopenia. ? ?Assessment: Most likely the electrical sensation is neuropathic in nature associated with the neuropathy from the shingles.  It very well could be from that palpable neuroma in the third interdigital space.  Also lateral ankle instability secondary to his fracture and chronic sprains. ? ?Plan: Discussed etiology pathology conservative versus surgical therapies at this point we will place him in a Tri-Lock brace I injected his third interdigital space today with 10 mg of  Kenalog and 5 mg of Marcaine.  He understands this and was amenable to it he is happy to try this and we did discuss appropriate shoe gear.  I will follow-up with him in about 2 months should he have questions or concerns he will notify us immediately. ?

## 2021-11-25 ENCOUNTER — Ambulatory Visit (INDEPENDENT_AMBULATORY_CARE_PROVIDER_SITE_OTHER): Payer: Federal, State, Local not specified - PPO

## 2021-11-25 ENCOUNTER — Other Ambulatory Visit: Payer: Self-pay

## 2021-11-25 ENCOUNTER — Ambulatory Visit: Payer: Federal, State, Local not specified - PPO | Admitting: Family Medicine

## 2021-11-25 ENCOUNTER — Encounter: Payer: Self-pay | Admitting: Family Medicine

## 2021-11-25 VITALS — BP 123/78 | HR 63 | Temp 97.2°F | Ht 75.0 in | Wt 227.4 lb

## 2021-11-25 DIAGNOSIS — E7800 Pure hypercholesterolemia, unspecified: Secondary | ICD-10-CM | POA: Diagnosis not present

## 2021-11-25 DIAGNOSIS — R7309 Other abnormal glucose: Secondary | ICD-10-CM | POA: Diagnosis not present

## 2021-11-25 DIAGNOSIS — M19042 Primary osteoarthritis, left hand: Secondary | ICD-10-CM

## 2021-11-25 DIAGNOSIS — I10 Essential (primary) hypertension: Secondary | ICD-10-CM | POA: Diagnosis not present

## 2021-11-25 DIAGNOSIS — I25119 Atherosclerotic heart disease of native coronary artery with unspecified angina pectoris: Secondary | ICD-10-CM

## 2021-11-25 DIAGNOSIS — M19041 Primary osteoarthritis, right hand: Secondary | ICD-10-CM

## 2021-11-25 LAB — LIPID PANEL
Cholesterol: 156 mg/dL (ref 0–200)
HDL: 48.8 mg/dL (ref >39.00)
LDL Cholesterol: 74 mg/dL (ref 0–99)
NonHDL: 106.79
Total CHOL/HDL Ratio: 3
Triglycerides: 166 mg/dL — ABNORMAL HIGH (ref 0.0–149.0)
VLDL: 33.2 mg/dL (ref 0.0–40.0)

## 2021-11-25 LAB — BASIC METABOLIC PANEL
BUN: 23 mg/dL (ref 6–23)
CO2: 28 mEq/L (ref 19–32)
Calcium: 9.4 mg/dL (ref 8.4–10.5)
Chloride: 100 mEq/L (ref 96–112)
Creatinine, Ser: 1.42 mg/dL (ref 0.40–1.50)
GFR: 45.69 mL/min — ABNORMAL LOW (ref >60.00)
Glucose, Bld: 122 mg/dL — ABNORMAL HIGH (ref 70–99)
Potassium: 3.8 mEq/L (ref 3.5–5.1)
Sodium: 136 mEq/L (ref 135–145)

## 2021-11-25 LAB — HEMOGLOBIN A1C: Hgb A1c MFr Bld: 6.7 % — ABNORMAL HIGH (ref 4.6–6.5)

## 2021-11-25 MED ORDER — DICLOFENAC SODIUM 1 % EX GEL: CUTANEOUS | 1 refills | Status: DC

## 2021-11-25 NOTE — Progress Notes (Signed)
? ?Established Patient Office Visit ? ?Subjective:  ?Patient ID: Kristopher Wilson, male    DOB: 12-15-1935  Age: 86 y.o. MRN: [id number] ? ?CC:  ?Chief Complaint  ?Patient presents with  ? Follow-up  ?  Follow up Pt c/o pain thumbs on both hand x 2 or 3 weeks  ? ? ?HPI ?Kristopher Wilson presents for follow-up of elevated cholesterol and triglycerides, elevated glucose and pain in bases of both of his thumbs.  Denies injury.  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  Right-hand-dominant.  Claims compliance with atorvastatin and Tricor.  History of vascular disease involving his heart.  LDL C holesterol last measure was 94.  He does enjoy red meat. ? ?Past Medical History:  ?Diagnosis Date  ? BACK PAIN, LUMBAR 10/18/2009  ? Benign paroxysmal positional vertigo 09/30/2007  ? CAD (coronary artery disease)   ? Cancer [facility])   ? prostate   ? CHF (congestive heart failure) ([place])   ? CKD (chronic kidney disease)   ? COLONIC POLYPS, ADENOMATOUS, HX OF   ? GERD (gastroesophageal reflux disease)   ? Herpes zoster with other nervous system complications(053.19)   ? Hyperlipidemia   ? Hypertension   ? Hyperthyroidism   ? pt denies  ? Loss of hearing   ? Bil/has hearing aids in both ears  ? Nerve damage of left foot   ? PERIPHERAL NEUROPATHY 10/11/2007  ? 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 PROSTATE CANCER, HX OF 07/07/2007  ? s/p prostatectomy  ? RLS (restless legs syndrome)   ? S/P skin biopsy 06/12/2019  ? Ulcer   ? ULCERATIVE COLITIS, LEFT SIDED 2000  ? URTICARIA, ALLERGIC 08/05/2009  ? VENOUS INSUFFICIENCY 07/28/2010  ? ? ?Past Surgical History:  ?Procedure Laterality Date  ? ANGIOPLASTY  1990's/2004  ? stent placement 2  ? COLONOSCOPY W/ BIOPSIES AND POLYPECTOMY  06/06/2009  ? adenomatous polyp, diverticulosis, colitis  ? ENDOVENOUS ABLATION SAPHENOUS VEIN W/ LASER Left 07/09/2021  ? endovenous laser ablation left greater saphenous vein and stab phlebectomy 10-20 incisions left leg by Gae Gallop MD  ? [place]    ? x x  ? ORIF FIBULA FRACTURE Left 03/02/2017  ? Procedure: OPEN REDUCTION INTERNAL FIXATION (ORIF) LEFT DISTAL FIBULA FRACTURE WITH DELTOID LIGAMENT REPAIR;  Surgeon: [name], MD;  Location: [facility];  Service: Orthopedics;  Laterality: Left;  ? PROSTATECTOMY    ? RETINAL LASER PROCEDURE Right 2016 & 2017   ? ROTATOR CUFF REPAIR Left   ? SKIN BIOPSY  02/26/2020  ? Left Dorsum Bridge of Nose and Right Upper Abdomen  ? TONSILLECTOMY    ? ? ?Family History  ?Problem Relation Age of Onset  ? Heart attack Father   ? Throat cancer Brother   ? Multiple sclerosis Brother   ? Multiple sclerosis Son   ? Diabetes Maternal Uncle   ?     x 2  ? Diabetes Maternal Uncle   ? Colon cancer Neg Hx   ? Stroke Neg Hx   ? ? ?Social History  ? ?Socioeconomic History  ? Marital status: Married  ?  Spouse name: [name]  ? Number of children: 3  ? Years of education: Not on file  ? Highest education level: Not on file  ?Occupational History  ? Occupation: retired  ?  Employer: RETIRED  ? Occupation: [employer]  ?Tobacco Use  ? Smoking status: Former  ?  Types: Cigarettes  ?  Quit  date: 09/08/1975  ?  Years since quitting: 46.2  ? Smokeless tobacco: Never  ?Vaping Use  ? Vaping Use: Never used  ?Substance and Sexual Activity  ? Alcohol use: Yes  ?  Alcohol/week: 15.0 standard drinks  ?  Types: 15 Standard drinks or equivalent per week  ?  Comment: 1-2 "high balls" daily  ? Drug use: No  ? Sexual activity: Not Currently  ?Other Topics Concern  ? Not on file  ?Social History Narrative  ? 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   ? Married   ? Two children, step son. All live in [place] area  ? Works part time at [employer]   ? Golf  ? ?Social Determinants of Health  ? ?Financial Resource Strain: Not on file  ?Food Insecurity: Not on file  ?Transportation Needs: Not on file  ?Physical Activity: Not on file  ?Stress: Not on file  ?Social Connections: Not on file  ?Intimate Partner Violence: Not on file   ? ? ?Outpatient Medications Prior to Visit  ?Medication Sig Dispense Refill  ? amLODipine (NORVASC) 10 MG tablet TAKE 1 TABLET BY MOUTH EVERY DAY 90 tablet 2  ? aspirin 81 MG tablet Take 81 mg by mouth daily.    ? atorvastatin (LIPITOR) 40 MG tablet Take 1 tablet (40 mg total) by mouth daily. 90 tablet 3  ? carvedilol (COREG) 12.5 MG tablet Take 1 tablet (12.5 mg total) by mouth daily. 90 tablet 3  ? clopidogrel (PLAVIX) 75 MG tablet TAKE 1 TABLET DAILY 90 tablet 3  ? fenofibrate (TRICOR) 145 MG tablet Take 1 tablet (145 mg total) by mouth daily. 90 tablet 3  ? hydrochlorothiazide (HYDRODIURIL) 25 MG tablet Take 1 tablet (25 mg total) by mouth daily. 90 tablet 3  ? isosorbide mononitrate (IMDUR) 60 MG 24 hr tablet TAKE 1 TABLET BY MOUTH EVERY DAY 90 tablet 2  ? LORazepam (ATIVAN) 1 MG tablet PLEASE SEE ATTACHED FOR DETAILED DIRECTIONS    ? meclizine (ANTIVERT) 12.5 MG tablet Take 1 tablet (12.5 mg total) by mouth 2 (two) times daily as needed for dizziness. 30 tablet 0  ? Mesalamine 800 MG TBEC TAKE 3 TABLETS (2,400MG    TOTAL) TWO TIMES A DAY 180 tablet 11  ? NAFTIN 2 % GEL Apply 1 application topically daily.    ? nitroGLYCERIN (NITROSTAT) 0.4 MG SL tablet Place 1 tablet (0.4 mg total) under the tongue every 5 (five) minutes as needed for chest pain. 25 tablet 6  ? pantoprazole (PROTONIX) 40 MG tablet TAKE ONE TABLET BY MOUTH ONE TIME DAILY 90 tablet 0  ? pramipexole (MIRAPEX) 1 MG tablet TAKE 1 TABLET AT BEDTIME 90 tablet 1  ? simethicone (GAS-X) 80 MG chewable tablet Chew 1 tablet (80 mg total) by mouth every 6 (six) hours as needed for flatulence. 30 tablet 0  ? ?Facility-Administered Medications Prior to Visit  ?Medication Dose Route Frequency Provider Last Rate Last Admin  ? ipratropium-albuterol (DUONEB) 0.5-2.5 (3) MG/3ML nebulizer solution 3 mL  3 mL Nebulization Once Dorothyann Peng, NP      ? ? ?Allergies  ?Allergen Reactions  ? Cephalexin Hives  ? ? ?ROS ?Review of Systems  ?Constitutional: Negative.    ?Respiratory: Negative.    ?Cardiovascular: Negative.   ?Gastrointestinal: Negative.   ?Endocrine: Negative for polyphagia and polyuria.  ?Musculoskeletal:  Positive for arthralgias. Negative for gait problem.  ?Neurological:  Negative for speech difficulty and weakness.  ?Psychiatric/Behavioral: Negative.    ? ?  ?  Objective:  ?  ?Physical Exam ?Vitals and nursing note reviewed.  ?Constitutional:   ?   General: He is not in acute distress. ?   Appearance: Normal appearance. He is not ill-appearing, toxic-appearing or diaphoretic.  ?HENT:  ?   Head: Normocephalic and atraumatic.  ?   Right Ear: External ear normal.  ?   Left Ear: External ear normal.  ?   Mouth/Throat:  ?   Mouth: Mucous membranes are moist.  ?   Pharynx: Oropharynx is clear. No oropharyngeal exudate or posterior oropharyngeal erythema.  ?Cardiovascular:  ?   Rate and Rhythm: Normal rate and regular rhythm.  ?Pulmonary:  ?   Effort: Pulmonary effort is normal.  ?   Breath sounds: Normal breath sounds.  ?Abdominal:  ?   General: Bowel sounds are normal.  ?Musculoskeletal:  ?   Right hand: Swelling present. No tenderness or bony tenderness. Normal range of motion.  ?   Left hand: Swelling present. No tenderness or bony tenderness. Normal range of motion.  ?     Arms: ? ?   Cervical back: No rigidity or tenderness.  ?Lymphadenopathy:  ?   Cervical: No cervical adenopathy.  ?Neurological:  ?   Mental Status: He is alert and oriented to person, place, and time.  ?Psychiatric:     ?   Mood and Affect: Mood normal.     ?   Behavior: Behavior normal.  ? ? ?BP 123/78 (BP Location: Left Arm, Patient Position: Sitting, Cuff Size: Normal)   Pulse 63   Temp (!) 97.2 ?F (36.2 ?C) (Temporal)   Ht 6' 3"  (1.905 m)   Wt 227 lb 6.4 oz (103.1 kg)   SpO2 97%   BMI 28.42 kg/m?  ?Wt Readings from Last 3 Encounters:  ?11/25/21 227 lb 6.4 oz (103.1 kg)  ?09/30/21 232 lb (105.2 kg)  ?07/16/21 220 lb (99.8 kg)  ? ? ? ?There are no preventive care reminders to display  for this patient. ? ? ?There are no preventive care reminders to display for this patient. ? ?Lab Results  ?Component Value Date  ? TSH 1.870 05/20/2020  ? ?Lab Results  ?Component Value Date  ? WBC 6.0 11/22/2020  ?

## 2021-11-27 NOTE — Progress Notes (Signed)
Please schedule for virtual or ftf to discuss labs.

## 2021-11-28 ENCOUNTER — Encounter: Payer: Self-pay | Admitting: Family Medicine

## 2021-11-28 ENCOUNTER — Other Ambulatory Visit: Payer: Self-pay

## 2021-11-28 ENCOUNTER — Telehealth: Payer: Federal, State, Local not specified - PPO | Admitting: Family Medicine

## 2021-11-28 VITALS — Ht 75.0 in

## 2021-11-28 DIAGNOSIS — E1165 Type 2 diabetes mellitus with hyperglycemia: Secondary | ICD-10-CM | POA: Diagnosis not present

## 2021-11-28 DIAGNOSIS — N1831 Chronic kidney disease, stage 3a: Secondary | ICD-10-CM | POA: Diagnosis not present

## 2021-11-28 MED ORDER — EMPAGLIFLOZIN 10 MG PO TABS: 10.0000 mg | ORAL_TABLET | Freq: Every day | ORAL | 4 refills | Status: DC

## 2021-11-28 NOTE — Progress Notes (Signed)
? ?Established Patient Office Visit ? ?Subjective:  ?Patient ID: Kristopher Wilson, male    DOB: [birth year]  Age: 86 y.o. MRN: [id number] ? ?CC:  ?Chief Complaint  ?Patient presents with  ? Advice Only  ?  Review lab results  ? ? ?HPI ?Kristopher Wilson presents for follow-up of labs drawn at last clinic visit.  Hemoglobin A1c measured at 6.7.  He has no history of diabetes.  He does have a family history of the disease.  History of stage III CKD, coronary artery disease and CHF.  Ongoing urinary frequency with some incontinence.  History of prostatectomy ? ?Past Medical History:  ?Diagnosis Date  ? BACK PAIN, LUMBAR 10/18/2009  ? Benign paroxysmal positional vertigo 09/30/2007  ? CAD (coronary artery disease)   ? Cancer [facility])   ? prostate   ? CHF (congestive heart failure) ([place])   ? CKD (chronic kidney disease)   ? COLONIC POLYPS, ADENOMATOUS, HX OF   ? GERD (gastroesophageal reflux disease)   ? Herpes zoster with other nervous system complications(053.19)   ? Hyperlipidemia   ? Hypertension   ? Hyperthyroidism   ? pt denies  ? Loss of hearing   ? Bil/has hearing aids in both ears  ? Nerve damage of left foot   ? PERIPHERAL NEUROPATHY 10/11/2007  ? 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 PROSTATE CANCER, HX OF 07/07/2007  ? s/p prostatectomy  ? RLS (restless legs syndrome)   ? S/P skin biopsy 06/12/2019  ? Ulcer   ? ULCERATIVE COLITIS, LEFT SIDED 2000  ? URTICARIA, ALLERGIC 08/05/2009  ? VENOUS INSUFFICIENCY 07/28/2010  ? ? ?Past Surgical History:  ?Procedure Laterality Date  ? ANGIOPLASTY  1990's/2004  ? stent placement 2  ? COLONOSCOPY W/ BIOPSIES AND POLYPECTOMY  06/06/2009  ? adenomatous polyp, diverticulosis, colitis  ? ENDOVENOUS ABLATION SAPHENOUS VEIN W/ LASER Left 07/09/2021  ? endovenous laser ablation left greater saphenous vein and stab phlebectomy 10-20 incisions left leg by Gae Gallop MD  ? [place]    ? x x  ? ORIF FIBULA FRACTURE Left 03/02/2017  ? Procedure: OPEN REDUCTION INTERNAL FIXATION  (ORIF) LEFT DISTAL FIBULA FRACTURE WITH DELTOID LIGAMENT REPAIR;  Surgeon: [name], MD;  Location: [place];  Service: Orthopedics;  Laterality: Left;  ? PROSTATECTOMY    ? RETINAL LASER PROCEDURE Right 2016 & 2017   ? ROTATOR CUFF REPAIR Left   ? SKIN BIOPSY  02/26/2020  ? Left Dorsum Bridge of Nose and Right Upper Abdomen  ? TONSILLECTOMY    ? ? ?Family History  ?Problem Relation Age of Onset  ? Heart attack Father   ? Throat cancer Brother   ? Multiple sclerosis Brother   ? Multiple sclerosis Son   ? Diabetes Maternal Uncle   ?     x 2  ? Diabetes Maternal Uncle   ? Colon cancer Neg Hx   ? Stroke Neg Hx   ? ? ?Social History  ? ?Socioeconomic History  ? Marital status: Married  ?  Spouse name: [name]  ? Number of children: 3  ? Years of education: Not on file  ? Highest education level: Not on file  ?Occupational History  ? Occupation: retired  ?  Employer: RETIRED  ? Occupation: [employer]  ?Tobacco Use  ? Smoking status: Former  ?  Types: Cigarettes  ?  Quit date: 09/08/1975  ?  Years since quitting: 46.2  ? Smokeless tobacco: Never  ?Vaping Use  ?  Vaping Use: Never used  ?Substance and Sexual Activity  ? Alcohol use: Yes  ?  Alcohol/week: 15.0 standard drinks  ?  Types: 15 Standard drinks or equivalent per week  ?  Comment: 1-2 "high balls" daily  ? Drug use: No  ? Sexual activity: Not Currently  ?Other Topics Concern  ? Not on file  ?Social History Narrative  ? 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   ? Married   ? Two children, step son. All live in [place]  ? Works part time at [employer]   ? Golf  ? ?Social Determinants of Health  ? ?Financial Resource Strain: Not on file  ?Food Insecurity: Not on file  ?Transportation Needs: Not on file  ?Physical Activity: Not on file  ?Stress: Not on file  ?Social Connections: Not on file  ?Intimate Partner Violence: Not on file  ? ? ?Outpatient Medications Prior to Visit  ?Medication Sig Dispense Refill  ? amLODipine (NORVASC) 10 MG  tablet TAKE 1 TABLET BY MOUTH EVERY DAY 90 tablet 2  ? aspirin 81 MG tablet Take 81 mg by mouth daily.    ? atorvastatin (LIPITOR) 40 MG tablet Take 1 tablet (40 mg total) by mouth daily. 90 tablet 3  ? carvedilol (COREG) 12.5 MG tablet Take 1 tablet (12.5 mg total) by mouth daily. 90 tablet 3  ? clopidogrel (PLAVIX) 75 MG tablet TAKE 1 TABLET DAILY 90 tablet 3  ? diclofenac Sodium (VOLTAREN) 1 % GEL Apply a peanutsized dollop to tender spot in hands 4 times daily as needed. 150 g 1  ? fenofibrate (TRICOR) 145 MG tablet Take 1 tablet (145 mg total) by mouth daily. 90 tablet 3  ? hydrochlorothiazide (HYDRODIURIL) 25 MG tablet Take 1 tablet (25 mg total) by mouth daily. 90 tablet 3  ? isosorbide mononitrate (IMDUR) 60 MG 24 hr tablet TAKE 1 TABLET BY MOUTH EVERY DAY 90 tablet 2  ? LORazepam (ATIVAN) 1 MG tablet PLEASE SEE ATTACHED FOR DETAILED DIRECTIONS    ? meclizine (ANTIVERT) 12.5 MG tablet Take 1 tablet (12.5 mg total) by mouth 2 (two) times daily as needed for dizziness. 30 tablet 0  ? Mesalamine 800 MG TBEC TAKE 3 TABLETS (2,400MG    TOTAL) TWO TIMES A DAY 180 tablet 11  ? NAFTIN 2 % GEL Apply 1 application topically daily.    ? nitroGLYCERIN (NITROSTAT) 0.4 MG SL tablet Place 1 tablet (0.4 mg total) under the tongue every 5 (five) minutes as needed for chest pain. 25 tablet 6  ? pantoprazole (PROTONIX) 40 MG tablet TAKE ONE TABLET BY MOUTH ONE TIME DAILY 90 tablet 0  ? pramipexole (MIRAPEX) 1 MG tablet TAKE 1 TABLET AT BEDTIME 90 tablet 1  ? simethicone (GAS-X) 80 MG chewable tablet Chew 1 tablet (80 mg total) by mouth every 6 (six) hours as needed for flatulence. 30 tablet 0  ? ?Facility-Administered Medications Prior to Visit  ?Medication Dose Route Frequency Provider Last Rate Last Admin  ? ipratropium-albuterol (DUONEB) 0.5-2.5 (3) MG/3ML nebulizer solution 3 mL  3 mL Nebulization Once Dorothyann Peng, NP      ? ? ?Allergies  ?Allergen Reactions  ? Cephalexin Hives  ? ? ?ROS ?Review of Systems   ?Constitutional:  Negative for chills, diaphoresis, fatigue, fever and unexpected weight change.  ?HENT: Negative.    ?Eyes:  Negative for photophobia and visual disturbance.  ?Respiratory: Negative.    ?Cardiovascular: Negative.   ?Gastrointestinal: Negative.   ?Endocrine: Negative for  polyphagia and polyuria.  ?Genitourinary:  Positive for frequency and urgency. Negative for difficulty urinating.  ?Musculoskeletal:  Negative for gait problem and joint swelling.  ?Neurological:  Negative for speech difficulty and weakness.  ?Psychiatric/Behavioral: Negative.    ? ?  ?Objective:  ?  ?Physical Exam ?Vitals and nursing note reviewed.  ?Constitutional:   ?   General: He is not in acute distress. ?   Appearance: Normal appearance. He is not ill-appearing, toxic-appearing or diaphoretic.  ?HENT:  ?   Head: Normocephalic and atraumatic.  ?   Right Ear: External ear normal.  ?   Left Ear: External ear normal.  ?Eyes:  ?   General: No scleral icterus.    ?   Right eye: No discharge.     ?   Left eye: No discharge.  ?   Extraocular Movements: Extraocular movements intact.  ?   Conjunctiva/sclera: Conjunctivae normal.  ?Pulmonary:  ?   Effort: Pulmonary effort is normal.  ?Neurological:  ?   Mental Status: He is alert and oriented to person, place, and time.  ?Psychiatric:     ?   Mood and Affect: Mood normal.     ?   Behavior: Behavior normal.  ? ? ?Ht 6' 3"  (1.905 m)   BMI 28.42 kg/m?  ?Wt Readings from Last 3 Encounters:  ?11/25/21 227 lb 6.4 oz (103.1 kg)  ?09/30/21 232 lb (105.2 kg)  ?07/16/21 220 lb (99.8 kg)  ? ? ? ?Health Maintenance Due  ?Topic Date Due  ? FOOT EXAM  Never done  ? OPHTHALMOLOGY EXAM  Never done  ? ? ?There are no preventive care reminders to display for this patient. ? ?Lab Results  ?Component Value Date  ? TSH 1.870 05/20/2020  ? ?Lab Results  ?Component Value Date  ? WBC 6.0 11/22/2020  ? HGB 14.3 11/22/2020  ? HCT 42.7 11/22/2020  ? MCV 86.1 11/22/2020  ? PLT 251.0 11/22/2020  ? ?Lab Results   ?Component Value Date  ? NA 136 11/25/2021  ? K 3.8 11/25/2021  ? CO2 28 11/25/2021  ? GLUCOSE 122 (H) 11/25/2021  ? BUN 23 11/25/2021  ? CREATININE 1.42 11/25/2021  ? BILITOT 0.7 11/22/2020  ? ALKPHOS 57 11/22/2020  ?

## 2021-12-04 ENCOUNTER — Ambulatory Visit (INDEPENDENT_AMBULATORY_CARE_PROVIDER_SITE_OTHER): Payer: Federal, State, Local not specified - PPO | Admitting: Podiatry

## 2021-12-04 ENCOUNTER — Encounter: Payer: Self-pay | Admitting: Podiatry

## 2021-12-04 DIAGNOSIS — G5792 Unspecified mononeuropathy of left lower limb: Secondary | ICD-10-CM

## 2021-12-04 DIAGNOSIS — R29898 Other symptoms and signs involving the musculoskeletal system: Secondary | ICD-10-CM | POA: Diagnosis not present

## 2021-12-04 DIAGNOSIS — M79605 Pain in left leg: Secondary | ICD-10-CM | POA: Diagnosis not present

## 2021-12-04 DIAGNOSIS — I25119 Atherosclerotic heart disease of native coronary artery with unspecified angina pectoris: Secondary | ICD-10-CM

## 2021-12-04 NOTE — Progress Notes (Signed)
He presents today for follow-up of his pain to his third interdigital space area of his left foot.  States that he noticed his left foot is starting to turn in more and the numbness in his foot and leg seems to move more proximally. ? ?Objective: Vital signs are stable he is alert and oriented x3 no change in physical exam but he does have weakness in his peroneals that were not noted last visit. ? ?Assessment: Probable neuropathy or radiculopathy particularly since it seems to be unilateral left. ? ?Plan: At this point we will going to refer him to neurology for evaluation and treatment. ?

## 2021-12-05 ENCOUNTER — Other Ambulatory Visit: Payer: Self-pay | Admitting: *Deleted

## 2021-12-05 MED ORDER — ISOSORBIDE MONONITRATE ER 60 MG PO TB24: 60.0000 mg | ORAL_TABLET | Freq: Every day | ORAL | 1 refills | Status: DC

## 2022-01-01 ENCOUNTER — Telehealth: Payer: Self-pay

## 2022-01-01 DIAGNOSIS — K219 Gastro-esophageal reflux disease without esophagitis: Secondary | ICD-10-CM

## 2022-01-01 MED ORDER — PANTOPRAZOLE SODIUM 40 MG PO TBEC: 40.0000 mg | DELAYED_RELEASE_TABLET | Freq: Every day | ORAL | 1 refills | Status: DC

## 2022-01-12 ENCOUNTER — Other Ambulatory Visit: Payer: Self-pay | Admitting: Family Medicine

## 2022-01-12 DIAGNOSIS — K219 Gastro-esophageal reflux disease without esophagitis: Secondary | ICD-10-CM

## 2022-01-13 NOTE — Telephone Encounter (Signed)
Pt states pharmacy said they are waiting to hear from PCP about the Rx pantoprazole (PROTONIX) 40 MG tablet ?I informed him that it was sent to the pharmacy on 4/27.  ? ? ?

## 2022-01-13 NOTE — Telephone Encounter (Signed)
Left VM that RX was sent to the mail order 01/01/22. If it needs sent else where please call back and let us know.  Dm/cma ? ?

## 2022-01-19 NOTE — Telephone Encounter (Signed)
Pt called again, stating he spoke with the pharmacy and they are requesting an approval for this med. The number is [phone]. ? ? ?

## 2022-01-22 NOTE — Telephone Encounter (Signed)
Rx approved patient aware and await for delivery.

## 2022-01-26 ENCOUNTER — Emergency Department (HOSPITAL_COMMUNITY): Payer: Federal, State, Local not specified - PPO

## 2022-01-26 ENCOUNTER — Other Ambulatory Visit: Payer: Self-pay

## 2022-01-26 ENCOUNTER — Emergency Department (HOSPITAL_COMMUNITY)
Admission: EM | Admit: 2022-01-26 | Discharge: 2022-01-26 | Disposition: A | Discharge status: Home / Self Care | Payer: Federal, State, Local not specified - PPO | Attending: Emergency Medicine | Admitting: Emergency Medicine

## 2022-01-26 ENCOUNTER — Encounter (HOSPITAL_COMMUNITY): Payer: Self-pay | Admitting: Emergency Medicine

## 2022-01-26 DIAGNOSIS — Z79899 Other long term (current) drug therapy: Secondary | ICD-10-CM | POA: Diagnosis not present

## 2022-01-26 DIAGNOSIS — Z8546 Personal history of malignant neoplasm of prostate: Secondary | ICD-10-CM | POA: Diagnosis not present

## 2022-01-26 DIAGNOSIS — N189 Chronic kidney disease, unspecified: Secondary | ICD-10-CM | POA: Insufficient documentation

## 2022-01-26 DIAGNOSIS — R0789 Other chest pain: Secondary | ICD-10-CM | POA: Insufficient documentation

## 2022-01-26 DIAGNOSIS — I2510 Atherosclerotic heart disease of native coronary artery without angina pectoris: Secondary | ICD-10-CM | POA: Diagnosis not present

## 2022-01-26 DIAGNOSIS — Z7902 Long term (current) use of antithrombotics/antiplatelets: Secondary | ICD-10-CM | POA: Insufficient documentation

## 2022-01-26 DIAGNOSIS — I130 Hypertensive heart and chronic kidney disease with heart failure and stage 1 through stage 4 chronic kidney disease, or unspecified chronic kidney disease: Secondary | ICD-10-CM | POA: Insufficient documentation

## 2022-01-26 DIAGNOSIS — R001 Bradycardia, unspecified: Secondary | ICD-10-CM | POA: Diagnosis not present

## 2022-01-26 DIAGNOSIS — R072 Precordial pain: Secondary | ICD-10-CM

## 2022-01-26 DIAGNOSIS — M79601 Pain in right arm: Secondary | ICD-10-CM | POA: Insufficient documentation

## 2022-01-26 DIAGNOSIS — I509 Heart failure, unspecified: Secondary | ICD-10-CM | POA: Insufficient documentation

## 2022-01-26 DIAGNOSIS — I25119 Atherosclerotic heart disease of native coronary artery with unspecified angina pectoris: Secondary | ICD-10-CM

## 2022-01-26 DIAGNOSIS — Z7982 Long term (current) use of aspirin: Secondary | ICD-10-CM | POA: Insufficient documentation

## 2022-01-26 LAB — BASIC METABOLIC PANEL
Anion gap: 8 (ref 5–15)
BUN: 19 mg/dL (ref 8–23)
CO2: 26 mmol/L (ref 22–32)
Calcium: 9.5 mg/dL (ref 8.9–10.3)
Chloride: 105 mmol/L (ref 98–111)
Creatinine, Ser: 1.41 mg/dL — ABNORMAL HIGH (ref 0.61–1.24)
GFR, Estimated: 49 mL/min — ABNORMAL LOW (ref >60)
Glucose, Bld: 119 mg/dL — ABNORMAL HIGH (ref 70–99)
Potassium: 3.6 mmol/L (ref 3.5–5.1)
Sodium: 139 mmol/L (ref 135–145)

## 2022-01-26 LAB — CBC
HCT: 40.5 % (ref 39.0–52.0)
Hemoglobin: 13.6 g/dL (ref 13.0–17.0)
MCH: 29.2 pg (ref 26.0–34.0)
MCHC: 33.6 g/dL (ref 30.0–36.0)
MCV: 87.1 fL (ref 80.0–100.0)
Platelets: 249 10*3/uL (ref 150–400)
RBC: 4.65 MIL/uL (ref 4.22–5.81)
RDW: 14.1 % (ref 11.5–15.5)
WBC: 5.4 10*3/uL (ref 4.0–10.5)
nRBC: 0 % (ref 0.0–0.2)

## 2022-01-26 LAB — TROPONIN I (HIGH SENSITIVITY)
Troponin I (High Sensitivity): 3 ng/L (ref <18)
Troponin I (High Sensitivity): 4 ng/L (ref <18)

## 2022-01-26 MED ORDER — ASPIRIN 81 MG PO CHEW
324.0000 mg | CHEWABLE_TABLET | Freq: Once | ORAL | Status: AC
  Administered 2022-01-26: 324 mg via ORAL
  Filled 2022-01-26: qty 4

## 2022-01-26 NOTE — ED Provider Notes (Signed)
  Physical Exam  BP 133/87   Pulse (!) 56   Temp 98.1 F (36.7 C) (Oral)   Resp 19   Wt 104.3 kg   SpO2 95%   BMI 28.75 kg/m   Physical Exam  Procedures  Procedures  ED Course / MDM   Clinical Course as of 01/26/22 1028  Mon Jan 26, 2022  0633 Patient with known history of CAD presenting with right-sided chest pain.  He is pain-free.  Denies any pleuritic pain or back pain.  Vital signs appropriate, pulse ox about 95% when I was in the room.  Records reveal patient had low risk Lexiscan scan in September 2022 [DW]  0655 All discussion with patient about his symptoms.  He is chest pain-free and appears well.  We discussed the risk and benefits of being discharged, patient would prefer to be discharged home. Low suspicion for PE/Dissection.   [DW]  [phone] Signed out to dr Alvino Chapel at shift change [DW]    Clinical Course User Index [DW] Ripley Fraise, MD   Medical Decision Making Amount and/or Complexity of Data Reviewed Labs: ordered. Radiology: ordered.  Risk OTC drugs.   Patient with chest pain received in signout.  Anterior chest.  Known cardiac history.  Recent negative stress test back in September of last year.  Troponin negative x2.  EKG reassuring.  Appears stable for discharge home with outpatient cardiology follow-up.       Davonna Belling, MD 01/26/22 1028

## 2022-01-26 NOTE — ED Triage Notes (Signed)
Presents from home for CP that started as heartburn at 11pm. Awoke again this AM with R sided CP that radiates into R arm. Had been 8/10 but now 3/10. Has not taken any nitro or ASA today.  H/o stent x2.  Nonsmoker. No pacemaker.

## 2022-01-26 NOTE — ED Provider Notes (Signed)
[facility] EMERGENCY DEPARTMENT Provider Note   CSN: [id number] Arrival date & time: 01/26/22  0459     History  Chief Complaint  Patient presents with   Chest Pain    Kristopher Wilson is a 86 y.o. male.  The history is provided by the patient and the spouse.  Chest Pain Pain location:  R chest Pain quality: pressure   Pain radiates to:  R arm Pain severity:  Moderate Onset quality:  Gradual Timing:  Constant Progression:  Resolved Chronicity:  New Worsened by:  Nothing Associated symptoms: no back pain, no fever, no numbness, no shortness of breath, no vomiting and no weakness    HPI: A 86 year old patient with a history of hypertension and hypercholesterolemia presents for evaluation of chest pain. Initial onset of pain was approximately 1-3 hours ago. The patient's chest pain is described as heaviness/pressure/tightness and is not worse with exertion. The patient's chest pain is not middle- or left-sided, is not well-localized, is not sharp and does radiate to the arms/jaw/neck. The patient does not complain of nausea and denies diaphoresis. The patient has a family history of coronary artery disease in a first-degree relative with onset less than age 27. The patient has no history of stroke, has no history of peripheral artery disease, has not smoked in the past 90 days, denies any history of treated diabetes and does not have an elevated BMI (>=30).   Patient reports he had some indigestion prior to going to bed which may have been due to dinner. He then woke up several hours later with right-sided chest pain and pressure.  He also had some pain in his right arm.  He had no other associated symptoms.  He is now back to baseline.  Reports he had brief episodes of chest pain in the past several weeks. No pleuritic chest pain.  Denies any back pain Past Medical History:  Diagnosis Date   BACK PAIN, LUMBAR 10/18/2009   Benign paroxysmal positional vertigo  09/30/2007   CAD (coronary artery disease)    Cancer (HCC)    prostate    CHF (congestive heart failure) (HCC)    CKD (chronic kidney disease)    COLONIC POLYPS, ADENOMATOUS, HX OF    GERD (gastroesophageal reflux disease)    Herpes zoster with other nervous system complications(053.19)    Hyperlipidemia    Hypertension    Hyperthyroidism    pt denies   Loss of hearing    Bil/has hearing aids in both ears   Nerve damage of left foot    PERIPHERAL NEUROPATHY 10/11/2007   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 Authorizing Provider  amLODipine (NORVASC) 10 MG tablet TAKE 1 TABLET BY MOUTH EVERY DAY 10/30/21   Jerline Pain, MD  aspirin 81 MG tablet Take 81 mg by mouth daily.    [provider]  atorvastatin (LIPITOR) 40 MG tablet Take 1 tablet (40 mg total) by mouth daily. 07/24/21   Jerline Pain, MD  carvedilol (COREG) 12.5 MG tablet Take 1 tablet (12.5 mg total) by mouth daily. 05/30/20   Burtis Junes, NP  clopidogrel (PLAVIX) 75 MG  tablet TAKE 1 TABLET DAILY 06/03/21   Jerline Pain, MD  diclofenac Sodium (VOLTAREN) 1 % GEL Apply a peanutsized dollop to tender spot in hands 4 times daily as needed. 11/25/21   Libby Maw, MD  empagliflozin (JARDIANCE) 10 MG TABS tablet Take 1 tablet (10 mg total) by mouth daily before breakfast. 11/28/21   Libby Maw, MD  fenofibrate (TRICOR) 145 MG tablet Take 1 tablet (145 mg total) by mouth daily. 07/24/21   Jerline Pain, MD  hydrochlorothiazide (HYDRODIURIL) 25 MG tablet Take 1 tablet (25 mg total) by mouth daily. 05/20/21   Jerline Pain, MD  isosorbide mononitrate (IMDUR) 60 MG 24 hr  tablet Take 1 tablet (60 mg total) by mouth daily. 12/05/21   Jerline Pain, MD  LORazepam (ATIVAN) 1 MG tablet PLEASE SEE ATTACHED FOR DETAILED DIRECTIONS 07/02/21   [provider]  meclizine (ANTIVERT) 12.5 MG tablet Take 1 tablet (12.5 mg total) by mouth 2 (two) times daily as needed for dizziness. 02/11/21   Libby Maw, MD  Mesalamine 800 MG TBEC TAKE 3 TABLETS (2,400MG    TOTAL) TWO TIMES A DAY 09/30/21   Gatha Mayer, MD  NAFTIN 2 % GEL Apply 1 application topically daily. 11/05/21   [provider]  nitroGLYCERIN (NITROSTAT) 0.4 MG SL tablet Place 1 tablet (0.4 mg total) under the tongue every 5 (five) minutes as needed for chest pain. 11/08/18   Burtis Junes, NP  pantoprazole (PROTONIX) 40 MG tablet Take 1 tablet (40 mg total) by mouth daily. 01/01/22   Libby Maw, MD  pramipexole (MIRAPEX) 1 MG tablet TAKE 1 TABLET AT BEDTIME 10/27/21   Libby Maw, MD  simethicone (GAS-X) 80 MG chewable tablet Chew 1 tablet (80 mg total) by mouth every 6 (six) hours as needed for flatulence. 05/26/21   Libby Maw, MD      Allergies    Cephalexin    Review of Systems   Review of Systems  Constitutional:  Negative for fever.  Respiratory:  Negative for shortness of breath.   Cardiovascular:  Positive for chest pain.  Gastrointestinal:  Negative for vomiting.  Musculoskeletal:  Negative for back pain.  Neurological:  Negative for weakness and numbness.   Physical Exam Updated Vital Signs BP 120/70   Pulse (!) 58   Temp 98.1 F (36.7 C) (Oral)   Resp (!) 23   Wt 104.3 kg   SpO2 95%   BMI 28.75 kg/m  Physical Exam CONSTITUTIONAL: Well developed/well nourished HEAD: Normocephalic/atraumatic EYES: EOMI/PERRL ENMT: Mucous membranes moist NECK: supple no meningeal signs SPINE/BACK:entire spine nontender CV: S1/S2 noted, no murmurs/rubs/gallops noted LUNGS: Lungs are clear to auscultation bilaterally, no apparent  distress ABDOMEN: soft, nontender, no rebound or guarding, bowel sounds noted throughout abdomen GU:no cva tenderness NEURO: Pt is awake/alert/appropriate, moves all extremitiesx4.  No facial droop.   EXTREMITIES: pulses normal/equalx4, full ROM SKIN: warm, color normal PSYCH: no abnormalities of mood noted, alert and oriented to situation  ED Results / Procedures / Treatments   Labs (all labs ordered are listed, but only abnormal results are displayed) Labs Reviewed  BASIC METABOLIC PANEL - Abnormal; Notable for the following components:      Result Value   Glucose, Bld 119 (*)    Creatinine, Ser 1.41 (*)    GFR, Estimated 49 (*)    All other components within normal limits  CBC  TROPONIN I (HIGH SENSITIVITY)    EKG EKG Interpretation  Date/Time:  Monday Jan 26 2022 05:11:10 EDT Ventricular Rate:  59 PR Interval:  186 QRS Duration: 100 QT Interval:  430 QTC Calculation: 425 R Axis:   83 Text Interpretation: Sinus bradycardia Otherwise normal ECG When compared with ECG of 04-May-2016 10:59, No significant change since last tracing Confirmed by Ripley Fraise [phone]) on 01/26/2022 5:54:24 AM  Radiology DG Chest 2 View  Result Date: 01/26/2022 CLINICAL DATA:  Chest pain EXAM: CHEST - 2 VIEW COMPARISON:  08/31/2019 FINDINGS: The lungs are clear without focal pneumonia, edema, pneumothorax or pleural effusion. Similar streaky opacity at the bases suggest atelectasis or scarring. The cardiopericardial silhouette is within normal limits for size. The visualized bony structures of the thorax are unremarkable. IMPRESSION: No active cardiopulmonary disease. Electronically Signed   By: Misty Stanley M.D.   On: 01/26/2022 06:02    Procedures Procedures    Medications Ordered in ED Medications  aspirin chewable tablet 324 mg (324 mg Oral Given 01/26/22 2248)    ED Course/ Medical Decision Making/ A&P Clinical Course as of 01/26/22 0657  Mon Jan 26, 2022  0633 Patient with known  history of CAD presenting with right-sided chest pain.  He is pain-free.  Denies any pleuritic pain or back pain.  Vital signs appropriate, pulse ox about 95% when I was in the room.  Records reveal patient had low risk Lexiscan scan in September 2022 [DW]  0655 All discussion with patient about his symptoms.  He is chest pain-free and appears well.  We discussed the risk and benefits of being discharged, patient would prefer to be discharged home. Low suspicion for PE/Dissection.   [DW]  [phone] Signed out to dr Alvino Chapel at shift change [DW]    Clinical Course User Index [DW] Ripley Fraise, MD   HEAR Score: 5                       Medical Decision Making Amount and/or Complexity of Data Reviewed Labs: ordered. Radiology: ordered.  Risk OTC drugs.   This patient presents to the ED for concern of chest pain, this involves an extensive number of treatment options, and is a complaint that carries with it a high risk of complications and morbidity.  The differential diagnosis includes but is not limited to acute coronary syndrome, aortic dissection, pulmonary embolism, pericarditis, pneumothorax, pneumonia, myocarditis, pleurisy, esophageal rupture    Comorbidities that complicate the patient evaluation: Patient's presentation is complicated by their history of coronary artery disease, hypertension, hyperlipidemia   Additional history obtained: Additional history obtained from spouse Records reviewed  cardiology records reviewed  Lab Tests: I Ordered, and personally interpreted labs.  The pertinent results include: Revealed mild renal insufficiency  Imaging Studies ordered: I ordered imaging studies including X-ray chest   I independently visualized and interpreted imaging which showed no acute finding I agree with the radiologist interpretation  Cardiac Monitoring: The patient was maintained on a cardiac monitor.  I personally viewed and interpreted the cardiac monitor which showed  an underlying rhythm of:  sinus rhythm  Medicines ordered and prescription drug management: I ordered medication including aspirin for chest pain  Test Considered: Hear score is 5 with moderate risk. Considered admission, patient prefers to be discharged  Reevaluation: After the interventions noted above, I reevaluated the patient and found that they have :improved  Complexity of problems addressed: Patient's presentation is most consistent with  acute presentation with potential threat to life or bodily function  Final Clinical Impression(s) / ED Diagnoses Final diagnoses:  None    Rx / DC Orders ED Discharge Orders     None         Ripley Fraise, MD 01/26/22 [phone]

## 2022-01-26 NOTE — Discharge Instructions (Addendum)

## 2022-01-27 ENCOUNTER — Telehealth: Payer: Self-pay | Admitting: Neurology

## 2022-01-27 ENCOUNTER — Ambulatory Visit (INDEPENDENT_AMBULATORY_CARE_PROVIDER_SITE_OTHER): Payer: Federal, State, Local not specified - PPO | Admitting: Neurology

## 2022-01-27 ENCOUNTER — Encounter: Payer: Self-pay | Admitting: Neurology

## 2022-01-27 ENCOUNTER — Telehealth: Payer: Self-pay

## 2022-01-27 VITALS — BP 135/79 | HR 56 | Ht 75.0 in | Wt 225.5 lb

## 2022-01-27 DIAGNOSIS — G8929 Other chronic pain: Secondary | ICD-10-CM | POA: Diagnosis not present

## 2022-01-27 DIAGNOSIS — M545 Low back pain, unspecified: Secondary | ICD-10-CM | POA: Diagnosis not present

## 2022-01-27 DIAGNOSIS — R29898 Other symptoms and signs involving the musculoskeletal system: Secondary | ICD-10-CM | POA: Insufficient documentation

## 2022-01-27 DIAGNOSIS — I25119 Atherosclerotic heart disease of native coronary artery with unspecified angina pectoris: Secondary | ICD-10-CM

## 2022-01-27 NOTE — Progress Notes (Signed)
Chief Complaint  Patient presents with   New Patient (Initial Visit)    Rm 15. Accompanied by wife, Kristopher Wilson. NP internal referral for ascending neuropathic pain and weakness left lower extremity.      ASSESSMENT AND PLAN  Kristopher Wilson is a 86 y.o. male   Progressive worsening left foot numbness, weakness, Long history of chronic low back pain  Differentiation diagnosis include left lumbar radiculopathy, with a background of peripheral neuropathy  MRI of lumbar spine  Laboratory for possible causes of peripheral neuropathy  A1c was 6.7 At risk for obstructive sleep apnea  He wants to hold off sleep referral at this point   Return to clinic with nurse practitioner in 3 months, if decided to have conservative treatment, may consider recommend patient for left foot ankle brace,   DIAGNOSTIC DATA (LABS, IMAGING, TESTING) - I reviewed patient records, labs, notes, testing and imaging myself where available.   MEDICAL HISTORY:  Kristopher Wilson is a 86 year old male, seen in request by podiatrist Dr. Milinda Pointer, Max T, for evaluation of left foot pain, gait abnormality, his primary care physician is Dr.Kremer, Mortimer Fries initial evaluation was with his wife on Jan 27, 2022  I reviewed and summarized the referring note. Pmhx HTN HLD CAD, s/p stent Prostate Cancer, prostatectomy Shingles at left L4 (medial leg) Ulcerative Colitis Left ankle fracture. Left varicose vein procedure,   He reported a history of shingles involving left medial leg around 2013, his post herpetic neuralgia never totally went away, over the years, he also developed painful varicose vein of left lower extremity, underwent laser ablation of left great saphenous vein with stab phlebectomy 2022, he had persistent left lower extremity swelling, he also had a history of left medial ankle fracture required surgery in the past  Since 2021, he noticed increased left foot and ankle discomfort, previous numbness  burning pain from shingles was mainly at the left medial ankle extending to left medial leg, now he began to experience left lateral 3 toes numbness tingling, intermittent sharp shooting pain, mild gait abnormality,  Around 2022, he also noticed mild right first toe paresthesia,  He had long history of low back pain, denies significant radiating pain, he had bladder incontinence, that his contributed to his prostatectomy for his prostate cancer, history of ulcerative colitis, is under good control, no bowel incontinence  He denies significant neck pain, no upper extremity paresthesia or weakness  He continued to be active, plays golfs couple times each week,  Wife reported that he has snoring, choking episode in sleep, frequent awakening at sleep, excessive daytime sleepiness, fatigue, drifting to sleep if he is sitting in recliner for a while,  A1c was 6.7 in March 23, elevated triglyceride 166,  PHYSICAL EXAM:   Vitals:   01/27/22 0751  BP: 135/79  Pulse: (!) 56  Weight: 225 lb 8 oz (102.3 kg)  Height: 6' 3"  (1.905 m)   Not recorded     Body mass index is 28.19 kg/m.  PHYSICAL EXAMNIATION:  Gen: NAD, conversant, well nourised, well groomed                     Cardiovascular: Regular rate rhythm, no peripheral edema, warm, nontender. Eyes: Conjunctivae clear without exudates or hemorrhage Neck: Supple, no carotid bruits. Pulmonary: Clear to auscultation bilaterally   NEUROLOGICAL EXAM:  MENTAL STATUS: Speech/cognition: Awake, alert, oriented to history taking and casual conversation CRANIAL NERVES: CN II: Visual fields are full to confrontation. Pupils are  round equal and briskly reactive to light. CN III, IV, VI: extraocular movement are normal. No ptosis. CN V: Facial sensation is intact to light touch CN VII: Face is symmetric with normal eye closure  CN VIII: Hearing is normal to causal conversation. CN IX, X: Phonation is normal. CN XI: Head turning and  shoulder shrug are intact  MOTOR: Left ankle swelling, moderate left ankle dorsiflexion, mild plantarflexion, eversion weakness, no significant right distal leg muscle weakness, bilateral lower extremity proximal muscles and upper extremity muscle strength was normal.  REFLEXES: Reflexes are 2+ and symmetric at the biceps, triceps, knees, and absent at ankles. Plantar responses are flexor.  SENSORY: Length-dependent decreased light touch, pinprick, vibratory sensation to above ankle level,  COORDINATION: There is no trunk or limb dysmetria noted.  GAIT/STANCE: Need push-up to get up from seated position, left foot drop, could not stand up on the left tiptoe or heels, mildly unsteady  REVIEW OF SYSTEMS:  Full 14 system review of systems performed and notable only for as above All other review of systems were negative.   ALLERGIES: Allergies  Allergen Reactions   Cephalexin Hives    HOME MEDICATIONS: Current Outpatient Medications  Medication Sig Dispense Refill   amLODipine (NORVASC) 10 MG tablet TAKE 1 TABLET BY MOUTH EVERY DAY 90 tablet 2   aspirin 81 MG tablet Take 81 mg by mouth daily.     atorvastatin (LIPITOR) 40 MG tablet Take 1 tablet (40 mg total) by mouth daily. 90 tablet 3   carvedilol (COREG) 12.5 MG tablet Take 1 tablet (12.5 mg total) by mouth daily. 90 tablet 3   clopidogrel (PLAVIX) 75 MG tablet TAKE 1 TABLET DAILY 90 tablet 3   fenofibrate (TRICOR) 145 MG tablet Take 1 tablet (145 mg total) by mouth daily. 90 tablet 3   hydrochlorothiazide (HYDRODIURIL) 25 MG tablet Take 1 tablet (25 mg total) by mouth daily. 90 tablet 3   isosorbide mononitrate (IMDUR) 60 MG 24 hr tablet Take 1 tablet (60 mg total) by mouth daily. 90 tablet 1   Mesalamine 800 MG TBEC TAKE 3 TABLETS (2,400MG    TOTAL) TWO TIMES A DAY 180 tablet 11   NAFTIN 2 % GEL Apply 1 application topically daily.     pantoprazole (PROTONIX) 40 MG tablet Take 1 tablet (40 mg total) by mouth daily. 90 tablet 1    pramipexole (MIRAPEX) 1 MG tablet TAKE 1 TABLET AT BEDTIME 90 tablet 1   simethicone (GAS-X) 80 MG chewable tablet Chew 1 tablet (80 mg total) by mouth every 6 (six) hours as needed for flatulence. 30 tablet 0   LORazepam (ATIVAN) 1 MG tablet PLEASE SEE ATTACHED FOR DETAILED DIRECTIONS (Patient not taking: Reported on 01/27/2022)     meclizine (ANTIVERT) 12.5 MG tablet Take 1 tablet (12.5 mg total) by mouth 2 (two) times daily as needed for dizziness. (Patient not taking: Reported on 01/27/2022) 30 tablet 0   nitroGLYCERIN (NITROSTAT) 0.4 MG SL tablet Place 1 tablet (0.4 mg total) under the tongue every 5 (five) minutes as needed for chest pain. (Patient not taking: Reported on 01/27/2022) 25 tablet 6   Current Facility-Administered Medications  Medication Dose Route Frequency Provider Last Rate Last Admin   ipratropium-albuterol (DUONEB) 0.5-2.5 (3) MG/3ML nebulizer solution 3 mL  3 mL Nebulization Once Dorothyann Peng, NP        PAST MEDICAL HISTORY: Past Medical History:  Diagnosis Date   BACK PAIN, LUMBAR 10/18/2009   Benign paroxysmal positional vertigo 09/30/2007  CAD (coronary artery disease)    Cancer (HCC)    prostate    CHF (congestive heart failure) (HCC)    CKD (chronic kidney disease)    COLONIC POLYPS, ADENOMATOUS, HX OF    GERD (gastroesophageal reflux disease)    Herpes zoster with other nervous system complications(053.19)    Hyperlipidemia    Hypertension    Hyperthyroidism    pt denies   Loss of hearing    Bil/has hearing aids in both ears   Nerve damage of left foot    PERIPHERAL NEUROPATHY 10/11/2007   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   PROSTATE CANCER, HX OF 07/07/2007   s/p prostatectomy   RLS (restless legs syndrome)    S/P skin biopsy 06/12/2019   Ulcer    ULCERATIVE COLITIS, LEFT SIDED 2000   URTICARIA, ALLERGIC 08/05/2009   VENOUS INSUFFICIENCY 07/28/2010    PAST SURGICAL HISTORY: Past Surgical History:  Procedure Laterality Date   ANGIOPLASTY   1990's/2004   stent placement 2   COLONOSCOPY W/ BIOPSIES AND POLYPECTOMY  06/06/2009   adenomatous polyp, diverticulosis, colitis   ENDOVENOUS ABLATION SAPHENOUS VEIN W/ LASER Left 07/09/2021   endovenous laser ablation left greater saphenous vein and stab phlebectomy 10-20 incisions left leg by Gae Gallop MD   INGUINAL HERNIA REPAIR     x x   ORIF FIBULA FRACTURE Left 03/02/2017   Procedure: OPEN REDUCTION INTERNAL FIXATION (ORIF) LEFT DISTAL FIBULA FRACTURE WITH DELTOID LIGAMENT REPAIR;  Surgeon: [name], MD;  Location: [facility];  Service: Orthopedics;  Laterality: Left;   PROSTATECTOMY     RETINAL LASER PROCEDURE Right 2016 & 2017    ROTATOR CUFF REPAIR Left    SKIN BIOPSY  02/26/2020   Left Dorsum Bridge of Nose and Right Upper Abdomen   TONSILLECTOMY      FAMILY HISTORY: Family History  Problem Relation Age of Onset   Heart attack Father    Throat cancer Brother    Multiple sclerosis Brother    Multiple sclerosis Son    Diabetes Maternal Uncle        x 2   Diabetes Maternal Uncle    Colon cancer Neg Hx    Stroke Neg Hx     SOCIAL HISTORY: Social History   Socioeconomic History   Marital status: Married    Spouse name: Kristopher Wilson   Number of children: 3   Years of education: Not on file   Highest education level: Not on file  Occupational History   Occupation: retired    Fish farm manager: RETIRED   Occupation: [employer]  Tobacco Use   Smoking status: Former    Types: Cigarettes    Quit date: 09/08/1975    Years since quitting: 46.4   Smokeless tobacco: Never  Vaping Use   Vaping Use: Never used  Substance and Sexual Activity   Alcohol use: Yes    Alcohol/week: 15.0 standard drinks    Types: 15 Standard drinks or equivalent per week    Comment: 1-2 "high balls" daily   Drug use: No   Sexual activity: Not Currently  Other Topics Concern   Not on file  Social History Narrative   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    Married     Two children, step son. All live in [place] area   Works part time at [employer] of [employer]: Not on [employer] Insecurity:  Not on file  Transportation Needs: Not on file  Physical Activity: Not on file  Stress: Not on file  Social Connections: Not on file  Intimate Partner Violence: Not on file      Marcial Pacas, M.D. Ph.D.  [facility] [address] [place], [place] [address] Ph: [phone] Fax: [phone]  CC:  Garrel Ridgel, DPM [address] [place],   [id number]  Libby Maw, MD

## 2022-01-27 NOTE — Telephone Encounter (Signed)
PA for pantoprazole approved from 12/1821 - 01/22/23.  Pharmacy notified [place] phone.  Dm/cma

## 2022-01-27 NOTE — Telephone Encounter (Signed)
medicare/bcbs fed sent to GI they obtain auth and call patient to schedule

## 2022-01-28 ENCOUNTER — Ambulatory Visit
Admission: RE | Admit: 2022-01-28 | Discharge: 2022-01-28 | Disposition: A | Discharge status: Home / Self Care | Payer: Federal, State, Local not specified - PPO | Source: Ambulatory Visit | Attending: Neurology | Admitting: Neurology

## 2022-01-28 DIAGNOSIS — G8929 Other chronic pain: Secondary | ICD-10-CM

## 2022-01-28 DIAGNOSIS — R29898 Other symptoms and signs involving the musculoskeletal system: Secondary | ICD-10-CM

## 2022-01-29 ENCOUNTER — Telehealth: Payer: Self-pay | Admitting: Neurology

## 2022-01-29 NOTE — Telephone Encounter (Signed)
Please call patient, MRI of lumbar showed multilevel degenerative changes, no evidence of moderate stenosis at the L4-5 level, with variable degree of foraminal narrowing  Laboratory evaluation for etiology of peripheral neuropathy consistent with diabetes, A1c was 6.7, rest of the laboratory evaluation showed no significant abnormality.  If you still have significant lower extremity paresthesia, low back pain, radiating pain from back to lower extremity, I can offer EMG nerve conduction study,  If his symptoms is stable, he is functioning well, may consider medication treatment for his complaints of paresthesia, options including gabapentin, Lyrica, Cymbalta,   IMPRESSION: 1. Lumbar spine degeneration especially at L4-5 where there is mild anterolisthesis and moderate spinal stenosis. 2. L5-S1 facet osteoarthritis more notable on the right where a 4 mm synovial cyst contacts the foraminal L5 nerve root.

## 2022-01-29 NOTE — Telephone Encounter (Signed)
Left message asking him for a return call. Provided our holiday office hours and told him it was non-urgent.

## 2022-01-30 LAB — PROTEIN ELECTROPHORESIS, SERUM
A/G Ratio: 1.8 — ABNORMAL HIGH (ref 0.7–1.7)
Albumin ELP: 4.1 g/dL (ref 2.9–4.4)
Alpha 1: 0.1 g/dL (ref 0.0–0.4)
Alpha 2: 0.6 g/dL (ref 0.4–1.0)
Beta: 1.1 g/dL (ref 0.7–1.3)
Gamma Globulin: 0.5 g/dL (ref 0.4–1.8)
Globulin, Total: 2.3 g/dL (ref 2.2–3.9)

## 2022-01-30 LAB — IMMUNOFIXATION ELECTROPHORESIS
IgA/Immunoglobulin A, Serum: 154 mg/dL (ref 61–437)
IgG (Immunoglobin G), Serum: 612 mg/dL (ref 603–1613)
IgM (Immunoglobulin M), Srm: 14 mg/dL — ABNORMAL LOW (ref 15–143)
Total Protein: 6.4 g/dL (ref 6.0–8.5)

## 2022-01-30 LAB — SEDIMENTATION RATE: Sed Rate: 2 mm/hr (ref 0–30)

## 2022-01-30 LAB — C-REACTIVE PROTEIN: CRP: <1 mg/L (ref 0–10)

## 2022-01-30 LAB — ANA W/REFLEX IF POSITIVE: Anti Nuclear Antibody (ANA): NEGATIVE

## 2022-01-30 LAB — VITAMIN B12: Vitamin B-12: 312 pg/mL (ref 232–1245)

## 2022-02-03 NOTE — Telephone Encounter (Signed)
I spoke to the patient and provided him with the MRI results. He would like to move forward with the NCV/EMG. He has been scheduled. States he already has a follow up w/ his PCP and will discuss the elevated A1C.

## 2022-02-11 ENCOUNTER — Encounter: Payer: Self-pay | Admitting: Cardiology

## 2022-02-11 ENCOUNTER — Ambulatory Visit: Payer: Federal, State, Local not specified - PPO | Admitting: Cardiology

## 2022-02-11 DIAGNOSIS — N1831 Chronic kidney disease, stage 3a: Secondary | ICD-10-CM

## 2022-02-11 DIAGNOSIS — I10 Essential (primary) hypertension: Secondary | ICD-10-CM

## 2022-02-11 DIAGNOSIS — I6523 Occlusion and stenosis of bilateral carotid arteries: Secondary | ICD-10-CM | POA: Diagnosis not present

## 2022-02-11 DIAGNOSIS — I25119 Atherosclerotic heart disease of native coronary artery with unspecified angina pectoris: Secondary | ICD-10-CM | POA: Diagnosis not present

## 2022-02-11 NOTE — Assessment & Plan Note (Signed)
Bilateral nonobstructive disease, followed by vascular surgery with dual antiplatelet therapy and high intensity statin.  His LDL has improved down to 74.  Continue with current dosing.  Close to goal.

## 2022-02-11 NOTE — Patient Instructions (Signed)
Medication Instructions:  Your physician recommends that you continue on your current medications as directed. Please refer to the Current Medication list given to you today.  *If you need a refill on your cardiac medications before your next appointment, please call your pharmacy*  Lab Work: NONE  Testing/Procedures: NONE  Follow-Up: At [facility], you and your health needs are our priority.  As part of our continuing mission to provide you with exceptional heart care, we have created designated Provider Care Teams.  These Care Teams include your primary Cardiologist (physician) and Advanced Practice Providers (APPs -  Physician Assistants and Nurse Practitioners) who all work together to provide you with the care you need, when you need it.  Your next appointment:   1 year(s)  The format for your next appointment:   In Person  Provider:   Candee Furbish, MD {   Important Information About Sugar

## 2022-02-11 NOTE — Assessment & Plan Note (Signed)
At last check creatinine 1.4.  Stable.  Try to avoid NSAID use.

## 2022-02-11 NOTE — Assessment & Plan Note (Signed)
Currently well controlled.  Amlodipine can exacerbate some lower extremity edema.  Overall doing well.

## 2022-02-11 NOTE — Assessment & Plan Note (Signed)
Prior stress test in 2022 reassuring.  Low risk, no ischemia.  Continue with isosorbide 60 mg.  Carvedilol 12.5 mg as well.  No other changes made.  Aspirin, Plavix.  No bleeding.  Thankfully, he has not had any further significant chest discomfort.  He feels much better since his ER visit.

## 2022-02-11 NOTE — Progress Notes (Signed)
Cardiology Office Note:    Date:  02/11/2022   ID:  Kristopher Wilson, DOB Dec 02, 1935, MRN [id number]  PCP:  Libby Maw, MD   [facility] Providers Cardiologist:  Candee Furbish, MD     Referring MD: Ripley Fraise, MD    History of Present Illness:    Kristopher Wilson is a 86 y.o. male with prior PCI in 2000 bare-metal stent to the LAD and PCI in 2009 with drug-eluting stent to posterior lateral branch, stress test 2016 no ischemia here for follow-up.  Has been seen by vascular surgery for venous reflux lower extremity edema.  Has left foot weakness.  Recently seen by neurology.  MRI lumbar spine reviewed.  Has noted some intermittent chest discomfort.  Went to the emergency department recently for this.  He had an episode where a man collapsed and died on the crew ship and this worried him.  At last visit in September 2022 proceeded with a stress test which was low risk with no ischemia.  Also has bilateral nonobstructive carotid artery disease followed by vascular surgery.  He is on high intensity statin therapy.  SOB with hills.   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   PROSTATE CANCER, HX OF 07/07/2007   s/p prostatectomy   RLS (restless legs syndrome)    S/P skin biopsy 06/12/2019   Ulcer    ULCERATIVE COLITIS, LEFT SIDED 2000   URTICARIA, ALLERGIC 08/05/2009   VENOUS  INSUFFICIENCY 07/28/2010    Past Surgical History:  Procedure Laterality Date   ANGIOPLASTY  1990's/2004   stent placement 2   COLONOSCOPY W/ BIOPSIES AND POLYPECTOMY  06/06/2009   adenomatous polyp, diverticulosis, colitis   ENDOVENOUS ABLATION SAPHENOUS VEIN W/ LASER Left 07/09/2021   endovenous laser ablation left greater saphenous vein and stab phlebectomy 10-20 incisions left leg by Gae Gallop MD   INGUINAL HERNIA REPAIR     x x   ORIF FIBULA FRACTURE Left 03/02/2017   Procedure: OPEN REDUCTION INTERNAL FIXATION (ORIF) LEFT DISTAL FIBULA FRACTURE WITH DELTOID LIGAMENT REPAIR;  Surgeon: [name], MD;  Location: [place];  Service: Orthopedics;  Laterality: Left;   PROSTATECTOMY     RETINAL LASER PROCEDURE Right 2016 & 2017    ROTATOR CUFF REPAIR Left    SKIN BIOPSY  02/26/2020   Left Dorsum Bridge of Nose and Right Upper Abdomen   TONSILLECTOMY      Current Medications: Current Meds  Medication Sig   amLODipine (NORVASC) 10 MG tablet TAKE 1 TABLET BY MOUTH EVERY DAY   aspirin 81 MG tablet Take 81 mg by mouth daily.   atorvastatin (LIPITOR) 40 MG tablet Take 1 tablet (40 mg total) by mouth daily.   carvedilol (COREG) 12.5 MG tablet Take 1 tablet (12.5 mg total) by mouth daily.   clopidogrel (  PLAVIX) 75 MG tablet TAKE 1 TABLET DAILY   fenofibrate (TRICOR) 145 MG tablet Take 1 tablet (145 mg total) by mouth daily.   hydrochlorothiazide (HYDRODIURIL) 25 MG tablet Take 1 tablet (25 mg total) by mouth daily.   isosorbide mononitrate (IMDUR) 60 MG 24 hr tablet Take 1 tablet (60 mg total) by mouth daily.   LORazepam (ATIVAN) 1 MG tablet    meclizine (ANTIVERT) 12.5 MG tablet Take 1 tablet (12.5 mg total) by mouth 2 (two) times daily as needed for dizziness.   Mesalamine 800 MG TBEC TAKE 3 TABLETS (2,400MG    TOTAL) TWO TIMES A DAY   NAFTIN 2 % GEL Apply 1 application topically daily.   nitroGLYCERIN (NITROSTAT) 0.4 MG SL tablet Place 1 tablet (0.4 mg total) under the  tongue every 5 (five) minutes as needed for chest pain.   pantoprazole (PROTONIX) 40 MG tablet Take 1 tablet (40 mg total) by mouth daily.   pramipexole (MIRAPEX) 1 MG tablet TAKE 1 TABLET AT BEDTIME   simethicone (GAS-X) 80 MG chewable tablet Chew 1 tablet (80 mg total) by mouth every 6 (six) hours as needed for flatulence.   Current Facility-Administered Medications for the 02/11/22 encounter (Office Visit) with Jerline Pain, MD  Medication   ipratropium-albuterol (DUONEB) 0.5-2.5 (3) MG/3ML nebulizer solution 3 mL     Allergies:   Cephalexin   Social History   Socioeconomic History   Marital status: Married    Spouse name: [name]   Number of children: 3   Years of education: Not on file   Highest education level: Not on file  Occupational History   Occupation: retired    Fish farm manager: RETIRED   Occupation: [employer]  Tobacco Use   Smoking status: Former    Types: Cigarettes    Quit date: 09/08/1975    Years since quitting: 46.4   Smokeless tobacco: Never  Vaping Use   Vaping Use: Never used  Substance and Sexual Activity   Alcohol use: Yes    Alcohol/week: 15.0 standard drinks    Types: 15 Standard drinks or equivalent per week    Comment: 1-2 "high balls" daily   Drug use: No   Sexual activity: Not Currently  Other Topics Concern   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 All live in [place] area   Works part time at [employer] Strain: Not on [employer] Insecurity: Not on file  Transportation Needs: Not on file  Physical Activity: Not on file  Stress: Not on file  Social Connections: Not on file     Family History: The patient's family history includes Diabetes in his maternal uncle and maternal uncle; Heart attack in his father; Multiple sclerosis in his brother and son; Throat cancer in his brother.  There is no history of Colon cancer or Stroke.  ROS:   Please see the history of present illness.     All other systems reviewed and are negative.  EKGs/Labs/Other Studies Reviewed:     Recent Labs: 01/26/2022: BUN 19; Creatinine, Ser 1.41; Hemoglobin 13.6; Platelets 249; Potassium 3.6; Sodium 139  Recent Lipid Panel    Component Value Date/Time   CHOL 156 11/25/2021 0841   CHOL 173 05/20/2020 0846   TRIG 166.0 (H) 11/25/2021 1601  HDL 48.80 11/25/2021 0841   HDL 44 05/20/2020 0846   CHOLHDL 3 11/25/2021 0841   VLDL 33.2 11/25/2021 0841   LDLCALC 74 11/25/2021 0841   LDLCALC 98 05/20/2020 0846   LDLDIRECT 94.0 05/26/2021 0856     Risk Assessment/Calculations:              Physical Exam:    VS:  BP 110/70 (BP Location: Left Arm, Patient Position: Sitting, Cuff Size: Normal)   Pulse 66   Ht 6' 3"  (1.905 m)   Wt 224 lb (101.6 kg)   SpO2 95%   BMI 28.00 kg/m     Wt Readings from Last 3 Encounters:  02/11/22 224 lb (101.6 kg)  01/27/22 225 lb 8 oz (102.3 kg)  01/26/22 230 lb (104.3 kg)     GEN:  Well nourished, well developed in no acute distress HEENT: Normal NECK: No JVD; No carotid bruits LYMPHATICS: No lymphadenopathy CARDIAC: RRR, no murmurs, no rubs, gallops RESPIRATORY:  Clear to auscultation without rales, wheezing or rhonchi  ABDOMEN: Soft, non-tender, non-distended MUSCULOSKELETAL:  No edema; No deformity  SKIN: Warm and dry NEUROLOGIC:  Alert and oriented x 3 PSYCHIATRIC:  Normal affect   ASSESSMENT:    1. Atherosclerosis of native coronary artery of native heart with angina pectoris ([place])   2. Bilateral carotid artery stenosis   3. Essential hypertension   4. Stage 3a chronic kidney disease ([place])    PLAN:    In order of problems listed above:  Atherosclerosis of native coronary artery of native heart with angina pectoris [facility]) Prior stress test in 2022 reassuring.  Low risk, no ischemia.  Continue with isosorbide 60 mg.  Carvedilol 12.5  mg as well.  No other changes made.  Aspirin, Plavix.  No bleeding.  Thankfully, he has not had any further significant chest discomfort.  He feels much better since his ER visit.  Carotid artery disease (HCC) Bilateral nonobstructive disease, followed by vascular surgery with dual antiplatelet therapy and high intensity statin.  His LDL has improved down to 74.  Continue with current dosing.  Close to goal.  Essential hypertension Currently well controlled.  Amlodipine can exacerbate some lower extremity edema.  Overall doing well.  Stage 3a chronic kidney disease ([place]) At last check creatinine 1.4.  Stable.  Try to avoid NSAID use.         Medication Adjustments/Labs and Tests Ordered: Current medicines are reviewed at length with the patient today.  Concerns regarding medicines are outlined above.  No orders of the defined types were placed in this encounter.  No orders of the defined types were placed in this encounter.   Patient Instructions  Medication Instructions:  Your physician recommends that you continue on your current medications as directed. Please refer to the Current Medication list given to you today.  *If you need a refill on your cardiac medications before your next appointment, please call your pharmacy*  Lab Work: NONE  Testing/Procedures: NONE  Follow-Up: At [facility], you and your health needs are our priority.  As part of our continuing mission to provide you with exceptional heart care, we have created designated Provider Care Teams.  These Care Teams include your primary Cardiologist (physician) and Advanced Practice Providers (APPs -  Physician Assistants and Nurse Practitioners) who all work together to provide you with the care you need, when you need it.  Your next appointment:   1 year(s)  The format for your next appointment:   In Person  Provider:   Candee Furbish, MD {   Important Information About Sugar         Signed, Candee Furbish, MD  02/11/2022 9:10 AM    [place]

## 2022-02-23 ENCOUNTER — Other Ambulatory Visit: Payer: Self-pay

## 2022-02-23 MED ORDER — CARVEDILOL 12.5 MG PO TABS: 12.5000 mg | ORAL_TABLET | Freq: Every day | ORAL | 3 refills | Status: DC

## 2022-02-25 ENCOUNTER — Ambulatory Visit (INDEPENDENT_AMBULATORY_CARE_PROVIDER_SITE_OTHER): Payer: Federal, State, Local not specified - PPO | Admitting: Neurology

## 2022-02-25 ENCOUNTER — Telehealth: Payer: Self-pay | Admitting: Neurology

## 2022-02-25 ENCOUNTER — Ambulatory Visit: Payer: Self-pay | Admitting: Neurology

## 2022-02-25 ENCOUNTER — Encounter: Payer: Self-pay | Admitting: Neurology

## 2022-02-25 VITALS — BP 152/83 | HR 101 | Ht 75.0 in | Wt 224.0 lb

## 2022-02-25 DIAGNOSIS — M545 Low back pain, unspecified: Secondary | ICD-10-CM | POA: Diagnosis not present

## 2022-02-25 DIAGNOSIS — G8929 Other chronic pain: Secondary | ICD-10-CM

## 2022-02-25 DIAGNOSIS — R29898 Other symptoms and signs involving the musculoskeletal system: Secondary | ICD-10-CM | POA: Diagnosis not present

## 2022-02-25 DIAGNOSIS — I6523 Occlusion and stenosis of bilateral carotid arteries: Secondary | ICD-10-CM

## 2022-02-25 DIAGNOSIS — R200 Anesthesia of skin: Secondary | ICD-10-CM | POA: Diagnosis not present

## 2022-02-25 DIAGNOSIS — Z0289 Encounter for other administrative examinations: Secondary | ICD-10-CM

## 2022-02-25 NOTE — Progress Notes (Incomplete)
No chief complaint on file.     ASSESSMENT AND PLAN  Kristopher Wilson is a 86 y.o. male   Progressive worsening left foot numbness, weakness, Long history of chronic low back pain  Differentiation diagnosis include left lumbar radiculopathy, with a background of peripheral neuropathy  MRI of lumbar spine  Laboratory for possible causes of peripheral neuropathy  A1c was 6.7 At risk for obstructive sleep apnea  He wants to hold off sleep referral at this point   Return to clinic with nurse practitioner in 3 months, if decided to have conservative treatment, may consider recommend patient for left foot ankle brace,   DIAGNOSTIC DATA (LABS, IMAGING, TESTING) - I reviewed patient records, labs, notes, testing and imaging myself where available.   MEDICAL HISTORY:  Kristopher Wilson is a 86 year old male, seen in request by podiatrist Dr. Milinda Pointer, [place], for evaluation of left foot pain, gait abnormality, his primary care physician is Dr.Kremer, Mort[place] Fries initial evaluation was with his wife on Jan 27, 2022  I reviewed and summarized the referring note. Pmhx HTN HLD CAD, s/p stent Prostate Cancer, prostatectomy Shingles at left L4 (medial leg) Ulcerative Colitis Left ankle fracture. Left varicose vein procedure,   He reported a history of shingles involving left medial leg around 2013, his post herpetic neuralgia never totally went away, over the years, he also developed painful varicose vein of left lower extremity, underwent laser ablation of left great saphenous vein with stab phlebectomy 2022, he had persistent left lower extremity swelling, he also had a history of left medial ankle fracture required surgery in the past  Since 2021, he noticed increased left foot and ankle discomfort, previous numbness burning pain from shingles was mainly at the left medial ankle extending to left medial leg, now he began to experience left lateral 3 toes numbness tingling, intermittent  sharp shooting pain, mild gait abnormality,  Around 2022, he also noticed mild right first toe paresthesia,  He had long history of low back pain, denies significant radiating pain, he had bladder incontinence, that his contributed to his prostatectomy for his prostate cancer, history of ulcerative colitis, is under good control, no bowel incontinence  He denies significant neck pain, no upper extremity paresthesia or weakness  He continued to be active, plays golfs couple times each week,  Wife reported that he has snoring, choking episode in sleep, frequent awakening at sleep, excessive daytime sleepiness, fatigue, drifting to sleep if he is sitting in recliner for a while,  A1c was 6.7 in March 23, elevated triglyceride 166,  PHYSICAL EXAM:   There were no vitals filed for this visit.  Not recorded     There is no height or weight on file to calculate BMI.  PHYSICAL EXAMNIATION:  Gen: NAD, conversant, well nourised, well groomed                     Cardiovascular: Regular rate rhythm, no peripheral edema, warm, nontender. Eyes: Conjunctivae clear without exudates or hemorrhage Neck: Supple, no carotid bruits. Pulmonary: Clear to auscultation bilaterally   NEUROLOGICAL EXAM:  MENTAL STATUS: Speech/cognition: Awake, alert, oriented to history taking and casual conversation CRANIAL NERVES: CN II: Visual fields are full to confrontation. Pupils are round equal and briskly reactive to light. CN III, IV, VI: extraocular movement are normal. No ptosis. CN V: Facial sensation is intact to light touch CN VII: Face is symmetric with normal eye closure  CN VIII: Hearing is normal to causal  conversation. CN IX, X: Phonation is normal. CN XI: Head turning and shoulder shrug are intact  MOTOR: Left ankle swelling, moderate left ankle dorsiflexion, mild plantarflexion, eversion weakness, no significant right distal leg muscle weakness, bilateral lower extremity proximal muscles and  upper extremity muscle strength was normal.  REFLEXES: Reflexes are 2+ and symmetric at the biceps, triceps, knees, and absent at ankles. Plantar responses are flexor.  SENSORY: Length-dependent decreased light touch, pinprick, vibratory sensation to above ankle level,  COORDINATION: There is no trunk or limb dysmetria noted.  GAIT/STANCE: Need push-up to get up from seated position, left foot drop, could not stand up on the left tiptoe or heels, mildly unsteady  REVIEW OF SYSTEMS:  Full 14 system review of systems performed and notable only for as above All other review of systems were negative.   ALLERGIES: Allergies  Allergen Reactions   Cephalexin Hives    HOME MEDICATIONS: Current Outpatient Medications  Medication Sig Dispense Refill   amLODipine (NORVASC) 10 MG tablet TAKE 1 TABLET BY MOUTH EVERY DAY 90 tablet 2   aspirin 81 MG tablet Take 81 mg by mouth daily.     atorvastatin (LIPITOR) 40 MG tablet Take 1 tablet (40 mg total) by mouth daily. 90 tablet 3   carvedilol (COREG) 12.5 MG tablet Take 1 tablet (12.5 mg total) by mouth daily. 90 tablet 3   clopidogrel (PLAVIX) 75 MG tablet TAKE 1 TABLET DAILY 90 tablet 3   fenofibrate (TRICOR) 145 MG tablet Take 1 tablet (145 mg total) by mouth daily. 90 tablet 3   hydrochlorothiazide (HYDRODIURIL) 25 MG tablet Take 1 tablet (25 mg total) by mouth daily. 90 tablet 3   isosorbide mononitrate (IMDUR) 60 MG 24 hr tablet Take 1 tablet (60 mg total) by mouth daily. 90 tablet 1   LORazepam (ATIVAN) 1 MG tablet      meclizine (ANTIVERT) 12.5 MG tablet Take 1 tablet (12.5 mg total) by mouth 2 (two) times daily as needed for dizziness. 30 tablet 0   Mesalamine 800 MG TBEC TAKE 3 TABLETS (2,400MG    TOTAL) TWO TIMES A DAY 180 tablet 11   NAFTIN 2 % GEL Apply 1 application topically daily.     nitroGLYCERIN (NITROSTAT) 0.4 MG SL tablet Place 1 tablet (0.4 mg total) under the tongue every 5 (five) minutes as needed for chest pain. 25 tablet  6   pantoprazole (PROTONIX) 40 MG tablet Take 1 tablet (40 mg total) by mouth daily. 90 tablet 1   pramipexole (MIRAPEX) 1 MG tablet TAKE 1 TABLET AT BEDTIME 90 tablet 1   simethicone (GAS-X) 80 MG chewable tablet Chew 1 tablet (80 mg total) by mouth every 6 (six) hours as needed for flatulence. 30 tablet 0   Current Facility-Administered Medications  Medication Dose Route Frequency Provider Last Rate Last Admin   ipratropium-albuterol (DUONEB) 0.5-2.5 (3) MG/3ML nebulizer solution 3 mL  3 mL Nebulization Once Nafziger, Tommi Rumps, NP        PAST MEDICAL HISTORY: Past Medical History:  Diagnosis Date   BACK PAIN, LUMBAR 10/18/2009   Benign paroxysmal positional vertigo 09/30/2007   CAD (coronary artery disease)    Cancer (HCC)    prostate    CHF (congestive heart failure) (HCC)    CKD (chronic kidney disease)    COLONIC POLYPS, ADENOMATOUS, HX OF    GERD (gastroesophageal reflux disease)    Herpes zoster with other nervous system complications(053.19)    Hyperlipidemia    Hypertension    Hyperthyroidism  pt denies   Loss of hearing    Bil/has hearing aids in both ears   Nerve damage of left foot    PERIPHERAL NEUROPATHY 10/11/2007   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   PROSTATE CANCER, HX OF 07/07/2007   s/p prostatectomy   RLS (restless legs syndrome)    S/P skin biopsy 06/12/2019   Ulcer    ULCERATIVE COLITIS, LEFT SIDED 2000   URTICARIA, ALLERGIC 08/05/2009   VENOUS INSUFFICIENCY 07/28/2010    PAST SURGICAL HISTORY: Past Surgical History:  Procedure Laterality Date   ANGIOPLASTY  1990's/2004   stent placement 2   COLONOSCOPY W/ BIOPSIES AND POLYPECTOMY  06/06/2009   adenomatous polyp, diverticulosis, colitis   ENDOVENOUS ABLATION SAPHENOUS VEIN W/ LASER Left 07/09/2021   endovenous laser ablation left greater saphenous vein and stab phlebectomy 10-20 incisions left leg by Gae Gallop MD   INGUINAL HERNIA REPAIR     x x   ORIF FIBULA FRACTURE Left 03/02/2017    Procedure: OPEN REDUCTION INTERNAL FIXATION (ORIF) LEFT DISTAL FIBULA FRACTURE WITH DELTOID LIGAMENT REPAIR;  Surgeon: [name], MD;  Location: [place];  Service: Orthopedics;  Laterality: Left;   PROSTATECTOMY     RETINAL LASER PROCEDURE Right 2016 & 2017    ROTATOR CUFF REPAIR Left    SKIN BIOPSY  02/26/2020   Left Dorsum Bridge of Nose and Right Upper Abdomen   TONSILLECTOMY      FAMILY HISTORY: Family History  Problem Relation Age of Onset   Heart attack Father    Throat cancer Brother    Multiple sclerosis Brother    Multiple sclerosis Son    Diabetes Maternal Uncle        x 2   Diabetes Maternal Uncle    Colon cancer Neg Hx    Stroke Neg Hx     SOCIAL HISTORY: Social History   Socioeconomic History   Marital status: Married    Spouse name: [name]   Number of children: 3   Years of education: Not on file   Highest education level: Not on file  Occupational History   Occupation: retired    Fish farm manager: RETIRED   Occupation: [employer]  Tobacco Use   Smoking status: Former    Types: Cigarettes    Quit date: 09/08/1975    Years since quitting: 46.4   Smokeless tobacco: Never  Vaping Use   Vaping Use: Never used  Substance and Sexual Activity   Alcohol use: Yes    Alcohol/week: 15.0 standard drinks of alcohol    Types: 15 Standard drinks or equivalent per week    Comment: 1-2 "high balls" daily   Drug use: No   Sexual activity: Not Currently  Other Topics Concern   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 All live in [place] area   Works part time at [employer] of [employer]: Not on [employer] Insecurity: Not on file  Transportation Needs: Not on file  Physical Activity: Not on file  Stress: Not on file  Social Connections: Not on file  Intimate Partner Violence: Not on file      Marcial Pacas, M.D. Ph.D.  [facility] [address] [place],  [address] Ph: [phone] Fax: [phone]  CC:  Libby Maw, MD  [address] [place],  [place] [address]  Libby Maw, MD

## 2022-02-25 NOTE — Telephone Encounter (Signed)
medicare/BCBS fed NPR sent to GI

## 2022-02-27 ENCOUNTER — Telehealth: Payer: Self-pay | Admitting: Neurology

## 2022-02-28 ENCOUNTER — Ambulatory Visit
Admission: RE | Admit: 2022-02-28 | Discharge: 2022-02-28 | Disposition: A | Discharge status: Home / Self Care | Payer: Federal, State, Local not specified - PPO | Source: Ambulatory Visit | Attending: Neurology | Admitting: Neurology

## 2022-02-28 DIAGNOSIS — G8929 Other chronic pain: Secondary | ICD-10-CM

## 2022-02-28 DIAGNOSIS — R29898 Other symptoms and signs involving the musculoskeletal system: Secondary | ICD-10-CM

## 2022-02-28 DIAGNOSIS — R200 Anesthesia of skin: Secondary | ICD-10-CM

## 2022-02-28 MED ORDER — GADOBENATE DIMEGLUMINE 529 MG/ML IV SOLN
20.0000 mL | Freq: Once | INTRAVENOUS | Status: AC | PRN
  Administered 2022-02-28: 20 mL via INTRAVENOUS

## 2022-03-03 ENCOUNTER — Ambulatory Visit: Payer: Federal, State, Local not specified - PPO | Admitting: Family Medicine

## 2022-03-03 ENCOUNTER — Encounter: Payer: Self-pay | Admitting: Family Medicine

## 2022-03-03 VITALS — BP 112/66 | HR 66 | Temp 97.3°F | Ht 75.0 in | Wt 223.4 lb

## 2022-03-03 DIAGNOSIS — K219 Gastro-esophageal reflux disease without esophagitis: Secondary | ICD-10-CM

## 2022-03-03 DIAGNOSIS — N1831 Chronic kidney disease, stage 3a: Secondary | ICD-10-CM | POA: Diagnosis not present

## 2022-03-03 DIAGNOSIS — E7800 Pure hypercholesterolemia, unspecified: Secondary | ICD-10-CM | POA: Diagnosis not present

## 2022-03-03 DIAGNOSIS — I10 Essential (primary) hypertension: Secondary | ICD-10-CM

## 2022-03-03 DIAGNOSIS — R7303 Prediabetes: Secondary | ICD-10-CM | POA: Diagnosis not present

## 2022-03-03 LAB — LIPID PANEL
Cholesterol: 142 mg/dL (ref 0–200)
HDL: 37.2 mg/dL — ABNORMAL LOW (ref >39.00)
LDL Cholesterol: 68 mg/dL (ref 0–99)
NonHDL: 105.04
Total CHOL/HDL Ratio: 4
Triglycerides: 186 mg/dL — ABNORMAL HIGH (ref 0.0–149.0)
VLDL: 37.2 mg/dL (ref 0.0–40.0)

## 2022-03-03 LAB — BASIC METABOLIC PANEL
BUN: 26 mg/dL — ABNORMAL HIGH (ref 6–23)
CO2: 28 mEq/L (ref 19–32)
Calcium: 9.3 mg/dL (ref 8.4–10.5)
Chloride: 100 mEq/L (ref 96–112)
Creatinine, Ser: 1.39 mg/dL (ref 0.40–1.50)
GFR: 46.79 mL/min — ABNORMAL LOW (ref >60.00)
Glucose, Bld: 117 mg/dL — ABNORMAL HIGH (ref 70–99)
Potassium: 3.9 mEq/L (ref 3.5–5.1)
Sodium: 136 mEq/L (ref 135–145)

## 2022-03-03 LAB — HEMOGLOBIN A1C: Hgb A1c MFr Bld: 6.5 % (ref 4.6–6.5)

## 2022-03-03 MED ORDER — DAPAGLIFLOZIN PROPANEDIOL 5 MG PO TABS: 5.0000 mg | ORAL_TABLET | Freq: Every day | ORAL | 1 refills | Status: DC

## 2022-03-06 ENCOUNTER — Encounter: Payer: Self-pay | Admitting: Neurology

## 2022-04-06 ENCOUNTER — Other Ambulatory Visit: Payer: Self-pay | Admitting: Family Medicine

## 2022-04-06 DIAGNOSIS — G2581 Restless legs syndrome: Secondary | ICD-10-CM

## 2022-04-14 ENCOUNTER — Encounter: Payer: Self-pay | Admitting: Family Medicine

## 2022-04-16 ENCOUNTER — Other Ambulatory Visit: Payer: Self-pay

## 2022-04-16 DIAGNOSIS — G2581 Restless legs syndrome: Secondary | ICD-10-CM

## 2022-04-16 DIAGNOSIS — N1831 Chronic kidney disease, stage 3a: Secondary | ICD-10-CM

## 2022-04-16 MED ORDER — DAPAGLIFLOZIN PROPANEDIOL 5 MG PO TABS: 5.0000 mg | ORAL_TABLET | Freq: Every day | ORAL | 1 refills | Status: DC

## 2022-04-16 MED ORDER — PRAMIPEXOLE DIHYDROCHLORIDE 1 MG PO TABS: 1.0000 mg | ORAL_TABLET | Freq: Every day | ORAL | 1 refills | Status: DC

## 2022-04-30 ENCOUNTER — Ambulatory Visit: Payer: Medicare Other | Admitting: Adult Health

## 2022-06-03 ENCOUNTER — Other Ambulatory Visit: Payer: Self-pay | Admitting: Cardiology

## 2022-06-04 ENCOUNTER — Ambulatory Visit: Payer: Federal, State, Local not specified - PPO | Admitting: Family Medicine

## 2022-06-04 ENCOUNTER — Encounter: Payer: Self-pay | Admitting: Family Medicine

## 2022-06-04 VITALS — BP 104/66 | HR 65 | Temp 97.1°F | Ht 75.0 in | Wt 220.6 lb

## 2022-06-04 DIAGNOSIS — R7303 Prediabetes: Secondary | ICD-10-CM

## 2022-06-04 DIAGNOSIS — N1831 Chronic kidney disease, stage 3a: Secondary | ICD-10-CM

## 2022-06-04 DIAGNOSIS — Z23 Encounter for immunization: Secondary | ICD-10-CM | POA: Diagnosis not present

## 2022-06-04 LAB — BASIC METABOLIC PANEL
BUN: 23 mg/dL (ref 6–23)
CO2: 26 mEq/L (ref 19–32)
Calcium: 9.4 mg/dL (ref 8.4–10.5)
Chloride: 103 mEq/L (ref 96–112)
Creatinine, Ser: 1.41 mg/dL (ref 0.40–1.50)
GFR: 45.91 mL/min — ABNORMAL LOW (ref >60.00)
Glucose, Bld: 118 mg/dL — ABNORMAL HIGH (ref 70–99)
Potassium: 3.8 mEq/L (ref 3.5–5.1)
Sodium: 137 mEq/L (ref 135–145)

## 2022-06-04 LAB — MICROALBUMIN / CREATININE URINE RATIO
Creatinine,U: 79.1 mg/dL
Microalb Creat Ratio: 0.9 mg/g (ref 0.0–30.0)
Microalb, Ur: <0.7 mg/dL (ref 0.0–1.9)

## 2022-06-04 LAB — HEMOGLOBIN A1C: Hgb A1c MFr Bld: 6.4 % (ref 4.6–6.5)

## 2022-06-04 NOTE — Progress Notes (Signed)
Established Patient Office Visit  Subjective   Patient ID: Kristopher Wilson, male    DOB: 1936/02/23  Age: 86 y.o. MRN: [id number]  Chief Complaint  Patient presents with   Follow-up    3 month follow up, no concerns. Patient fasting.     HPI follow-up of prediabetes with CKD status post initiation of therapy for both with [place].  Tolerating the drug well.  Has not noticed increased frequency of urination.  He is working to lose weight.  Ophthalmology appointment in November.  Ophthalmology  Review of Systems  Constitutional: Negative.   HENT: Negative.    Eyes:  Negative for blurred vision, discharge and redness.  Respiratory: Negative.    Cardiovascular: Negative.   Gastrointestinal:  Negative for abdominal pain.  Genitourinary: Negative.  Negative for frequency.  Musculoskeletal: Negative.  Negative for myalgias.  Skin:  Negative for rash.  Neurological:  Negative for tingling, loss of consciousness and weakness.  Endo/Heme/Allergies:  Negative for polydipsia.      Objective:     BP 104/66 (BP Location: Left Arm, Patient Position: Sitting, Cuff Size: Normal)   Pulse 65   Temp (!) 97.1 F (36.2 C) (Temporal)   Ht 6' 3"  (1.905 m)   Wt 220 lb 9.6 oz (100.1 kg)   SpO2 93%   BMI 27.57 kg/m    Physical Exam Constitutional:      General: He is not in acute distress.    Appearance: Normal appearance. He is not ill-appearing, toxic-appearing or diaphoretic.  HENT:     Head: Normocephalic and atraumatic.     Right Ear: External ear normal.     Left Ear: External ear normal.     Mouth/Throat:     Mouth: Mucous membranes are moist.     Pharynx: Oropharynx is clear. No oropharyngeal exudate or posterior oropharyngeal erythema.  Eyes:     General: No scleral icterus.       Right eye: No discharge.        Left eye: No discharge.     Extraocular Movements: Extraocular movements intact.     Conjunctiva/sclera: Conjunctivae normal.     Pupils: Pupils are equal, round,  and reactive to light.  Cardiovascular:     Rate and Rhythm: Normal rate and regular rhythm.  Pulmonary:     Effort: Pulmonary effort is normal. No respiratory distress.     Breath sounds: Normal breath sounds.  Abdominal:     General: Bowel sounds are normal.     Tenderness: There is no abdominal tenderness. There is no guarding.  Musculoskeletal:     Cervical back: No rigidity or tenderness.  Skin:    General: Skin is warm and dry.  Neurological:     Mental Status: He is alert and oriented to person, place, and time.  Psychiatric:        Mood and Affect: Mood normal.        Behavior: Behavior normal.    Diabetic Foot Exam - Simple   Simple Foot Form Diabetic Foot exam was performed with the following findings: Yes 06/04/2022  8:33 AM  Visual Inspection See comments: Yes Sensation Testing See comments: Yes Pulse Check See comments: Yes Comments There is no ulceration.  Feet with relaxed arches.  Diminished sensory to light touch in multiple toes.  Pulses are faint with brisk capillary refill.      No results found for any visits on 06/04/22.    The ASCVD Risk score (Arnett DK, et al., 2019)  failed to calculate for the following reasons:   The 2019 ASCVD risk score is only valid for ages 8 to 39    Assessment & Plan:   Problem List Items Addressed This Visit       Genitourinary   Stage 3a chronic kidney disease ([place]) - Primary   Relevant Orders   Basic metabolic panel   Other Visit Diagnoses     Pre-diabetes       Relevant Orders   Basic metabolic panel   Hemoglobin A1c   Microalbumin / creatinine urine ratio   Need for influenza vaccination       Relevant Orders   Flu vaccine HIGH DOSE PF (Fluzone High dose) (Completed)       Return in about 3 months (around 09/03/2022).    Libby Maw, MD

## 2022-06-06 ENCOUNTER — Other Ambulatory Visit: Payer: Self-pay | Admitting: Cardiology

## 2022-06-08 NOTE — Telephone Encounter (Signed)
Pt's medication was sent to pt's pharmacy as requested. Confirmation received.  °

## 2022-06-12 ENCOUNTER — Encounter: Payer: Self-pay | Admitting: Family Medicine

## 2022-06-19 ENCOUNTER — Other Ambulatory Visit: Payer: Self-pay | Admitting: Family Medicine

## 2022-06-19 DIAGNOSIS — K219 Gastro-esophageal reflux disease without esophagitis: Secondary | ICD-10-CM

## 2022-06-29 NOTE — Progress Notes (Signed)
Cardiology Office Note:    Date:  06/30/2022   ID:  [name], DOB 07-10-1936, MRN [id number]  PCP:  Libby Maw, MD  [facility] Cardiologist:  Candee Furbish, MD    Referring MD: Libby Maw,*   Chief Complaint:  Follow-up for CAD    Patient Profile: Coronary artery disease S/p BMS to LAD in 2000 S/p DES to PLV in 2009 Myoview in 05/2021 neg for ischemia (HFpEF) heart failure with preserved ejection fraction  Carotid artery disease Ulcerative colitis Shingles with post herpetic neuralgia L foot drop Hypertension  Hyperlipidemia  GERD Prostate CA Chronic kidney disease  Venous insufficiency Seen by vasc surgery  Prior CV Studies:   Myoview 05/29/2021 Low risk, EF 58, normal perfusion  Carotid US 12/03/2020 Bilateral ICA 1-39  LE arterial US 01/02/2021 Bilateral ABIs normal   - Echo (12/11):  EF 55-65%, mild LAE  - Nuclear (10/12):  No ischemia, EF 65%  History of Present Illness:   JACIEL DIEM is a 86 y.o. male with the above problem list.  He was last seen by Dr. Marlou Porch in June 2023. He returns for f/u.  He is here alone.  Since last seen, he is doing well without chest discomfort or significant shortness of breath.  He does note dyspnea with more extreme activities.  This is chronic and fairly stable.  He has not had syncope, orthopnea.  He has chronic left ankle edema.  He had a prior injury and also has neuropathy from postherpetic neuralgia.  He is able to control swelling with compression stockings.  He likes to play golf and usually plays 2-3 times a week.  He walks on a daily basis for exercise.    Past Medical History:  Diagnosis Date   BACK PAIN, LUMBAR 10/18/2009   Benign paroxysmal positional vertigo 09/30/2007   CAD (coronary artery disease)    Cancer (HCC)    prostate    CHF (congestive heart failure) (HCC)    CKD (chronic kidney disease)    COLONIC POLYPS, ADENOMATOUS, HX OF    GERD (gastroesophageal  reflux disease)    Herpes zoster with other nervous system complications(053.19)    Hyperlipidemia    Hypertension    Hyperthyroidism    pt denies   Loss of hearing    Bil/has hearing aids in both ears   Nerve damage of left foot    PERIPHERAL NEUROPATHY 10/11/2007   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   PROSTATE CANCER, HX OF 07/07/2007   s/p prostatectomy   RLS (restless legs syndrome)    S/P skin biopsy 06/12/2019   Ulcer    ULCERATIVE COLITIS, LEFT SIDED 2000   URTICARIA, ALLERGIC 08/05/2009   VENOUS INSUFFICIENCY 07/28/2010   Current Medications: Current Meds  Medication Sig   amLODipine (NORVASC) 10 MG tablet TAKE 1 TABLET BY MOUTH EVERY DAY   aspirin 81 MG tablet Take 81 mg by mouth daily.   atorvastatin (LIPITOR) 40 MG tablet TAKE 1 TABLET DAILY   carvedilol (COREG) 12.5 MG tablet Take 1 tablet (12.5 mg total) by mouth daily.   clopidogrel (PLAVIX) 75 MG tablet TAKE 1 TABLET DAILY   dapagliflozin propanediol (FARXIGA) 5 MG TABS tablet Take 1 tablet (5 mg total) by mouth daily before breakfast.   fenofibrate (TRICOR) 145 MG tablet Take 1 tablet (145 mg total) by mouth daily.   hydrochlorothiazide (HYDRODIURIL) 25 MG tablet Take 1 tablet (25 mg total) by mouth daily.   isosorbide mononitrate (IMDUR) 60 MG  24 hr tablet TAKE 1 TABLET BY MOUTH EVERY DAY   LORazepam (ATIVAN) 1 MG tablet    meclizine (ANTIVERT) 12.5 MG tablet Take 1 tablet (12.5 mg total) by mouth 2 (two) times daily as needed for dizziness.   Mesalamine 800 MG TBEC TAKE 3 TABLETS (2,400MG    TOTAL) TWO TIMES A DAY   NAFTIN 2 % GEL Apply 1 application topically daily.   nitroGLYCERIN (NITROSTAT) 0.4 MG SL tablet Place 1 tablet (0.4 mg total) under the tongue every 5 (five) minutes as needed for chest pain.   pantoprazole (PROTONIX) 40 MG tablet TAKE 1 TABLET DAILY   pramipexole (MIRAPEX) 1 MG tablet Take 1 tablet (1 mg total) by mouth at bedtime.   simethicone (GAS-X) 80 MG chewable tablet Chew 1 tablet (80 mg  total) by mouth every 6 (six) hours as needed for flatulence.   Current Facility-Administered Medications for the 06/30/22 encounter (Office Visit) with Richardson Dopp T, PA-C  Medication   ipratropium-albuterol (DUONEB) 0.5-2.5 (3) MG/3ML nebulizer solution 3 mL    Allergies:   Cephalexin   Social History   Tobacco Use   Smoking status: Former    Types: Cigarettes    Quit date: 09/08/1975    Years since quitting: 46.8   Smokeless tobacco: Never  Vaping Use   Vaping Use: Never used  Substance Use Topics   Alcohol use: Yes    Alcohol/week: 15.0 standard drinks of alcohol    Types: 15 Standard drinks or equivalent per week    Comment: 1-2 "high balls" daily   Drug use: No    Family Hx: The patient's family history includes Diabetes in his maternal uncle and maternal uncle; Heart attack in his father; Multiple sclerosis in his brother and son; Throat cancer in his brother. There is no history of Colon cancer or Stroke.  Review of Systems  Hematologic/Lymphatic: Does not bruise/bleed easily.  Gastrointestinal:  Negative for hematochezia.  Genitourinary:  Negative for hematuria.     EKGs/Labs/Other Test Reviewed:    EKG:  EKG is not ordered today.    Recent Labs: 01/26/2022: Hemoglobin 13.6; Platelets 249 06/04/2022: BUN 23; Creatinine, Ser 1.41; Potassium 3.8; Sodium 137   Recent Lipid Panel Recent Labs    03/03/22 0847  CHOL 142  TRIG 186.0*  HDL 37.20*  VLDL 37.2  LDLCALC 68     Risk Assessment/Calculations/Metrics:         HYPERTENSION CONTROL Vitals:   06/30/22 0819 06/30/22 0905  BP: (!) 140/62 (!) 150/80    The patient's blood pressure is elevated above target today.  In order to address the patient's elevated BP: Blood pressure will be monitored at home to determine if medication changes need to be made.       Physical Exam:    VS:  BP (!) 150/80   Pulse 82   Ht 6' 3"  (1.905 m)   Wt 216 lb 6.4 oz (98.2 kg)   SpO2 93%   BMI 27.05 kg/m     Wt  Readings from Last 3 Encounters:  06/30/22 216 lb 6.4 oz (98.2 kg)  06/04/22 220 lb 9.6 oz (100.1 kg)  03/03/22 223 lb 6.4 oz (101.3 kg)    Constitutional:      Appearance: Healthy appearance. Not in distress.  Neck:     Vascular: JVD normal.  Pulmonary:     Effort: Pulmonary effort is normal.     Breath sounds: No wheezing. No rales.  Cardiovascular:     Normal  rate. Regular rhythm. Normal S1. Normal S2.      Murmurs: There is no murmur.  Edema:    Peripheral edema present.    Ankle: 1+ edema of the left ankle. Abdominal:     Palpations: Abdomen is soft.  Skin:    General: Skin is warm and dry.  Neurological:     General: No focal deficit present.     Mental Status: Alert and oriented to person, place and time.         ASSESSMENT & PLAN:   Coronary artery disease involving native coronary artery of native heart without angina pectoris History of bare-metal stent to the LAD in 2000 and drug-eluting stent to the PLV in 2009.  Myoview in September 2022 was low risk.  He is doing well without anginal symptoms.  He has been maintained on dual antiplatelet therapy since his last PCI.  He seems to be tolerating this well without any bleeding issues.  If he starts to have issues with easy bruising, it may be best to stop his aspirin and continue on single antiplatelet therapy with Plavix.  For now, continue aspirin 81 mg daily, Plavix 75 mg daily, Lipitor 40 mg daily.  Follow-up in 6 months.  Essential hypertension Blood pressure is above target.  He has not had any medications yet today.  He does admit to a diet high in salt.  I have asked him to monitor his blood pressure over the next couple of weeks and send readings for review through [place].  I will also give him low-sodium diet information.  If his blood pressure remains above target, consider increasing isosorbide to 90 mg daily versus adding ACE/ARB.  Hyperlipidemia LDL in June was 68.  Lipids optimal.  Continue Lipitor 40 mg  daily.  Carotid artery disease ([place]) Ultrasound in March 2022 demonstrated bilateral ICA stenosis 1-39%.  Follow-up ultrasound as needed.            Dispo:  Return in about 6 months (around 12/30/2022) for Routine Follow Up with Dr. Marlou Porch.   Medication Adjustments/Labs and Tests Ordered: Current medicines are reviewed at length with the patient today.  Concerns regarding medicines are outlined above.  Tests Ordered: No orders of the defined types were placed in this encounter.  Medication Changes: No orders of the defined types were placed in this encounter.  Signed, Richardson Dopp, PA-C  06/30/2022 9:06 AM    [facility] [address], [place], [place]  [address] Phone: [phone]; Fax: [phone]

## 2022-06-30 ENCOUNTER — Encounter: Payer: Self-pay | Admitting: Physician Assistant

## 2022-06-30 ENCOUNTER — Ambulatory Visit: Payer: Federal, State, Local not specified - PPO | Attending: Physician Assistant | Admitting: Physician Assistant

## 2022-06-30 VITALS — BP 150/80 | HR 82 | Ht 75.0 in | Wt 216.4 lb

## 2022-06-30 DIAGNOSIS — E7800 Pure hypercholesterolemia, unspecified: Secondary | ICD-10-CM

## 2022-06-30 DIAGNOSIS — I2510 Atherosclerotic heart disease of native coronary artery without angina pectoris: Secondary | ICD-10-CM | POA: Diagnosis not present

## 2022-06-30 DIAGNOSIS — I10 Essential (primary) hypertension: Secondary | ICD-10-CM | POA: Diagnosis not present

## 2022-06-30 DIAGNOSIS — I6523 Occlusion and stenosis of bilateral carotid arteries: Secondary | ICD-10-CM | POA: Diagnosis not present

## 2022-06-30 NOTE — Assessment & Plan Note (Signed)
Ultrasound in March 2022 demonstrated bilateral ICA stenosis 1-39%.  Follow-up ultrasound as needed.

## 2022-06-30 NOTE — Assessment & Plan Note (Signed)
History of bare-metal stent to the LAD in 2000 and drug-eluting stent to the PLV in 2009.  Myoview in September 2022 was low risk.  He is doing well without anginal symptoms.  He has been maintained on dual antiplatelet therapy since his last PCI.  He seems to be tolerating this well without any bleeding issues.  If he starts to have issues with easy bruising, it may be best to stop his aspirin and continue on single antiplatelet therapy with Plavix.  For now, continue aspirin 81 mg daily, Plavix 75 mg daily, Lipitor 40 mg daily.  Follow-up in 6 months.

## 2022-06-30 NOTE — Assessment & Plan Note (Signed)
Blood pressure is above target.  He has not had any medications yet today.  He does admit to a diet high in salt.  I have asked him to monitor his blood pressure over the next couple of weeks and send readings for review through [place].  I will also give him low-sodium diet information.  If his blood pressure remains above target, consider increasing isosorbide to 90 mg daily versus adding ACE/ARB.

## 2022-06-30 NOTE — Assessment & Plan Note (Signed)
LDL in June was 68.  Lipids optimal.  Continue Lipitor 40 mg daily.

## 2022-06-30 NOTE — Patient Instructions (Signed)
Medication Instructions:  Your physician recommends that you continue on your current medications as directed. Please refer to the Current Medication list given to you today.  *If you need a refill on your cardiac medications before your next appointment, please call your pharmacy*   Lab Work: None ordered  If you have labs (blood work) drawn today and your tests are completely normal, you will receive your results only by: [place] (if you have MyChart) OR A paper copy in the mail If you have any lab test that is abnormal or we need to change your treatment, we will call you to review the results.   Testing/Procedures: None ordered   Follow-Up: At [facility], you and your health needs are our priority.  As part of our continuing mission to provide you with exceptional heart care, we have created designated Provider Care Teams.  These Care Teams include your primary Cardiologist (physician) and Advanced Practice Providers (APPs -  Physician Assistants and Nurse Practitioners) who all work together to provide you with the care you need, when you need it.  We recommend signing up for the patient portal called "MyChart".  Sign up information is provided on this After Visit Summary.  MyChart is used to connect with patients for Virtual Visits (Telemedicine).  Patients are able to view lab/test results, encounter notes, upcoming appointments, etc.  Non-urgent messages can be sent to your provider as well.   To learn more about what you can do with MyChart, go to [email or web address].    Your next appointment:   6 month(s)  The format for your next appointment:   In Person  Provider:   Candee Furbish, MD     Other Instructions Your physician has requested that you regularly monitor and record your blood pressure readings at home. Please use the same machine at the same time of day to check your readings and record them to bring to your follow-up visit.   Please  monitor blood pressures and keep a log of your readings for 2 weeks then send a mychart message with those.    Make sure to check 2 hours after your medications.    AVOID these things for 30 minutes before checking your blood pressure: No Drinking caffeine. No Drinking alcohol. No Eating. No Smoking. No Exercising.   Five minutes before checking your blood pressure: Pee. Sit in a dining chair. Avoid sitting in a soft couch or armchair. Be quiet. Do not talk    Low-Sodium Eating Plan Sodium, which is an element that makes up salt, helps you maintain a healthy balance of fluids in your body. Too much sodium can increase your blood pressure and cause fluid and waste to be held in your body. Your health care provider or dietitian may recommend following this plan if you have high blood pressure (hypertension), kidney disease, liver disease, or heart failure. Eating less sodium can help lower your blood pressure, reduce swelling, and protect your heart, liver, and kidneys. What are tips for following this plan? Reading food labels The Nutrition Facts label lists the amount of sodium in one serving of the food. If you eat more than one serving, you must multiply the listed amount of sodium by the number of servings. Choose foods with less than 140 mg of sodium per serving. Avoid foods with 300 mg of sodium or more per serving. Shopping  Look for lower-sodium products, often labeled as "low-sodium" or "no salt added." Always check the sodium content, even if  foods are labeled as "unsalted" or "no salt added." Buy fresh foods. Avoid canned foods and pre-made or frozen meals. Avoid canned, cured, or processed meats. Buy breads that have less than 80 mg of sodium per slice. Cooking  Eat more home-cooked food and less restaurant, buffet, and fast food. Avoid adding salt when cooking. Use salt-free seasonings or herbs instead of table salt or sea salt. Check with your health care provider or  pharmacist before using salt substitutes. Cook with plant-based oils, such as canola, sunflower, or olive oil. Meal planning When eating at a restaurant, ask that your food be prepared with less salt or no salt, if possible. Avoid dishes labeled as brined, pickled, cured, smoked, or made with soy sauce, miso, or teriyaki sauce. Avoid foods that contain MSG (monosodium glutamate). MSG is sometimes added to [place] food, bouillon, and some canned foods. Make meals that can be grilled, baked, poached, roasted, or steamed. These are generally made with less sodium. General information Most people on this plan should limit their sodium intake to 1,500-2,000 mg (milligrams) of sodium each day. What foods should I eat? Fruits Fresh, frozen, or canned fruit. Fruit juice. Vegetables Fresh or frozen vegetables. "No salt added" canned vegetables. "No salt added" tomato sauce and paste. Low-sodium or reduced-sodium tomato and vegetable juice. Grains Low-sodium cereals, including oats, puffed wheat and rice, and shredded wheat. Low-sodium crackers. Unsalted rice. Unsalted pasta. Low-sodium bread. Whole-grain breads and whole-grain pasta. Meats and other proteins Fresh or frozen (no salt added) meat, poultry, seafood, and fish. Low-sodium canned tuna and salmon. Unsalted nuts. Dried peas, beans, and lentils without added salt. Unsalted canned beans. Eggs. Unsalted nut butters. Dairy Milk. Soy milk. Cheese that is naturally low in sodium, such as ricotta cheese, fresh mozzarella, or Swiss cheese. Low-sodium or reduced-sodium cheese. Cream cheese. Yogurt. Seasonings and condiments Fresh and dried herbs and spices. Salt-free seasonings. Low-sodium mustard and ketchup. Sodium-free salad dressing. Sodium-free light mayonnaise. Fresh or refrigerated horseradish. Lemon juice. Vinegar. Other foods Homemade, reduced-sodium, or low-sodium soups. Unsalted popcorn and pretzels. Low-salt or salt-free chips. The items  listed above may not be a complete list of foods and beverages you can eat. Contact a dietitian for more information. What foods should I avoid? Vegetables Sauerkraut, pickled vegetables, and relishes. Olives. [place] fries. Onion rings. Regular canned vegetables (not low-sodium or reduced-sodium). Regular canned tomato sauce and paste (not low-sodium or reduced-sodium). Regular tomato and vegetable juice (not low-sodium or reduced-sodium). Frozen vegetables in sauces. Grains Instant hot cereals. Bread stuffing, pancake, and biscuit mixes. Croutons. Seasoned rice or pasta mixes. Noodle soup cups. Boxed or frozen macaroni and cheese. Regular salted crackers. Self-rising flour. Meats and other proteins Meat or fish that is salted, canned, smoked, spiced, or pickled. Precooked or cured meat, such as sausages or meat loaves. [place] Salines. Ham. Pepperoni. Hot dogs. Corned beef. Chipped beef. Salt pork. Jerky. Pickled herring. Anchovies and sardines. Regular canned tuna. Salted nuts. Dairy Processed cheese and cheese spreads. Hard cheeses. Cheese curds. Blue cheese. Feta cheese. String cheese. Regular cottage cheese. Buttermilk. Canned milk. Fats and oils Salted butter. Regular margarine. Ghee. Bacon fat. Seasonings and condiments Onion salt, garlic salt, seasoned salt, table salt, and sea salt. Canned and packaged gravies. Worcestershire sauce. Tartar sauce. Barbecue sauce. Teriyaki sauce. Soy sauce, including reduced-sodium. Steak sauce. Fish sauce. Oyster sauce. Cocktail sauce. Horseradish that you find on the shelf. Regular ketchup and mustard. Meat flavorings and tenderizers. Bouillon cubes. Hot sauce. Pre-made or packaged marinades. Pre-made or packaged taco  seasonings. Relishes. Regular salad dressings. Salsa. Other foods Salted popcorn and pretzels. Corn chips and puffs. Potato and tortilla chips. Canned or dried soups. Pizza. Frozen entrees and pot pies. The items listed above may not be a complete list  of foods and beverages you should avoid. Contact a dietitian for more information. Summary Eating less sodium can help lower your blood pressure, reduce swelling, and protect your heart, liver, and kidneys. Most people on this plan should limit their sodium intake to 1,500-2,000 mg (milligrams) of sodium each day. Canned, boxed, and frozen foods are high in sodium. Restaurant foods, fast foods, and pizza are also very high in sodium. You also get sodium by adding salt to food. Try to cook at home, eat more fresh fruits and vegetables, and eat less fast food and canned, processed, or prepared foods. This information is not intended to replace advice given to you by your health care provider. Make sure you discuss any questions you have with your health care provider. Document Revised: 09/29/2019 Document Reviewed: 07/26/2019 Elsevier Patient Education  [place]

## 2022-07-24 ENCOUNTER — Other Ambulatory Visit: Payer: Self-pay | Admitting: Cardiology

## 2022-07-27 ENCOUNTER — Telehealth: Payer: Self-pay | Admitting: Family Medicine

## 2022-07-27 NOTE — Telephone Encounter (Signed)
error 

## 2022-09-03 ENCOUNTER — Ambulatory Visit: Payer: Federal, State, Local not specified - PPO | Admitting: Family Medicine

## 2022-09-03 ENCOUNTER — Telehealth: Payer: Self-pay

## 2022-09-03 NOTE — Telephone Encounter (Signed)
Returned call to pt - pt reports pain in L LE when walking and sitting also reports swelling and slight redness from lower leg to ankle that has progressed over the last three weeks but not warmth to the leg. Pt request to be seen in our office.Advised pt to keep leg elevated above the level of his heart and if redness increased and area becomes warm to the touch to report the ED. Appointment was made for pt to be seen in our office on 09/14/22. Pt verbalized understanding and agreed with this plan.

## 2022-09-08 ENCOUNTER — Other Ambulatory Visit: Payer: Self-pay | Admitting: *Deleted

## 2022-09-08 DIAGNOSIS — I872 Venous insufficiency (chronic) (peripheral): Secondary | ICD-10-CM

## 2022-09-10 ENCOUNTER — Encounter: Payer: Self-pay | Admitting: Vascular Surgery

## 2022-09-10 ENCOUNTER — Ambulatory Visit: Payer: Federal, State, Local not specified - PPO | Admitting: Family Medicine

## 2022-09-10 ENCOUNTER — Ambulatory Visit (HOSPITAL_COMMUNITY)
Admission: RE | Admit: 2022-09-10 | Discharge: 2022-09-10 | Disposition: A | Discharge status: Home / Self Care | Payer: Federal, State, Local not specified - PPO | Source: Ambulatory Visit | Attending: Vascular Surgery | Admitting: Vascular Surgery

## 2022-09-10 ENCOUNTER — Ambulatory Visit: Payer: Federal, State, Local not specified - PPO | Admitting: Vascular Surgery

## 2022-09-10 ENCOUNTER — Encounter: Payer: Self-pay | Admitting: Family Medicine

## 2022-09-10 VITALS — BP 110/68 | HR 62 | Temp 96.6°F | Ht 75.0 in | Wt 213.6 lb

## 2022-09-10 VITALS — BP 110/66 | HR 55 | Temp 97.7°F | Resp 20 | Ht 75.0 in | Wt 212.0 lb

## 2022-09-10 DIAGNOSIS — E010 Iodine-deficiency related diffuse (endemic) goiter: Secondary | ICD-10-CM

## 2022-09-10 DIAGNOSIS — I872 Venous insufficiency (chronic) (peripheral): Secondary | ICD-10-CM | POA: Diagnosis not present

## 2022-09-10 DIAGNOSIS — N1831 Chronic kidney disease, stage 3a: Secondary | ICD-10-CM | POA: Diagnosis not present

## 2022-09-10 DIAGNOSIS — E041 Nontoxic single thyroid nodule: Secondary | ICD-10-CM

## 2022-09-10 DIAGNOSIS — R7303 Prediabetes: Secondary | ICD-10-CM | POA: Diagnosis not present

## 2022-09-10 DIAGNOSIS — E7800 Pure hypercholesterolemia, unspecified: Secondary | ICD-10-CM | POA: Diagnosis not present

## 2022-09-10 LAB — BASIC METABOLIC PANEL
BUN: 24 mg/dL — ABNORMAL HIGH (ref 6–23)
CO2: 27 mEq/L (ref 19–32)
Calcium: 9.7 mg/dL (ref 8.4–10.5)
Chloride: 102 mEq/L (ref 96–112)
Creatinine, Ser: 1.32 mg/dL (ref 0.40–1.50)
GFR: 49.6 mL/min — ABNORMAL LOW (ref >60.00)
Glucose, Bld: 117 mg/dL — ABNORMAL HIGH (ref 70–99)
Potassium: 4 mEq/L (ref 3.5–5.1)
Sodium: 140 mEq/L (ref 135–145)

## 2022-09-10 LAB — LIPID PANEL
Cholesterol: 178 mg/dL (ref 0–200)
HDL: 52.2 mg/dL (ref >39.00)
LDL Cholesterol: 104 mg/dL — ABNORMAL HIGH (ref 0–99)
NonHDL: 125.96
Total CHOL/HDL Ratio: 3
Triglycerides: 110 mg/dL (ref 0.0–149.0)
VLDL: 22 mg/dL (ref 0.0–40.0)

## 2022-09-10 LAB — HEMOGLOBIN A1C: Hgb A1c MFr Bld: 6.3 % (ref 4.6–6.5)

## 2022-09-10 LAB — TSH: TSH: 2.41 u[IU]/mL (ref 0.35–5.50)

## 2022-09-10 NOTE — Progress Notes (Signed)
This patient had another appointment that he had to make and therefore left before I could see him.  The results of his venous duplex scan are discussed here.  Does not   Recommend he do all the stuff though which I did anyway so this  And this will just be in no charge Of note, he did not have a DVT.  I discussed this with the vascular tech.  It was not documented in the report.  Treatment.  In the mid to distal.  This is likely

## 2022-09-10 NOTE — Progress Notes (Addendum)
Established Patient Office Visit   Subjective:  Patient ID: Kristopher Wilson, male    DOB: June 21, 1936  Age: 87 y.o. MRN: [id number]  Chief Complaint  Patient presents with   Follow-up    3 month follow up, no concerns. Patient fasting.     HPI Encounter Diagnoses  Name Primary?   Stage 3a chronic kidney disease (HCC) Yes   Pre-diabetes    Elevated LDL cholesterol level    Thyromegaly    Thyroid nodule    Follow-up CKD, prediabetes and elevated LDL cholesterol.  Tolerating [place] well for CKD and prediabetes..  Continues with atorvastatin 40 mg.  Continues weight loss efforts.   Review of Systems  Constitutional: Negative.   HENT: Negative.    Eyes:  Negative for blurred vision, discharge and redness.  Respiratory: Negative.    Cardiovascular: Negative.   Gastrointestinal:  Negative for abdominal pain.  Genitourinary: Negative.   Musculoskeletal: Negative.  Negative for myalgias.  Skin:  Negative for rash.  Neurological:  Negative for tingling, loss of consciousness and weakness.  Endo/Heme/Allergies:  Negative for polydipsia.     Current Outpatient Medications:    amLODipine (NORVASC) 10 MG tablet, TAKE 1 TABLET BY MOUTH EVERY DAY, Disp: 90 tablet, Rfl: 2   aspirin 81 MG tablet, Take 81 mg by mouth daily., Disp: , Rfl:    atorvastatin (LIPITOR) 40 MG tablet, TAKE 1 TABLET DAILY, Disp: 90 tablet, Rfl: 2   carvedilol (COREG) 12.5 MG tablet, Take 1 tablet (12.5 mg total) by mouth daily., Disp: 90 tablet, Rfl: 3   clopidogrel (PLAVIX) 75 MG tablet, TAKE 1 TABLET DAILY, Disp: 90 tablet, Rfl: 3   fenofibrate (TRICOR) 145 MG tablet, Take 1 tablet (145 mg total) by mouth daily., Disp: 90 tablet, Rfl: 3   hydrochlorothiazide (HYDRODIURIL) 25 MG tablet, Take 1 tablet (25 mg total) by mouth daily., Disp: 90 tablet, Rfl: 3   isosorbide mononitrate (IMDUR) 60 MG 24 hr tablet, TAKE 1 TABLET BY MOUTH EVERY DAY, Disp: 90 tablet, Rfl: 2   Mesalamine 800 MG TBEC, TAKE 3 TABLETS  (2,'400MG'$     TOTAL) TWO TIMES A DAY, Disp: 180 tablet, Rfl: 11   nitroGLYCERIN (NITROSTAT) 0.4 MG SL tablet, Place 1 tablet (0.4 mg total) under the tongue every 5 (five) minutes as needed for chest pain., Disp: 25 tablet, Rfl: 6   pantoprazole (PROTONIX) 40 MG tablet, TAKE 1 TABLET DAILY, Disp: 90 tablet, Rfl: 1   FARXIGA 5 MG TABS tablet, TAKE 1 TABLET DAILY BEFORE BREAKFAST, Disp: 90 tablet, Rfl: 1   pramipexole (MIRAPEX) 1 MG tablet, TAKE 1 TABLET AT BEDTIME, Disp: 90 tablet, Rfl: 1   simethicone (GAS-X) 80 MG chewable tablet, Chew 1 tablet (80 mg total) by mouth every 6 (six) hours as needed for flatulence., Disp: 30 tablet, Rfl: 0  Current Facility-Administered Medications:    ipratropium-albuterol (DUONEB) 0.5-2.5 (3) MG/3ML nebulizer solution 3 mL, 3 mL, Nebulization, Once, Nafziger, Tommi Rumps, NP   Objective:     BP 110/68 (BP Location: Left Arm, Patient Position: Sitting, Cuff Size: Normal)   Pulse 62   Temp (!) 96.6 F (35.9 C) (Temporal)   Ht '6\' 3"'$  (1.905 m)   Wt 213 lb 9.6 oz (96.9 kg)   SpO2 96%   BMI 26.70 kg/m  Wt Readings from Last 3 Encounters:  09/17/22 216 lb (98 kg)  09/10/22 213 lb 9.6 oz (96.9 kg)  09/10/22 212 lb (96.2 kg)      Physical Exam Constitutional:  General: He is not in acute distress.    Appearance: Normal appearance. He is not ill-appearing, toxic-appearing or diaphoretic.  HENT:     Head: Normocephalic and atraumatic.     Right Ear: External ear normal.     Left Ear: External ear normal.     Mouth/Throat:     Mouth: Mucous membranes are moist.     Pharynx: Oropharynx is clear. No oropharyngeal exudate or posterior oropharyngeal erythema.  Eyes:     General: No scleral icterus.       Right eye: No discharge.        Left eye: No discharge.     Extraocular Movements: Extraocular movements intact.     Conjunctiva/sclera: Conjunctivae normal.     Pupils: Pupils are equal, round, and reactive to light.  Neck:     Thyroid: Thyromegaly (Right  lobe of thyroid greater than left.) present.  Cardiovascular:     Rate and Rhythm: Normal rate and regular rhythm.  Pulmonary:     Effort: Pulmonary effort is normal. No respiratory distress.     Breath sounds: Normal breath sounds.  Abdominal:     General: Bowel sounds are normal.  Musculoskeletal:     Cervical back: No rigidity or tenderness.  Lymphadenopathy:     Cervical: No cervical adenopathy.  Skin:    General: Skin is warm and dry.  Neurological:     Mental Status: He is alert and oriented to person, place, and time.  Psychiatric:        Mood and Affect: Mood normal.        Behavior: Behavior normal.      Results for orders placed or performed in visit on 16/10/96  Basic metabolic panel  Result Value Ref Range   Sodium 140 135 - 145 mEq/L   Potassium 4.0 3.5 - 5.1 mEq/L   Chloride 102 96 - 112 mEq/L   CO2 27 19 - 32 mEq/L   Glucose, Bld 117 (H) 70 - 99 mg/dL   BUN 24 (H) 6 - 23 mg/dL   Creatinine, Ser 1.32 0.40 - 1.50 mg/dL   GFR 49.60 (L) >60.00 mL/min   Calcium 9.7 8.4 - 10.5 mg/dL  Hemoglobin A1c  Result Value Ref Range   Hgb A1c MFr Bld 6.3 4.6 - 6.5 %  Lipid panel  Result Value Ref Range   Cholesterol 178 0 - 200 mg/dL   Triglycerides 110.0 0.0 - 149.0 mg/dL   HDL 52.20 >39.00 mg/dL   VLDL 22.0 0.0 - 40.0 mg/dL   LDL Cholesterol 104 (H) 0 - 99 mg/dL   Total CHOL/HDL Ratio 3    NonHDL 125.96   TSH  Result Value Ref Range   TSH 2.41 0.35 - 5.50 uIU/mL      The ASCVD Risk score (Arnett DK, et al., 2019) failed to calculate for the following reasons:   The 2019 ASCVD risk score is only valid for ages 61 to 76    Assessment & Plan:   Stage 3a chronic kidney disease ([place]) -     Basic metabolic panel  Pre-diabetes -     Basic metabolic panel -     Hemoglobin A1c  Elevated LDL cholesterol level -     Lipid panel  Thyromegaly -     TSH -     US THYROID; Future  Thyroid nodule -     US THYROID; Future    Return in about 6 months  (around 03/11/2023), or if symptoms worsen or  fail to improve.    Libby Maw, MD

## 2022-09-17 ENCOUNTER — Encounter: Payer: Self-pay | Admitting: Vascular Surgery

## 2022-09-17 ENCOUNTER — Ambulatory Visit (INDEPENDENT_AMBULATORY_CARE_PROVIDER_SITE_OTHER): Payer: Federal, State, Local not specified - PPO | Admitting: Vascular Surgery

## 2022-09-17 VITALS — BP 103/64 | HR 60 | Temp 97.9°F | Resp 20 | Ht 75.0 in | Wt 216.0 lb

## 2022-09-17 DIAGNOSIS — I872 Venous insufficiency (chronic) (peripheral): Secondary | ICD-10-CM

## 2022-09-17 NOTE — Progress Notes (Signed)
REASON FOR VISIT:   Increased pain and swelling in the left lower extremity.  MEDICAL ISSUES:   CHRONIC VENOUS INSUFFICIENCY: This patient presents with leg swelling.  He does not have any evidence of DVT.  He has a short segment of reflux in the anterior accessory saphenous vein but I do not think that addressing this would significantly impact his swelling.  Likewise he has a short segment of reflux in the small saphenous vein however the vein here was quite small and I do not think this is contributing.  His previously ablated great saphenous vein remains closed.  Certainly it is possible that he has a May-Thurner syndrome but currently given the degree of swelling I would not recommend a CT venogram or aggressive workup unless the swelling worsens.  We have again discussed the importance of leg elevation.  He does have a leg wedge which positions his leg effectively.  He will try his thigh-high stocking with a gradient of 20 to 30 mmHg.  I encouraged him to exercise and avoid prolonged sitting and standing.  I will plan on seeing him back in 6 months we will try to get an iliac vein duplex at that time.   HPI:   Kristopher Wilson is a pleasant 87 y.o. male who underwent laser ablation of the left great saphenous vein and 10-20 stab phlebectomies on 07/09/2021.  Over the last month patient is developed increasing swelling in the left leg.  He describes an aching pain in the left leg with standing which is relieved with elevation.  He does elevate his leg and this does improve the swelling significantly.  He has been wearing a knee-high compression stocking.  Given the recurrent symptoms he came in for further evaluation.  He denies any chest pain or shortness of breath.  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   PROSTATE CANCER, HX OF 07/07/2007   s/p prostatectomy   RLS (restless legs syndrome)    S/P skin biopsy 06/12/2019   Ulcer    ULCERATIVE COLITIS, LEFT SIDED 2000   URTICARIA, ALLERGIC 08/05/2009   VENOUS INSUFFICIENCY 07/28/2010    Family History  Problem Relation Age of Onset   Heart attack Father    Throat cancer Brother    Multiple sclerosis Brother    Multiple sclerosis Son    Diabetes Maternal Uncle        x 2   Diabetes Maternal Uncle    Colon cancer Neg Hx    Stroke Neg Hx     SOCIAL HISTORY: Social History   Tobacco Use   Smoking status: Former    Types: Cigarettes    Quit date: 09/08/1975    Years since quitting: 47.0   Smokeless tobacco: Never  Substance Use Topics   Alcohol use: Yes  Alcohol/week: 15.0 standard drinks of alcohol    Types: 15 Standard drinks or equivalent per week    Comment: 1-2 "high balls" daily    Allergies  Allergen Reactions   Cephalexin Hives    Current Outpatient Medications  Medication Sig Dispense Refill   amLODipine (NORVASC) 10 MG tablet TAKE 1 TABLET BY MOUTH EVERY DAY 90 tablet 2   aspirin 81 MG tablet Take 81 mg by mouth daily.     atorvastatin (LIPITOR) 40 MG tablet TAKE 1 TABLET DAILY 90 tablet 2   carvedilol (COREG) 12.5 MG tablet Take 1 tablet (12.5 mg total) by mouth daily. 90 tablet 3   clopidogrel (PLAVIX) 75 MG tablet TAKE 1 TABLET DAILY 90 tablet 3   dapagliflozin propanediol (FARXIGA) 5 MG TABS tablet Take 1 tablet (5 mg total) by mouth daily before breakfast. 90 tablet 1   fenofibrate (TRICOR) 145 MG tablet  Take 1 tablet (145 mg total) by mouth daily. 90 tablet 3   hydrochlorothiazide (HYDRODIURIL) 25 MG tablet Take 1 tablet (25 mg total) by mouth daily. 90 tablet 3   isosorbide mononitrate (IMDUR) 60 MG 24 hr tablet TAKE 1 TABLET BY MOUTH EVERY DAY 90 tablet 2   Mesalamine 800 MG TBEC TAKE 3 TABLETS (2,'400MG'$     TOTAL) TWO TIMES A DAY 180 tablet 11   nitroGLYCERIN (NITROSTAT) 0.4 MG SL tablet Place 1 tablet (0.4 mg total) under the tongue every 5 (five) minutes as needed for chest pain. 25 tablet 6   pantoprazole (PROTONIX) 40 MG tablet TAKE 1 TABLET DAILY 90 tablet 1   pramipexole (MIRAPEX) 1 MG tablet Take 1 tablet (1 mg total) by mouth at bedtime. 90 tablet 1   simethicone (GAS-X) 80 MG chewable tablet Chew 1 tablet (80 mg total) by mouth every 6 (six) hours as needed for flatulence. 30 tablet 0   Current Facility-Administered Medications  Medication Dose Route Frequency Provider Last Rate Last Admin   ipratropium-albuterol (DUONEB) 0.5-2.5 (3) MG/3ML nebulizer solution 3 mL  3 mL Nebulization Once Nafziger, Cory, NP        REVIEW OF SYSTEMS:  '[X]'$  denotes positive finding, '[ ]'$  denotes negative finding Cardiac  Comments:  Chest pain or chest pressure:    Shortness of breath upon exertion:    Short of breath when lying flat:    Irregular heart rhythm:        Vascular    Pain in calf, thigh, or hip brought on by ambulation:    Pain in feet at night that wakes you up from your sleep:     Blood clot in your veins:    Leg swelling:         Pulmonary    Oxygen at home:    Productive cough:     Wheezing:         Neurologic    Sudden weakness in arms or legs:     Sudden numbness in arms or legs:     Sudden onset of difficulty speaking or slurred speech:    Temporary loss of vision in one eye:     Problems with dizziness:         Gastrointestinal    Blood in stool:     Vomited blood:         Genitourinary    Burning when urinating:     Blood in urine:        Psychiatric     Major depression:  Hematologic    Bleeding problems:    Problems with blood clotting too easily:        Skin    Rashes or ulcers:        Constitutional    Fever or chills:     PHYSICAL EXAM:   Vitals:   09/17/22 0923  BP: 103/64  Pulse: 60  Resp: 20  Temp: 97.9 F (36.6 C)  SpO2: 93%  Weight: 216 lb (98 kg)  Height: '6\' 3"'$  (1.905 m)    GENERAL: The patient is a well-nourished male, in no acute distress. The vital signs are documented above. CARDIAC: There is a regular rate and rhythm.  VASCULAR: I do not detect any carotid bruits. He has palpable pedal pulses. The left leg is swollen up to the knee and into the thigh slightly. I did look at the left anterior accessory saphenous vein myself and it does have reflux that is dilated but is a fairly short segment. Currently he does not have any large varicosities along the anterior lateral aspect of the left thigh to suggest that the reflux in the anterior accessory saphenous vein is problematic. PULMONARY: There is good air exchange bilaterally without wheezing or rales. ABDOMEN: Soft and non-tender with normal pitched bowel sounds.  MUSCULOSKELETAL: There are no major deformities or cyanosis. NEUROLOGIC: No focal weakness or paresthesias are detected. SKIN: There are no ulcers or rashes noted. PSYCHIATRIC: The patient has a normal affect.  DATA:    I reviewed his venous duplex scan that was done on 09/10/2021.  This was of the left lower extremity only.  There was no evidence of DVT.  There was deep venous reflux in the common femoral vein.  This previously ablated great saphenous vein remains closed.  He does have a anterior accessory saphenous vein with reflux which measures about 5 mm in diameter.  There is a short segment of reflux in the small saphenous vein although this vein is not dilated.  The results of the study are summarized on the diagram below.    Deitra Mayo [facility] [phone]

## 2022-09-23 ENCOUNTER — Other Ambulatory Visit: Payer: Self-pay

## 2022-09-23 DIAGNOSIS — I872 Venous insufficiency (chronic) (peripheral): Secondary | ICD-10-CM

## 2022-09-26 ENCOUNTER — Other Ambulatory Visit: Payer: Self-pay | Admitting: Family Medicine

## 2022-09-26 DIAGNOSIS — N1831 Chronic kidney disease, stage 3a: Secondary | ICD-10-CM

## 2022-09-26 DIAGNOSIS — G2581 Restless legs syndrome: Secondary | ICD-10-CM

## 2022-10-01 ENCOUNTER — Inpatient Hospital Stay: Admission: RE | Admit: 2022-10-01 | Payer: Federal, State, Local not specified - PPO | Source: Ambulatory Visit

## 2022-10-05 ENCOUNTER — Ambulatory Visit
Admission: RE | Admit: 2022-10-05 | Discharge: 2022-10-05 | Disposition: A | Discharge status: Home / Self Care | Payer: Federal, State, Local not specified - PPO | Source: Ambulatory Visit | Attending: Family Medicine | Admitting: Family Medicine

## 2022-10-05 DIAGNOSIS — E010 Iodine-deficiency related diffuse (endemic) goiter: Secondary | ICD-10-CM

## 2022-10-06 NOTE — Addendum Note (Signed)
Addended by: Jon Billings on: 10/06/2022 08:16 AM   Modules accepted: Orders

## 2022-10-09 ENCOUNTER — Other Ambulatory Visit: Payer: Self-pay | Admitting: Cardiology

## 2022-11-06 IMAGING — CR DG CHEST 2V
2 series · 2 of 2 positions shown · non-contrast
Comparison: 08/31/2019

CLINICAL DATA: Chest pain

EXAM:
CHEST - 2 VIEW

[chest pa]
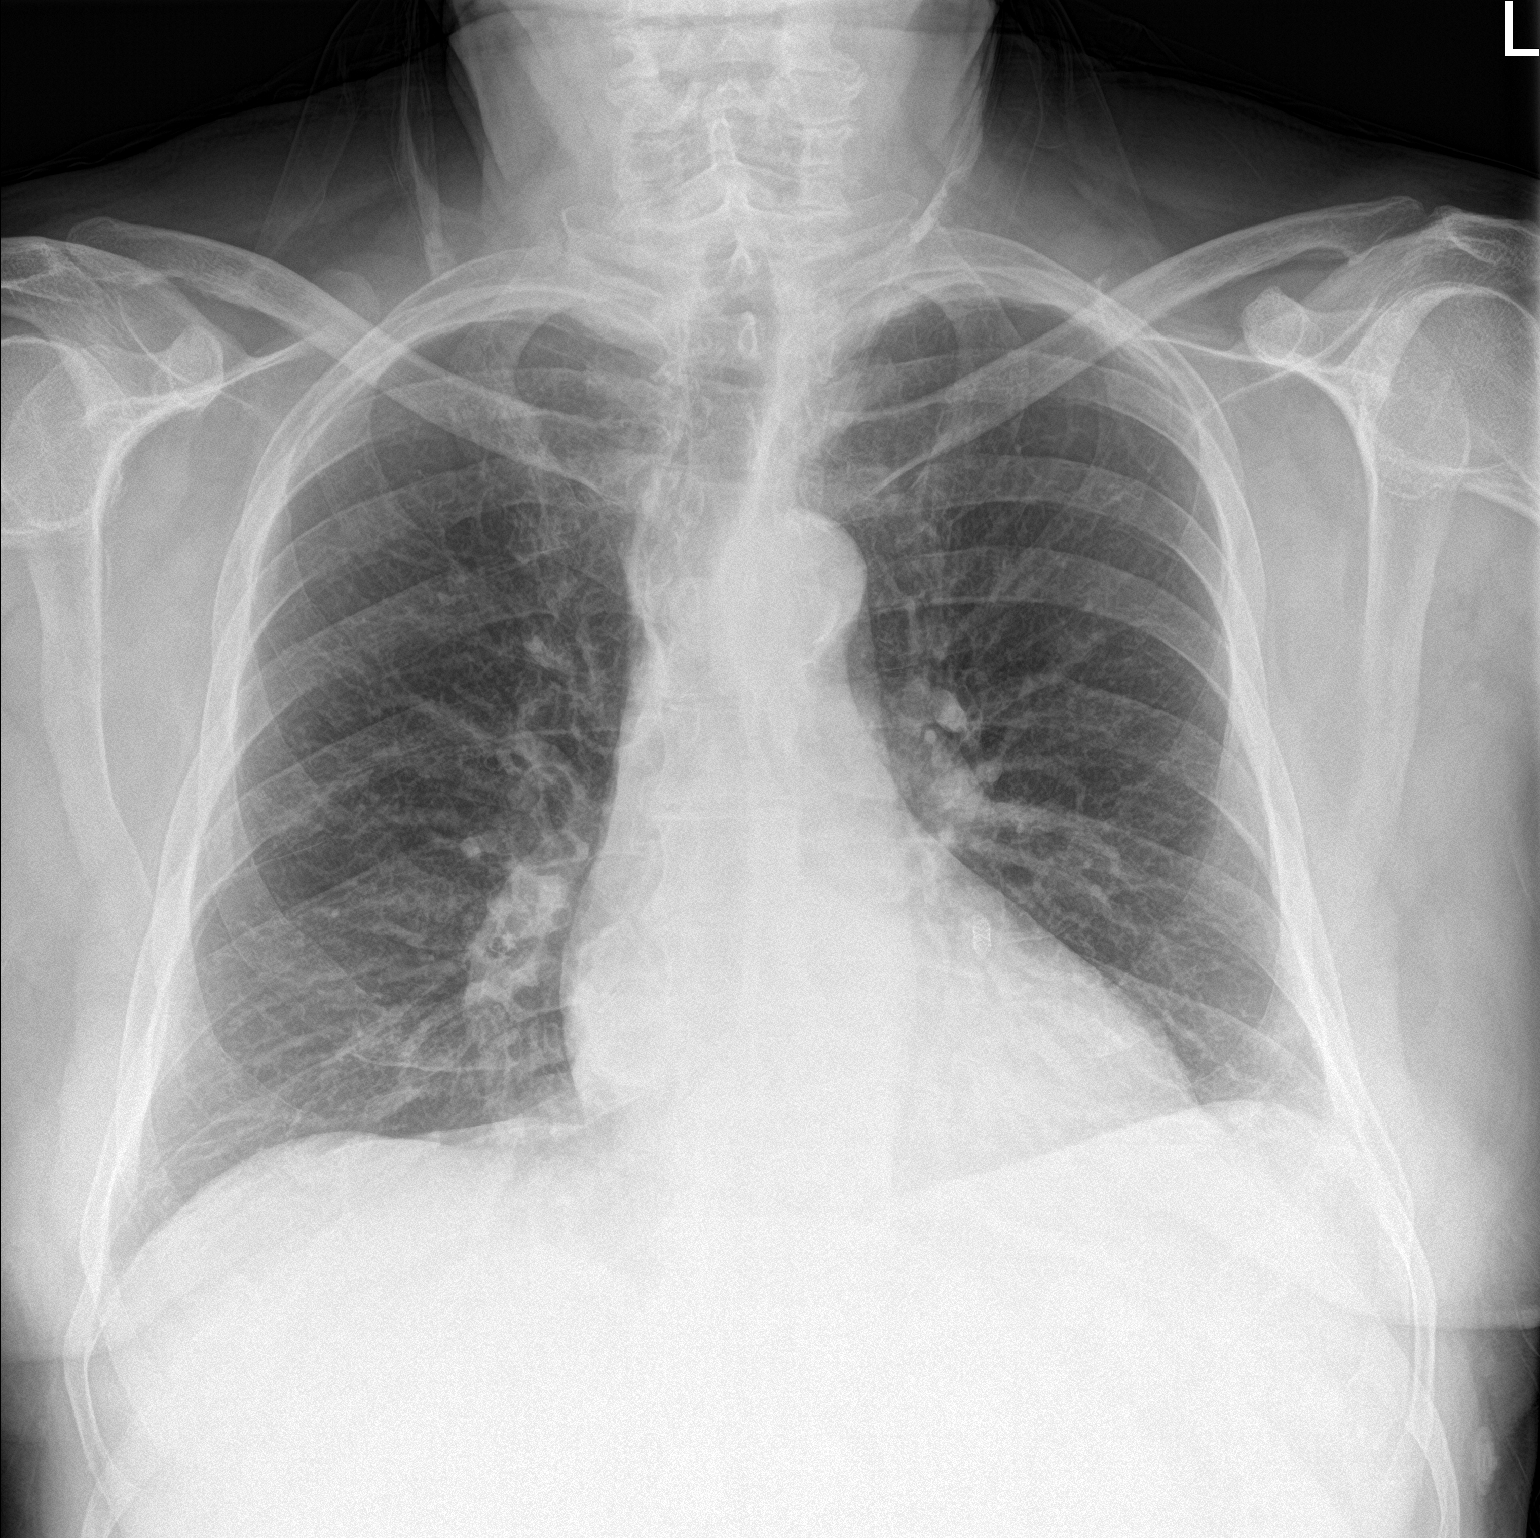

[chest lat]
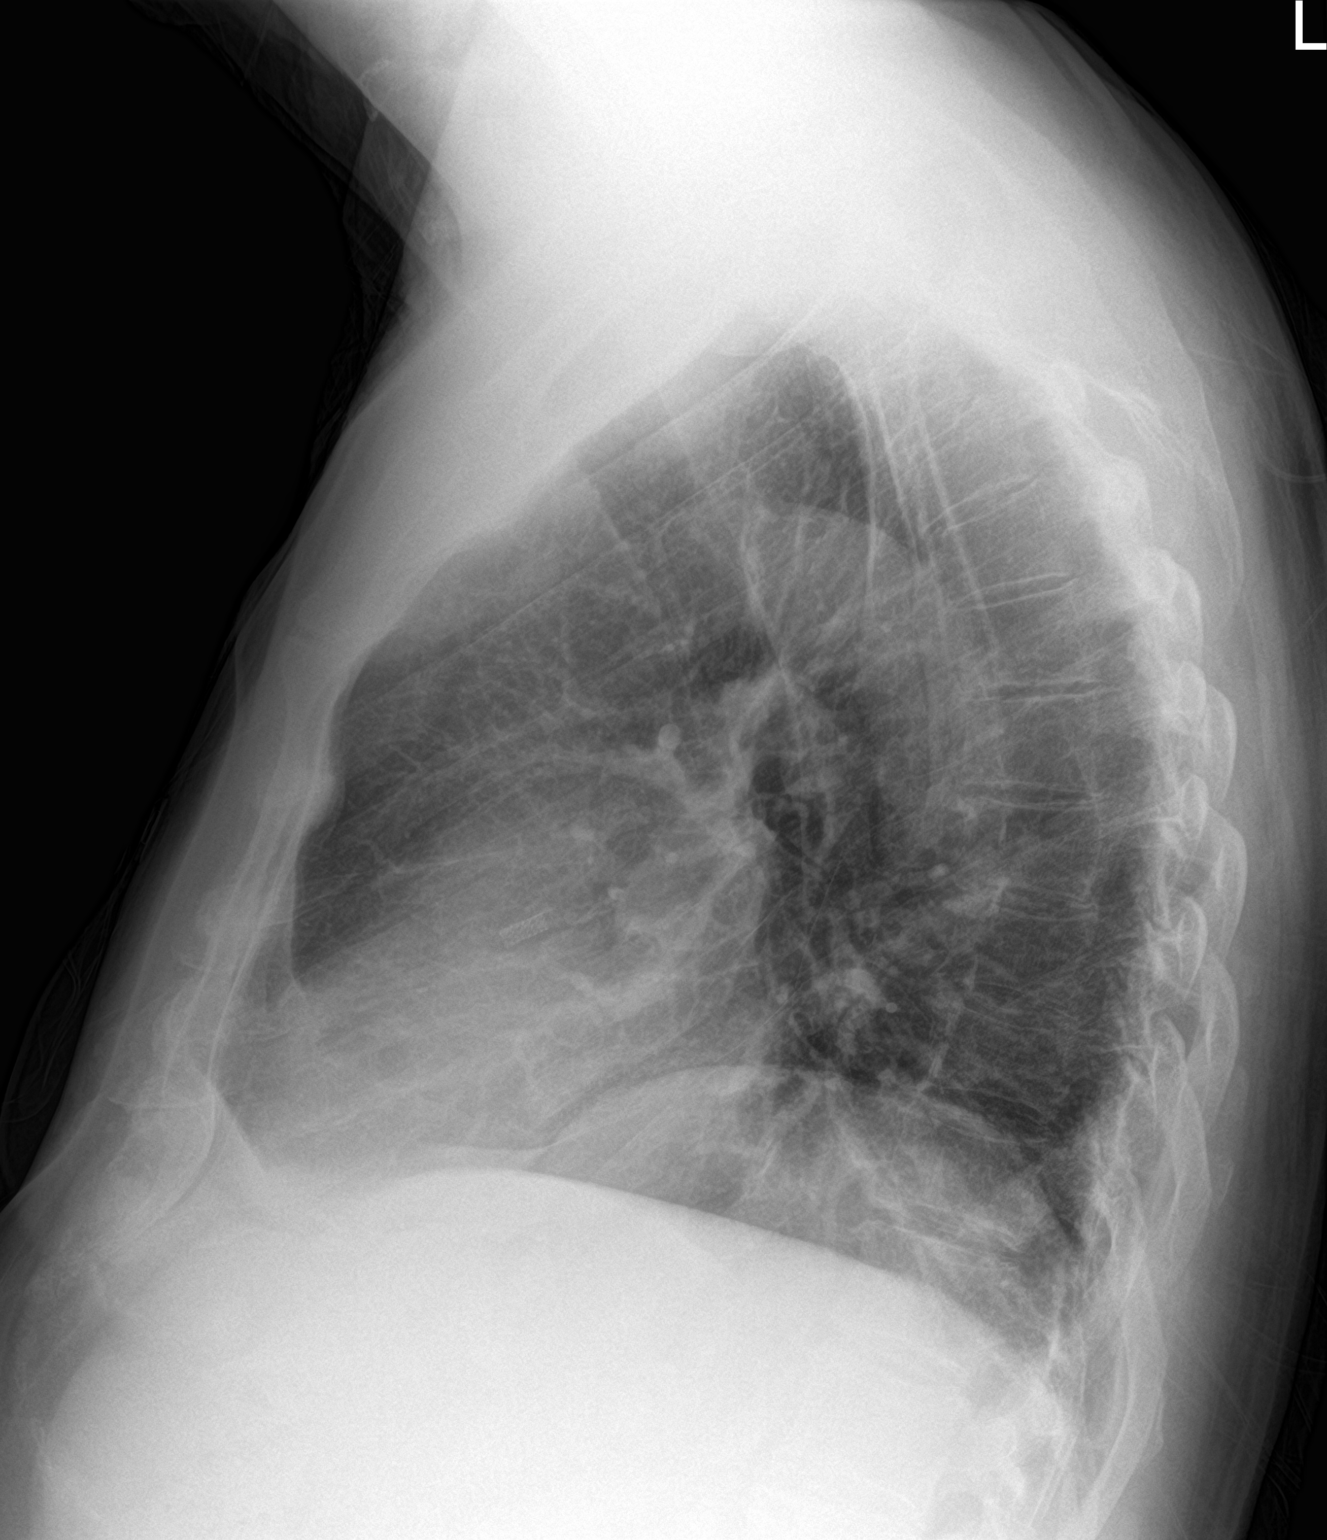

[2 of 2 positions shown; findings below may reference images not displayed]

FINDINGS: The lungs are clear without focal pneumonia, edema, pneumothorax or
pleural effusion. Similar streaky opacity at the bases suggest
atelectasis or scarring. The cardiopericardial silhouette is within
normal limits for size. The visualized bony structures of the thorax
are unremarkable.
IMPRESSION: No active cardiopulmonary disease.

## 2022-11-07 ENCOUNTER — Other Ambulatory Visit: Payer: Self-pay | Admitting: Cardiology

## 2022-11-08 IMAGING — MR MR LUMBAR SPINE W/O CM
4 of 5 series · 26 of 48 positions shown · non-contrast
Comparison: None Available.

CLINICAL DATA: Nontraumatic ataxia with lumbar pathology suspected

EXAM:
MRI LUMBAR SPINE WITHOUT CONTRAST
TECHNIQUE: Multiplanar, multisequence MR imaging of the lumbar spine was
performed. No intravenous contrast was administered.

[Series 3: T2 · sagittal · 4.0mm · 1.09mm/px · 5 of 15 slices shown (1 of 2)]
[im 1/15]
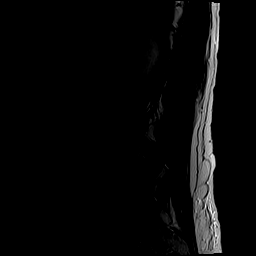
[im 4/15]
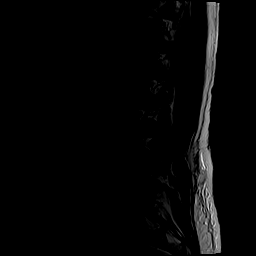
[im 8/15]
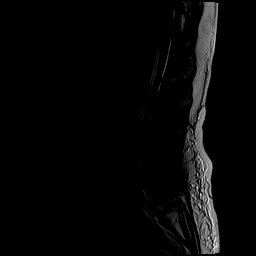
[im 11/15]
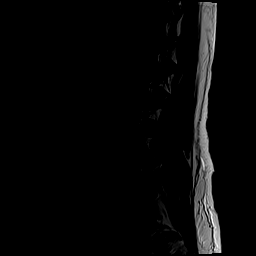
[im 15/15]
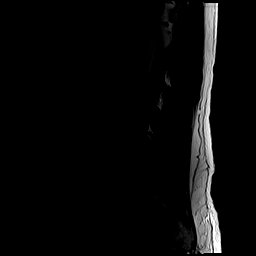

[Series 5: T1 · sagittal · 4.0mm · 1.09mm/px · 6 of 15 slices shown (1 of 2)]
[im 1/15]
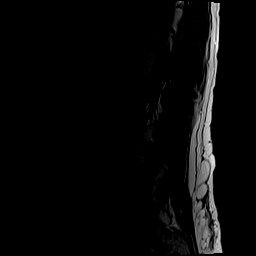
[im 3/15]
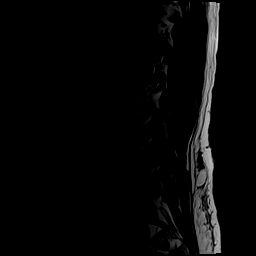
[im 6/15]
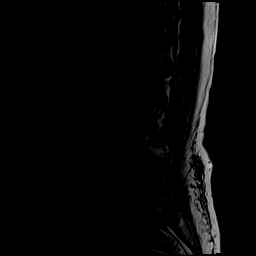
[im 9/15]
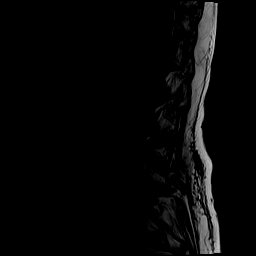
[im 12/15]
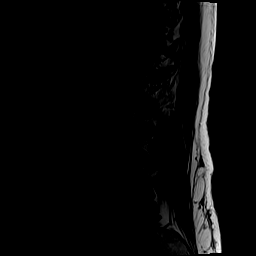
[im 15/15]
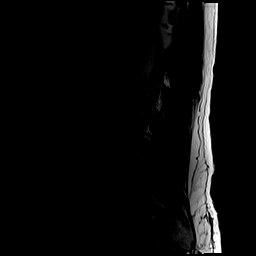

[Series 6: T2 · axial · 4.0mm · 0.39mm/px · z∈[+16,+241]mm · 10 of 43 slices shown (2 of 2)]
[im 3/43]
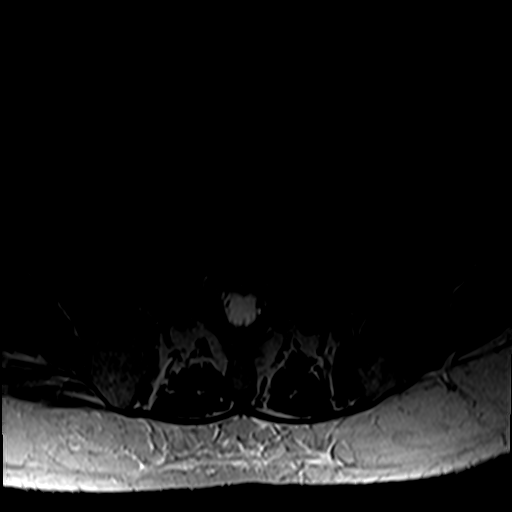
[im 6/43]
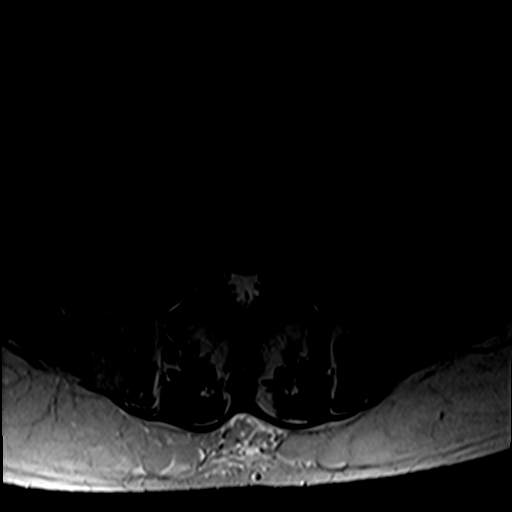
[im 9/43]
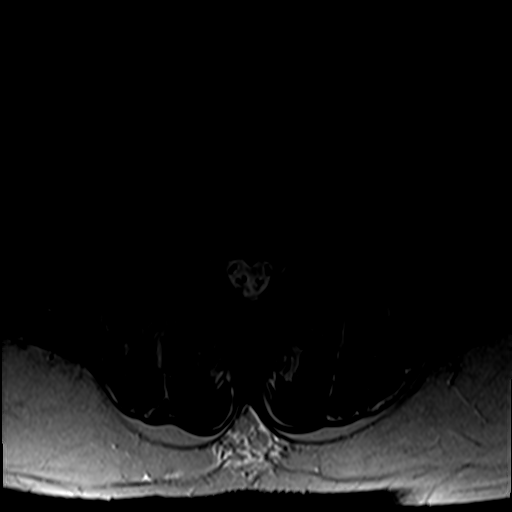
[im 15/43]
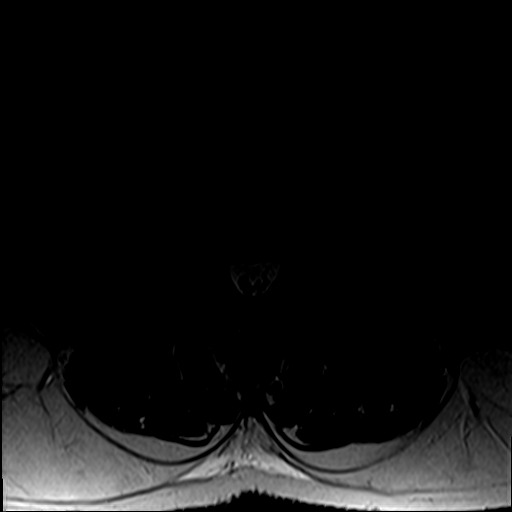
[im 20/43]
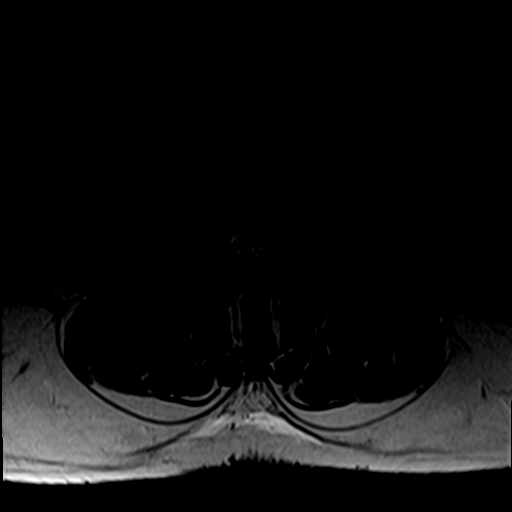
[im 23/43]
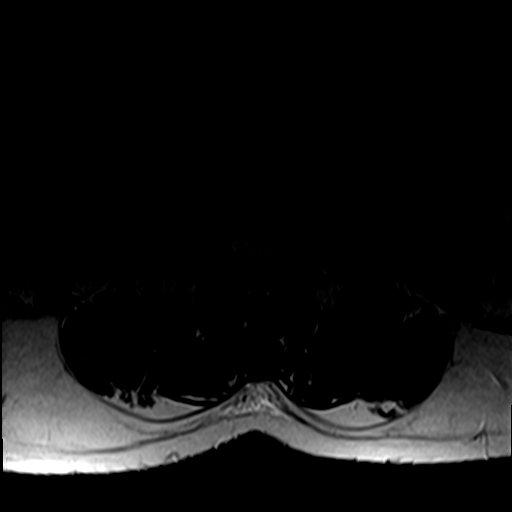
[im 26/43]
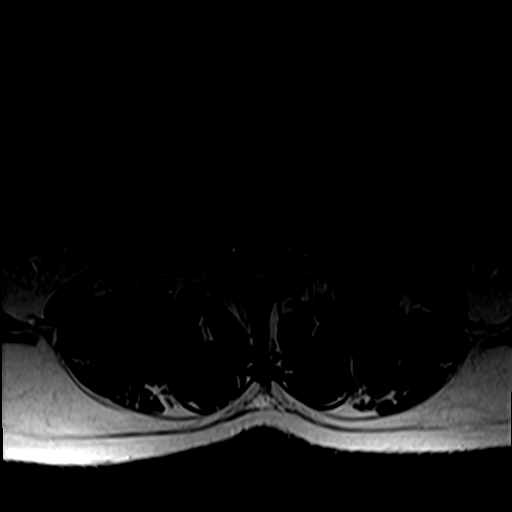
[im 31/43]
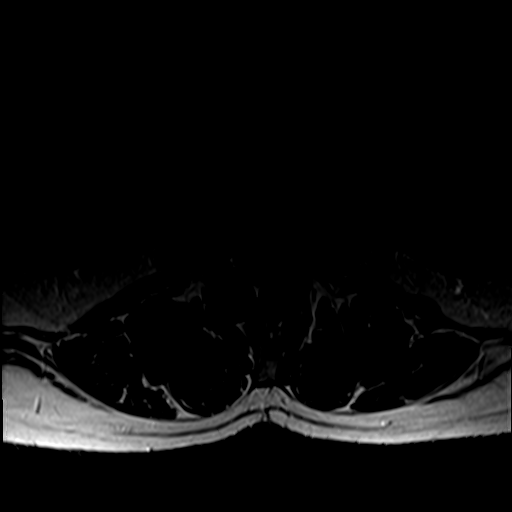
[im 37/43]
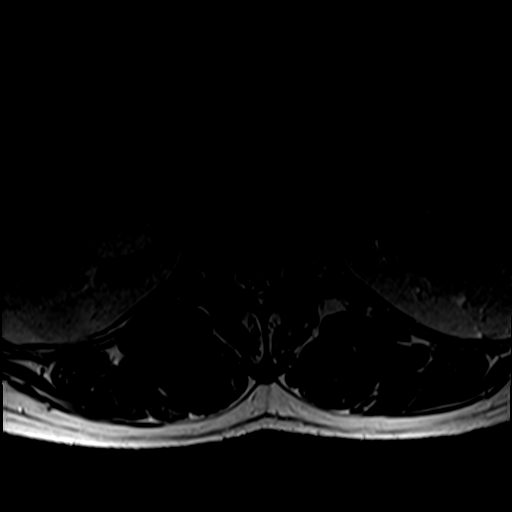
[im 43/43]
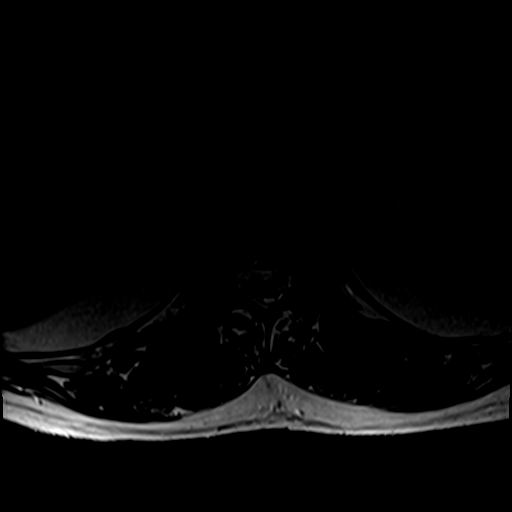

[Series 7: T1 · axial · 4.0mm · 0.39mm/px · z∈[+48,+225]mm · 5 of 42 slices shown (2 of 2)]
[im 3/42]
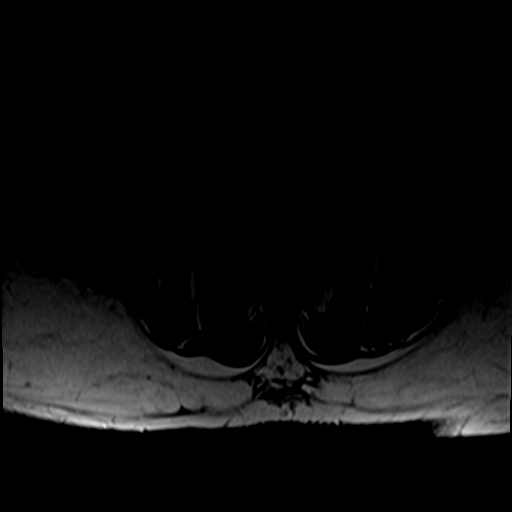
[im 6/42]
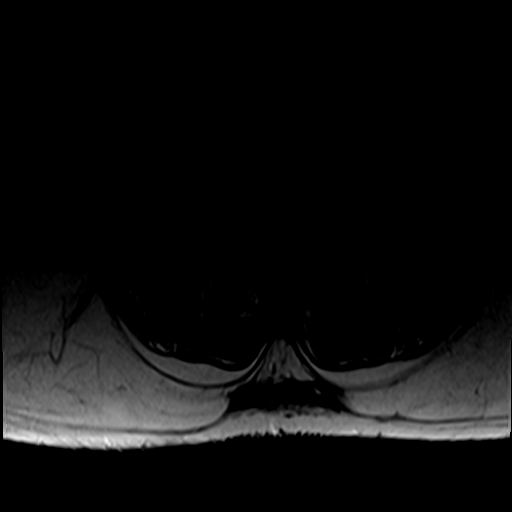
[im 9/42]
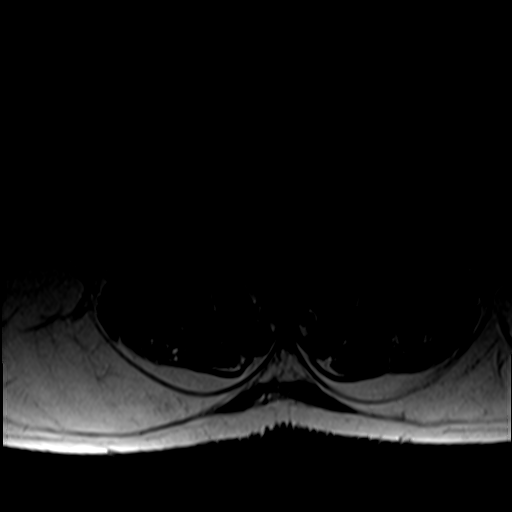
[im 22/42]
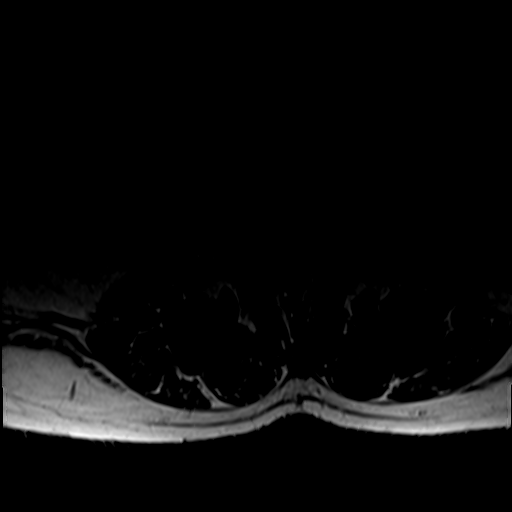
[im 36/42]
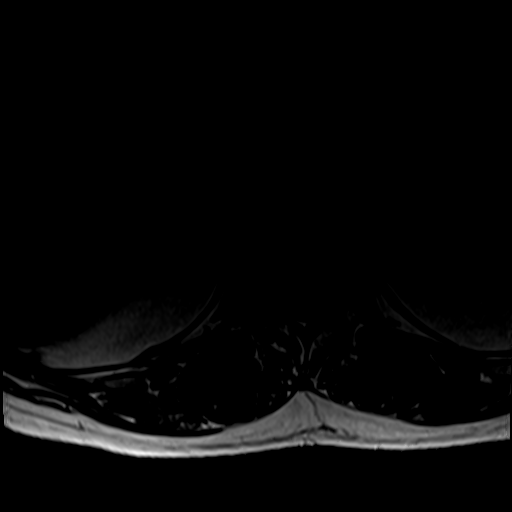

[26 of 48 positions shown; findings below may reference images not displayed]

FINDINGS: Segmentation:  Standard.

Alignment:  Slight anterolisthesis at L4-5

Vertebrae:  No fracture, evidence of discitis, or bone lesion.

Conus medullaris and cauda equina: Conus extends to the L1-2 level.
Conus and cauda equina appear normal.

Paraspinal and other soft tissues: Bilateral renal cystic
intensities.

Disc levels:

L1-L2: Mild disc desiccation with narrowing and minor bulging. No
neural impingement

L2-L3: Mild disc bulging.

L3-L4: Mild disc bulging

L4-L5: Bulky degenerative facet spurring with slight
anterolisthesis. Mild disc space narrowing and bulging. Mild right
foraminal narrowing. Moderate thecal sac narrowing with crowding of
both descending L5 nerve roots, but no static neural compression

L5-S1:Degenerative facet spurring with small joint effusions and a
right anterior synovial cyst measuring 4 mm which contacts the
traversing L5 nerve root. Patent spinal canal.
IMPRESSION: 1. Lumbar spine degeneration especially at L4-5 where there is mild
anterolisthesis and moderate spinal stenosis.
2. L5-S1 facet osteoarthritis more notable on the right where a 4 mm
synovial cyst contacts the foraminal L5 nerve root.

## 2022-11-26 ENCOUNTER — Other Ambulatory Visit: Payer: Self-pay | Admitting: Cardiology

## 2022-11-26 DIAGNOSIS — I10 Essential (primary) hypertension: Secondary | ICD-10-CM

## 2022-11-26 DIAGNOSIS — I2510 Atherosclerotic heart disease of native coronary artery without angina pectoris: Secondary | ICD-10-CM

## 2022-11-26 DIAGNOSIS — E7800 Pure hypercholesterolemia, unspecified: Secondary | ICD-10-CM

## 2023-02-18 ENCOUNTER — Other Ambulatory Visit: Payer: Self-pay | Admitting: Cardiology

## 2023-02-18 ENCOUNTER — Other Ambulatory Visit: Payer: Self-pay | Admitting: Family Medicine

## 2023-02-18 DIAGNOSIS — N1831 Chronic kidney disease, stage 3a: Secondary | ICD-10-CM

## 2023-02-19 ENCOUNTER — Telehealth: Payer: Self-pay

## 2023-02-19 ENCOUNTER — Other Ambulatory Visit (HOSPITAL_COMMUNITY): Payer: Self-pay

## 2023-02-19 NOTE — Telephone Encounter (Signed)
Patient Advocate Encounter  Prior Authorization for Pantoprazole Sodium 40MG  dr tablets has been approved through [employer].    KEY [id number]  Effective: 02-19-2023 to 02-19-2024  Pantoprazole Sodium 40MG  dr tablets  Last filled: 02-19-2023 @ [facility] Next fill: 04-28-2023

## 2023-02-27 ENCOUNTER — Other Ambulatory Visit: Payer: Self-pay | Admitting: Cardiology

## 2023-03-09 ENCOUNTER — Encounter: Payer: Self-pay | Admitting: Cardiology

## 2023-03-09 ENCOUNTER — Ambulatory Visit: Payer: Federal, State, Local not specified - PPO | Admitting: Cardiology

## 2023-03-09 VITALS — BP 146/72 | HR 77 | Ht 75.0 in | Wt 217.4 lb

## 2023-03-09 DIAGNOSIS — I872 Venous insufficiency (chronic) (peripheral): Secondary | ICD-10-CM

## 2023-03-09 DIAGNOSIS — E7800 Pure hypercholesterolemia, unspecified: Secondary | ICD-10-CM | POA: Diagnosis not present

## 2023-03-09 DIAGNOSIS — I10 Essential (primary) hypertension: Secondary | ICD-10-CM

## 2023-03-09 DIAGNOSIS — I2510 Atherosclerotic heart disease of native coronary artery without angina pectoris: Secondary | ICD-10-CM | POA: Diagnosis not present

## 2023-03-09 DIAGNOSIS — N1831 Chronic kidney disease, stage 3a: Secondary | ICD-10-CM

## 2023-03-09 NOTE — Patient Instructions (Signed)
Medication Instructions:  Please discontinue your Aspirin. Continue all other medications as listed.  *If you need a refill on your cardiac medications before your next appointment, please call your pharmacy*  Follow-Up: At [facility], you and your health needs are our priority.  As part of our continuing mission to provide you with exceptional heart care, we have created designated Provider Care Teams.  These Care Teams include your primary Cardiologist (physician) and Advanced Practice Providers (APPs -  Physician Assistants and Nurse Practitioners) who all work together to provide you with the care you need, when you need it.  We recommend signing up for the patient portal called "MyChart".  Sign up information is provided on this After Visit Summary.  MyChart is used to connect with patients for Virtual Visits (Telemedicine).  Patients are able to view lab/test results, encounter notes, upcoming appointments, etc.  Non-urgent messages can be sent to your provider as well.   To learn more about what you can do with MyChart, go to [email or web address].    Your next appointment:   1 year(s)  Provider:   Donato Schultz, MD

## 2023-03-09 NOTE — Progress Notes (Signed)
  Cardiology Office Note:  .   Date:  03/09/2023  ID:  Kristopher Wilson, DOB June 12, 1936, MRN [id number] PCP: Mliss Sax, MD  [facility] Providers Cardiologist:  Donato Schultz, MD    History of Present Illness: .   Kristopher Wilson is a 87 y.o. male here for the follow-up of coronary artery disease, diastolic heart failure, carotid artery disease, venous insufficiency seen previously by vascular.  Doing well, no chest pain no shortness of breath no syncope.  Chronic left ankle edema.  Prior injury.  Has postherpetic neuralgia as well.  Wears compression stockings.  Enjoys golf.  Tries to walk daily.  Willing to stop aspirin and take Plavix monotherapy.  ROS: Occasional bruising  Studies Reviewed: Marland Kitchen   EKG Interpretation Date/Time:  Tuesday March 09 2023 10:44:23 EDT Ventricular Rate:  67 PR Interval:  140 QRS Duration:  104 QT Interval:  442 QTC Calculation: 467 R Axis:   74  Text Interpretation: Normal sinus rhythm Normal ECG When compared with ECG of 26-Jan-2022 05:11, No significant change was found Confirmed by Donato Schultz ([id number]) on 03/09/2023 10:55:35 AM   Bare-metal stent to LAD in 2000, DES to posterior lateral in 2009.  Nuclear stress test 2022 negative for ischemia.  2022-mild carotid disease bilaterally  Risk Assessment/Calculations:           Physical Exam:   VS:  BP (!) 146/72   Pulse 77   Ht 6\' 3"  (1.905 m)   Wt 217 lb 6.4 oz (98.6 kg)   SpO2 96%   BMI 27.17 kg/m    Wt Readings from Last 3 Encounters:  03/09/23 217 lb 6.4 oz (98.6 kg)  09/17/22 216 lb (98 kg)  09/10/22 213 lb 9.6 oz (96.9 kg)    GEN: Well nourished, well developed in no acute distress NECK: No JVD; No carotid bruits CARDIAC: RRR, no murmurs, rubs, gallops RESPIRATORY:  Clear to auscultation without rales, wheezing or rhonchi  ABDOMEN: Soft, non-tender, non-distended EXTREMITIES: Left lower extremity ankle edema; No deformity   ASSESSMENT AND PLAN: .    Coronary artery  disease with angina, well treated isosorbide 60 - LAD stent 2000, posterior lateral stent 2009. - Nuclear stress test 2022 low risk.  Doing well no anginal symptoms.  With bruising, consider Plavix monotherapy.  Has been continuing aspirin and Plavix together. Will stop ASA 81.  Hyperlipidemia - On atorvastatin 40 mg, LDL 68 outside labs, most recent 104.  He stated that it may have been elevated because of a cruise. -On fibrate as well.  Carotid artery disease - Mild in March 2022.  As needed  Primary hypertension - Continue to watch salt.  Monitoring blood pressure.  On isosorbide 60 mg as antianginal. - Atorvastatin 10 mg - Carvedilol 12.5 mg - Hydrochlorothiazide 25 mg a day Creatinine 1.3 (stage IIIa chronic kidney disease), potassium 4.0 A little bit elevated today.  Continue with exercise.  Venous insufficiency especially left leg - Seeing vascular soon.      Dispo: 12 months  Signed, Donato Schultz, MD

## 2023-03-12 ENCOUNTER — Encounter: Payer: Self-pay | Admitting: Family Medicine

## 2023-03-12 ENCOUNTER — Ambulatory Visit: Payer: Federal, State, Local not specified - PPO | Admitting: Family Medicine

## 2023-03-12 VITALS — BP 124/78 | HR 63 | Temp 96.2°F | Ht 75.0 in | Wt 218.8 lb

## 2023-03-12 DIAGNOSIS — E7800 Pure hypercholesterolemia, unspecified: Secondary | ICD-10-CM | POA: Diagnosis not present

## 2023-03-12 DIAGNOSIS — I10 Essential (primary) hypertension: Secondary | ICD-10-CM

## 2023-03-12 DIAGNOSIS — R7303 Prediabetes: Secondary | ICD-10-CM

## 2023-03-12 DIAGNOSIS — C73 Malignant neoplasm of thyroid gland: Secondary | ICD-10-CM

## 2023-03-12 DIAGNOSIS — N1831 Chronic kidney disease, stage 3a: Secondary | ICD-10-CM | POA: Diagnosis not present

## 2023-03-12 LAB — CBC WITH DIFFERENTIAL/PLATELET
Basophils Absolute: 0 10*3/uL (ref 0.0–0.1)
Basophils Relative: 0.5 % (ref 0.0–3.0)
Eosinophils Absolute: 0.2 10*3/uL (ref 0.0–0.7)
Eosinophils Relative: 3.8 % (ref 0.0–5.0)
HCT: 43.8 % (ref 39.0–52.0)
Hemoglobin: 14.2 g/dL (ref 13.0–17.0)
Lymphocytes Relative: 36.7 % (ref 12.0–46.0)
Lymphs Abs: 1.9 10*3/uL (ref 0.7–4.0)
MCHC: 32.4 g/dL (ref 30.0–36.0)
MCV: 86.6 fl (ref 78.0–100.0)
Monocytes Absolute: 0.4 10*3/uL (ref 0.1–1.0)
Monocytes Relative: 8.7 % (ref 3.0–12.0)
Neutro Abs: 2.6 10*3/uL (ref 1.4–7.7)
Neutrophils Relative %: 50.3 % (ref 43.0–77.0)
Platelets: 236 10*3/uL (ref 150.0–400.0)
RBC: 5.06 Mil/uL (ref 4.22–5.81)
RDW: 14.5 % (ref 11.5–15.5)
WBC: 5.1 10*3/uL (ref 4.0–10.5)

## 2023-03-12 LAB — LDL CHOLESTEROL, DIRECT: Direct LDL: 100 mg/dL

## 2023-03-12 LAB — BASIC METABOLIC PANEL
BUN: 19 mg/dL (ref 6–23)
CO2: 26 mEq/L (ref 19–32)
Calcium: 9.5 mg/dL (ref 8.4–10.5)
Chloride: 102 mEq/L (ref 96–112)
Creatinine, Ser: 1.15 mg/dL (ref 0.40–1.50)
GFR: 58.32 mL/min — ABNORMAL LOW (ref >60.00)
Glucose, Bld: 101 mg/dL — ABNORMAL HIGH (ref 70–99)
Potassium: 4.3 mEq/L (ref 3.5–5.1)
Sodium: 137 mEq/L (ref 135–145)

## 2023-03-12 LAB — HEMOGLOBIN A1C: Hgb A1c MFr Bld: 6.3 % (ref 4.6–6.5)

## 2023-03-12 LAB — LIPID PANEL
Cholesterol: 204 mg/dL — ABNORMAL HIGH (ref 0–200)
HDL: 43.2 mg/dL (ref >39.00)
NonHDL: 160.31
Total CHOL/HDL Ratio: 5
Triglycerides: 267 mg/dL — ABNORMAL HIGH (ref 0.0–149.0)
VLDL: 53.4 mg/dL — ABNORMAL HIGH (ref 0.0–40.0)

## 2023-03-12 NOTE — Progress Notes (Signed)
Established Patient Office Visit   Subjective:  Patient ID: Kristopher Wilson, male    DOB: 1935/09/23  Age: 87 y.o. MRN: [id number]  Chief Complaint  Patient presents with   Medical Management of Chronic Issues    6 month follow up. Pt is fasting.     HPI Encounter Diagnoses  Name Primary?   Stage 3a chronic kidney disease (HCC) Yes   Pre-diabetes    Elevated LDL cholesterol level    Essential hypertension    doing okay.  He is exercising by playing golf.  He does have regular dental care.  Wife recently had extensive dermatological work for precancers.   Review of Systems  Constitutional: Negative.   HENT: Negative.    Eyes:  Negative for blurred vision, discharge and redness.  Respiratory: Negative.    Cardiovascular: Negative.   Gastrointestinal:  Negative for abdominal pain.  Genitourinary: Negative.   Musculoskeletal: Negative.  Negative for myalgias.  Skin:  Negative for rash.  Neurological:  Negative for tingling, loss of consciousness and weakness.  Endo/Heme/Allergies:  Negative for polydipsia.     Current Outpatient Medications:    amLODipine (NORVASC) 10 MG tablet, TAKE 1 TABLET BY MOUTH EVERY DAY, Disp: 90 tablet, Rfl: 2   atorvastatin (LIPITOR) 40 MG tablet, TAKE 1 TABLET DAILY, Disp: 90 tablet, Rfl: 2   carvedilol (COREG) 12.5 MG tablet, TAKE 1 TABLET DAILY, Disp: 90 tablet, Rfl: 2   clopidogrel (PLAVIX) 75 MG tablet, TAKE 1 TABLET DAILY, Disp: 90 tablet, Rfl: 1   dapagliflozin propanediol (FARXIGA) 5 MG TABS tablet, TAKE 1 TABLET DAILY BEFORE BREAKFAST, Disp: 90 tablet, Rfl: 1   fenofibrate (TRICOR) 145 MG tablet, TAKE 1 TABLET DAILY, Disp: 90 tablet, Rfl: 2   hydrochlorothiazide (HYDRODIURIL) 25 MG tablet, TAKE 1 TABLET DAILY, Disp: 90 tablet, Rfl: 1   isosorbide mononitrate (IMDUR) 60 MG 24 hr tablet, TAKE 1 TABLET BY MOUTH EVERY DAY, Disp: 90 tablet, Rfl: 1   Mesalamine 800 MG TBEC, TAKE 3 TABLETS (2,400MG     TOTAL) TWO TIMES A DAY, Disp: 180 tablet,  Rfl: 11   nitroGLYCERIN (NITROSTAT) 0.4 MG SL tablet, Place 1 tablet (0.4 mg total) under the tongue every 5 (five) minutes as needed for chest pain., Disp: 25 tablet, Rfl: 6   pantoprazole (PROTONIX) 40 MG tablet, TAKE 1 TABLET DAILY, Disp: 90 tablet, Rfl: 1   pramipexole (MIRAPEX) 1 MG tablet, TAKE 1 TABLET AT BEDTIME, Disp: 90 tablet, Rfl: 1   simethicone (GAS-X) 80 MG chewable tablet, Chew 1 tablet (80 mg total) by mouth every 6 (six) hours as needed for flatulence., Disp: 30 tablet, Rfl: 0  Current Facility-Administered Medications:    ipratropium-albuterol (DUONEB) 0.5-2.5 (3) MG/3ML nebulizer solution 3 mL, 3 mL, Nebulization, Once, Nafziger, Kandee Keen, NP   Objective:     BP 124/78   Pulse 63   Temp (!) 96.2 F (35.7 C)   Ht 6\' 3"  (1.905 m)   Wt 218 lb 12.8 oz (99.2 kg)   BMI 27.35 kg/m  BP Readings from Last 3 Encounters:  03/12/23 124/78  03/09/23 (!) 146/72  09/17/22 103/64   Wt Readings from Last 3 Encounters:  03/12/23 218 lb 12.8 oz (99.2 kg)  03/09/23 217 lb 6.4 oz (98.6 kg)  09/17/22 216 lb (98 kg)      Physical Exam Constitutional:      General: He is not in acute distress.    Appearance: Normal appearance. He is not ill-appearing, toxic-appearing or diaphoretic.  HENT:  Head: Normocephalic and atraumatic.     Right Ear: External ear normal.     Left Ear: External ear normal.     Mouth/Throat:     Mouth: Mucous membranes are moist.     Pharynx: Oropharynx is clear. No oropharyngeal exudate or posterior oropharyngeal erythema.  Eyes:     General: No scleral icterus.       Right eye: No discharge.        Left eye: No discharge.     Extraocular Movements: Extraocular movements intact.     Conjunctiva/sclera: Conjunctivae normal.     Pupils: Pupils are equal, round, and reactive to light.  Cardiovascular:     Rate and Rhythm: Normal rate and regular rhythm.  Pulmonary:     Effort: Pulmonary effort is normal. No respiratory distress.     Breath sounds:  Normal breath sounds.  Abdominal:     General: Bowel sounds are normal.     Tenderness: There is no rebound.  Musculoskeletal:     Cervical back: No rigidity or tenderness.  Skin:    General: Skin is warm and dry.  Neurological:     Mental Status: He is alert and oriented to person, place, and time.  Psychiatric:        Mood and Affect: Mood normal.        Behavior: Behavior normal.      No results found for any visits on 03/12/23.    The ASCVD Risk score (Arnett DK, et al., 2019) failed to calculate for the following reasons:   The 2019 ASCVD risk score is only valid for ages 69 to 106    Assessment & Plan:   Stage 3a chronic kidney disease (HCC) -     Basic metabolic panel -     CBC with Differential/Platelet  Pre-diabetes -     Hemoglobin A1c  Elevated LDL cholesterol level -     Lipid panel  Essential hypertension -     Basic metabolic panel -     CBC with Differential/Platelet    Return in about 6 months (around 09/12/2023), or Please stay hydrated, exercise and lose weight..  May need to increase dapagliflozin.  Encouraged weight loss.  Advised that he avoid sugary drinks.  Advised that he is on the border of being diagnosed with diabetes.  Encouraged exercise by walking for 30 minutes most days weekly.  Mliss Sax, MD

## 2023-03-16 ENCOUNTER — Ambulatory Visit: Payer: Federal, State, Local not specified - PPO | Admitting: Family Medicine

## 2023-03-24 ENCOUNTER — Ambulatory Visit: Payer: Federal, State, Local not specified - PPO | Admitting: Vascular Surgery

## 2023-03-24 ENCOUNTER — Encounter: Payer: Self-pay | Admitting: Vascular Surgery

## 2023-03-24 ENCOUNTER — Ambulatory Visit (HOSPITAL_COMMUNITY)
Admission: RE | Admit: 2023-03-24 | Discharge: 2023-03-24 | Disposition: A | Discharge status: Home / Self Care | Payer: Federal, State, Local not specified - PPO | Source: Ambulatory Visit | Attending: Vascular Surgery | Admitting: Vascular Surgery

## 2023-03-24 VITALS — BP 132/73 | HR 56 | Temp 97.2°F | Resp 18 | Ht 75.0 in | Wt 218.0 lb

## 2023-03-24 DIAGNOSIS — I890 Lymphedema, not elsewhere classified: Secondary | ICD-10-CM

## 2023-03-24 DIAGNOSIS — I872 Venous insufficiency (chronic) (peripheral): Secondary | ICD-10-CM

## 2023-03-24 NOTE — Progress Notes (Signed)
REASON FOR VISIT:   Follow-up of left leg swelling  MEDICAL ISSUES:   LEFT LEG SWELLING: This patient has chronic left lower extremity swelling likely secondary to combined chronic venous insufficiency and lymphedema.  With respect to his venous disease, he has deep venous reflux.  His superficial venous reflux has been previously addressed.  I think he also has a component of lymphedema.  He has had previous prostate surgery for prostate cancer.  The swelling does extend up into the thigh.  I explained that the treatment for both of these is the same.  We have discussed the importance of trying to avoid prolonged sitting and standing.  We discussed the importance of exercise.  I have encouraged him to continue to wear his compression stockings.  We have discussed the importance of daily leg elevation.  If his swelling progresses then I think we could consider a BioTAB lymphedema pump.  However, we both agreed that as the swelling is not too bad at this point we will hold off on that.  He will call if the swelling gets worse.   HPI:   Kristopher Wilson is a pleasant 87 y.o. male who I last saw on 09/17/2022.  He had presented with left leg swelling.  At that time he did not have any evidence of DVT.  He underwent laser ablation of the left great saphenous vein with 10-20 stabs on 07/09/2021.  He had a short segment of reflux in the anterior accessory saphenous vein and a short segment of reflux in the small saphenous vein.  I did not think either of these would contribute significantly to his swelling.  The swelling was not significant and I did not recommend a CT venogram at that time.  We discussed conservative measures and he comes in for 58-month follow-up visit.  Since I saw him last, he does have some persistent left lower extremity swelling.  He does try to elevate his legs some.  He cannot really wear the compression stockings in the summer given the heat.  He does try to exercise and avoid  prolonged sitting and standing.  He did have prostate surgery for prostate cancer around 1998 he believes.  Certainly this could be a reason to have some underlying lymphedema.    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   PROSTATE CANCER, HX OF 07/07/2007   s/p prostatectomy   RLS (restless legs syndrome)    S/P skin biopsy 06/12/2019   Ulcer    ULCERATIVE COLITIS, LEFT SIDED 2000   URTICARIA, ALLERGIC 08/05/2009   VENOUS INSUFFICIENCY 07/28/2010    Family History  Problem Relation Age of Onset   Heart attack Father    Throat cancer Brother    Multiple sclerosis Brother    Multiple sclerosis Son    Diabetes Maternal  Uncle        x 2   Diabetes Maternal Uncle    Colon cancer Neg Hx    Stroke Neg Hx     SOCIAL HISTORY: Social History   Tobacco Use   Smoking status: Former    Current packs/day: 0.00    Types: Cigarettes    Quit date: 09/08/1975    Years since quitting: 47.5   Smokeless tobacco: Never  Substance Use Topics   Alcohol use: Yes    Alcohol/week: 15.0 standard drinks of alcohol    Types: 15 Standard drinks or equivalent per week    Comment: 1-2 "high balls" daily    Allergies  Allergen Reactions   Cephalexin Hives    Current Outpatient Medications  Medication Sig Dispense Refill   amLODipine (NORVASC) 10 MG tablet TAKE 1 TABLET BY MOUTH EVERY DAY 90 tablet 2   atorvastatin  (LIPITOR) 40 MG tablet TAKE 1 TABLET DAILY 90 tablet 2   carvedilol (COREG) 12.5 MG tablet TAKE 1 TABLET DAILY 90 tablet 2   clopidogrel (PLAVIX) 75 MG tablet TAKE 1 TABLET DAILY 90 tablet 1   dapagliflozin propanediol (FARXIGA) 5 MG TABS tablet TAKE 1 TABLET DAILY BEFORE BREAKFAST 90 tablet 1   fenofibrate (TRICOR) 145 MG tablet TAKE 1 TABLET DAILY 90 tablet 2   hydrochlorothiazide (HYDRODIURIL) 25 MG tablet TAKE 1 TABLET DAILY 90 tablet 1   isosorbide mononitrate (IMDUR) 60 MG 24 hr tablet TAKE 1 TABLET BY MOUTH EVERY DAY 90 tablet 1   Mesalamine 800 MG TBEC TAKE 3 TABLETS (2,400MG     TOTAL) TWO TIMES A DAY 180 tablet 11   nitroGLYCERIN (NITROSTAT) 0.4 MG SL tablet Place 1 tablet (0.4 mg total) under the tongue every 5 (five) minutes as needed for chest pain. 25 tablet 6   pantoprazole (PROTONIX) 40 MG tablet TAKE 1 TABLET DAILY 90 tablet 1   pramipexole (MIRAPEX) 1 MG tablet TAKE 1 TABLET AT BEDTIME 90 tablet 1   simethicone (GAS-X) 80 MG chewable tablet Chew 1 tablet (80 mg total) by mouth every 6 (six) hours as needed for flatulence. 30 tablet 0   Current Facility-Administered Medications  Medication Dose Route Frequency Provider Last Rate Last Admin   ipratropium-albuterol (DUONEB) 0.5-2.5 (3) MG/3ML nebulizer solution 3 mL  3 mL Nebulization Once Nafziger, Cory, NP        REVIEW OF SYSTEMS:  [X]  denotes positive finding, [ ]  denotes negative finding Cardiac  Comments:  Chest pain or chest pressure:    Shortness of breath upon exertion:    Short of breath when lying flat:    Irregular heart rhythm:        Vascular    Pain in calf, thigh, or hip brought on by ambulation:    Pain in feet at night that wakes you up from your sleep:     Blood clot in your veins:    Leg swelling:  x       Pulmonary    Oxygen at home:    Productive cough:     Wheezing:         Neurologic    Sudden weakness in arms or legs:     Sudden numbness in arms or legs:     Sudden onset of difficulty  speaking or slurred speech:    Temporary loss of vision in one eye:     Problems with dizziness:         Gastrointestinal    Blood in stool:  Vomited blood:         Genitourinary    Burning when urinating:     Blood in urine:        Psychiatric    Major depression:         Hematologic    Bleeding problems:    Problems with blood clotting too easily:        Skin    Rashes or ulcers:        Constitutional    Fever or chills:     PHYSICAL EXAM:   Vitals:   03/24/23 1256  BP: 132/73  Pulse: (!) 56  Resp: 18  Temp: (!) 97.2 F (36.2 C)  TempSrc: Temporal  SpO2: 98%  Weight: 218 lb (98.9 kg)  Height: 6\' 3"  (1.905 m)    GENERAL: The patient is a well-nourished male, in no acute distress. The vital signs are documented above. CARDIAC: There is a regular rate and rhythm.  VASCULAR: I do not detect carotid bruits I cannot palpate pedal pulses however he has brisk signals in the dorsalis pedis and posterior tibial positions bilaterally. He he has hyperpigmentation bilaterally consistent with chronic venous insufficiency. He has some nonpitting edema on the left consistent with lymphedema. PULMONARY: There is good air exchange bilaterally without wheezing or rales. ABDOMEN: Soft and non-tender with normal pitched bowel sounds.  MUSCULOSKELETAL: There are no major deformities or cyanosis. NEUROLOGIC: No focal weakness or paresthesias are detected. SKIN: There are no ulcers or rashes noted. PSYCHIATRIC: The patient has a normal affect.  DATA:    VENOUS DUPLEX: I have independently interpreted his venous duplex scan today of the IVC and left common iliac vein.  There is no evidence of thrombus in the IVC or left common iliac vein.  Waverly Ferrari [facility] [phone]

## 2023-04-29 ENCOUNTER — Other Ambulatory Visit: Payer: Self-pay | Admitting: Family Medicine

## 2023-04-29 DIAGNOSIS — K219 Gastro-esophageal reflux disease without esophagitis: Secondary | ICD-10-CM

## 2023-05-13 ENCOUNTER — Other Ambulatory Visit: Payer: Self-pay | Admitting: Cardiology

## 2023-06-09 ENCOUNTER — Ambulatory Visit: Payer: Federal, State, Local not specified - PPO | Admitting: Cardiology

## 2023-07-27 LAB — HM DIABETES EYE EXAM

## 2023-07-29 ENCOUNTER — Encounter: Payer: Self-pay | Admitting: Family Medicine

## 2023-09-09 ENCOUNTER — Ambulatory Visit
Admission: RE | Admit: 2023-09-09 | Discharge: 2023-09-09 | Disposition: A | Discharge status: Home / Self Care | Payer: Federal, State, Local not specified - PPO | Source: Ambulatory Visit | Attending: Family Medicine | Admitting: Family Medicine

## 2023-09-09 DIAGNOSIS — E041 Nontoxic single thyroid nodule: Secondary | ICD-10-CM

## 2023-09-13 ENCOUNTER — Encounter: Payer: Self-pay | Admitting: Family Medicine

## 2023-09-13 DIAGNOSIS — E041 Nontoxic single thyroid nodule: Secondary | ICD-10-CM

## 2023-09-16 ENCOUNTER — Ambulatory Visit: Payer: Federal, State, Local not specified - PPO | Admitting: Family Medicine

## 2023-09-17 ENCOUNTER — Telehealth: Payer: Self-pay | Admitting: Family Medicine

## 2023-09-17 NOTE — Telephone Encounter (Signed)
 error

## 2023-09-25 ENCOUNTER — Other Ambulatory Visit: Payer: Self-pay | Admitting: Cardiology

## 2023-10-11 ENCOUNTER — Other Ambulatory Visit: Payer: Self-pay | Admitting: Cardiology

## 2023-10-11 ENCOUNTER — Other Ambulatory Visit: Payer: Self-pay | Admitting: Family Medicine

## 2023-10-11 DIAGNOSIS — G2581 Restless legs syndrome: Secondary | ICD-10-CM

## 2023-10-11 DIAGNOSIS — K219 Gastro-esophageal reflux disease without esophagitis: Secondary | ICD-10-CM

## 2023-10-20 ENCOUNTER — Other Ambulatory Visit: Payer: Self-pay | Admitting: Family Medicine

## 2023-10-20 DIAGNOSIS — N1831 Chronic kidney disease, stage 3a: Secondary | ICD-10-CM

## 2023-10-20 MED ORDER — DAPAGLIFLOZIN PROPANEDIOL 5 MG PO TABS: 5.0000 mg | ORAL_TABLET | Freq: Every day | ORAL | 1 refills | Status: DC

## 2023-10-20 NOTE — Telephone Encounter (Signed)
Copied from CRM [id number]. Topic: Clinical - Medication Refill >> Oct 20, 2023  8:12 AM Desma Mcgregor wrote: Most Recent Primary Care Visit:  Provider: Mliss Sax  Department: [facility]  Visit Type: OFFICE VISIT  Date: 03/12/2023  Medication: dapagliflozin propanediol (FARXIGA) 5 MG TABS tablet  Has the patient contacted their pharmacy? Yes (Agent: If no, request that the patient contact the pharmacy for the refill. If patient does not wish to contact the pharmacy document the reason why and proceed with request.) (Agent: If yes, when and what did the pharmacy advise?)  Is this the correct pharmacy for this prescription? Yes If no, delete pharmacy and type the correct one.  This is the patient's preferred pharmacy:  [facility] - [place], [place] - [address] Portal to Registered [employer] [address] [place] [address] Phone: [phone] Fax: [phone]  Has the prescription been filled recently? No  Is the patient out of the medication? No, but has one left  Has the patient been seen for an appointment in the last year OR does the patient have an upcoming appointment? Yes  Can we respond through MyChart? Yes  Agent: Please be advised that Rx refills may take up to 3 business days. We ask that you follow-up with your pharmacy.

## 2023-10-25 ENCOUNTER — Ambulatory Visit
Admission: RE | Admit: 2023-10-25 | Discharge: 2023-10-25 | Disposition: A | Discharge status: Home / Self Care | Payer: Federal, State, Local not specified - PPO | Source: Ambulatory Visit | Attending: Family Medicine | Admitting: Family Medicine

## 2023-10-25 ENCOUNTER — Other Ambulatory Visit (HOSPITAL_COMMUNITY)
Admission: RE | Admit: 2023-10-25 | Discharge: 2023-10-25 | Disposition: A | Discharge status: Home / Self Care | Payer: Federal, State, Local not specified - PPO | Source: Ambulatory Visit | Attending: Family Medicine | Admitting: Family Medicine

## 2023-10-25 DIAGNOSIS — E041 Nontoxic single thyroid nodule: Secondary | ICD-10-CM | POA: Insufficient documentation

## 2023-10-27 LAB — CYTOLOGY - NON PAP

## 2023-10-28 NOTE — Addendum Note (Signed)
Addended by: Andrez Grime on: 10/28/2023 09:34 AM   Modules accepted: Orders

## 2023-11-02 ENCOUNTER — Telehealth: Payer: Self-pay

## 2023-11-02 NOTE — Telephone Encounter (Signed)
 Pharmacy Patient Advocate Encounter  Received notification from [facility] that Prior Authorization for Dapagliflozin Propanediol 5MG  tablets has been APPROVED from 10/03/2023 to 11/01/2024   PA #/Case ID/Reference #:  [id number]

## 2023-11-02 NOTE — Telephone Encounter (Signed)
 Pharmacy Patient Advocate Encounter   Received notification from Onbase that prior authorization for Dapagliflozin Propanediol 5MG  tablets is required/requested.   Insurance verification completed.   The patient is insured through [facility] .   Per test claim: PA required; PA submitted to above mentioned insurance via CoverMyMeds Key/confirmation #/EOC [id number] Status is pending

## 2023-11-03 ENCOUNTER — Telehealth: Payer: Self-pay | Admitting: Cardiology

## 2023-11-03 NOTE — Telephone Encounter (Signed)
   Name: Kristopher Wilson  DOB: Jul 24, 1936  MRN: [id number]  Primary Cardiologist: Donato Schultz, MD   Preoperative team, please contact this patient and set up a phone call appointment for further preoperative risk assessment. Please obtain consent and complete medication review. Thank you for your help.  I confirm that guidance regarding antiplatelet and oral anticoagulation therapy has been completed and, if necessary, noted below.  Per office protocol, patient may hold Plavix for 5 days prior to procedure.  Patient should take Aspirin 81 mg throughout the perioperative period. Please resume Plavix as soon as possible postprocedure, at the discretion of the surgeon. He may discontinue ASA at that time.    I also confirmed the patient resides in the state of [place]. As per [facility] Medical Board telemedicine laws, the patient must reside in the state in which the provider is licensed.   Joylene Grapes, NP 11/03/2023, 11:15 AM [facility]

## 2023-11-03 NOTE — Telephone Encounter (Signed)
   Pre-operative Risk Assessment    Patient Name: Kristopher Wilson  DOB: [birth year] MRN: [id number]   Date of last office visit: 03/09/2023 Date of next office visit: none   Request for Surgical Clearance    Procedure:   thyroid lobectomy  Date of Surgery:  Clearance TBD                                Surgeon:  Dr. Darnell Level Surgeon's Group or Practice Name:  [facility] Phone number:  [phone] Fax number:  [phone]   Type of Clearance Requested:   - Medical  - Pharmacy:  Hold Clopidogrel (Plavix) need instruction   Type of Anesthesia:  Not Indicated   Additional requests/questions:    Sharen Hones   11/03/2023, 9:55 AM

## 2023-11-04 ENCOUNTER — Telehealth: Payer: Self-pay | Admitting: *Deleted

## 2023-11-04 NOTE — Telephone Encounter (Signed)
 Pt has been scheduled for a tele visit, 11/15/23 9:00.  Consent on file / medications reconciled.    Patient Consent for Virtual Visit        Kristopher Wilson has provided verbal consent on 11/04/2023 for a virtual visit (video or telephone).   CONSENT FOR VIRTUAL VISIT FOR:  Kristopher Wilson  By participating in this virtual visit I agree to the following:  I hereby voluntarily request, consent and authorize [facility] and its employed or contracted physicians, physician assistants, nurse practitioners or other licensed health care professionals (the Practitioner), to provide me with telemedicine health care services (the "Services") as deemed necessary by the treating Practitioner. I acknowledge and consent to receive the Services by the Practitioner via telemedicine. I understand that the telemedicine visit will involve communicating with the Practitioner through live audiovisual communication technology and the disclosure of certain medical information by electronic transmission. I acknowledge that I have been given the opportunity to request an in-person assessment or other available alternative prior to the telemedicine visit and am voluntarily participating in the telemedicine visit.  I understand that I have the right to withhold or withdraw my consent to the use of telemedicine in the course of my care at any time, without affecting my right to future care or treatment, and that the Practitioner or I may terminate the telemedicine visit at any time. I understand that I have the right to inspect all information obtained and/or recorded in the course of the telemedicine visit and may receive copies of available information for a reasonable fee.  I understand that some of the potential risks of receiving the Services via telemedicine include:  Delay or interruption in medical evaluation due to technological equipment failure or disruption; Information transmitted may not be sufficient  (e.g. poor resolution of images) to allow for appropriate medical decision making by the Practitioner; and/or  In rare instances, security protocols could fail, causing a breach of personal health information.  Furthermore, I acknowledge that it is my responsibility to provide information about my medical history, conditions and care that is complete and accurate to the best of my ability. I acknowledge that Practitioner's advice, recommendations, and/or decision may be based on factors not within their control, such as incomplete or inaccurate data provided by me or distortions of diagnostic images or specimens that may result from electronic transmissions. I understand that the practice of medicine is not an exact science and that Practitioner makes no warranties or guarantees regarding treatment outcomes. I acknowledge that a copy of this consent can be made available to me via my patient portal [facility] MyChart), or I can request a printed copy by calling the office of [facility].    I understand that my insurance will be billed for this visit.   I have read or had this consent read to me. I understand the contents of this consent, which adequately explains the benefits and risks of the Services being provided via telemedicine.  I have been provided ample opportunity to ask questions regarding this consent and the Services and have had my questions answered to my satisfaction. I give my informed consent for the services to be provided through the use of telemedicine in my medical care

## 2023-11-04 NOTE — Telephone Encounter (Signed)
 Pt has been scheduled for a tele visit, 11/15/23 9:00.  Consent on file / medications reconciled

## 2023-11-04 NOTE — Telephone Encounter (Signed)
 Pt returning call

## 2023-11-04 NOTE — Telephone Encounter (Signed)
1st attempt to reach pt regarding surgical clearance and the need for a tele visit.  Left a message for pt to call back and ask for the preop team. 

## 2023-11-13 ENCOUNTER — Other Ambulatory Visit: Payer: Self-pay | Admitting: Cardiology

## 2023-11-13 DIAGNOSIS — E7800 Pure hypercholesterolemia, unspecified: Secondary | ICD-10-CM

## 2023-11-13 DIAGNOSIS — I2510 Atherosclerotic heart disease of native coronary artery without angina pectoris: Secondary | ICD-10-CM

## 2023-11-13 DIAGNOSIS — I10 Essential (primary) hypertension: Secondary | ICD-10-CM

## 2023-11-15 ENCOUNTER — Ambulatory Visit: Payer: Federal, State, Local not specified - PPO | Attending: Physician Assistant | Admitting: Physician Assistant

## 2023-11-15 DIAGNOSIS — I2510 Atherosclerotic heart disease of native coronary artery without angina pectoris: Secondary | ICD-10-CM

## 2023-11-15 NOTE — Progress Notes (Signed)
 Virtual Visit via Telephone Note   Because of GIANLUCA CHHIM co-morbid illnesses, he is at least at moderate risk for complications without adequate follow up.  This format is felt to be most appropriate for this patient at this time.  Due to technical limitations with video connection (technology), today's appointment will be conducted as an audio only telehealth visit, and LAMARCO GUDIEL verbally agreed to proceed in this manner.   All issues noted in this document were discussed and addressed.  No physical exam could be performed with this format.  Evaluation Performed:  Preoperative cardiovascular risk assessment _____________   Date:  11/15/2023   Patient ID:  Edythe Lynn, DOB Jul 02, 1936, MRN [id number] Patient Location:  Home Provider location:   Office  Primary Care Provider:  Mliss Sax, MD Primary Cardiologist:  Donato Schultz, MD  Chief Complaint / Patient Profile   88 y.o. y/o male with a h/o  Coronary artery disease S/p BMS to LAD in 2000 S/p DES to PLV in 2009 Myoview 05/29/2021: Low risk (HFpEF) heart failure with preserved ejection fraction  Hypertension Hyperlipidemia Carotid artery disease [place] 12/03/2020: Bilateral ICA 1-39 Chronic kidney disease Venous insufficiency  who is pending thyroid lobectomy with Dr. Gerrit Friends (type of anesthesia unknown) and presents today for telephonic preoperative cardiovascular risk assessment. Request is to hold Clopidogrel.     History of Present Illness    AUSTYN SEIER is a 88 y.o. male who presents via audio/video conferencing for a telehealth visit today.  Pt was last seen in cardiology clinic on 03/09/23 by Dr. Anne Fu.  At that time GEOFFREY HYNES was doing well.  The patient is now pending procedure as outlined above. Since his last visit, he has done well without chest pain or shortness of breath.  He remains active.    Physical Exam    Vital Signs:  KEALII THUESON does not have vital signs available for  review today.  Given telephonic nature of communication, physical exam is limited. AAOx3. NAD. Normal affect.  Speech and respirations are unlabored.  Accessory Clinical Findings    None    Assessment & Plan    1.  Preoperative Cardiovascular Risk Assessment: Mr. Biel perioperative risk of a major cardiac event is high at 6.6% according to the Revised Cardiac Risk Index (RCRI).  However, His functional capacity is good at 4.31 METs according to the [facility] Activity Status Index (DASI). Recommendations: Therefore, According to ACC/AHA guidelines, no further cardiovascular testing needed.  The patient may proceed to surgery at acceptable risk.   Antiplatelet and/or Anticoagulation Recommendations: -Clopidogrel (Plavix) can be held for 5 days prior to his surgery and resumed as soon as possible post op. -Ideally, we prefer the patient start aspirin 81 mg daily once he is off of clopidogrel and continue this throughout the perioperative period.  Postoperatively, the patient can stop aspirin and resume clopidogrel, when felt to be safe.   -If it is felt that the risk of bleeding is too great to take aspirin in the perioperative period, the patient can forego taking aspirin.   The patient was advised that if he develops new symptoms prior to surgery to contact our office to arrange for a follow-up visit, and he verbalized understanding.  A copy of this note will be routed to requesting surgeon.  Time:   Today, I have spent 6 minutes with the patient with telehealth technology discussing medical history, symptoms, and management plan.     Tereso Newcomer,  PA-C 11/15/2023, 7:11 AM

## 2023-11-15 NOTE — Telephone Encounter (Signed)
 Notes faxed to surgeon Tereso Newcomer, PA-C    11/15/2023 9:22 AM

## 2023-12-07 ENCOUNTER — Ambulatory Visit: Payer: Federal, State, Local not specified - PPO | Admitting: Family Medicine

## 2023-12-13 ENCOUNTER — Encounter: Payer: Self-pay | Admitting: Family Medicine

## 2023-12-13 ENCOUNTER — Ambulatory Visit: Payer: Federal, State, Local not specified - PPO | Admitting: Family Medicine

## 2023-12-13 VITALS — BP 120/76 | HR 70 | Temp 97.5°F | Ht 75.0 in | Wt 224.2 lb

## 2023-12-13 DIAGNOSIS — S161XXA Strain of muscle, fascia and tendon at neck level, initial encounter: Secondary | ICD-10-CM

## 2023-12-13 DIAGNOSIS — R7303 Prediabetes: Secondary | ICD-10-CM

## 2023-12-13 DIAGNOSIS — N1831 Chronic kidney disease, stage 3a: Secondary | ICD-10-CM

## 2023-12-13 DIAGNOSIS — E7800 Pure hypercholesterolemia, unspecified: Secondary | ICD-10-CM | POA: Diagnosis not present

## 2023-12-13 DIAGNOSIS — I2510 Atherosclerotic heart disease of native coronary artery without angina pectoris: Secondary | ICD-10-CM

## 2023-12-13 DIAGNOSIS — I509 Heart failure, unspecified: Secondary | ICD-10-CM

## 2023-12-13 LAB — COMPREHENSIVE METABOLIC PANEL WITH GFR
ALT: 21 U/L (ref 0–53)
AST: 18 U/L (ref 0–37)
Albumin: 4.4 g/dL (ref 3.5–5.2)
Alkaline Phosphatase: 77 U/L (ref 39–117)
BUN: 19 mg/dL (ref 6–23)
CO2: 26 meq/L (ref 19–32)
Calcium: 9.1 mg/dL (ref 8.4–10.5)
Chloride: 104 meq/L (ref 96–112)
Creatinine, Ser: 1.21 mg/dL (ref 0.40–1.50)
GFR: 54.58 mL/min — ABNORMAL LOW (ref >60.00)
Glucose, Bld: 112 mg/dL — ABNORMAL HIGH (ref 70–99)
Potassium: 4 meq/L (ref 3.5–5.1)
Sodium: 138 meq/L (ref 135–145)
Total Bilirubin: 0.8 mg/dL (ref 0.2–1.2)
Total Protein: 6.3 g/dL (ref 6.0–8.3)

## 2023-12-13 LAB — HEMOGLOBIN A1C: Hgb A1c MFr Bld: 6.6 % — ABNORMAL HIGH (ref 4.6–6.5)

## 2023-12-13 LAB — LDL CHOLESTEROL, DIRECT: Direct LDL: 93 mg/dL

## 2023-12-13 MED ORDER — NITROGLYCERIN 0.4 MG SL SUBL: 0.4000 mg | SUBLINGUAL_TABLET | SUBLINGUAL | 6 refills | Status: AC | PRN

## 2023-12-13 MED ORDER — METHOCARBAMOL 500 MG PO TABS: 500.0000 mg | ORAL_TABLET | Freq: Three times a day (TID) | ORAL | 0 refills | Status: DC | PRN

## 2023-12-13 NOTE — Progress Notes (Signed)
 Established Patient Office Visit   Subjective:  Patient ID: Kristopher Wilson, male    DOB: 18-Apr-1936  Age: 88 y.o. MRN: [id number]  Chief Complaint  Patient presents with   Medical Management of Chronic Issues    6 month follow up. Pt is fasting.    Torticollis    Pt complains of neck stiffness and soreness x 6-8 weeks. Hurt when rotating to right.     HPI Encounter Diagnoses  Name Primary?   Stage 3a chronic kidney disease (HCC) Yes   Pre-diabetes    Elevated LDL cholesterol level    Strain of neck muscle, initial encounter    Coronary artery disease involving native coronary artery of native heart without angina pectoris    For follow-up of above.  Continues on medications as above.  Has been active physically by walking and playing golf.  Denies chest pain shortness of breath or difficulty breathing.  2-week history of stiffness in his neck.  There has been no injury.  He has been trying a topical salve.  Has neck surgery scheduled next month for PTC.   Review of Systems  Constitutional: Negative.   HENT: Negative.    Eyes:  Negative for blurred vision, discharge and redness.  Respiratory: Negative.  Negative for shortness of breath.   Cardiovascular: Negative.  Negative for chest pain.  Gastrointestinal:  Negative for abdominal pain.  Genitourinary: Negative.   Musculoskeletal:  Positive for neck pain. Negative for myalgias.  Skin:  Negative for rash.  Neurological:  Negative for tingling, loss of consciousness and weakness.  Endo/Heme/Allergies:  Negative for polydipsia.     Current Outpatient Medications:    amLODipine (NORVASC) 10 MG tablet, TAKE 1 TABLET BY MOUTH EVERY DAY, Disp: 90 tablet, Rfl: 3   atorvastatin (LIPITOR) 40 MG tablet, TAKE 1 TABLET DAILY, Disp: 90 tablet, Rfl: 1   carvedilol (COREG) 12.5 MG tablet, TAKE 1 TABLET DAILY, Disp: 90 tablet, Rfl: 2   clopidogrel (PLAVIX) 75 MG tablet, TAKE 1 TABLET DAILY, Disp: 90 tablet, Rfl: 1   dapagliflozin  propanediol (FARXIGA) 5 MG TABS tablet, Take 1 tablet (5 mg total) by mouth daily before breakfast., Disp: 90 tablet, Rfl: 1   fenofibrate (TRICOR) 145 MG tablet, TAKE 1 TABLET DAILY, Disp: 90 tablet, Rfl: 0   hydrochlorothiazide (HYDRODIURIL) 25 MG tablet, TAKE 1 TABLET DAILY, Disp: 90 tablet, Rfl: 0   isosorbide mononitrate (IMDUR) 60 MG 24 hr tablet, TAKE 1 TABLET BY MOUTH EVERY DAY, Disp: 90 tablet, Rfl: 1   methocarbamol (ROBAXIN) 500 MG tablet, Take 1 tablet (500 mg total) by mouth every 8 (eight) hours as needed for muscle spasms., Disp: 30 tablet, Rfl: 0   pantoprazole (PROTONIX) 40 MG tablet, TAKE 1 TABLET DAILY, Disp: 90 tablet, Rfl: 1   pramipexole (MIRAPEX) 1 MG tablet, TAKE 1 TABLET AT BEDTIME, Disp: 90 tablet, Rfl: 1   simethicone (GAS-X) 80 MG chewable tablet, Chew 1 tablet (80 mg total) by mouth every 6 (six) hours as needed for flatulence., Disp: 30 tablet, Rfl: 0   nitroGLYCERIN (NITROSTAT) 0.4 MG SL tablet, Place 1 tablet (0.4 mg total) under the tongue every 5 (five) minutes as needed for chest pain., Disp: 25 tablet, Rfl: 6  Current Facility-Administered Medications:    ipratropium-albuterol (DUONEB) 0.5-2.5 (3) MG/3ML nebulizer solution 3 mL, 3 mL, Nebulization, Once, Nafziger, Cory, NP   Objective:     BP 120/76 (BP Location: Left Arm, Patient Position: Sitting, Cuff Size: Normal)   Pulse 70  Temp (!) 97.5 F (36.4 C) (Temporal)   Ht 6\' 3"  (1.905 m)   Wt 224 lb 3.2 oz (101.7 kg)   SpO2 98%   BMI 28.02 kg/m    Physical Exam Constitutional:      General: He is not in acute distress.    Appearance: Normal appearance. He is not ill-appearing, toxic-appearing or diaphoretic.  HENT:     Head: Normocephalic and atraumatic.     Right Ear: External ear normal.     Left Ear: External ear normal.     Mouth/Throat:     Mouth: Mucous membranes are moist.     Pharynx: Oropharynx is clear. No oropharyngeal exudate or posterior oropharyngeal erythema.  Eyes:      General: No scleral icterus.       Right eye: No discharge.        Left eye: No discharge.     Extraocular Movements: Extraocular movements intact.     Conjunctiva/sclera: Conjunctivae normal.     Pupils: Pupils are equal, round, and reactive to light.  Cardiovascular:     Rate and Rhythm: Normal rate and regular rhythm.  Abdominal:     General: Bowel sounds are normal.     Tenderness: There is no abdominal tenderness. There is no guarding.  Musculoskeletal:     Cervical back: No rigidity, tenderness or bony tenderness. No pain with movement. Decreased range of motion.       Back:  Skin:    General: Skin is warm and dry.  Neurological:     Mental Status: He is alert and oriented to person, place, and time.  Psychiatric:        Mood and Affect: Mood normal.        Behavior: Behavior normal.      No results found for any visits on 12/13/23.    The ASCVD Risk score (Arnett DK, et al., 2019) failed to calculate for the following reasons:   The 2019 ASCVD risk score is only valid for ages 11 to 39    Assessment & Plan:   Stage 3a chronic kidney disease (HCC) -     Comprehensive metabolic panel with GFR  Pre-diabetes -     Hemoglobin A1c -     Comprehensive metabolic panel with GFR  Elevated LDL cholesterol level -     Comprehensive metabolic panel with GFR -     LDL cholesterol, direct  Strain of neck muscle, initial encounter -     Ambulatory referral to Sports Medicine -     DG Cervical Spine Complete; Future  Coronary artery disease involving native coronary artery of native heart without angina pectoris -     Nitroglycerin; Place 1 tablet (0.4 mg total) under the tongue every 5 (five) minutes as needed for chest pain.  Dispense: 25 tablet; Refill: 6  Other orders -     Methocarbamol; Take 1 tablet (500 mg total) by mouth every 8 (eight) hours as needed for muscle spasms.  Dispense: 30 tablet; Refill: 0    Return in about 6 months (around 06/13/2024), or if  symptoms worsen or fail to improve.    Mliss Sax, MD

## 2023-12-14 DIAGNOSIS — S161XXA Strain of muscle, fascia and tendon at neck level, initial encounter: Secondary | ICD-10-CM | POA: Insufficient documentation

## 2023-12-14 DIAGNOSIS — I509 Heart failure, unspecified: Secondary | ICD-10-CM | POA: Insufficient documentation

## 2023-12-14 MED ORDER — DAPAGLIFLOZIN PROPANEDIOL 10 MG PO TABS: 10.0000 mg | ORAL_TABLET | Freq: Every day | ORAL | 1 refills | Status: DC

## 2023-12-14 NOTE — Addendum Note (Signed)
 Addended by: Andrez Grime on: 12/14/2023 07:19 AM   Modules accepted: Orders

## 2023-12-16 ENCOUNTER — Ambulatory Visit: Admitting: Sports Medicine

## 2023-12-16 ENCOUNTER — Ambulatory Visit (INDEPENDENT_AMBULATORY_CARE_PROVIDER_SITE_OTHER)

## 2023-12-16 VITALS — BP 120/76 | HR 69 | Ht 75.0 in | Wt 224.0 lb

## 2023-12-16 DIAGNOSIS — M5030 Other cervical disc degeneration, unspecified cervical region: Secondary | ICD-10-CM

## 2023-12-16 DIAGNOSIS — M542 Cervicalgia: Secondary | ICD-10-CM

## 2023-12-16 DIAGNOSIS — S161XXA Strain of muscle, fascia and tendon at neck level, initial encounter: Secondary | ICD-10-CM | POA: Diagnosis not present

## 2023-12-16 NOTE — Progress Notes (Signed)
 Kristopher Wilson D.[facility] [address] [place] [address] Phone: [phone]   Assessment and Plan:     1. Neck pain 2. DDD (degenerative disc disease), cervical  -Chronic with exacerbation, initial sports medicine visit - History of intermittent neck pain with flare of neck pain over the past 8 weeks without specific MOI.  Most consistent with flare of underlying cervical DDD and degenerative changes of spine as seen on x-ray at today's visit - X-ray obtained at today's visit.  My interpretation: No acute fracture or vertebral collapse.  Degenerative changes including anterior cortical changes, decreased disc space, facet arthropathy throughout cervical spine.  Loss of typical lordosis - Start HEP.  Offered physical therapy which patient declined at today's visit.  Could consider PT at future visit - Start Tylenol 500 to 1000 mg tablets 2-3 times a day for day-to-day pain relief - May continue Robaxin 500 mg every 8 hours as needed for muscle spasms - Do not recommend NSAIDs or p.o. steroid courses at this time due to comorbidities including CKD 3A, ulcerative colitis, hypertension, prediabetes, CAD, antiplatelet therapy with Plavix and thyroid surgery scheduled for next month - Continue heating pad and massage gun as tolerated  Pertinent previous records reviewed include none  Follow Up: 4 weeks for reevaluation.  If no improvement or worsening of symptoms, could consider  prednisone course after surgery, versus physical therapy   Subjective:   I, Kristopher Wilson, am serving as a Neurosurgeon for Kristopher Wilson  Chief Complaint: neck pain   HPI:   12/16/23 Patient is a 88 year old male with neck pain. Patient states decreased ROM  due to pain. Pain started 6-8 weeks ago.no MOI. No radiating pain but pain is causing headaches. Robaxin helps a little. Will have thyroid surgery next month.   Relevant Historical Information: Prediabetes,  history of prostate cancer, CKD stage IIIa, thyromegaly, ulcerative colitis  Additional pertinent review of systems negative.   Current Outpatient Medications:    amLODipine (NORVASC) 10 MG tablet, TAKE 1 TABLET BY MOUTH EVERY DAY, Disp: 90 tablet, Rfl: 3   atorvastatin (LIPITOR) 40 MG tablet, TAKE 1 TABLET DAILY, Disp: 90 tablet, Rfl: 1   carvedilol (COREG) 12.5 MG tablet, TAKE 1 TABLET DAILY, Disp: 90 tablet, Rfl: 2   clopidogrel (PLAVIX) 75 MG tablet, TAKE 1 TABLET DAILY, Disp: 90 tablet, Rfl: 1   dapagliflozin propanediol (FARXIGA) 10 MG TABS tablet, Take 1 tablet (10 mg total) by mouth daily before breakfast., Disp: 90 tablet, Rfl: 1   fenofibrate (TRICOR) 145 MG tablet, TAKE 1 TABLET DAILY, Disp: 90 tablet, Rfl: 0   hydrochlorothiazide (HYDRODIURIL) 25 MG tablet, TAKE 1 TABLET DAILY, Disp: 90 tablet, Rfl: 0   isosorbide mononitrate (IMDUR) 60 MG 24 hr tablet, TAKE 1 TABLET BY MOUTH EVERY DAY, Disp: 90 tablet, Rfl: 1   methocarbamol (ROBAXIN) 500 MG tablet, Take 1 tablet (500 mg total) by mouth every 8 (eight) hours as needed for muscle spasms., Disp: 30 tablet, Rfl: 0   nitroGLYCERIN (NITROSTAT) 0.4 MG SL tablet, Place 1 tablet (0.4 mg total) under the tongue every 5 (five) minutes as needed for chest pain., Disp: 25 tablet, Rfl: 6   pantoprazole (PROTONIX) 40 MG tablet, TAKE 1 TABLET DAILY, Disp: 90 tablet, Rfl: 1   pramipexole (MIRAPEX) 1 MG tablet, TAKE 1 TABLET AT BEDTIME, Disp: 90 tablet, Rfl: 1   simethicone (GAS-X) 80 MG chewable tablet, Chew 1 tablet (80 mg total) by mouth every  6 (six) hours as needed for flatulence., Disp: 30 tablet, Rfl: 0  Current Facility-Administered Medications:    ipratropium-albuterol (DUONEB) 0.5-2.5 (3) MG/3ML nebulizer solution 3 mL, 3 mL, Nebulization, Once, Nafziger, Cory, NP   Objective:     Vitals:   12/16/23 1104  BP: 120/76  Pulse: 69  SpO2: 95%  Weight: 224 lb (101.6 kg)  Height: 6\' 3"  (1.905 m)      Body mass index is 28 kg/m.     Physical Exam:    Neck Exam: Cervical Spine- Posture normal Skin- normal, intact  Neuro:  Strength-  Right Left   Deltoid (C5) 5/5 5/5  Bicep/Brachioradialis (C5/6) 5/5  5/5  Wrist Extension (C6) 5/5 5/5  Tricep (C7) 5/5 5/5  Wrist Flexion (C7) 5/5 5/5  Grip (C8) 5/5 5/5  Finger Abduction (T1) 5/5 5/5   Sensation: intact to light touch in upper extremities bilaterally  Spurling's:  negative bilaterally Neck ROM: Decreased extension, bilateral rotation, bilateral sidebending.  More restricted on right compared to left  TTP: cervical spinous processes, cervical paraspinal, thoracic paraspinal, trapezius    Electronically signed by:  Kristopher Wilson D.[facility] 11:34 AM 12/16/23

## 2023-12-16 NOTE — Patient Instructions (Signed)
 Tylenol [phone] mg 2-3 times a day for pain relief  Continue robaxin  Neck HEP Continue heating pad and massage gun  4-6 week follow up after surgery

## 2024-01-03 NOTE — Progress Notes (Signed)
Sent message, via epic in basket, requesting order in epic from surgeon  

## 2024-01-04 ENCOUNTER — Ambulatory Visit: Payer: Self-pay | Admitting: Surgery

## 2024-01-04 DIAGNOSIS — C73 Malignant neoplasm of thyroid gland: Secondary | ICD-10-CM | POA: Insufficient documentation

## 2024-01-04 DIAGNOSIS — E041 Nontoxic single thyroid nodule: Secondary | ICD-10-CM | POA: Insufficient documentation

## 2024-01-04 NOTE — H&P (Signed)
 REFERRING PHYSICIAN: Delene Feinstein, MD  PROVIDER: Preeya Cleckley [place] Kocher, MD   Chief Complaint: New Consultation (Papillary thyroid  carcinoma)  History of Present Illness:  Patient is referred by Dr. Christianna Cowman for surgical evaluation and and management of newly diagnosed papillary thyroid  carcinoma. Patient had been noted on examination about 1 year ago to have a palpable abnormality in the neck. He also has a family history of throat cancer in his brother over 40 years ago. Patient underwent a follow-up ultrasound in January 2025. This shows a normal-sized thyroid  gland with a 9 mm solitary nodule in the left thyroid  lobe. There had been interval development of echogenic foci and fine-needle aspiration biopsy was recommended. Biopsy was performed on October 25, 2023. Cytopathology shows findings consistent with papillary carcinoma. Recent TSH level is normal at 2.41. Patient has never been on thyroid  medication. He has had no prior head or neck surgery. He has had some recent neck discomfort bilaterally. This sounds as if it is muscular located posteriorly in the neck. It may be related to changing pillows at home. Patient presents today to discuss thyroid  surgery for management of papillary thyroid  carcinoma.  Does have a history of coronary artery disease. He has had cardiac stents placed approximately 20 years ago. He continues on Plavix . He is followed by Dr. Dorothye Gathers.  Review of Systems: A complete review of systems was obtained from the patient. I have reviewed this information and discussed as appropriate with the patient. See HPI as well for other ROS.  Review of Systems  Constitutional: Negative.  HENT: Negative.  Eyes: Negative.  Respiratory: Negative.  Cardiovascular: Negative.  Gastrointestinal: Negative.  Genitourinary: Negative.  Musculoskeletal: Positive for neck pain.  Skin: Negative.  Neurological: Negative.  Endo/Heme/Allergies: Negative.   Psychiatric/Behavioral: Negative.    Medical History: Past Medical History:  Diagnosis Date  Arthritis  History of cancer  Thyroid  disease   Patient Active Problem List  Diagnosis  Papillary thyroid  carcinoma (CMS/HHS-HCC)  Thyroid  nodule   Past Surgical History:  Procedure Laterality Date  PROSTATE SURGERY  Shoulder surgery    No Known Allergies  Current Outpatient Medications on File Prior to Visit  Medication Sig Dispense Refill  amLODIPine  (NORVASC ) 10 MG tablet Take 1 tablet by mouth once daily  amLODIPine -atorvastatin  (CADUET) 10-10 mg tablet  atorvastatin  (LIPITOR) 40 MG tablet  carvediloL  (COREG ) 12.5 MG tablet  clopidogreL  (PLAVIX ) 75 mg tablet  FARXIGA  5 mg tablet  fenofibrate  150 mg Cap  hydroCHLOROthiazide  (HYDRODIURIL ) 25 MG tablet  isosorbide  mononitrate (IMDUR ) 30 MG ER tablet  mesalamine  (ASACOL  HD) 800 mg EC tablet TAKE 3 TABLETS (2,400MG  TOTAL) TWO TIMES A DAY  pantoprazole  (PROTONIX ) 40 MG DR tablet Take 1 tablet by mouth once daily  pramipexole  (MIRAPEX ) 1 MG tablet Take 1 tablet by mouth at bedtime  prednisoLONE acetate (PRED FORTE) 1 % ophthalmic suspension INSTILL 1 DROP INTO AFFECTED EYE(S) 4 TIMES A DAY FOR 7 DAYS  simethicone  (MYLICON) 80 MG chewable tablet Take 80 mg by mouth   No current facility-administered medications on file prior to visit.   Family History  Problem Relation Age of Onset  Coronary Artery Disease (Blocked arteries around heart) Father    Social History   Tobacco Use  Smoking Status Former  Types: Cigarettes  Start date: 1976  Smokeless Tobacco Never    Social History   Socioeconomic History  Marital status: Married  Tobacco Use  Smoking status: Former  Types: Cigarettes  Start date: 1976  Smokeless tobacco: Never  Substance and Sexual Activity  Alcohol use: Yes  Drug use: Never   Social Drivers of Health   Food Insecurity: No Food Insecurity (03/08/2023)  Received from [facility]  Hunger Vital  Sign  Worried About Running Out of Food in the Last Year: Never true  Ran Out of Food in the Last Year: Never true  Transportation Needs: Patient Declined (03/08/2023)  Received from [facility] - Transportation  Lack of Transportation (Medical): Patient declined  Lack of Transportation (Non-Medical): Patient declined  Physical Activity: Unknown (03/08/2023)  Received from [facility]  Exercise Vital Sign  Days of Exercise per Week: 5 days  Minutes of Exercise per Session: Patient declined  Stress: No Stress Concern Present (03/08/2023)  Received from [facility] - Occupational Stress Questionnaire  Feeling of Stress : Not at all  Social Connections: Unknown (03/08/2023)  Received from [facility]  Social Connection and Isolation Panel [NHANES]  Frequency of Communication with Friends and Family: Three times a week  Frequency of Social Gatherings with Friends and Family: Three times a week  Attends Religious Services: Patient declined  Active Member of Clubs or Organizations: No  Marital Status: Married  Housing Stability: Unknown (11/02/2023)  Housing Stability Vital Sign  Homeless in the Last Year: No   Objective:   Vitals:  BP: (!) 140/80  Pulse: 69  Temp: 36.7 C (98 F)  SpO2: 96%  Weight: (!) 103.4 kg (228 lb)  Height: 190.5 cm (6\' 3" )  PainSc: 0-No pain   Body mass index is 28.5 kg/m.  Physical Exam   GENERAL APPEARANCE Comfortable, no acute issues Development: normal Gross deformities: none  SKIN Rash, lesions, ulcers: none Induration, erythema: none Nodules: none palpable  EYES Conjunctiva and lids: normal Pupils: equal  EARS, NOSE, MOUTH, THROAT External ears: no lesion or deformity External nose: no lesion or deformity Hearing: grossly normal  NECK Symmetric: yes Trachea: midline Thyroid : no palpable nodules in the thyroid  bed  CHEST/CV Not assessed  ABDOMEN Not  assessed  GENITOURINARY/RECTAL Not assessed  MUSCULOSKELETAL Station and gait: normal Digits and nails: no clubbing or cyanosis Muscle strength: grossly normal all extremities Deformity: none  LYMPHATIC Cervical: none palpable Supraclavicular: none palpable  PSYCHIATRIC Oriented to person, place, and time: yes Mood and affect: normal for situation Judgment and insight: appropriate for situation   Assessment and Plan:   Papillary thyroid  carcinoma (CMS/HHS-HCC) Thyroid  nodule  Patient is referred by his primary care physician for surgical evaluation and management of newly diagnosed papillary thyroid  carcinoma.  Patient provided with a copy of "The Thyroid  Book: Medical and Surgical Treatment of Thyroid  Problems", published by [employer], 16 pages. Book reviewed and explained to patient during visit today.  Today we reviewed his clinical history. We reviewed his recent ultrasound report as well as the cytopathology results. Patient appears to have a solitary nodule in the left thyroid  lobe measuring less than 1 cm in size. Cytopathology demonstrates papillary carcinoma. We discussed the fact that this represents the most common and most favorable type of thyroid  cancer. Patient should be a good candidate for thyroid  lobectomy for definitive treatment. We discussed the surgery. We discussed the size and location of the incision. We discussed risk of surgery including the risk of recurrent laryngeal nerve injury and injury to parathyroid glands. We discussed the hospital stay to be anticipated. We discussed the potential need for thyroid  hormone supplementation. Patient understands and wishes to proceed with  surgery in the near future.  We will request cardiac clearance from his cardiologist. We will also need guidance on management of his Plavix  in the perioperative interval.   Oralee Billow, MD [facility] A [facility] practice Office: [phone]

## 2024-01-04 NOTE — H&P (View-Only) (Signed)
 REFERRING PHYSICIAN: Delene Feinstein, MD  PROVIDER: Preeya Cleckley [place] Kocher, MD   Chief Complaint: New Consultation (Papillary thyroid  carcinoma)  History of Present Illness:  Patient is referred by Dr. Christianna Cowman for surgical evaluation and and management of newly diagnosed papillary thyroid  carcinoma. Patient had been noted on examination about 1 year ago to have a palpable abnormality in the neck. He also has a family history of throat cancer in his brother over 40 years ago. Patient underwent a follow-up ultrasound in January 2025. This shows a normal-sized thyroid  gland with a 9 mm solitary nodule in the left thyroid  lobe. There had been interval development of echogenic foci and fine-needle aspiration biopsy was recommended. Biopsy was performed on October 25, 2023. Cytopathology shows findings consistent with papillary carcinoma. Recent TSH level is normal at 2.41. Patient has never been on thyroid  medication. He has had no prior head or neck surgery. He has had some recent neck discomfort bilaterally. This sounds as if it is muscular located posteriorly in the neck. It may be related to changing pillows at home. Patient presents today to discuss thyroid  surgery for management of papillary thyroid  carcinoma.  Does have a history of coronary artery disease. He has had cardiac stents placed approximately 20 years ago. He continues on Plavix . He is followed by Dr. Dorothye Gathers.  Review of Systems: A complete review of systems was obtained from the patient. I have reviewed this information and discussed as appropriate with the patient. See HPI as well for other ROS.  Review of Systems  Constitutional: Negative.  HENT: Negative.  Eyes: Negative.  Respiratory: Negative.  Cardiovascular: Negative.  Gastrointestinal: Negative.  Genitourinary: Negative.  Musculoskeletal: Positive for neck pain.  Skin: Negative.  Neurological: Negative.  Endo/Heme/Allergies: Negative.   Psychiatric/Behavioral: Negative.    Medical History: Past Medical History:  Diagnosis Date  Arthritis  History of cancer  Thyroid  disease   Patient Active Problem List  Diagnosis  Papillary thyroid  carcinoma (CMS/HHS-HCC)  Thyroid  nodule   Past Surgical History:  Procedure Laterality Date  PROSTATE SURGERY  Shoulder surgery    No Known Allergies  Current Outpatient Medications on File Prior to Visit  Medication Sig Dispense Refill  amLODIPine  (NORVASC ) 10 MG tablet Take 1 tablet by mouth once daily  amLODIPine -atorvastatin  (CADUET) 10-10 mg tablet  atorvastatin  (LIPITOR) 40 MG tablet  carvediloL  (COREG ) 12.5 MG tablet  clopidogreL  (PLAVIX ) 75 mg tablet  FARXIGA  5 mg tablet  fenofibrate  150 mg Cap  hydroCHLOROthiazide  (HYDRODIURIL ) 25 MG tablet  isosorbide  mononitrate (IMDUR ) 30 MG ER tablet  mesalamine  (ASACOL  HD) 800 mg EC tablet TAKE 3 TABLETS (2,400MG  TOTAL) TWO TIMES A DAY  pantoprazole  (PROTONIX ) 40 MG DR tablet Take 1 tablet by mouth once daily  pramipexole  (MIRAPEX ) 1 MG tablet Take 1 tablet by mouth at bedtime  prednisoLONE acetate (PRED FORTE) 1 % ophthalmic suspension INSTILL 1 DROP INTO AFFECTED EYE(S) 4 TIMES A DAY FOR 7 DAYS  simethicone  (MYLICON) 80 MG chewable tablet Take 80 mg by mouth   No current facility-administered medications on file prior to visit.   Family History  Problem Relation Age of Onset  Coronary Artery Disease (Blocked arteries around heart) Father    Social History   Tobacco Use  Smoking Status Former  Types: Cigarettes  Start date: 1976  Smokeless Tobacco Never    Social History   Socioeconomic History  Marital status: Married  Tobacco Use  Smoking status: Former  Types: Cigarettes  Start date: 1976  Smokeless tobacco: Never  Substance and Sexual Activity  Alcohol use: Yes  Drug use: Never   Social Drivers of Health   Food Insecurity: No Food Insecurity (03/08/2023)  Received from [facility]  Hunger Vital  Sign  Worried About Running Out of Food in the Last Year: Never true  Ran Out of Food in the Last Year: Never true  Transportation Needs: Patient Declined (03/08/2023)  Received from [facility] - Transportation  Lack of Transportation (Medical): Patient declined  Lack of Transportation (Non-Medical): Patient declined  Physical Activity: Unknown (03/08/2023)  Received from [facility]  Exercise Vital Sign  Days of Exercise per Week: 5 days  Minutes of Exercise per Session: Patient declined  Stress: No Stress Concern Present (03/08/2023)  Received from [facility] - Occupational Stress Questionnaire  Feeling of Stress : Not at all  Social Connections: Unknown (03/08/2023)  Received from [facility]  Social Connection and Isolation Panel [NHANES]  Frequency of Communication with Friends and Family: Three times a week  Frequency of Social Gatherings with Friends and Family: Three times a week  Attends Religious Services: Patient declined  Active Member of Clubs or Organizations: No  Marital Status: Married  Housing Stability: Unknown (11/02/2023)  Housing Stability Vital Sign  Homeless in the Last Year: No   Objective:   Vitals:  BP: (!) 140/80  Pulse: 69  Temp: 36.7 C (98 F)  SpO2: 96%  Weight: (!) 103.4 kg (228 lb)  Height: 190.5 cm (6\' 3" )  PainSc: 0-No pain   Body mass index is 28.5 kg/m.  Physical Exam   GENERAL APPEARANCE Comfortable, no acute issues Development: normal Gross deformities: none  SKIN Rash, lesions, ulcers: none Induration, erythema: none Nodules: none palpable  EYES Conjunctiva and lids: normal Pupils: equal  EARS, NOSE, MOUTH, THROAT External ears: no lesion or deformity External nose: no lesion or deformity Hearing: grossly normal  NECK Symmetric: yes Trachea: midline Thyroid : no palpable nodules in the thyroid  bed  CHEST/CV Not assessed  ABDOMEN Not  assessed  GENITOURINARY/RECTAL Not assessed  MUSCULOSKELETAL Station and gait: normal Digits and nails: no clubbing or cyanosis Muscle strength: grossly normal all extremities Deformity: none  LYMPHATIC Cervical: none palpable Supraclavicular: none palpable  PSYCHIATRIC Oriented to person, place, and time: yes Mood and affect: normal for situation Judgment and insight: appropriate for situation   Assessment and Plan:   Papillary thyroid  carcinoma (CMS/HHS-HCC) Thyroid  nodule  Patient is referred by his primary care physician for surgical evaluation and management of newly diagnosed papillary thyroid  carcinoma.  Patient provided with a copy of "The Thyroid  Book: Medical and Surgical Treatment of Thyroid  Problems", published by [employer], 16 pages. Book reviewed and explained to patient during visit today.  Today we reviewed his clinical history. We reviewed his recent ultrasound report as well as the cytopathology results. Patient appears to have a solitary nodule in the left thyroid  lobe measuring less than 1 cm in size. Cytopathology demonstrates papillary carcinoma. We discussed the fact that this represents the most common and most favorable type of thyroid  cancer. Patient should be a good candidate for thyroid  lobectomy for definitive treatment. We discussed the surgery. We discussed the size and location of the incision. We discussed risk of surgery including the risk of recurrent laryngeal nerve injury and injury to parathyroid glands. We discussed the hospital stay to be anticipated. We discussed the potential need for thyroid  hormone supplementation. Patient understands and wishes to proceed with  surgery in the near future.  We will request cardiac clearance from his cardiologist. We will also need guidance on management of his Plavix  in the perioperative interval.   Oralee Billow, MD [facility] practice Office: [phone]

## 2024-01-06 NOTE — Progress Notes (Signed)
 COVID Vaccine Completed: yes  Date of COVID positive in last 90 days:  PCP - Tonna Frederic, MD Cardiologist - Dorothye Gathers, MD  Cardiac clearance by Marlyse Single, PA 11/15/23 in Epic   Chest x-ray - n/a EKG - 03/09/23 Epic Stress Test - 05/29/21 Epic ECHO - 2011 Cardiac Cath - 20 years ago Pacemaker/ICD device last checked: n/a Spinal Cord Stimulator: n/a  Bowel Prep - no  Sleep Study - n/a CPAP -   Fasting Blood Sugar - preDM, bo checks at home Checks Blood Sugar _____ times a day  Last dose of GLP1 agonist-  N/A GLP1 instructions:  Hold 7 days before surgery    Last dose of SGLT-2 inhibitors-  Farxiga  SGLT-2 instructions:  Hold 3 days before surgery, last dose 01/13/24   Blood Thinner Instructions:  Plavix , hold 5 days  Aspirin  Instructions: Last Dose: 01/11/24, 0800  Activity level: Can go up a flight of stairs and perform activities of daily living without stopping and without symptoms of chest pain or shortness of breath.   Anesthesia review: HTN, CAD, CHF, preDM, CKD, cardiac stents   Patient denies shortness of breath, fever, cough and chest pain at PAT appointment  Patient verbalized understanding of instructions that were given to them at the PAT appointment. Patient was also instructed that they will need to review over the PAT instructions again at home before surgery.

## 2024-01-06 NOTE — Patient Instructions (Signed)
 SURGICAL WAITING ROOM VISITATION  Patients having surgery or a procedure may have no more than 2 support people in the waiting area - these visitors may rotate.    Children under the age of 35 must have an adult with them who is not the patient.  Due to an increase in RSV and influenza rates and associated hospitalizations, children ages 75 and under may not visit patients in [facility] hospitals.  Visitors with respiratory illnesses are discouraged from visiting and should remain at home.  If the patient needs to stay at the hospital during part of their recovery, the visitor guidelines for inpatient rooms apply. Pre-op nurse will coordinate an appropriate time for 1 support person to accompany patient in pre-op.  This support person may not rotate.    Please refer to the [facility] website for the visitor guidelines for Inpatients (after your surgery is over and you are in a regular room).    Your procedure is scheduled on: 01/17/24   Report to [facility] Main Entrance    Report to admitting at 5:15 AM   Call this number if you have problems the morning of surgery [phone]   Do not eat food :After Midnight.   After Midnight you may have the following liquids until 4:30 AM DAY OF SURGERY  Water Non-Citrus Juices (without pulp, NO RED-Apple, White grape, White cranberry) Black Coffee (NO MILK/CREAM OR CREAMERS, sugar ok)  Clear Tea (NO MILK/CREAM OR CREAMERS, sugar ok) regular and decaf                             Plain Jell-O (NO RED)                                           Fruit ices (not with fruit pulp, NO RED)                                     Popsicles (NO RED)                                                               Sports drinks like Gatorade (NO RED)     The day of surgery:  Drink ONE (1) Pre-Surgery G2 at 4:30 AM the morning of surgery. Drink in one sitting. Do not sip.  This drink was given to you during your hospital  pre-op appointment  visit. Nothing else to drink after completing the  Pre-Surgery G2.          If you have questions, please contact your surgeon's office.   FOLLOW BOWEL PREP AND ANY ADDITIONAL PRE OP INSTRUCTIONS YOU RECEIVED FROM YOUR SURGEON'S OFFICE!!!     Oral Hygiene is also important to reduce your risk of infection.                                    Remember - BRUSH YOUR TEETH THE MORNING OF SURGERY WITH YOUR REGULAR TOOTHPASTE  DENTURES WILL  BE REMOVED PRIOR TO SURGERY PLEASE DO NOT APPLY "Poly grip" OR ADHESIVES!!!   Hold Farxiga  3 days prior to surgery. Last dose 01/13/24.   Stop all vitamins and herbal supplements 7 days before surgery.   Take these medicines the morning of surgery with A SIP OF WATER: Amlodipine , Atorvastatin , Carvedilol , Fenofibrate , Inhalers, Isosorbide , Pantoprazole    These are anesthesia recommendations for holding your anticoagulants.  Please contact your prescribing physician to confirm IF it is safe to hold your anticoagulants for this length of time.   Eliquis Apixaban   72 hours   Xarelto Rivaroxaban   72 hours  Plavix  Clopidogrel    120 hours  Pletal Cilostazol   120 hours    DO NOT TAKE ANY ORAL DIABETIC MEDICATIONS DAY OF YOUR SURGERY                              You may not have any metal on your body including jewelry, and body piercing             Do not wear lotions, powders, cologne, or deodorant              Men may shave face and neck.   Do not bring valuables to the hospital. [facility] IS NOT             RESPONSIBLE   FOR VALUABLES.   Contacts, glasses, dentures or bridgework may not be worn into surgery.   Bring small overnight bag day of surgery.   DO NOT BRING YOUR HOME MEDICATIONS TO THE HOSPITAL. PHARMACY WILL DISPENSE MEDICATIONS LISTED ON YOUR MEDICATION LIST TO YOU DURING YOUR ADMISSION IN THE HOSPITAL!   Special Instructions: Bring a copy of your healthcare power of attorney and living will documents the day of surgery if you  haven't scanned them before.              Please read over the following fact sheets you were given: IF YOU HAVE QUESTIONS ABOUT YOUR PRE-OP INSTRUCTIONS PLEASE CALL [name]    If you received a COVID test during your pre-op visit  it is requested that you wear a mask when out in public, stay away from anyone that may not be feeling well and notify your surgeon if you develop symptoms. If you test positive for Covid or have been in contact with anyone that has tested positive in the last 10 days please notify you surgeon.    [facility] - Preparing for Surgery Before surgery, you can play an important role.  Because skin is not sterile, your skin needs to be as free of germs as possible.  You can reduce the number of germs on your skin by washing with CHG (chlorahexidine gluconate) soap before surgery.  CHG is an antiseptic cleaner which kills germs and bonds with the skin to continue killing germs even after washing. Please DO NOT use if you have an allergy to CHG or antibacterial soaps.  If your skin becomes reddened/irritated stop using the CHG and inform your nurse when you arrive at Short Stay. Do not shave (including legs and underarms) for at least 48 hours prior to the first CHG shower.  You may shave your face/neck.  Please follow these instructions carefully:  1.  Shower with CHG Soap the night before surgery and the  morning of surgery.  2.  If you choose to wash your hair, wash your hair first as usual  with your normal  shampoo.  3.  After you shampoo, rinse your hair and body thoroughly to remove the shampoo.                             4.  Use CHG as you would any other liquid soap.  You can apply chg directly to the skin and wash.  Gently with a scrungie or clean washcloth.  5.  Apply the CHG Soap to your body ONLY FROM THE NECK DOWN.   Do   not use on face/ open                           Wound or open sores. Avoid contact with eyes, ears mouth and   genitals (private  parts).                       Wash face,  Genitals (private parts) with your normal soap.             6.  Wash thoroughly, paying special attention to the area where your    surgery  will be performed.  7.  Thoroughly rinse your body with warm water from the neck down.  8.  DO NOT shower/wash with your normal soap after using and rinsing off the CHG Soap.                9.  Pat yourself dry with a clean towel.            10.  Wear clean pajamas.            11.  Place clean sheets on your bed the night of your first shower and do not  sleep with pets. Day of Surgery : Do not apply any lotions/deodorants the morning of surgery.  Please wear clean clothes to the hospital/surgery center.  FAILURE TO FOLLOW THESE INSTRUCTIONS MAY RESULT IN THE CANCELLATION OF YOUR SURGERY  PATIENT SIGNATURE_________________________________  NURSE SIGNATURE__________________________________  ________________________________________________________________________

## 2024-01-10 ENCOUNTER — Other Ambulatory Visit: Payer: Self-pay

## 2024-01-10 ENCOUNTER — Encounter (HOSPITAL_COMMUNITY)
Admission: RE | Admit: 2024-01-10 | Discharge: 2024-01-10 | Disposition: A | Discharge status: Home / Self Care | Source: Ambulatory Visit | Attending: Surgery | Admitting: Surgery

## 2024-01-10 ENCOUNTER — Encounter (HOSPITAL_COMMUNITY): Payer: Self-pay

## 2024-01-10 VITALS — BP 125/81 | HR 71 | Temp 97.7°F | Resp 16 | Ht 75.0 in | Wt 217.0 lb

## 2024-01-10 DIAGNOSIS — Z955 Presence of coronary angioplasty implant and graft: Secondary | ICD-10-CM | POA: Diagnosis not present

## 2024-01-10 DIAGNOSIS — I2510 Atherosclerotic heart disease of native coronary artery without angina pectoris: Secondary | ICD-10-CM | POA: Insufficient documentation

## 2024-01-10 DIAGNOSIS — I509 Heart failure, unspecified: Secondary | ICD-10-CM | POA: Insufficient documentation

## 2024-01-10 DIAGNOSIS — I130 Hypertensive heart and chronic kidney disease with heart failure and stage 1 through stage 4 chronic kidney disease, or unspecified chronic kidney disease: Secondary | ICD-10-CM | POA: Diagnosis not present

## 2024-01-10 DIAGNOSIS — N183 Chronic kidney disease, stage 3 unspecified: Secondary | ICD-10-CM | POA: Insufficient documentation

## 2024-01-10 DIAGNOSIS — C73 Malignant neoplasm of thyroid gland: Secondary | ICD-10-CM | POA: Insufficient documentation

## 2024-01-10 DIAGNOSIS — E041 Nontoxic single thyroid nodule: Secondary | ICD-10-CM | POA: Insufficient documentation

## 2024-01-10 DIAGNOSIS — I6523 Occlusion and stenosis of bilateral carotid arteries: Secondary | ICD-10-CM | POA: Diagnosis not present

## 2024-01-10 DIAGNOSIS — Z01818 Encounter for other preprocedural examination: Secondary | ICD-10-CM | POA: Diagnosis present

## 2024-01-10 DIAGNOSIS — Z7984 Long term (current) use of oral hypoglycemic drugs: Secondary | ICD-10-CM | POA: Diagnosis not present

## 2024-01-10 DIAGNOSIS — K219 Gastro-esophageal reflux disease without esophagitis: Secondary | ICD-10-CM | POA: Insufficient documentation

## 2024-01-10 DIAGNOSIS — Z7902 Long term (current) use of antithrombotics/antiplatelets: Secondary | ICD-10-CM | POA: Insufficient documentation

## 2024-01-10 DIAGNOSIS — Z8673 Personal history of transient ischemic attack (TIA), and cerebral infarction without residual deficits: Secondary | ICD-10-CM | POA: Insufficient documentation

## 2024-01-10 DIAGNOSIS — Z01812 Encounter for preprocedural laboratory examination: Secondary | ICD-10-CM | POA: Insufficient documentation

## 2024-01-10 HISTORY — DX: Headache, unspecified: R51.9

## 2024-01-10 HISTORY — DX: Prediabetes: R73.03

## 2024-01-10 LAB — BASIC METABOLIC PANEL WITH GFR
Anion gap: 10 (ref 5–15)
BUN: 24 mg/dL — ABNORMAL HIGH (ref 8–23)
CO2: 24 mmol/L (ref 22–32)
Calcium: 9.2 mg/dL (ref 8.9–10.3)
Chloride: 102 mmol/L (ref 98–111)
Creatinine, Ser: 1.27 mg/dL — ABNORMAL HIGH (ref 0.61–1.24)
GFR, Estimated: 55 mL/min — ABNORMAL LOW (ref >60)
Glucose, Bld: 117 mg/dL — ABNORMAL HIGH (ref 70–99)
Potassium: 4 mmol/L (ref 3.5–5.1)
Sodium: 136 mmol/L (ref 135–145)

## 2024-01-10 LAB — CBC
HCT: 43.1 % (ref 39.0–52.0)
Hemoglobin: 13.9 g/dL (ref 13.0–17.0)
MCH: 28.1 pg (ref 26.0–34.0)
MCHC: 32.3 g/dL (ref 30.0–36.0)
MCV: 87.2 fL (ref 80.0–100.0)
Platelets: 269 10*3/uL (ref 150–400)
RBC: 4.94 MIL/uL (ref 4.22–5.81)
RDW: 14.4 % (ref 11.5–15.5)
WBC: 6.4 10*3/uL (ref 4.0–10.5)
nRBC: 0 % (ref 0.0–0.2)

## 2024-01-11 ENCOUNTER — Encounter (HOSPITAL_COMMUNITY): Payer: Self-pay | Admitting: Surgery

## 2024-01-11 NOTE — Progress Notes (Signed)
 Anesthesia Chart Review   Case: [id number] Date/Time: 01/17/24 0715   Procedure: LOBECTOMY, THYROID  (Left)   Anesthesia type: General   Diagnosis:      Malignant neoplasm of thyroid  gland (HCC) [C73]     Nontoxic uninodular goiter [E04.1]   Pre-op diagnosis: papillary thyroid  cancer   Location: [facility] ROOM 02 / [facility]   Surgeons: Oralee Billow, MD       DISCUSSION:88 y.o. former smoker with h/o HTN, GERD, CHF, CAD (s/p BMS to LAD 2000, DES to PLV 2009, low risk stress test 2022), CKD Stage III, papillary thyroid  cancer scheduled for above procedure 01/17/2024 with Dr. Oralee Billow.   Per cardiology preoperative evaluation 11/15/2023, "Mr. Cone's perioperative risk of a major cardiac event is high at 6.6% according to the Revised Cardiac Risk Index (RCRI).  However, His functional capacity is good at 4.31 METs according to the [facility] Activity Status Index (DASI). Recommendations: Therefore, According to ACC/AHA guidelines, no further cardiovascular testing needed.  The patient may proceed to surgery at acceptable risk.   Antiplatelet and/or Anticoagulation Recommendations: -Clopidogrel  (Plavix ) can be held for 5 days prior to his surgery and resumed as soon as possible post op. -Ideally, we prefer the patient start aspirin  81 mg daily once he is off of clopidogrel  and continue this throughout the perioperative period.  Postoperatively, the patient can stop aspirin  and resume clopidogrel , when felt to be safe.   -If it is felt that the risk of bleeding is too great to take aspirin  in the perioperative period, the patient can forego taking aspirin . "  VS: BP 125/81   Pulse 71   Temp 36.5 C (Oral)   Resp 16   Ht 6\' 3"  (1.905 m)   Wt 98.4 kg   SpO2 96%   BMI 27.12 kg/m   PROVIDERS: Tonna Frederic, MD is PCP   Cardiologist - Dorothye Gathers, MD   LABS: Labs reviewed: Acceptable for surgery. (all labs ordered are listed, but only abnormal results are displayed)  Labs Reviewed  BASIC  METABOLIC PANEL WITH GFR - Abnormal; Notable for the following components:      Result Value   Glucose, Bld 117 (*)    BUN 24 (*)    Creatinine, Ser 1.27 (*)    GFR, Estimated 55 (*)    All other components within normal limits  CBC     IMAGES:   EKG:   CV: Myocardial Perfusion 05/29/21   The study is normal. The study is low risk.   No ST deviation was noted.   Left ventricular function is normal. Nuclear stress EF: 58 %. The left ventricular ejection fraction is normal (55-65%). End diastolic cavity size is normal.   Prior study available for comparison from 05/06/2016.   Previously described inferobasal defect is not seen on the current study.   Low risk stress nuclear study with normal perfusion and normal left ventricular regional and global systolic function. 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   Pre-diabetes    PROSTATE CANCER, HX OF 07/07/2007   s/p prostatectomy   RLS (restless legs syndrome)    S/P skin biopsy 06/12/2019   Ulcer    ULCERATIVE COLITIS, LEFT SIDED 2000   URTICARIA, ALLERGIC 08/05/2009   VENOUS INSUFFICIENCY 07/28/2010    Past Surgical History:  Procedure Laterality Date   ANGIOPLASTY  1990's/2004   stent placement 2   COLONOSCOPY W/ BIOPSIES AND POLYPECTOMY  06/06/2009   adenomatous polyp, diverticulosis, colitis   ENDOVENOUS ABLATION SAPHENOUS  VEIN W/ LASER Left 07/09/2021   endovenous laser ablation left greater saphenous vein and stab phlebectomy 10-20 incisions left leg by Kirtland Perfect MD   INGUINAL HERNIA REPAIR     x x   ORIF FIBULA FRACTURE Left 03/02/2017   Procedure: OPEN REDUCTION INTERNAL FIXATION (ORIF) LEFT DISTAL FIBULA FRACTURE WITH DELTOID LIGAMENT REPAIR;  Surgeon: Shirlee Dotter, MD;  Location: [facility] OR;  Service: Orthopedics;  Laterality: Left;   PROSTATECTOMY     RETINAL LASER PROCEDURE Right 2016 & 2017    ROTATOR CUFF REPAIR Left    SKIN BIOPSY  02/26/2020   Left Dorsum Bridge of Nose and Right Upper Abdomen   TONSILLECTOMY      MEDICATIONS:  amLODipine  (NORVASC ) 10 MG tablet   atorvastatin  (LIPITOR) 40 MG tablet   carvedilol  (COREG ) 12.5 MG tablet   clopidogrel  (PLAVIX ) 75 MG tablet   dapagliflozin  propanediol (FARXIGA ) 10 MG TABS tablet   fenofibrate  (TRICOR ) 145 MG tablet   hydrochlorothiazide  (HYDRODIURIL ) 25 MG tablet   isosorbide  mononitrate (IMDUR ) 60 MG 24 hr tablet   methocarbamol  (ROBAXIN ) 500 MG tablet   nitroGLYCERIN  (NITROSTAT ) 0.4 MG SL tablet   pantoprazole  (PROTONIX ) 40 MG tablet   pramipexole  (MIRAPEX ) 1 MG tablet    ipratropium-albuterol  (DUONEB) 0.5-2.5 (3) MG/3ML nebulizer solution 3 mL    [facility] Ward, PA-C [facility] Pre-Surgical Testing [phone]

## 2024-01-15 ENCOUNTER — Encounter (HOSPITAL_COMMUNITY): Payer: Self-pay | Admitting: Surgery

## 2024-01-17 ENCOUNTER — Encounter (HOSPITAL_COMMUNITY)
Admission: RE | Disposition: A | Discharge status: Home / Self Care | Payer: Self-pay | Source: Ambulatory Visit | Attending: Surgery

## 2024-01-17 ENCOUNTER — Ambulatory Visit (HOSPITAL_COMMUNITY)
Admission: RE | Admit: 2024-01-17 | Discharge: 2024-01-18 | Disposition: A | Discharge status: Home / Self Care | Source: Ambulatory Visit | Attending: Surgery | Admitting: Surgery

## 2024-01-17 ENCOUNTER — Ambulatory Visit (HOSPITAL_COMMUNITY): Payer: Self-pay | Admitting: Physician Assistant

## 2024-01-17 ENCOUNTER — Encounter (HOSPITAL_COMMUNITY): Payer: Self-pay | Admitting: Surgery

## 2024-01-17 ENCOUNTER — Other Ambulatory Visit: Payer: Self-pay

## 2024-01-17 ENCOUNTER — Ambulatory Visit (HOSPITAL_COMMUNITY): Admitting: Anesthesiology

## 2024-01-17 DIAGNOSIS — Z7902 Long term (current) use of antithrombotics/antiplatelets: Secondary | ICD-10-CM | POA: Insufficient documentation

## 2024-01-17 DIAGNOSIS — N289 Disorder of kidney and ureter, unspecified: Secondary | ICD-10-CM | POA: Insufficient documentation

## 2024-01-17 DIAGNOSIS — Z8711 Personal history of peptic ulcer disease: Secondary | ICD-10-CM | POA: Diagnosis not present

## 2024-01-17 DIAGNOSIS — C73 Malignant neoplasm of thyroid gland: Secondary | ICD-10-CM | POA: Diagnosis present

## 2024-01-17 DIAGNOSIS — I10 Essential (primary) hypertension: Secondary | ICD-10-CM | POA: Diagnosis not present

## 2024-01-17 DIAGNOSIS — K219 Gastro-esophageal reflux disease without esophagitis: Secondary | ICD-10-CM | POA: Diagnosis not present

## 2024-01-17 DIAGNOSIS — Z87891 Personal history of nicotine dependence: Secondary | ICD-10-CM | POA: Diagnosis not present

## 2024-01-17 DIAGNOSIS — I2510 Atherosclerotic heart disease of native coronary artery without angina pectoris: Secondary | ICD-10-CM | POA: Insufficient documentation

## 2024-01-17 DIAGNOSIS — Z79899 Other long term (current) drug therapy: Secondary | ICD-10-CM | POA: Insufficient documentation

## 2024-01-17 DIAGNOSIS — E0590 Thyrotoxicosis, unspecified without thyrotoxic crisis or storm: Secondary | ICD-10-CM | POA: Diagnosis not present

## 2024-01-17 DIAGNOSIS — Z955 Presence of coronary angioplasty implant and graft: Secondary | ICD-10-CM | POA: Diagnosis not present

## 2024-01-17 DIAGNOSIS — E041 Nontoxic single thyroid nodule: Secondary | ICD-10-CM | POA: Diagnosis present

## 2024-01-17 DIAGNOSIS — Z808 Family history of malignant neoplasm of other organs or systems: Secondary | ICD-10-CM | POA: Diagnosis not present

## 2024-01-17 DIAGNOSIS — M1990 Unspecified osteoarthritis, unspecified site: Secondary | ICD-10-CM | POA: Diagnosis not present

## 2024-01-17 DIAGNOSIS — Z8249 Family history of ischemic heart disease and other diseases of the circulatory system: Secondary | ICD-10-CM | POA: Insufficient documentation

## 2024-01-17 HISTORY — PX: THYROID LOBECTOMY: SHX420

## 2024-01-17 LAB — GLUCOSE, CAPILLARY: Glucose-Capillary: 128 mg/dL — ABNORMAL HIGH (ref 70–99)

## 2024-01-17 SURGERY — LOBECTOMY, THYROID
Anesthesia: General | Laterality: Left

## 2024-01-17 MED ORDER — FENTANYL CITRATE (PF) 100 MCG/2ML IJ SOLN
INTRAMUSCULAR | Status: AC
  Filled 2024-01-17: qty 2

## 2024-01-17 MED ORDER — OXYCODONE HCL 5 MG PO TABS: 5.0000 mg | ORAL_TABLET | ORAL | Status: DC | PRN

## 2024-01-17 MED ORDER — ONDANSETRON 4 MG PO TBDP: 4.0000 mg | ORAL_TABLET | Freq: Four times a day (QID) | ORAL | Status: DC | PRN

## 2024-01-17 MED ORDER — ISOSORBIDE MONONITRATE ER 60 MG PO TB24
60.0000 mg | ORAL_TABLET | Freq: Every day | ORAL | Status: DC
  Administered 2024-01-18: 60 mg via ORAL
  Filled 2024-01-17: qty 1

## 2024-01-17 MED ORDER — CIPROFLOXACIN IN D5W 400 MG/200ML IV SOLN
400.0000 mg | INTRAVENOUS | Status: AC
  Administered 2024-01-17: 400 mg via INTRAVENOUS
  Filled 2024-01-17: qty 200

## 2024-01-17 MED ORDER — 0.9 % SODIUM CHLORIDE (POUR BTL) OPTIME
TOPICAL | Status: DC | PRN
Start: 2024-01-17 — End: 2024-01-17
  Administered 2024-01-17: 1000 mL

## 2024-01-17 MED ORDER — CHLORHEXIDINE GLUCONATE CLOTH 2 % EX PADS: 6.0000 | MEDICATED_PAD | Freq: Once | CUTANEOUS | Status: DC

## 2024-01-17 MED ORDER — PRAMIPEXOLE DIHYDROCHLORIDE 0.25 MG PO TABS
1.0000 mg | ORAL_TABLET | Freq: Every day | ORAL | Status: DC
  Administered 2024-01-17: 1 mg via ORAL
  Filled 2024-01-17: qty 4

## 2024-01-17 MED ORDER — PANTOPRAZOLE SODIUM 40 MG PO TBEC
40.0000 mg | DELAYED_RELEASE_TABLET | Freq: Every day | ORAL | Status: DC
Start: 2024-01-18 — End: 2024-01-18
  Administered 2024-01-18: 40 mg via ORAL
  Filled 2024-01-17: qty 1

## 2024-01-17 MED ORDER — HYDROCHLOROTHIAZIDE 25 MG PO TABS
25.0000 mg | ORAL_TABLET | Freq: Every day | ORAL | Status: DC
Start: 2024-01-17 — End: 2024-01-18
  Administered 2024-01-17 – 2024-01-18 (×2): 25 mg via ORAL
  Filled 2024-01-17 (×2): qty 1

## 2024-01-17 MED ORDER — AMLODIPINE BESYLATE 10 MG PO TABS
10.0000 mg | ORAL_TABLET | Freq: Every day | ORAL | Status: DC
  Administered 2024-01-18: 10 mg via ORAL
  Filled 2024-01-17: qty 1

## 2024-01-17 MED ORDER — DAPAGLIFLOZIN PROPANEDIOL 10 MG PO TABS
10.0000 mg | ORAL_TABLET | Freq: Every day | ORAL | Status: DC
  Administered 2024-01-18: 10 mg via ORAL
  Filled 2024-01-17: qty 1

## 2024-01-17 MED ORDER — CARVEDILOL 12.5 MG PO TABS
12.5000 mg | ORAL_TABLET | Freq: Every day | ORAL | Status: DC
  Administered 2024-01-18: 12.5 mg via ORAL
  Filled 2024-01-17: qty 1

## 2024-01-17 MED ORDER — PROPOFOL 10 MG/ML IV BOLUS
INTRAVENOUS | Status: AC
  Filled 2024-01-17: qty 20

## 2024-01-17 MED ORDER — SODIUM CHLORIDE 0.45 % IV SOLN: INTRAVENOUS | Status: DC

## 2024-01-17 MED ORDER — LIDOCAINE HCL (CARDIAC) PF 100 MG/5ML IV SOSY
PREFILLED_SYRINGE | INTRAVENOUS | Status: DC | PRN
  Administered 2024-01-17: 80 mg via INTRAVENOUS

## 2024-01-17 MED ORDER — HYDROMORPHONE HCL 1 MG/ML IJ SOLN
INTRAMUSCULAR | Status: AC
  Filled 2024-01-17: qty 1

## 2024-01-17 MED ORDER — OXYCODONE HCL 5 MG PO TABS: 5.0000 mg | ORAL_TABLET | Freq: Once | ORAL | Status: DC | PRN

## 2024-01-17 MED ORDER — HYDROMORPHONE HCL 1 MG/ML IJ SOLN
0.2500 mg | INTRAMUSCULAR | Status: DC | PRN
  Administered 2024-01-17 (×2): 0.25 mg via INTRAVENOUS
  Administered 2024-01-17 (×2): 0.5 mg via INTRAVENOUS

## 2024-01-17 MED ORDER — ROCURONIUM BROMIDE 10 MG/ML (PF) SYRINGE
PREFILLED_SYRINGE | INTRAVENOUS | Status: AC
  Filled 2024-01-17: qty 10

## 2024-01-17 MED ORDER — TRAMADOL HCL 50 MG PO TABS: 50.0000 mg | ORAL_TABLET | Freq: Four times a day (QID) | ORAL | Status: DC | PRN

## 2024-01-17 MED ORDER — ACETAMINOPHEN 325 MG PO TABS: 650.0000 mg | ORAL_TABLET | Freq: Four times a day (QID) | ORAL | Status: DC | PRN

## 2024-01-17 MED ORDER — INSULIN ASPART 100 UNIT/ML IJ SOLN: 0.0000 [IU] | INTRAMUSCULAR | Status: DC | PRN

## 2024-01-17 MED ORDER — ONDANSETRON HCL 4 MG/2ML IJ SOLN
INTRAMUSCULAR | Status: DC | PRN
  Administered 2024-01-17: 4 mg via INTRAVENOUS

## 2024-01-17 MED ORDER — ONDANSETRON HCL 4 MG/2ML IJ SOLN: 4.0000 mg | Freq: Once | INTRAMUSCULAR | Status: DC | PRN

## 2024-01-17 MED ORDER — PHENYLEPHRINE 80 MCG/ML (10ML) SYRINGE FOR IV PUSH (FOR BLOOD PRESSURE SUPPORT)
PREFILLED_SYRINGE | INTRAVENOUS | Status: DC | PRN
  Administered 2024-01-17: 80 ug via INTRAVENOUS

## 2024-01-17 MED ORDER — ONDANSETRON HCL 4 MG/2ML IJ SOLN
INTRAMUSCULAR | Status: AC
  Filled 2024-01-17: qty 2

## 2024-01-17 MED ORDER — DEXAMETHASONE SODIUM PHOSPHATE 10 MG/ML IJ SOLN
INTRAMUSCULAR | Status: AC
  Filled 2024-01-17: qty 1

## 2024-01-17 MED ORDER — LACTATED RINGERS IV SOLN: INTRAVENOUS | Status: DC

## 2024-01-17 MED ORDER — HYDROMORPHONE HCL 1 MG/ML IJ SOLN: 1.0000 mg | INTRAMUSCULAR | Status: DC | PRN

## 2024-01-17 MED ORDER — DEXAMETHASONE SODIUM PHOSPHATE 10 MG/ML IJ SOLN
INTRAMUSCULAR | Status: DC | PRN
  Administered 2024-01-17: 10 mg via INTRAVENOUS

## 2024-01-17 MED ORDER — ORAL CARE MOUTH RINSE: 15.0000 mL | Freq: Once | OROMUCOSAL | Status: AC

## 2024-01-17 MED ORDER — PROPOFOL 10 MG/ML IV BOLUS
INTRAVENOUS | Status: DC | PRN
  Administered 2024-01-17: 130 mg via INTRAVENOUS

## 2024-01-17 MED ORDER — PHENYLEPHRINE HCL-NACL 20-0.9 MG/250ML-% IV SOLN
INTRAVENOUS | Status: AC
  Filled 2024-01-17: qty 250

## 2024-01-17 MED ORDER — PHENYLEPHRINE HCL-NACL 20-0.9 MG/250ML-% IV SOLN
INTRAVENOUS | Status: DC | PRN
  Administered 2024-01-17: 10 ug/min via INTRAVENOUS

## 2024-01-17 MED ORDER — ROCURONIUM BROMIDE 10 MG/ML (PF) SYRINGE
PREFILLED_SYRINGE | INTRAVENOUS | Status: DC | PRN
  Administered 2024-01-17: 70 mg via INTRAVENOUS

## 2024-01-17 MED ORDER — ACETAMINOPHEN 500 MG PO TABS
1000.0000 mg | ORAL_TABLET | Freq: Once | ORAL | Status: AC
  Administered 2024-01-17: 1000 mg via ORAL
  Filled 2024-01-17: qty 2

## 2024-01-17 MED ORDER — CHLORHEXIDINE GLUCONATE 0.12 % MT SOLN
15.0000 mL | Freq: Once | OROMUCOSAL | Status: AC
  Administered 2024-01-17: 15 mL via OROMUCOSAL

## 2024-01-17 MED ORDER — PHENYLEPHRINE 80 MCG/ML (10ML) SYRINGE FOR IV PUSH (FOR BLOOD PRESSURE SUPPORT)
PREFILLED_SYRINGE | INTRAVENOUS | Status: AC
  Filled 2024-01-17: qty 10

## 2024-01-17 MED ORDER — OXYCODONE HCL 5 MG/5ML PO SOLN: 5.0000 mg | Freq: Once | ORAL | Status: DC | PRN

## 2024-01-17 MED ORDER — SUGAMMADEX SODIUM 200 MG/2ML IV SOLN
INTRAVENOUS | Status: DC | PRN
  Administered 2024-01-17: 200 mg via INTRAVENOUS

## 2024-01-17 MED ORDER — ONDANSETRON HCL 4 MG/2ML IJ SOLN: 4.0000 mg | Freq: Four times a day (QID) | INTRAMUSCULAR | Status: DC | PRN

## 2024-01-17 MED ORDER — FENTANYL CITRATE (PF) 100 MCG/2ML IJ SOLN
INTRAMUSCULAR | Status: DC | PRN
  Administered 2024-01-17: 25 ug via INTRAVENOUS
  Administered 2024-01-17 (×3): 50 ug via INTRAVENOUS
  Administered 2024-01-17: 25 ug via INTRAVENOUS

## 2024-01-17 MED ORDER — LIDOCAINE HCL (PF) 2 % IJ SOLN
INTRAMUSCULAR | Status: AC
  Filled 2024-01-17: qty 5

## 2024-01-17 MED ORDER — ACETAMINOPHEN 650 MG RE SUPP: 650.0000 mg | Freq: Four times a day (QID) | RECTAL | Status: DC | PRN

## 2024-01-17 SURGICAL SUPPLY — 27 items
BAG COUNTER SPONGE SURGICOUNT (BAG) ×1 IMPLANT
BLADE SURG 15 STRL LF DISP TIS (BLADE) ×1 IMPLANT
CHLORAPREP W/TINT 26 (MISCELLANEOUS) ×1 IMPLANT
CLIP TI MEDIUM 6 (CLIP) ×2 IMPLANT
CLIP TI WIDE RED SMALL 6 (CLIP) ×2 IMPLANT
COVER SURGICAL LIGHT HANDLE (MISCELLANEOUS) ×1 IMPLANT
DERMABOND ADVANCED .7 DNX12 (GAUZE/BANDAGES/DRESSINGS) ×1 IMPLANT
DRAPE LAPAROTOMY T 98X78 PEDS (DRAPES) ×1 IMPLANT
DRAPE UTILITY XL STRL (DRAPES) ×1 IMPLANT
ELECT PENCIL ROCKER SW 15FT (MISCELLANEOUS) ×1 IMPLANT
ELECT REM PT RETURN 15FT ADLT (MISCELLANEOUS) ×1 IMPLANT
GAUZE 4X4 16PLY ~~LOC~~+RFID DBL (SPONGE) ×1 IMPLANT
GLOVE SURG ORTHO 8.0 STRL STRW (GLOVE) ×1 IMPLANT
GOWN STRL REUS W/ TWL XL LVL3 (GOWN DISPOSABLE) ×2 IMPLANT
HEMOSTAT SURGICEL 2X4 FIBR (HEMOSTASIS) ×1 IMPLANT
ILLUMINATOR WAVEGUIDE N/F (MISCELLANEOUS) ×1 IMPLANT
KIT BASIN OR (CUSTOM PROCEDURE TRAY) ×1 IMPLANT
KIT TURNOVER KIT A (KITS) IMPLANT
PACK BASIC VI WITH GOWN DISP (CUSTOM PROCEDURE TRAY) ×1 IMPLANT
PAD MAGNETIC INSTR ST 16X20 (MISCELLANEOUS) ×1 IMPLANT
SHEARS HARMONIC 9CM CVD (BLADE) ×1 IMPLANT
SUT MNCRL AB 4-0 PS2 18 (SUTURE) ×1 IMPLANT
SUT SILK 3 0 SH 30 (SUTURE) ×1 IMPLANT
SUT VIC AB 3-0 SH 18 (SUTURE) ×2 IMPLANT
SYR BULB IRRIG 60ML STRL (SYRINGE) ×1 IMPLANT
TOWEL OR 17X26 10 PK STRL BLUE (TOWEL DISPOSABLE) ×1 IMPLANT
TUBING CONNECTING 10 (TUBING) ×1 IMPLANT

## 2024-01-17 NOTE — Anesthesia Postprocedure Evaluation (Signed)
 Anesthesia Post Note  Patient: Kristopher Wilson  Procedure(s) Performed: LOBECTOMY, THYROID  (Left)     Patient location during evaluation: PACU Anesthesia Type: General Level of consciousness: awake and alert, oriented and patient cooperative Pain management: pain level controlled Vital Signs Assessment: post-procedure vital signs reviewed and stable Respiratory status: spontaneous breathing, nonlabored ventilation and respiratory function stable Cardiovascular status: blood pressure returned to baseline and stable Postop Assessment: no apparent nausea or vomiting Anesthetic complications: no   No notable events documented.  Last Vitals:  Vitals:   01/17/24 1015 01/17/24 1031  BP: 119/67 130/73  Pulse: (!) 55 (!) 54  Resp: 12 18  Temp:    SpO2: 95% 95%    Last Pain:  Vitals:   01/17/24 1026  TempSrc:   PainSc: 0-No pain                 Jacquelyne Matte

## 2024-01-17 NOTE — Anesthesia Procedure Notes (Signed)
 Procedure Name: Intubation Date/Time: 01/17/2024 7:27 AM  Performed by: Vella Gey, CRNAPre-anesthesia Checklist: Patient identified, Emergency Drugs available, Suction available and Patient being monitored Patient Re-evaluated:Patient Re-evaluated prior to induction Oxygen Delivery Method: Circle system utilized Preoxygenation: Pre-oxygenation with 100% oxygen Induction Type: IV induction Ventilation: Mask ventilation without difficulty Laryngoscope Size: Miller and 3 Grade View: Grade I Tube type: Oral Tube size: 8.0 mm Number of attempts: 1 Airway Equipment and Method: Stylet and Oral airway Placement Confirmation: ETT inserted through vocal cords under direct vision, positive ETCO2 and breath sounds checked- equal and bilateral Secured at: 23 cm Tube secured with: Tape Dental Injury: Teeth and Oropharynx as per pre-operative assessment

## 2024-01-17 NOTE — Interval H&P Note (Signed)
 History and Physical Interval Note:  01/17/2024 7:09 AM  Kristopher Wilson  has presented today for surgery, with the diagnosis of papillary thyroid  cancer.  The various methods of treatment have been discussed with the patient and family. After consideration of risks, benefits and other options for treatment, the patient has consented to    Procedure(s): LOBECTOMY, THYROID  (Left) as a surgical intervention.    The patient's history has been reviewed, patient examined, no change in status, stable for surgery.  I have reviewed the patient's chart and labs.  Questions were answered to the patient's satisfaction.    Oralee Billow, MD [facility] practice Office: [phone]   Oralee Billow

## 2024-01-17 NOTE — Anesthesia Preprocedure Evaluation (Addendum)
 Anesthesia Evaluation  Patient identified by MRN, date of birth, ID band Patient awake    Reviewed: Allergy & Precautions, NPO status , Patient's Chart, lab work & pertinent test results  Airway Mallampati: III  TM Distance: >3 FB Neck ROM: Full    Dental no notable dental hx. (+) Chipped,    Pulmonary former smoker   Pulmonary exam normal breath sounds clear to auscultation       Cardiovascular hypertension (125/71 preop), Pt. on medications + CAD and + Cardiac Stents (2 stents 20 years ago, on plavix )  Normal cardiovascular exam Rhythm:Regular Rate:Normal  Stress test 2022 low risk   Neuro/Psych  Headaches  negative psych ROS   GI/Hepatic PUD,GERD  Controlled and Medicated,,(+)       alcohol use  Endo/Other   Hyperthyroidism   Renal/GU Renal diseaseCr 1.27  negative genitourinary   Musculoskeletal  (+) Arthritis , Osteoarthritis,    Abdominal   Peds  Hematology negative hematology ROS (+)   Anesthesia Other Findings Plavix  LD: 5/6  Reproductive/Obstetrics negative OB ROS                             Anesthesia Physical Anesthesia Plan  ASA: 3  Anesthesia Plan: General   Post-op Pain Management: Tylenol  PO (pre-op)*   Induction: Intravenous  PONV Risk Score and Plan: 2 and Ondansetron , Dexamethasone  and Treatment may vary due to age or medical condition  Airway Management Planned: Oral ETT  Additional Equipment: None  Intra-op Plan:   Post-operative Plan: Extubation in OR  Informed Consent: I have reviewed the patients History and Physical, chart, labs and discussed the procedure including the risks, benefits and alternatives for the proposed anesthesia with the patient or authorized representative who has indicated his/her understanding and acceptance.     Dental advisory given  Plan Discussed with: CRNA  Anesthesia Plan Comments:        Anesthesia Quick  Evaluation

## 2024-01-17 NOTE — Transfer of Care (Signed)
 Immediate Anesthesia Transfer of Care Note  Patient: Kristopher Wilson  Procedure(s) Performed: LOBECTOMY, THYROID  (Left)  Patient Location: PACU  Anesthesia Type:General  Level of Consciousness: awake and alert   Airway & Oxygen Therapy: Patient Spontanous Breathing and Patient connected to face mask oxygen  Post-op Assessment: Report given to RN and Post -op Vital signs reviewed and stable  Post vital signs: Reviewed and stable  Last Vitals:  Vitals Value Taken Time  BP 148/80 01/17/24 0853  Temp    Pulse 58 01/17/24 0856  Resp 13 01/17/24 0856  SpO2 98 % 01/17/24 0856  Vitals shown include unfiled device data.  Last Pain:  Vitals:   01/17/24 0617  TempSrc: Oral  PainSc:          Complications: No notable events documented.

## 2024-01-17 NOTE — Op Note (Signed)
 Procedure Note  Pre-operative Diagnosis:  Papillary thyroid  carcinoma  Post-operative Diagnosis:  same  Surgeon:  Oralee Billow, MD  Assistant:  none   Procedure:  Left thyroid  lobectomy and isthmusectomy  Anesthesia:  General  Estimated Blood Loss:  15 cc  Drains: none         Specimen: thyroid  lobe to pathology  Indications:  Patient is referred by Dr. Christianna Cowman for surgical evaluation and and management of newly diagnosed papillary thyroid  carcinoma. Patient had been noted on examination about 1 year ago to have a palpable abnormality in the neck. He also has a family history of throat cancer in his brother over 40 years ago. Patient underwent a follow-up ultrasound in January 2025. This shows a normal-sized thyroid  gland with a 9 mm solitary nodule in the left thyroid  lobe. There had been interval development of echogenic foci and fine-needle aspiration biopsy was recommended. Biopsy was performed on October 25, 2023. Cytopathology shows findings consistent with papillary carcinoma. Recent TSH level is normal at 2.41. Patient has never been on thyroid  medication. He has had no prior head or neck surgery. He has had some recent neck discomfort bilaterally. This sounds as if it is muscular located posteriorly in the neck. It may be related to changing pillows at home. Patient presents today to discuss thyroid  surgery for management of papillary thyroid  carcinoma.   Procedure Details: Procedure was done in OR #2 at the [facility]. The patient was brought to the operating room and placed in a supine position on the operating room table. Following administration of general anesthesia, the patient was positioned and then prepped and draped in the usual aseptic fashion. After ascertaining that an adequate level of anesthesia had been achieved, a small Kocher incision was made with #15 blade. Dissection was carried through subcutaneous tissues and platysma. Hemostasis was achieved  with the electrocautery. Skin flaps were elevated cephalad and caudad from the thyroid  notch to the sternal notch. A self-retaining retractor was placed for exposure. Strap muscles were incised in the midline and dissection was begun on the left side. Strap muscles were reflected laterally. The left thyroid  lobe was normal in size with a solitary central nodule. The lobe was gently mobilized with blunt dissection. Superior pole vessels were dissected out and divided individually between small and medium ligaclips with the harmonic scalpel. The thyroid  lobe was rolled anteriorly. Branches of the inferior thyroid  artery were divided between small ligaclips with the harmonic scalpel. Inferior venous tributaries were divided between ligaclips. Both the superior and inferior parathyroid glands were identified and preserved on their vascular pedicles. The recurrent laryngeal nerve was identified and preserved along its course. The ligament of Katheryne Pane was released with the electrocautery and the gland was mobilized onto the anterior trachea. Isthmus was mobilized across the midline. There was a moderate sized pyramidal lobe present which was resected with the isthmus. The thyroid  parenchyma was transected at the junction of the isthmus and contralateral thyroid  lobe with the harmonic scalpel. A suture was used to mark the isthmus margin. The thyroid  lobe and isthmus were submitted to pathology for review.  The entire field was palpated for evidence of lymphadenopathy or extra-thyroidal disease.  No worrisome findings were noted.  No enlarged lymph nodes were identified.  The neck was irrigated with warm saline. Fibrillar was placed throughout the operative field. Strap muscles were approximated in the midline with interrupted 3-0 Vicryl sutures. Platysma was closed with interrupted 3-0 Vicryl sutures. Skin was closed with a  running 4-0 Monocryl subcuticular suture.  Wound was washed and dried and Dermabond was applied.  The patient was awakened from anesthesia and brought to the recovery room. The patient tolerated the procedure well.   Oralee Billow, MD [facility] Office: [phone]

## 2024-01-18 ENCOUNTER — Encounter (HOSPITAL_COMMUNITY): Payer: Self-pay | Admitting: Surgery

## 2024-01-18 ENCOUNTER — Ambulatory Visit: Payer: Self-pay | Admitting: Surgery

## 2024-01-18 DIAGNOSIS — C73 Malignant neoplasm of thyroid gland: Secondary | ICD-10-CM | POA: Diagnosis not present

## 2024-01-18 LAB — SURGICAL PATHOLOGY

## 2024-01-18 MED ORDER — TRAMADOL HCL 50 MG PO TABS: 50.0000 mg | ORAL_TABLET | Freq: Four times a day (QID) | ORAL | 0 refills | Status: DC | PRN

## 2024-01-18 NOTE — Discharge Instructions (Signed)

## 2024-01-18 NOTE — TOC Transition Note (Signed)
 Transition of Care [facility] - Discharge Note   Patient Details  Name: Kristopher Wilson MRN: [id number] Date of Birth: 1936/05/06  Transition of Care [facility]) CM/SW Contact:  Bari Leys, RN Phone Number: 01/18/2024, 10:22 AM   Clinical Narrative:   Patient dc prior to [facility] assessment. No TOC needs.           Patient Goals and CMS Choice            Discharge Placement                       Discharge Plan and Services Additional resources added to the After Visit Summary for                                       Social Drivers of Health (SDOH) Interventions SDOH Screenings   Food Insecurity: No Food Insecurity (01/17/2024)  Housing: Unknown (01/17/2024)  Transportation Needs: No Transportation Needs (01/17/2024)  Utilities: Not At Risk (01/17/2024)  Alcohol Screen: Low Risk  (12/10/2023)  Depression (PHQ2-9): Low Risk  (03/12/2023)  Financial Resource Strain: Patient Declined (12/10/2023)  Physical Activity: Sufficiently Active (12/10/2023)  Social Connections: Socially Integrated (01/17/2024)  Stress: No Stress Concern Present (12/10/2023)  Tobacco Use: Medium Risk (01/17/2024)     Readmission Risk Interventions     No data to display

## 2024-01-18 NOTE — Discharge Summary (Signed)
 Physician Discharge Summary   Patient ID: Kristopher Wilson MRN: [id number] DOB/AGE: 86-May-1937 36 y.o.  Admit date: 01/17/2024  Discharge date: 01/18/2024  Discharge Diagnoses:  Principal Problem:   Papillary thyroid  carcinoma [facility])   Discharged Condition: good  Hospital Course: Patient was admitted for observation following thyroid  surgery.  Post op course was uncomplicated.  Pain was well controlled.  Tolerated diet.  Patient was prepared for discharge home on POD#1.  Consults: None  Treatments: surgery: left thyroid  lobectomy  Discharge Exam: Blood pressure 137/76, pulse 77, temperature 97.8 F (36.6 C), temperature source Oral, resp. rate 18, height 6\' 3"  (1.905 m), weight 98.4 kg, SpO2 94%. HEENT - clear Neck - wound dry and intact with Dermabond; voice normal; minimal STS  Disposition: Home  Discharge Instructions     Diet - low sodium heart healthy   Complete by: As directed    Increase activity slowly   Complete by: As directed    No dressing needed   Complete by: As directed       Allergies as of 01/18/2024       Reactions   Keflex [cephalexin] Hives        Medication List     TAKE these medications    amLODipine  10 MG tablet Commonly known as: NORVASC  TAKE 1 TABLET BY MOUTH EVERY DAY   atorvastatin  40 MG tablet Commonly known as: LIPITOR TAKE 1 TABLET DAILY   carvedilol  12.5 MG tablet Commonly known as: COREG  TAKE 1 TABLET DAILY   clopidogrel  75 MG tablet Commonly known as: PLAVIX  TAKE 1 TABLET DAILY   dapagliflozin  propanediol 10 MG Tabs tablet Commonly known as: FARXIGA  Take 1 tablet (10 mg total) by mouth daily before breakfast.   fenofibrate  145 MG tablet Commonly known as: TRICOR  TAKE 1 TABLET DAILY   hydrochlorothiazide  25 MG tablet Commonly known as: HYDRODIURIL  TAKE 1 TABLET DAILY   isosorbide  mononitrate 60 MG 24 hr tablet Commonly known as: IMDUR  TAKE 1 TABLET BY MOUTH EVERY DAY   methocarbamol  500 MG  tablet Commonly known as: ROBAXIN  Take 1 tablet (500 mg total) by mouth every 8 (eight) hours as needed for muscle spasms.   nitroGLYCERIN  0.4 MG SL tablet Commonly known as: NITROSTAT  Place 1 tablet (0.4 mg total) under the tongue every 5 (five) minutes as needed for chest pain.   pantoprazole  40 MG tablet Commonly known as: PROTONIX  TAKE 1 TABLET DAILY   pramipexole  1 MG tablet Commonly known as: MIRAPEX  TAKE 1 TABLET AT BEDTIME   traMADol 50 MG tablet Commonly known as: ULTRAM Take 1-2 tablets (50-100 mg total) by mouth every 6 (six) hours as needed for moderate pain (pain score 4-6).               Discharge Care Instructions  (From admission, onward)           Start     Ordered   01/18/24 0000  No dressing needed        01/18/24 3086            Follow-up Information     Oralee Billow, MD. Schedule an appointment as soon as possible for a visit in 3 week(s).   Specialty: General Surgery Why: For wound re-check Contact information: [address] [place] [address] [phone]                 Oralee Billow, MD [facility] Office: [phone]   Signed: Oralee Billow 01/18/2024, 8:33  AM

## 2024-01-18 NOTE — Progress Notes (Signed)
 Pathology results are as expected.  Margins negative, small tumor.  Very favorable.  I will copy to Dr. Tilmon Font.  He will decide whether or not an endocrinology consultation is necessary.  I'll see you at your post op visit in a few weeks.  Oralee Billow, MD [facility] practice Office: [phone]

## 2024-01-28 ENCOUNTER — Other Ambulatory Visit: Payer: Self-pay

## 2024-01-28 DIAGNOSIS — E7800 Pure hypercholesterolemia, unspecified: Secondary | ICD-10-CM

## 2024-01-28 DIAGNOSIS — I10 Essential (primary) hypertension: Secondary | ICD-10-CM

## 2024-01-28 DIAGNOSIS — I2510 Atherosclerotic heart disease of native coronary artery without angina pectoris: Secondary | ICD-10-CM

## 2024-01-28 MED ORDER — HYDROCHLOROTHIAZIDE 25 MG PO TABS: 25.0000 mg | ORAL_TABLET | Freq: Every day | ORAL | 3 refills | Status: AC

## 2024-02-10 ENCOUNTER — Telehealth: Payer: Self-pay

## 2024-02-10 ENCOUNTER — Telehealth: Payer: Self-pay | Admitting: Cardiology

## 2024-02-10 ENCOUNTER — Other Ambulatory Visit (HOSPITAL_COMMUNITY): Payer: Self-pay

## 2024-02-10 MED ORDER — FENOFIBRATE 145 MG PO TABS: 145.0000 mg | ORAL_TABLET | Freq: Every day | ORAL | 0 refills | Status: AC

## 2024-02-10 NOTE — Telephone Encounter (Signed)
 Pt's medication was sent to pt's pharmacy as requested. Confirmation received.

## 2024-02-10 NOTE — Telephone Encounter (Signed)
 Pharmacy Patient Advocate Encounter   Received notification from CoverMyMeds that prior authorization for Pantoprazole  Sodium 40MG  dr tablets is required/requested.   Insurance verification completed.   The patient is insured through [facility] .   Per test claim: PA required; PA submitted to above mentioned insurance via CoverMyMeds Key/confirmation #/EOC [id number] Status is pending

## 2024-02-10 NOTE — Telephone Encounter (Signed)
*  STAT* If patient is at the pharmacy, call can be transferred to refill team.   1. Which medications need to be refilled? (please list name of each medication and dose if known)   fenofibrate  (TRICOR ) 145 MG tablet   2. Would you like to learn more about the convenience, safety, & potential cost savings by using the [facility]?   3. Are you open to using the [facility] (Type [facility]. ).  4. Which pharmacy/location (including street and city if local pharmacy) is medication to be sent to?  [facility] - [place], [place] - [address] [facility] AT Portal to Registered [facility] Sites   5. Do they need a 30 day or 90 day supply?  90 day  Patient stated he still has some medication.

## 2024-02-14 NOTE — Progress Notes (Unsigned)
    Ben Jackson D.[facility] [address] [place] [address] Phone: [phone]   Assessment and Plan:     There are no diagnoses linked to this encounter.  ***   Pertinent previous records reviewed include ***    Follow Up: ***     Subjective:   I, Celsey Asselin, am serving as a Neurosurgeon for Doctor Ulysees Gander   Chief Complaint: neck pain    HPI:    12/16/23 Patient is a 88 year old male with neck pain. Patient states decreased ROM  due to pain. Pain started 6-8 weeks ago.no MOI. No radiating pain but pain is causing headaches. Robaxin  helps a little. Will have thyroid  surgery next month.   02/15/2024 Patient states   Relevant Historical Information: Prediabetes, history of prostate cancer, CKD stage IIIa, thyromegaly, ulcerative colitis  Additional pertinent review of systems negative.   Current Outpatient Medications:    amLODipine  (NORVASC ) 10 MG tablet, TAKE 1 TABLET BY MOUTH EVERY DAY, Disp: 90 tablet, Rfl: 3   atorvastatin  (LIPITOR) 40 MG tablet, TAKE 1 TABLET DAILY, Disp: 90 tablet, Rfl: 1   carvedilol  (COREG ) 12.5 MG tablet, TAKE 1 TABLET DAILY, Disp: 90 tablet, Rfl: 2   clopidogrel  (PLAVIX ) 75 MG tablet, TAKE 1 TABLET DAILY, Disp: 90 tablet, Rfl: 1   dapagliflozin  propanediol (FARXIGA ) 10 MG TABS tablet, Take 1 tablet (10 mg total) by mouth daily before breakfast., Disp: 90 tablet, Rfl: 1   fenofibrate  (TRICOR ) 145 MG tablet, Take 1 tablet (145 mg total) by mouth daily., Disp: 90 tablet, Rfl: 0   hydrochlorothiazide  (HYDRODIURIL ) 25 MG tablet, Take 1 tablet (25 mg total) by mouth daily., Disp: 90 tablet, Rfl: 3   isosorbide  mononitrate (IMDUR ) 60 MG 24 hr tablet, TAKE 1 TABLET BY MOUTH EVERY DAY, Disp: 90 tablet, Rfl: 1   methocarbamol  (ROBAXIN ) 500 MG tablet, Take 1 tablet (500 mg total) by mouth every 8 (eight) hours as needed for muscle spasms. (Patient not taking: Reported on 01/04/2024), Disp: 30 tablet, Rfl: 0    nitroGLYCERIN  (NITROSTAT ) 0.4 MG SL tablet, Place 1 tablet (0.4 mg total) under the tongue every 5 (five) minutes as needed for chest pain., Disp: 25 tablet, Rfl: 6   pantoprazole  (PROTONIX ) 40 MG tablet, TAKE 1 TABLET DAILY, Disp: 90 tablet, Rfl: 1   pramipexole  (MIRAPEX ) 1 MG tablet, TAKE 1 TABLET AT BEDTIME, Disp: 90 tablet, Rfl: 1   traMADol  (ULTRAM ) 50 MG tablet, Take 1-2 tablets (50-100 mg total) by mouth every 6 (six) hours as needed for moderate pain (pain score 4-6)., Disp: 12 tablet, Rfl: 0  Current Facility-Administered Medications:    ipratropium-albuterol  (DUONEB) 0.5-2.5 (3) MG/3ML nebulizer solution 3 mL, 3 mL, Nebulization, Once, Nafziger, Randel Buss, NP   Objective:     There were no vitals filed for this visit.    There is no height or weight on file to calculate BMI.    Physical Exam:    ***   Electronically signed by:  Marshall Skeeter D.[facility] 7:33 AM 02/14/24

## 2024-02-15 ENCOUNTER — Ambulatory Visit: Admitting: Sports Medicine

## 2024-02-15 ENCOUNTER — Other Ambulatory Visit (HOSPITAL_COMMUNITY): Payer: Self-pay

## 2024-02-15 VITALS — BP 120/84 | Ht 75.0 in | Wt 215.0 lb

## 2024-02-15 DIAGNOSIS — M542 Cervicalgia: Secondary | ICD-10-CM

## 2024-02-15 DIAGNOSIS — M5030 Other cervical disc degeneration, unspecified cervical region: Secondary | ICD-10-CM

## 2024-02-15 NOTE — Telephone Encounter (Signed)
 Pharmacy Patient Advocate Encounter  Received notification from [facility] that Prior Authorization for Pantoprazole  Sodium 40MG  dr tablets has been APPROVED from 01/11/2024 to 02/09/2025. Unable to obtain price due to refill too soon rejection, last fill date 01/06/2024 next available fill date 03/14/2024   PA #/Case ID/Reference #: [id number]

## 2024-02-17 ENCOUNTER — Encounter: Payer: Self-pay | Admitting: Podiatry

## 2024-02-17 ENCOUNTER — Ambulatory Visit (INDEPENDENT_AMBULATORY_CARE_PROVIDER_SITE_OTHER)

## 2024-02-17 ENCOUNTER — Ambulatory Visit (INDEPENDENT_AMBULATORY_CARE_PROVIDER_SITE_OTHER): Admitting: Podiatry

## 2024-02-17 DIAGNOSIS — L97521 Non-pressure chronic ulcer of other part of left foot limited to breakdown of skin: Secondary | ICD-10-CM

## 2024-02-17 DIAGNOSIS — R0989 Other specified symptoms and signs involving the circulatory and respiratory systems: Secondary | ICD-10-CM

## 2024-02-17 MED ORDER — SULFAMETHOXAZOLE-TRIMETHOPRIM 800-160 MG PO TABS: 1.0000 | ORAL_TABLET | Freq: Two times a day (BID) | ORAL | 1 refills | Status: DC

## 2024-02-17 MED ORDER — MUPIROCIN 2 % EX OINT: 1.0000 | TOPICAL_OINTMENT | Freq: Two times a day (BID) | CUTANEOUS | 0 refills | Status: DC

## 2024-02-17 NOTE — Progress Notes (Signed)
 Kristopher Wilson presents today for a chief complaint of a ulceration to the plantar aspect of his foot left.  He states has been painful there for about 3 days he is try peroxide to clean it has been walking more for exercise thinking that this may be the reason for this.  He denies fever chills nausea vomiting muscle aches pains calf pain back pain chest pain shortness of breath.  Objective: Pulses are barely palpable.  Capillary fill time is sluggish compared to previous evaluation.  0.4 cm lesion just proximal to the plantar aspect of the fifth metatarsal head.  There is no purulence no malodor radiographs do not demonstrate any type of osseous breakdown in this area.  No gas is noted.  Assessment: Peripheral vascular disease possible with breakdown of the skin beneath the fifth metatarsal left foot.  Plan: Discussed etiology pathology conservative surgical therapies at this point we are going to request arterial studies as well as starting him on Augmentin  for the next 2 weeks as well as Bactroban ointment.  I demonstrated to him how to dress the area today with a small amount of Betadine solution padding and a dry sterile dressing.  He will continue to do this for the next 2 weeks.  We will request the ABIs immediately.

## 2024-02-18 ENCOUNTER — Encounter: Payer: Self-pay | Admitting: Podiatry

## 2024-03-02 ENCOUNTER — Encounter: Payer: Self-pay | Admitting: Podiatry

## 2024-03-02 ENCOUNTER — Ambulatory Visit (INDEPENDENT_AMBULATORY_CARE_PROVIDER_SITE_OTHER): Admitting: Podiatry

## 2024-03-02 DIAGNOSIS — L97521 Non-pressure chronic ulcer of other part of left foot limited to breakdown of skin: Secondary | ICD-10-CM | POA: Diagnosis not present

## 2024-03-03 NOTE — Progress Notes (Signed)
 He presents today for follow-up of ulceration subfifth metatarsal of the left foot.  States is a little better I go for the circulation test next week to his ABIs.  Objective: Vital signs are stable alert and oriented x 3.  Pulses are palpable but barely feet are warm to the touch.  Subfifth metatarsal area of the left foot does demonstrate a healing ulcerative lesion though it does not appear to be granulating in very nicely from the depth.  Currently there is no purulence no malodor no obvious signs of infection.  Assessment: Ulceration subfifth met left.  Peripheral arterial disease.  Plan: He will continue keeping the the lesion dry and clean and I will place a small amount of Bactroban  ointment with a dry dressing.  I would like to have him follow-up with Dr. Silva in the next few days just to make sure that he is doing well and that we are doing all that we could do to help with this ulceration.

## 2024-03-16 ENCOUNTER — Ambulatory Visit (HOSPITAL_COMMUNITY)
Admission: RE | Admit: 2024-03-16 | Discharge: 2024-03-16 | Disposition: A | Discharge status: Home / Self Care | Source: Ambulatory Visit | Attending: Podiatry | Admitting: Podiatry

## 2024-03-16 DIAGNOSIS — L97521 Non-pressure chronic ulcer of other part of left foot limited to breakdown of skin: Secondary | ICD-10-CM | POA: Diagnosis present

## 2024-03-16 DIAGNOSIS — R0989 Other specified symptoms and signs involving the circulatory and respiratory systems: Secondary | ICD-10-CM | POA: Insufficient documentation

## 2024-03-17 LAB — VAS US ABI WITH/WO TBI
Left ABI: 1.14
Right ABI: 1.09

## 2024-03-21 ENCOUNTER — Ambulatory Visit (INDEPENDENT_AMBULATORY_CARE_PROVIDER_SITE_OTHER): Admitting: Podiatry

## 2024-03-21 VITALS — Ht 75.0 in | Wt 215.0 lb

## 2024-03-21 DIAGNOSIS — L97521 Non-pressure chronic ulcer of other part of left foot limited to breakdown of skin: Secondary | ICD-10-CM

## 2024-03-21 NOTE — Progress Notes (Signed)
  Subjective:  Patient ID: Kristopher Wilson, male    DOB: 03-Sep-1936,  MRN: [id number]  Chief Complaint  Patient presents with   Foot Ulcer    Rm 2 Patient is here to follow-up on ulcer on the left foot. Wound is partially expose, no foul odor or drainage. Patient states changing the bandages twice daily, cleaning affected area with hydrogen peroxide and applying a prescribed cream to the site.    88 y.o. male presents with the above complaint. History confirmed with patient.  They have been using mupirocin  ointment at home  Objective:  Physical Exam: warm, good capillary refill, no trophic changes or ulcerative lesions, normal sensory exam, ulceration at submetatarsal 5 left measures 0.6 x 0.5 x 0.2 cm with surrounding hyperkeratosis fibrogranular wound bed appears to be doing well, and reduced peripheral pulses.     Assessment:   1. Ulcer of left foot, limited to breakdown of skin [facility])      Plan:  Patient was evaluated and treated and all questions answered.  Ulcer left foot -We discussed the etiology and factors that are a part of the wound healing process.   -Debridement as below. -Dressed with Prisma collagen, DSD. -Continue home dressing changes 2-3 times weekly with Prisma collagen and adhesive bandage or gauze -Continue off-loading with surgical shoe and peg assist device.  These were dispensed today -Vascular testing ABIs were normal   Procedure: Excisional Debridement of Wound Rationale: Removal of non-viable soft tissue from the wound to promote healing.  Anesthesia: none Post-Debridement Wound Measurements: Noted above Type of Debridement: Sharp Excisional Tissue Removed: Non-viable soft tissue Depth of Debridement: subcutaneous tissue. Technique: Sharp excisional debridement to bleeding, viable wound base.  Dressing: Dry, sterile, compression dressing. Disposition: Patient tolerated procedure well.     Return in about 3 weeks (around 04/11/2024) for wound  care.

## 2024-03-23 ENCOUNTER — Ambulatory Visit: Payer: Self-pay | Admitting: Podiatry

## 2024-03-24 ENCOUNTER — Ambulatory Visit: Payer: Self-pay

## 2024-03-24 NOTE — Telephone Encounter (Signed)
 FYI Only or Action Required?: FYI only for provider.  Patient was last seen in primary care on 12/13/2023 by Berneta Elsie Sayre, MD.  Called Nurse Triage reporting Headache.  Symptoms began several weeks ago.  Interventions attempted: Nothing.  Symptoms are: unchanged.  Triage Disposition: No disposition on file.  Patient/caregiver understands and will follow disposition?:         Copied from CRM [id number]. Topic: Clinical - Red Word Triage >> Mar 24, 2024  8:15 AM [place] E wrote: [facility] that prompted transfer to Nurse Triage: Persistent headache going on for a couple weeks along with body aches for 3 days, hurts to move anything. Reason for Disposition  [1] MODERATE headache (e.g., interferes with normal activities) AND [2] present > 24 hours AND [3] unexplained  (Exceptions: Pain medicines not tried, typical migraine, or headache part of viral illness.)  Answer Assessment - Initial Assessment Questions 1. LOCATION: Where does it hurt?     Right side of head and to the back 2. ONSET: When did the headache start? (e.g., minutes, hours, days)      Couple weeks 3. PATTERN: Does the pain come and go, or has it been constant since it started?     Comes and goes 4. SEVERITY: How bad is the pain? and What does it keep you from doing?  (e.g., Scale 1-10; mild, moderate, or severe)     moderate 5. RECURRENT SYMPTOM: Have you ever had headaches before? If Yes, ask: When was the last time? and What happened that time?      no 6. CAUSE: What do you think is causing the headache?     no 7. MIGRAINE: Have you been diagnosed with migraine headaches? If Yes, ask: Is this headache similar?      no 8. HEAD INJURY: Has there been any recent injury to your head?      no 9. OTHER SYMPTOMS: Do you have any other symptoms? (e.g., fever, stiff neck, eye pain, sore throat, cold symptoms)     Body aches 10. PREGNANCY: Is there any chance you are pregnant? When  was your last menstrual period?       na  Protocols used: Headache-A-AH

## 2024-03-27 ENCOUNTER — Encounter: Payer: Self-pay | Admitting: Podiatry

## 2024-03-27 ENCOUNTER — Ambulatory Visit: Admitting: Family Medicine

## 2024-03-27 ENCOUNTER — Ambulatory Visit: Payer: Self-pay | Admitting: Family Medicine

## 2024-03-27 ENCOUNTER — Encounter: Payer: Self-pay | Admitting: Family Medicine

## 2024-03-27 VITALS — BP 122/64 | HR 68 | Temp 98.9°F | Ht 75.0 in | Wt 217.4 lb

## 2024-03-27 DIAGNOSIS — R519 Headache, unspecified: Secondary | ICD-10-CM

## 2024-03-27 DIAGNOSIS — G8929 Other chronic pain: Secondary | ICD-10-CM | POA: Diagnosis not present

## 2024-03-27 DIAGNOSIS — M791 Myalgia, unspecified site: Secondary | ICD-10-CM | POA: Diagnosis not present

## 2024-03-27 LAB — CBC WITH DIFFERENTIAL/PLATELET
Basophils Absolute: 0 K/uL (ref 0.0–0.1)
Basophils Relative: 0.5 % (ref 0.0–3.0)
Eosinophils Absolute: 0.2 K/uL (ref 0.0–0.7)
Eosinophils Relative: 3 % (ref 0.0–5.0)
HCT: 41.4 % (ref 39.0–52.0)
Hemoglobin: 13.6 g/dL (ref 13.0–17.0)
Lymphocytes Relative: 21.5 % (ref 12.0–46.0)
Lymphs Abs: 1.6 K/uL (ref 0.7–4.0)
MCHC: 33 g/dL (ref 30.0–36.0)
MCV: 83.2 fl (ref 78.0–100.0)
Monocytes Absolute: 0.6 K/uL (ref 0.1–1.0)
Monocytes Relative: 7.6 % (ref 3.0–12.0)
Neutro Abs: 5.1 K/uL (ref 1.4–7.7)
Neutrophils Relative %: 67.4 % (ref 43.0–77.0)
Platelets: 300 K/uL (ref 150.0–400.0)
RBC: 4.97 Mil/uL (ref 4.22–5.81)
RDW: 14.6 % (ref 11.5–15.5)
WBC: 7.6 K/uL (ref 4.0–10.5)

## 2024-03-27 LAB — BASIC METABOLIC PANEL WITH GFR
BUN: 26 mg/dL — ABNORMAL HIGH (ref 6–23)
CO2: 29 meq/L (ref 19–32)
Calcium: 9.7 mg/dL (ref 8.4–10.5)
Chloride: 99 meq/L (ref 96–112)
Creatinine, Ser: 1.25 mg/dL (ref 0.40–1.50)
GFR: 52.38 mL/min — ABNORMAL LOW (ref >60.00)
Glucose, Bld: 105 mg/dL — ABNORMAL HIGH (ref 70–99)
Potassium: 3.7 meq/L (ref 3.5–5.1)
Sodium: 137 meq/L (ref 135–145)

## 2024-03-27 NOTE — Progress Notes (Signed)
 Spoke to patient informed of normal result.

## 2024-03-27 NOTE — Progress Notes (Addendum)
 Established Patient Office Visit   Subjective:  Patient ID: Kristopher Wilson, male    DOB: Nov 24, 1935  Age: 88 y.o. MRN: [id number]  Chief Complaint  Patient presents with   Headache    Started 5 weeks ago    Generalized Body Aches    Started 3-4 days ago     Headache  Pertinent negatives include no abdominal pain, blurred vision, eye redness, fever, tingling, weakness or weight loss.   Encounter Diagnoses  Name Primary?   Chronic nonintractable headache, unspecified headache type Yes   Myalgia    Presents with a 5-week history of a right occipital headache that sometimes moves towards the front of his head behind his right eye.  Headache is daily and seems to last throughout the day.  He keeps it at bay with Tylenol .  Headache is nonprogressive.  There is been no weight loss, night sweats fever or chills.  He has also been experiencing arthralgias and myalgias that involve his knees elbows shoulders and upper back.   Review of Systems  Constitutional: Negative.  Negative for chills, diaphoresis, fever, malaise/fatigue and weight loss.  HENT: Negative.    Eyes:  Negative for blurred vision, discharge and redness.  Respiratory: Negative.    Cardiovascular: Negative.   Gastrointestinal:  Negative for abdominal pain.  Genitourinary: Negative.   Musculoskeletal:  Positive for joint pain and myalgias.  Skin:  Negative for rash.  Neurological:  Positive for headaches. Negative for tingling, loss of consciousness and weakness.  Endo/Heme/Allergies:  Negative for polydipsia.     Current Outpatient Medications:    amLODipine  (NORVASC ) 10 MG tablet, TAKE 1 TABLET BY MOUTH EVERY DAY, Disp: 90 tablet, Rfl: 3   atorvastatin  (LIPITOR) 40 MG tablet, TAKE 1 TABLET DAILY, Disp: 90 tablet, Rfl: 1   carvedilol  (COREG ) 12.5 MG tablet, TAKE 1 TABLET DAILY, Disp: 90 tablet, Rfl: 2   clopidogrel  (PLAVIX ) 75 MG tablet, TAKE 1 TABLET DAILY, Disp: 90 tablet, Rfl: 1   dapagliflozin  propanediol  (FARXIGA ) 10 MG TABS tablet, Take 1 tablet (10 mg total) by mouth daily before breakfast., Disp: 90 tablet, Rfl: 1   fenofibrate  (TRICOR ) 145 MG tablet, Take 1 tablet (145 mg total) by mouth daily., Disp: 90 tablet, Rfl: 0   hydrochlorothiazide  (HYDRODIURIL ) 25 MG tablet, Take 1 tablet (25 mg total) by mouth daily., Disp: 90 tablet, Rfl: 3   isosorbide  mononitrate (IMDUR ) 60 MG 24 hr tablet, TAKE 1 TABLET BY MOUTH EVERY DAY, Disp: 90 tablet, Rfl: 1   methocarbamol  (ROBAXIN ) 500 MG tablet, Take 1 tablet (500 mg total) by mouth every 8 (eight) hours as needed for muscle spasms., Disp: 30 tablet, Rfl: 0   mupirocin  ointment (BACTROBAN ) 2 %, Apply 1 Application topically 2 (two) times daily., Disp: 22 g, Rfl: 0   nitroGLYCERIN  (NITROSTAT ) 0.4 MG SL tablet, Place 1 tablet (0.4 mg total) under the tongue every 5 (five) minutes as needed for chest pain., Disp: 25 tablet, Rfl: 6   pantoprazole  (PROTONIX ) 40 MG tablet, TAKE 1 TABLET DAILY, Disp: 90 tablet, Rfl: 1   pramipexole  (MIRAPEX ) 1 MG tablet, TAKE 1 TABLET AT BEDTIME, Disp: 90 tablet, Rfl: 1   predniSONE  (DELTASONE ) 10 MG tablet, Take 1 tablet (10 mg total) by mouth daily with breakfast for 7 days., Disp: 7 tablet, Rfl: 0   sulfamethoxazole -trimethoprim  (BACTRIM  DS) 800-160 MG tablet, Take 1 tablet by mouth 2 (two) times daily., Disp: 20 tablet, Rfl: 1   traMADol  (ULTRAM ) 50 MG tablet, Take 1-2 tablets (  50-100 mg total) by mouth every 6 (six) hours as needed for moderate pain (pain score 4-6)., Disp: 12 tablet, Rfl: 0  Current Facility-Administered Medications:    ipratropium-albuterol  (DUONEB) 0.5-2.5 (3) MG/3ML nebulizer solution 3 mL, 3 mL, Nebulization, Once, Nafziger, Cory, NP   Objective:     BP 122/64   Pulse 68   Temp 98.9 F (37.2 C) (Temporal)   Ht 6' 3 (1.905 m)   Wt 217 lb 6.4 oz (98.6 kg)   SpO2 96%   BMI 27.17 kg/m  BP Readings from Last 3 Encounters:  03/27/24 122/64  02/15/24 120/84  01/18/24 137/76   Wt Readings from  Last 3 Encounters:  03/27/24 217 lb 6.4 oz (98.6 kg)  03/21/24 215 lb (97.5 kg)  02/15/24 215 lb (97.5 kg)      Physical Exam Constitutional:      General: He is not in acute distress.    Appearance: Normal appearance. He is not ill-appearing, toxic-appearing or diaphoretic.  HENT:     Head: Normocephalic and atraumatic.     Right Ear: External ear normal.     Left Ear: External ear normal.     Mouth/Throat:     Mouth: Mucous membranes are moist.     Pharynx: Oropharynx is clear. No oropharyngeal exudate or posterior oropharyngeal erythema.  Eyes:     General: No visual field deficit or scleral icterus.       Right eye: No discharge.        Left eye: No discharge.     Extraocular Movements: Extraocular movements intact.     Conjunctiva/sclera: Conjunctivae normal.     Pupils: Pupils are equal, round, and reactive to light. Pupils are equal.  Cardiovascular:     Rate and Rhythm: Normal rate and regular rhythm.  Pulmonary:     Effort: Pulmonary effort is normal. No respiratory distress.     Breath sounds: Normal breath sounds.  Abdominal:     General: Bowel sounds are normal.  Musculoskeletal:     Cervical back: Normal range of motion. No rigidity or tenderness.  Lymphadenopathy:     Cervical: No cervical adenopathy.  Skin:    General: Skin is warm and dry.     Findings: No rash.  Neurological:     Mental Status: He is alert and oriented to person, place, and time.     Cranial Nerves: No cranial nerve deficit, dysarthria or facial asymmetry.  Psychiatric:        Mood and Affect: Mood normal.        Behavior: Behavior normal.      Results for orders placed or performed in visit on 03/27/24  Tick-borne Disease,Antibody Panel   Specimen: Blood  Result Value Ref Range   A. phagocytophila IgG Abs <1:64    A. phagocytophila IgM Abs <1:20    Interpretation     Babesia Duncani(WA1)Antibody(IgG),IFA <1:256    Babesia microti IgG <1:64 titer   Babesia microti IgM <1:20  titer   Interpretation     B burgdorferi Ab IgG+IgM <0.90 INDEX   E. CHAFFEENSIS AB IGG <1:64    E. CHAFFEENSIS AB IGM <1:20    INTERPRETATION    CBC with Differential/Platelet  Result Value Ref Range   WBC 7.6 4.0 - 10.5 K/uL   RBC 4.97 4.22 - 5.81 Mil/uL   Hemoglobin 13.6 13.0 - 17.0 g/dL   HCT 58.5 60.9 - 47.9 %   MCV 83.2 78.0 - 100.0 fl   MCHC 33.0 30.0 -  36.0 g/dL   RDW 85.3 88.4 - 84.4 %   Platelets 300.0 150.0 - 400.0 K/uL   Neutrophils Relative % 67.4 43.0 - 77.0 %   Lymphocytes Relative 21.5 12.0 - 46.0 %   Monocytes Relative 7.6 3.0 - 12.0 %   Eosinophils Relative 3.0 0.0 - 5.0 %   Basophils Relative 0.5 0.0 - 3.0 %   Neutro Abs 5.1 1.4 - 7.7 K/uL   Lymphs Abs 1.6 0.7 - 4.0 K/uL   Monocytes Absolute 0.6 0.1 - 1.0 K/uL   Eosinophils Absolute 0.2 0.0 - 0.7 K/uL   Basophils Absolute 0.0 0.0 - 0.1 K/uL  Basic metabolic panel with GFR  Result Value Ref Range   Sodium 137 135 - 145 mEq/L   Potassium 3.7 3.5 - 5.1 mEq/L   Chloride 99 96 - 112 mEq/L   CO2 29 19 - 32 mEq/L   Glucose, Bld 105 (H) 70 - 99 mg/dL   BUN 26 (H) 6 - 23 mg/dL   Creatinine, Ser 8.74 0.40 - 1.50 mg/dL   GFR 47.61 (L) >39.99 mL/min   Calcium  9.7 8.4 - 10.5 mg/dL      The ASCVD Risk score (Arnett DK, et al., 2019) failed to calculate for the following reasons:   The 2019 ASCVD risk score is only valid for ages 52 to 44    Assessment & Plan:   Chronic nonintractable headache, unspecified headache type -     CBC with Differential/Platelet -     Basic metabolic panel with GFR -     Tick-borne Disease,Antibody Panel -     CT HEAD W CONTRAST ( ); Future -     CT HEAD WO CONTRAST ( ); Future  Myalgia -     predniSONE ; Take 1 tablet (10 mg total) by mouth daily with breakfast for 7 days.  Dispense: 7 tablet; Refill: 0    Return in about 4 weeks (around 04/24/2024), or if symptoms worsen or fail to improve.    Elsie Sim Lent, MD

## 2024-04-01 LAB — TICK-BORNE,AB PANEL
A. phagocytophila IgG Abs: <1:64 {titer}
A. phagocytophila IgM Abs: <1:20 {titer}
B burgdorferi Ab IgG+IgM: <0.9 {index}
Babesia Duncani(WA1)Antibody(IgG),IFA: <1:256 {titer}
Babesia microti IgG: <1:64 {titer}
Babesia microti IgM: <1:20 {titer}
E. CHAFFEENSIS AB IGG: <1:64 {titer}
E. CHAFFEENSIS AB IGM: <1:20 {titer}

## 2024-04-02 ENCOUNTER — Encounter: Payer: Self-pay | Admitting: Family Medicine

## 2024-04-03 MED ORDER — PREDNISONE 10 MG PO TABS: 10.0000 mg | ORAL_TABLET | Freq: Every day | ORAL | 0 refills | Status: AC

## 2024-04-03 NOTE — Addendum Note (Signed)
 Addended by: BERNETA ELSIE LABOR on: 04/03/2024 11:57 AM   Modules accepted: Orders

## 2024-04-04 ENCOUNTER — Ambulatory Visit (INDEPENDENT_AMBULATORY_CARE_PROVIDER_SITE_OTHER): Admitting: Podiatry

## 2024-04-04 VITALS — Ht 75.0 in | Wt 217.4 lb

## 2024-04-04 DIAGNOSIS — L97521 Non-pressure chronic ulcer of other part of left foot limited to breakdown of skin: Secondary | ICD-10-CM

## 2024-04-04 NOTE — Progress Notes (Signed)
  Subjective:  Patient ID: Kristopher Wilson, male    DOB: 20-Jan-1936,  MRN: [id number]  Chief Complaint  Patient presents with   Foot Ulcer    RM 3 Patient Is here for a f/u for an ulcer of the left foot. Wound is open with no significant drainage. Patient states some minor pain and discomfort when pressure is applied.    88 y.o. male presents with the above complaint. History confirmed with patient.  They have been using Prisma collagen at home  Objective:  Physical Exam: warm, good capillary refill, no trophic changes or ulcerative lesions, normal sensory exam, ulceration at submetatarsal 5 left measures 0.6 x 0.3 x 0.3 cm with surrounding hyperkeratosis fibrogranular wound bed appears to be doing well, and reduced peripheral pulses.     Assessment:   1. Ulcer of left foot, limited to breakdown of skin [facility])       Plan:  Patient was evaluated and treated and all questions answered.  Ulcer left foot -We discussed the etiology and factors that are a part of the wound healing process.   -Debridement as below. -Dressed with Prisma collagen, DSD. -Continue home dressing changes 2-3 times weekly with Prisma collagen and adhesive bandage or gauze -Continue off-loading with surgical shoe and peg assist device.     Procedure: Excisional Debridement of Wound Rationale: Removal of non-viable soft tissue from the wound to promote healing.  Anesthesia: none Post-Debridement Wound Measurements: Noted above Type of Debridement: Sharp Excisional Tissue Removed: Non-viable soft tissue Depth of Debridement: subcutaneous tissue. Technique: Sharp excisional debridement to bleeding, viable wound base.  Dressing: Dry, sterile, compression dressing. Disposition: Patient tolerated procedure well.     Return in about 3 weeks (around 04/25/2024) for wound care.

## 2024-04-05 ENCOUNTER — Encounter (HOSPITAL_BASED_OUTPATIENT_CLINIC_OR_DEPARTMENT_OTHER): Payer: Self-pay

## 2024-04-05 ENCOUNTER — Other Ambulatory Visit: Payer: Self-pay | Admitting: Family Medicine

## 2024-04-05 ENCOUNTER — Ambulatory Visit (HOSPITAL_BASED_OUTPATIENT_CLINIC_OR_DEPARTMENT_OTHER)
Admission: RE | Admit: 2024-04-05 | Discharge: 2024-04-05 | Disposition: A | Discharge status: Home / Self Care | Source: Ambulatory Visit | Attending: Family Medicine | Admitting: Family Medicine

## 2024-04-05 DIAGNOSIS — G8929 Other chronic pain: Secondary | ICD-10-CM | POA: Insufficient documentation

## 2024-04-05 DIAGNOSIS — R519 Headache, unspecified: Secondary | ICD-10-CM

## 2024-04-05 DIAGNOSIS — M791 Myalgia, unspecified site: Secondary | ICD-10-CM

## 2024-04-05 MED ORDER — IOHEXOL 300 MG/ML  SOLN
100.0000 mL | Freq: Once | INTRAMUSCULAR | Status: AC | PRN
  Administered 2024-04-05: 75 mL via INTRAVENOUS

## 2024-04-06 ENCOUNTER — Ambulatory Visit: Payer: Self-pay | Admitting: Family Medicine

## 2024-04-25 ENCOUNTER — Encounter: Payer: Self-pay | Admitting: Family Medicine

## 2024-04-25 ENCOUNTER — Ambulatory Visit: Admitting: Family Medicine

## 2024-04-25 VITALS — BP 112/64 | HR 89 | Temp 96.9°F | Ht 75.0 in | Wt 217.4 lb

## 2024-04-25 DIAGNOSIS — E7800 Pure hypercholesterolemia, unspecified: Secondary | ICD-10-CM

## 2024-04-25 DIAGNOSIS — Z8546 Personal history of malignant neoplasm of prostate: Secondary | ICD-10-CM

## 2024-04-25 DIAGNOSIS — M25511 Pain in right shoulder: Secondary | ICD-10-CM | POA: Diagnosis not present

## 2024-04-25 DIAGNOSIS — M79651 Pain in right thigh: Secondary | ICD-10-CM

## 2024-04-25 DIAGNOSIS — Z85850 Personal history of malignant neoplasm of thyroid: Secondary | ICD-10-CM | POA: Diagnosis not present

## 2024-04-25 DIAGNOSIS — M25512 Pain in left shoulder: Secondary | ICD-10-CM | POA: Diagnosis not present

## 2024-04-25 DIAGNOSIS — E559 Vitamin D deficiency, unspecified: Secondary | ICD-10-CM

## 2024-04-25 DIAGNOSIS — R7303 Prediabetes: Secondary | ICD-10-CM | POA: Diagnosis not present

## 2024-04-25 DIAGNOSIS — M79652 Pain in left thigh: Secondary | ICD-10-CM | POA: Diagnosis not present

## 2024-04-25 DIAGNOSIS — N1831 Chronic kidney disease, stage 3a: Secondary | ICD-10-CM | POA: Diagnosis not present

## 2024-04-25 DIAGNOSIS — E538 Deficiency of other specified B group vitamins: Secondary | ICD-10-CM | POA: Diagnosis not present

## 2024-04-25 LAB — URINALYSIS, ROUTINE W REFLEX MICROSCOPIC
Bilirubin Urine: NEGATIVE
Hgb urine dipstick: NEGATIVE
Ketones, ur: NEGATIVE
Leukocytes,Ua: NEGATIVE
Nitrite: NEGATIVE
RBC / HPF: NONE SEEN (ref 0–?)
Specific Gravity, Urine: 1.02 (ref 1.000–1.030)
Total Protein, Urine: NEGATIVE
Urine Glucose: >=1000 — AB
Urobilinogen, UA: 0.2 (ref 0.0–1.0)
pH: 5.5 (ref 5.0–8.0)

## 2024-04-25 LAB — COMPREHENSIVE METABOLIC PANEL WITH GFR
ALT: 17 U/L (ref 0–53)
AST: 17 U/L (ref 0–37)
Albumin: 4 g/dL (ref 3.5–5.2)
Alkaline Phosphatase: 89 U/L (ref 39–117)
BUN: 22 mg/dL (ref 6–23)
CO2: 30 meq/L (ref 19–32)
Calcium: 9.3 mg/dL (ref 8.4–10.5)
Chloride: 99 meq/L (ref 96–112)
Creatinine, Ser: 1.14 mg/dL (ref 0.40–1.50)
GFR: 58.47 mL/min — ABNORMAL LOW (ref >60.00)
Glucose, Bld: 103 mg/dL — ABNORMAL HIGH (ref 70–99)
Potassium: 3.8 meq/L (ref 3.5–5.1)
Sodium: 137 meq/L (ref 135–145)
Total Bilirubin: 0.3 mg/dL (ref 0.2–1.2)
Total Protein: 6.3 g/dL (ref 6.0–8.3)

## 2024-04-25 LAB — CBC WITH DIFFERENTIAL/PLATELET
Basophils Absolute: 0 K/uL (ref 0.0–0.1)
Basophils Relative: 0.3 % (ref 0.0–3.0)
Eosinophils Absolute: 0.2 K/uL (ref 0.0–0.7)
Eosinophils Relative: 2.7 % (ref 0.0–5.0)
HCT: 38.9 % — ABNORMAL LOW (ref 39.0–52.0)
Hemoglobin: 12.9 g/dL — ABNORMAL LOW (ref 13.0–17.0)
Lymphocytes Relative: 28.7 % (ref 12.0–46.0)
Lymphs Abs: 1.8 K/uL (ref 0.7–4.0)
MCHC: 33.1 g/dL (ref 30.0–36.0)
MCV: 81.4 fl (ref 78.0–100.0)
Monocytes Absolute: 0.6 K/uL (ref 0.1–1.0)
Monocytes Relative: 9.2 % (ref 3.0–12.0)
Neutro Abs: 3.8 K/uL (ref 1.4–7.7)
Neutrophils Relative %: 59.1 % (ref 43.0–77.0)
Platelets: 344 K/uL (ref 150.0–400.0)
RBC: 4.78 Mil/uL (ref 4.22–5.81)
RDW: 14.4 % (ref 11.5–15.5)
WBC: 6.4 K/uL (ref 4.0–10.5)

## 2024-04-25 LAB — PSA: PSA: 0.04 ng/mL — ABNORMAL LOW (ref 0.10–4.00)

## 2024-04-25 LAB — HEMOGLOBIN A1C: Hgb A1c MFr Bld: 6.7 % — ABNORMAL HIGH (ref 4.6–6.5)

## 2024-04-25 LAB — LDL CHOLESTEROL, DIRECT: Direct LDL: 59 mg/dL

## 2024-04-25 LAB — SEDIMENTATION RATE: Sed Rate: 18 mm/h (ref 0–20)

## 2024-04-25 LAB — VITAMIN B12: Vitamin B-12: 270 pg/mL (ref 211–911)

## 2024-04-25 LAB — C-REACTIVE PROTEIN: CRP: <1 mg/dL (ref 0.5–20.0)

## 2024-04-25 MED ORDER — PREDNISONE 10 MG PO TABS: 10.0000 mg | ORAL_TABLET | Freq: Two times a day (BID) | ORAL | 0 refills | Status: AC

## 2024-04-25 NOTE — Progress Notes (Signed)
 Established Patient Office Visit   Subjective:  Patient ID: Kristopher Wilson, male    DOB: 14-Jul-1936  Age: 88 y.o. MRN: [id number]  Chief Complaint  Patient presents with   Follow-up    4 week follow up for body aches. Pt states improvement but still has some body aches.     HPI Encounter Diagnoses  Name Primary?   Pre-diabetes Yes   Elevated LDL cholesterol level    Stage 3a chronic kidney disease (HCC)    History of thyroid  cancer    B12 deficiency    History of prostate cancer    Acute pain of both shoulders    Bilateral thigh pain    For follow-up of above and a 4 to 6-week history of newer onset bilateral shoulder and neck pain.  He is having difficulty raising his arms above his shoulder level.  He also is having some problems getting up out of the chair.  History of osteoarthritis involving multiple joints.  Recent negative tick panel.  Needs follow-up blood work for partial thyroidectomy for PTC.   Review of Systems  Constitutional: Negative.  Negative for diaphoresis and weight loss.  HENT: Negative.    Eyes:  Negative for blurred vision, discharge and redness.  Respiratory: Negative.    Cardiovascular: Negative.   Gastrointestinal:  Negative for abdominal pain.  Genitourinary: Negative.   Musculoskeletal:  Positive for joint pain, myalgias and neck pain.  Skin:  Negative for rash.  Neurological:  Negative for tingling, loss of consciousness and weakness.  Endo/Heme/Allergies:  Negative for polydipsia.     Current Outpatient Medications:    amLODipine  (NORVASC ) 10 MG tablet, TAKE 1 TABLET BY MOUTH EVERY DAY, Disp: 90 tablet, Rfl: 3   atorvastatin  (LIPITOR) 40 MG tablet, TAKE 1 TABLET DAILY, Disp: 90 tablet, Rfl: 1   carvedilol  (COREG ) 12.5 MG tablet, TAKE 1 TABLET DAILY, Disp: 90 tablet, Rfl: 2   clopidogrel  (PLAVIX ) 75 MG tablet, TAKE 1 TABLET DAILY, Disp: 90 tablet, Rfl: 1   dapagliflozin  propanediol (FARXIGA ) 10 MG TABS tablet, Take 1 tablet (10 mg total)  by mouth daily before breakfast., Disp: 90 tablet, Rfl: 1   fenofibrate  (TRICOR ) 145 MG tablet, Take 1 tablet (145 mg total) by mouth daily., Disp: 90 tablet, Rfl: 0   hydrochlorothiazide  (HYDRODIURIL ) 25 MG tablet, Take 1 tablet (25 mg total) by mouth daily., Disp: 90 tablet, Rfl: 3   isosorbide  mononitrate (IMDUR ) 60 MG 24 hr tablet, TAKE 1 TABLET BY MOUTH EVERY DAY, Disp: 90 tablet, Rfl: 1   methocarbamol  (ROBAXIN ) 500 MG tablet, Take 1 tablet (500 mg total) by mouth every 8 (eight) hours as needed for muscle spasms., Disp: 30 tablet, Rfl: 0   mupirocin  ointment (BACTROBAN ) 2 %, Apply 1 Application topically 2 (two) times daily., Disp: 22 g, Rfl: 0   nitroGLYCERIN  (NITROSTAT ) 0.4 MG SL tablet, Place 1 tablet (0.4 mg total) under the tongue every 5 (five) minutes as needed for chest pain., Disp: 25 tablet, Rfl: 6   pantoprazole  (PROTONIX ) 40 MG tablet, TAKE 1 TABLET DAILY, Disp: 90 tablet, Rfl: 1   pramipexole  (MIRAPEX ) 1 MG tablet, TAKE 1 TABLET AT BEDTIME, Disp: 90 tablet, Rfl: 1   predniSONE  (DELTASONE ) 10 MG tablet, Take 1 tablet (10 mg total) by mouth 2 (two) times daily with a meal for 10 days., Disp: 20 tablet, Rfl: 0   sulfamethoxazole -trimethoprim  (BACTRIM  DS) 800-160 MG tablet, Take 1 tablet by mouth 2 (two) times daily., Disp: 20 tablet, Rfl: 1  traMADol  (ULTRAM ) 50 MG tablet, Take 1-2 tablets (50-100 mg total) by mouth every 6 (six) hours as needed for moderate pain (pain score 4-6)., Disp: 12 tablet, Rfl: 0  Current Facility-Administered Medications:    ipratropium-albuterol  (DUONEB) 0.5-2.5 (3) MG/3ML nebulizer solution 3 mL, 3 mL, Nebulization, Once, Nafziger, Darleene, NP   Objective:     BP 112/64 (BP Location: Left Arm, Patient Position: Sitting, Cuff Size: Normal)   Pulse 89   Temp (!) 96.9 F (36.1 C) (Temporal)   Ht 6' 3 (1.905 m)   Wt 217 lb 6.4 oz (98.6 kg)   SpO2 96%   BMI 27.17 kg/m  Wt Readings from Last 3 Encounters:  04/25/24 217 lb 6.4 oz (98.6 kg)  04/04/24  217 lb 6.4 oz (98.6 kg)  03/27/24 217 lb 6.4 oz (98.6 kg)      Physical Exam Constitutional:      General: He is not in acute distress.    Appearance: Normal appearance. He is not ill-appearing, toxic-appearing or diaphoretic.  HENT:     Head: Normocephalic and atraumatic.     Right Ear: External ear normal.     Left Ear: External ear normal.  Eyes:     General: No scleral icterus.       Right eye: No discharge.        Left eye: No discharge.     Extraocular Movements: Extraocular movements intact.     Conjunctiva/sclera: Conjunctivae normal.  Pulmonary:     Effort: Pulmonary effort is normal. No respiratory distress.  Skin:    General: Skin is warm and dry.  Neurological:     Mental Status: He is alert and oriented to person, place, and time.  Psychiatric:        Mood and Affect: Mood normal.        Behavior: Behavior normal.      No results found for any visits on 04/25/24.    The ASCVD Risk score (Arnett DK, et al., 2019) failed to calculate for the following reasons:   The 2019 ASCVD risk score is only valid for ages 46 to 43    Assessment & Plan:   Pre-diabetes -     Comprehensive metabolic panel with GFR -     Hemoglobin A1c  Elevated LDL cholesterol level -     Comprehensive metabolic panel with GFR -     LDL cholesterol, direct  Stage 3a chronic kidney disease (HCC) -     Comprehensive metabolic panel with GFR -     Urinalysis, Routine w reflex microscopic  History of thyroid  cancer -     CBC with Differential/Platelet -     TSH+T4F+T3Free+ThyAbs+TPO+VD25  B12 deficiency -     Vitamin B12 -     CBC with Differential/Platelet  History of prostate cancer -     PSA  Acute pain of both shoulders -     predniSONE ; Take 1 tablet (10 mg total) by mouth 2 (two) times daily with a meal for 10 days.  Dispense: 20 tablet; Refill: 0 -     C-reactive protein -     Sedimentation rate  Bilateral thigh pain -     predniSONE ; Take 1 tablet (10 mg total)  by mouth 2 (two) times daily with a meal for 10 days.  Dispense: 20 tablet; Refill: 0 -     C-reactive protein -     Sedimentation rate    Return in about 2 weeks (around 05/09/2024).  Okay to  try a statin holiday for couple of weeks.  And considering PMR.  Information was given on PMR.  Recent follow-up with urology showed a PSA of 0.  Will start prednisone  10 mg twice daily for 2 weeks and follow-up.  Spent over 40 minutes with patient discussing symptoms taking history reviewing the chart and formulating a plan.  Elsie Sim Lent, MD

## 2024-04-26 LAB — TSH+T4F+T3FREE+THYABS+TPO+VD25
Free T4: 1.14 ng/dL (ref 0.82–1.77)
T3, Free: 2.9 pg/mL (ref 2.0–4.4)
TSH: 3.81 u[IU]/mL (ref 0.450–4.500)
Thyroglobulin Antibody: <1 [IU]/mL (ref 0.0–0.9)
Thyroperoxidase Ab SerPl-aCnc: 14 [IU]/mL (ref 0–34)
Vit D, 25-Hydroxy: 20.4 ng/mL — ABNORMAL LOW (ref 30.0–100.0)

## 2024-04-27 ENCOUNTER — Ambulatory Visit: Payer: Self-pay | Admitting: Family Medicine

## 2024-04-27 ENCOUNTER — Ambulatory Visit (INDEPENDENT_AMBULATORY_CARE_PROVIDER_SITE_OTHER): Admitting: Podiatry

## 2024-04-27 VITALS — Ht 75.0 in | Wt 217.4 lb

## 2024-04-27 DIAGNOSIS — L97522 Non-pressure chronic ulcer of other part of left foot with fat layer exposed: Secondary | ICD-10-CM

## 2024-04-27 MED ORDER — VITAMIN B-12 1000 MCG PO TABS: 1000.0000 ug | ORAL_TABLET | Freq: Every day | ORAL | 1 refills | Status: DC

## 2024-04-27 MED ORDER — VITAMIN D (ERGOCALCIFEROL) 1.25 MG (50000 UNIT) PO CAPS: 50000.0000 [IU] | ORAL_CAPSULE | ORAL | 1 refills | Status: DC

## 2024-04-27 NOTE — Addendum Note (Signed)
 Addended by: BERNETA ELSIE LABOR on: 04/27/2024 08:01 AM   Modules accepted: Orders

## 2024-04-27 NOTE — Patient Instructions (Signed)

## 2024-04-27 NOTE — Addendum Note (Signed)
 Addended by: BERNETA ELSIE LABOR on: 04/27/2024 07:58 AM   Modules accepted: Orders

## 2024-04-27 NOTE — Progress Notes (Signed)
  Subjective:  Patient ID: Kristopher Wilson, male    DOB: Feb 05, 1936,  MRN: [id number]  Chief Complaint  Patient presents with   Wound Check    RM 2 Patient is here for left foot ulcer. Ulcer has asmall opening with minimum drainage with no swelling or redness.    88 y.o. male presents with the above complaint. History confirmed with patient.  They have been using Prisma collagen at home  Objective:  Physical Exam: warm, good capillary refill, no trophic changes or ulcerative lesions, normal sensory exam, ulceration at submetatarsal 5 left measures 0.5 x 0.3 x 0.2 cm with surrounding hyperkeratosis fibrogranular wound bed has not changed much, and reduced peripheral pulses.      ABI 03/16/2024 on the left 1.14 TBI 0.92 with a toe pressure of 130  Assessment:   1. Ulcer of left foot with fat layer exposed (HCC)       Plan:  Patient was evaluated and treated and all questions answered.  Ulcer left foot -We discussed the etiology and factors that are a part of the wound healing process.   -Debridement as below. -Dressed with Prisma collagen, DSD. -Continue home dressing changes 2-3 times weekly with Prisma collagen and adhesive bandage or gauze -Continue off-loading with surgical shoe and peg assist device.   -Has made minimal progression so far no about 2 months of local wound care.  I recommended MRI to evaluate for the possibility of deeper collection or osteomyelitis although his lab work was unrevealing.  If this is negative for osteomyelitis we may consider osteotomy of the fifth metatarsal to offload this further due to his plantarflexed metatarsal.   Procedure: Excisional Debridement of Wound Rationale: Removal of non-viable soft tissue from the wound to promote healing.  Anesthesia: none Post-Debridement Wound Measurements: Noted above Type of Debridement: Sharp Excisional Tissue Removed: Non-viable soft tissue Depth of Debridement: subcutaneous tissue. Technique:  Sharp excisional debridement to bleeding, viable wound base.  Dressing: Dry, sterile, compression dressing. Disposition: Patient tolerated procedure well.     Return in about 3 weeks (around 05/18/2024) for wound care, after MRI to review.

## 2024-04-30 ENCOUNTER — Ambulatory Visit
Admission: RE | Admit: 2024-04-30 | Discharge: 2024-04-30 | Disposition: A | Discharge status: Home / Self Care | Source: Ambulatory Visit | Attending: Podiatry

## 2024-04-30 DIAGNOSIS — L97522 Non-pressure chronic ulcer of other part of left foot with fat layer exposed: Secondary | ICD-10-CM

## 2024-05-10 NOTE — Telephone Encounter (Signed)
 Copied from CRM #[id number]. Topic: General - Other >> May 10, 2024  8:33 AM Aleatha C wrote: Reason for CRM: Patient sent in a message via mychart and wants to make sure that Dr Berneta sees it, he says after a 10 day regiment of  taking pantoprazole  and no longer having it , his pain has gotten worst and would like a call back from Dr Berneta to discuss what to do next

## 2024-05-11 ENCOUNTER — Encounter: Payer: Self-pay | Admitting: Family Medicine

## 2024-05-11 ENCOUNTER — Ambulatory Visit: Admitting: Family Medicine

## 2024-05-11 VITALS — BP 128/68 | HR 76 | Temp 97.6°F | Ht 75.0 in | Wt 215.2 lb

## 2024-05-11 DIAGNOSIS — M79651 Pain in right thigh: Secondary | ICD-10-CM | POA: Insufficient documentation

## 2024-05-11 DIAGNOSIS — M542 Cervicalgia: Secondary | ICD-10-CM | POA: Diagnosis not present

## 2024-05-11 DIAGNOSIS — M353 Polymyalgia rheumatica: Secondary | ICD-10-CM | POA: Diagnosis not present

## 2024-05-11 DIAGNOSIS — M25512 Pain in left shoulder: Secondary | ICD-10-CM

## 2024-05-11 DIAGNOSIS — M25511 Pain in right shoulder: Secondary | ICD-10-CM | POA: Diagnosis not present

## 2024-05-11 DIAGNOSIS — M79652 Pain in left thigh: Secondary | ICD-10-CM

## 2024-05-11 MED ORDER — PREDNISONE 5 MG PO TABS: 5.0000 mg | ORAL_TABLET | Freq: Two times a day (BID) | ORAL | 1 refills | Status: DC

## 2024-05-11 NOTE — Progress Notes (Signed)
 Established Patient Office Visit   Subjective:  Patient ID: Kristopher Wilson, male    DOB: [birth year]  Age: 88 y.o. MRN: [id number]  Chief Complaint  Patient presents with   Temporomandibular Joint Pain    Joint pain in arms and legs x 4 days. Pt states he has limited mobility in his right arm and hand with some tingling and numbness. No chest pain, SOB, headache.     HPI Encounter Diagnoses  Name Primary?   Acute pain of both shoulders Yes   Bilateral thigh pain    Neck pain    PMR (polymyalgia rheumatica) (HCC)    Ongoing neck, bilateral shoulder pain with stiffness, bilateral thigh and knee pain that had been relieved with 10 days of prednisone  twice daily.  Sed rate was high normal but CRP was less than 1.  Symptoms returned after completing 10-day course of prednisone .  Still suspect PMR.   Review of Systems  Constitutional: Negative.   HENT: Negative.    Eyes:  Negative for blurred vision, discharge and redness.  Respiratory: Negative.    Cardiovascular: Negative.   Gastrointestinal:  Negative for abdominal pain.  Genitourinary: Negative.   Musculoskeletal:  Positive for joint pain and neck pain. Negative for myalgias.  Skin:  Negative for rash.  Neurological:  Negative for tingling, loss of consciousness and weakness.  Endo/Heme/Allergies:  Negative for polydipsia.     Current Outpatient Medications:    predniSONE  (DELTASONE ) 5 MG tablet, Take 1 tablet (5 mg total) by mouth 2 (two) times daily with a meal., Disp: 60 tablet, Rfl: 1   amLODipine  (NORVASC ) 10 MG tablet, TAKE 1 TABLET BY MOUTH EVERY DAY, Disp: 90 tablet, Rfl: 3   atorvastatin  (LIPITOR) 40 MG tablet, TAKE 1 TABLET DAILY, Disp: 90 tablet, Rfl: 1   carvedilol  (COREG ) 12.5 MG tablet, TAKE 1 TABLET DAILY, Disp: 90 tablet, Rfl: 2   clopidogrel  (PLAVIX ) 75 MG tablet, TAKE 1 TABLET DAILY, Disp: 90 tablet, Rfl: 1   cyanocobalamin  (VITAMIN B12) 1000 MCG tablet, Take 1 tablet (1,000 mcg total) by mouth daily.,  Disp: 90 tablet, Rfl: 1   dapagliflozin  propanediol (FARXIGA ) 10 MG TABS tablet, Take 1 tablet (10 mg total) by mouth daily before breakfast., Disp: 90 tablet, Rfl: 1   fenofibrate  (TRICOR ) 145 MG tablet, Take 1 tablet (145 mg total) by mouth daily., Disp: 90 tablet, Rfl: 0   hydrochlorothiazide  (HYDRODIURIL ) 25 MG tablet, Take 1 tablet (25 mg total) by mouth daily., Disp: 90 tablet, Rfl: 3   isosorbide  mononitrate (IMDUR ) 60 MG 24 hr tablet, TAKE 1 TABLET BY MOUTH EVERY DAY, Disp: 90 tablet, Rfl: 1   methocarbamol  (ROBAXIN ) 500 MG tablet, Take 1 tablet (500 mg total) by mouth every 8 (eight) hours as needed for muscle spasms., Disp: 30 tablet, Rfl: 0   mupirocin  ointment (BACTROBAN ) 2 %, Apply 1 Application topically 2 (two) times daily., Disp: 22 g, Rfl: 0   nitroGLYCERIN  (NITROSTAT ) 0.4 MG SL tablet, Place 1 tablet (0.4 mg total) under the tongue every 5 (five) minutes as needed for chest pain., Disp: 25 tablet, Rfl: 6   pantoprazole  (PROTONIX ) 40 MG tablet, TAKE 1 TABLET DAILY, Disp: 90 tablet, Rfl: 1   pramipexole  (MIRAPEX ) 1 MG tablet, TAKE 1 TABLET AT BEDTIME, Disp: 90 tablet, Rfl: 1   sulfamethoxazole -trimethoprim  (BACTRIM  DS) 800-160 MG tablet, Take 1 tablet by mouth 2 (two) times daily., Disp: 20 tablet, Rfl: 1   traMADol  (ULTRAM ) 50 MG tablet, Take 1-2 tablets (50-100 mg total)  by mouth every 6 (six) hours as needed for moderate pain (pain score 4-6)., Disp: 12 tablet, Rfl: 0   Vitamin D , Ergocalciferol , (DRISDOL ) 1.25 MG (50000 UNIT) CAPS capsule, Take 1 capsule (50,000 Units total) by mouth every 7 (seven) days., Disp: 12 capsule, Rfl: 1  Current Facility-Administered Medications:    ipratropium-albuterol  (DUONEB) 0.5-2.5 (3) MG/3ML nebulizer solution 3 mL, 3 mL, Nebulization, Once, Nafziger, Darleene, NP   Objective:     BP 128/68 (BP Location: Left Arm, Patient Position: Sitting, Cuff Size: Normal)   Pulse 76   Temp 97.6 F (36.4 C) (Temporal)   Ht 6' 3 (1.905 m)   Wt 215 lb 3.2  oz (97.6 kg)   SpO2 97%   BMI 26.90 kg/m    Physical Exam   No results found for any visits on 05/11/24.    The ASCVD Risk score (Arnett DK, et al., 2019) failed to calculate for the following reasons:   The 2019 ASCVD risk score is only valid for ages 76 to 28    Assessment & Plan:   Acute pain of both shoulders  Bilateral thigh pain  Neck pain  PMR (polymyalgia rheumatica) (HCC) -     Ambulatory referral to Rheumatology -     predniSONE ; Take 1 tablet (5 mg total) by mouth 2 (two) times daily with a meal.  Dispense: 60 tablet; Refill: 1    Return Has scheduled appointment in October..  Suspect PMR with positive response of symptoms to prednisone .  Sed rate was high normal.  CRP less than 1.  Will start prednisone  5 mg twice daily which may be continued pending rheumatology consultation.  Elsie Sim Lent, MD

## 2024-05-15 ENCOUNTER — Ambulatory Visit: Payer: Self-pay | Admitting: Podiatry

## 2024-05-16 ENCOUNTER — Ambulatory Visit (INDEPENDENT_AMBULATORY_CARE_PROVIDER_SITE_OTHER): Admitting: Podiatry

## 2024-05-16 VITALS — Ht 75.0 in | Wt 215.2 lb

## 2024-05-16 DIAGNOSIS — L97522 Non-pressure chronic ulcer of other part of left foot with fat layer exposed: Secondary | ICD-10-CM

## 2024-05-16 NOTE — Progress Notes (Signed)
  Subjective:  Patient ID: Kristopher Wilson, male    DOB: 01-16-1936,  MRN: [id number]  Chief Complaint  Patient presents with   Wound Check    Rm 1 Pt is here for wound care and MRI results. Wound is closed with no drainage.    88 y.o. male presents with the above complaint. History confirmed with patient.  They have been using Prisma collagen at home  Objective:  Physical Exam: warm, good capillary refill, no trophic changes or ulcerative lesions, normal sensory exam, ulceration at submetatarsal 5 left measures 0.4 x 0.2 x 0.3 cm with surrounding hyperkeratosis fibrogranular wound bed and reduced peripheral pulses.      ABI 03/16/2024 on the left 1.14 TBI 0.92 with a toe pressure of 130   MRI showed osteitis without definitive infection frontal plane view does show prominent bone deep to the ulceration  Assessment:   1. Ulcer of left foot with fat layer exposed (HCC)       Plan:  Patient was evaluated and treated and all questions answered.  Ulcer left foot -We discussed the etiology and factors that are a part of the wound healing process.   -Debridement as below. -Dressed with Prisma collagen, DSD. -Continue home dressing changes 2-3 times weekly with Prisma collagen and adhesive bandage or gauze -Continue off-loading with surgical shoe and peg assist device.   - MRI results reviewed and free of infection still could consider osteotomy or ostectomy to reduce the prominence here as well.  Return in 4 weeks to reevaluate.   Procedure: Excisional Debridement of Wound Rationale: Removal of non-viable soft tissue from the wound to promote healing.  Anesthesia: none Post-Debridement Wound Measurements: Noted above Type of Debridement: Sharp Excisional Tissue Removed: Non-viable soft tissue Depth of Debridement: subcutaneous tissue. Technique: Sharp excisional debridement to bleeding, viable wound base.  Dressing: Dry, sterile, compression dressing. Disposition: Patient  tolerated procedure well.     Return in about 4 weeks (around 06/13/2024) for wound care.

## 2024-05-18 ENCOUNTER — Ambulatory Visit: Admitting: Family Medicine

## 2024-05-31 ENCOUNTER — Other Ambulatory Visit: Payer: Self-pay | Admitting: Cardiology

## 2024-06-03 ENCOUNTER — Other Ambulatory Visit: Payer: Self-pay | Admitting: Family Medicine

## 2024-06-03 DIAGNOSIS — R7303 Prediabetes: Secondary | ICD-10-CM

## 2024-06-03 DIAGNOSIS — N1831 Chronic kidney disease, stage 3a: Secondary | ICD-10-CM

## 2024-06-03 DIAGNOSIS — I509 Heart failure, unspecified: Secondary | ICD-10-CM

## 2024-06-06 ENCOUNTER — Encounter: Payer: Self-pay | Admitting: Family Medicine

## 2024-06-13 ENCOUNTER — Encounter: Payer: Self-pay | Admitting: Family Medicine

## 2024-06-13 ENCOUNTER — Ambulatory Visit: Admitting: Family Medicine

## 2024-06-13 VITALS — BP 124/84 | HR 72 | Temp 97.0°F | Ht 75.0 in | Wt 215.0 lb

## 2024-06-13 DIAGNOSIS — M353 Polymyalgia rheumatica: Secondary | ICD-10-CM

## 2024-06-13 DIAGNOSIS — E538 Deficiency of other specified B group vitamins: Secondary | ICD-10-CM

## 2024-06-13 DIAGNOSIS — Z23 Encounter for immunization: Secondary | ICD-10-CM | POA: Diagnosis not present

## 2024-06-13 DIAGNOSIS — B353 Tinea pedis: Secondary | ICD-10-CM

## 2024-06-13 DIAGNOSIS — G2581 Restless legs syndrome: Secondary | ICD-10-CM

## 2024-06-13 DIAGNOSIS — K219 Gastro-esophageal reflux disease without esophagitis: Secondary | ICD-10-CM | POA: Diagnosis not present

## 2024-06-13 DIAGNOSIS — E559 Vitamin D deficiency, unspecified: Secondary | ICD-10-CM

## 2024-06-13 MED ORDER — VITAMIN B-12 1000 MCG PO TABS: 1000.0000 ug | ORAL_TABLET | Freq: Every day | ORAL | 1 refills | Status: AC

## 2024-06-13 MED ORDER — PRAMIPEXOLE DIHYDROCHLORIDE 1 MG PO TABS: 1.0000 mg | ORAL_TABLET | Freq: Every day | ORAL | 1 refills | Status: AC

## 2024-06-13 MED ORDER — KETOCONAZOLE 2 % EX CREA: TOPICAL_CREAM | CUTANEOUS | 0 refills | Status: DC

## 2024-06-13 MED ORDER — PANTOPRAZOLE SODIUM 40 MG PO TBEC: 40.0000 mg | DELAYED_RELEASE_TABLET | Freq: Every day | ORAL | 1 refills | Status: AC

## 2024-06-13 NOTE — Progress Notes (Signed)
 Established Patient Office Visit   Subjective:  Patient ID: Kristopher Wilson, male    DOB: 1936-04-12  Age: 88 y.o. MRN: [id number]  Chief Complaint  Patient presents with   Medical Management of Chronic Issues    6 month follow up. Pt is fasting. Flu shot today. Pt need to get Tdap at pharmacy. Foot exam needed today.    Arthritis    Pt states his entire body has constant aches and pain even when using prednisone .     Arthritis Pertinent negatives include no rash.   Encounter Diagnoses  Name Primary?   PMR (polymyalgia rheumatica) Yes   Restless leg syndrome    Gastroesophageal reflux disease, unspecified whether esophagitis present    B12 deficiency    Vitamin D  deficiency    Immunization due    For follow-up of above.  5 mg of prednisone  twice daily has been helpful for the pain in his thighs but his shoulders remain sore.  Original sed rate was elevated but then returned to normal with follow-up check.  He is scheduled to see rheumatology on Monday.  B12 and multivitamin with iron was never filled.  He is taking the high-dose weekly vitamin D  tablet.  Ongoing neuropathy in left foot after zoster but is developing neuropathy in his right foot as well.   Review of Systems  Constitutional: Negative.   HENT: Negative.    Eyes:  Negative for blurred vision, discharge and redness.  Respiratory: Negative.    Cardiovascular: Negative.   Gastrointestinal:  Negative for abdominal pain.  Genitourinary: Negative.   Musculoskeletal:  Positive for arthritis. Negative for myalgias.  Skin:  Negative for rash.  Neurological:  Positive for tingling. Negative for loss of consciousness and weakness.  Endo/Heme/Allergies:  Negative for polydipsia.     Current Outpatient Medications:    amLODipine  (NORVASC ) 10 MG tablet, TAKE 1 TABLET BY MOUTH EVERY DAY, Disp: 30 tablet, Rfl: 0   atorvastatin  (LIPITOR) 40 MG tablet, TAKE 1 TABLET DAILY, Disp: 90 tablet, Rfl: 1   carvedilol  (COREG ) 12.5  MG tablet, TAKE 1 TABLET DAILY, Disp: 90 tablet, Rfl: 2   clopidogrel  (PLAVIX ) 75 MG tablet, TAKE 1 TABLET DAILY, Disp: 90 tablet, Rfl: 1   cyanocobalamin  (VITAMIN B12) 1000 MCG tablet, Take 1 tablet (1,000 mcg total) by mouth daily., Disp: 90 tablet, Rfl: 1   cyanocobalamin  (VITAMIN B12) 1000 MCG tablet, Take 1 tablet (1,000 mcg total) by mouth daily., Disp: 90 tablet, Rfl: 1   dapagliflozin  propanediol (FARXIGA ) 10 MG TABS tablet, TAKE 1 TABLET DAILY BEFORE BREAKFAST, Disp: 90 tablet, Rfl: 1   fenofibrate  (TRICOR ) 145 MG tablet, Take 1 tablet (145 mg total) by mouth daily., Disp: 90 tablet, Rfl: 0   hydrochlorothiazide  (HYDRODIURIL ) 25 MG tablet, Take 1 tablet (25 mg total) by mouth daily., Disp: 90 tablet, Rfl: 3   isosorbide  mononitrate (IMDUR ) 60 MG 24 hr tablet, TAKE 1 TABLET BY MOUTH EVERY DAY, Disp: 90 tablet, Rfl: 1   methocarbamol  (ROBAXIN ) 500 MG tablet, Take 1 tablet (500 mg total) by mouth every 8 (eight) hours as needed for muscle spasms., Disp: 30 tablet, Rfl: 0   mupirocin  ointment (BACTROBAN ) 2 %, Apply 1 Application topically 2 (two) times daily., Disp: 22 g, Rfl: 0   nitroGLYCERIN  (NITROSTAT ) 0.4 MG SL tablet, Place 1 tablet (0.4 mg total) under the tongue every 5 (five) minutes as needed for chest pain., Disp: 25 tablet, Rfl: 6   predniSONE  (DELTASONE ) 5 MG tablet, Take 1 tablet (5  mg total) by mouth 2 (two) times daily with a meal., Disp: 60 tablet, Rfl: 1   traMADol  (ULTRAM ) 50 MG tablet, Take 1-2 tablets (50-100 mg total) by mouth every 6 (six) hours as needed for moderate pain (pain score 4-6)., Disp: 12 tablet, Rfl: 0   Vitamin D , Ergocalciferol , (DRISDOL ) 1.25 MG (50000 UNIT) CAPS capsule, Take 1 capsule (50,000 Units total) by mouth every 7 (seven) days., Disp: 12 capsule, Rfl: 1   pantoprazole  (PROTONIX ) 40 MG tablet, Take 1 tablet (40 mg total) by mouth daily., Disp: 90 tablet, Rfl: 1   pramipexole  (MIRAPEX ) 1 MG tablet, Take 1 tablet (1 mg total) by mouth at bedtime.,  Disp: 90 tablet, Rfl: 1   sulfamethoxazole -trimethoprim  (BACTRIM  DS) 800-160 MG tablet, Take 1 tablet by mouth 2 (two) times daily. (Patient not taking: Reported on 06/13/2024), Disp: 20 tablet, Rfl: 1  Current Facility-Administered Medications:    ipratropium-albuterol  (DUONEB) 0.5-2.5 (3) MG/3ML nebulizer solution 3 mL, 3 mL, Nebulization, Once, Nafziger, Darleene, NP   Objective:     BP 124/84 (BP Location: Left Arm, Patient Position: Sitting, Cuff Size: Normal)   Pulse 72   Temp (!) 97 F (36.1 C) (Temporal)   Ht 6' 3 (1.905 m)   Wt 215 lb (97.5 kg)   SpO2 96%   BMI 26.87 kg/m    Physical Exam Constitutional:      General: He is not in acute distress.    Appearance: Normal appearance. He is not ill-appearing, toxic-appearing or diaphoretic.  HENT:     Head: Normocephalic and atraumatic.     Right Ear: External ear normal.     Left Ear: External ear normal.  Eyes:     General: No scleral icterus.       Right eye: No discharge.        Left eye: No discharge.     Extraocular Movements: Extraocular movements intact.     Conjunctiva/sclera: Conjunctivae normal.  Cardiovascular:     Rate and Rhythm: Normal rate and regular rhythm.     Pulses:          Dorsalis pedis pulses are 1+ on the right side and 1+ on the left side.       Posterior tibial pulses are 1+ on the right side and 1+ on the left side.  Pulmonary:     Effort: Pulmonary effort is normal. No respiratory distress.     Breath sounds: No wheezing or rales.  Skin:    General: Skin is warm and dry.  Neurological:     Mental Status: He is alert and oriented to person, place, and time.  Psychiatric:        Mood and Affect: Mood normal.        Behavior: Behavior normal.    Diabetic Foot Exam - Simple   Simple Foot Form Diabetic Foot exam was performed with the following findings: Yes 06/13/2024  9:10 AM  Visual Inspection See comments: Yes Sensation Testing See comments: Yes Pulse Check See comments:  Yes Comments Feet with relaxed arches.  There is scaling across the plantar surface and the lateral feet.  Interdigital spaces are also affected with scaling.  Pulses are diminished capillary refill is less than 2 seconds.  Sensory is diminished to monofilament testing.      No results found for any visits on 06/13/24.    The ASCVD Risk score (Arnett DK, et al., 2019) failed to calculate for the following reasons:   The 2019 ASCVD risk score  is only valid for ages 56 to 33    Assessment & Plan:   PMR (polymyalgia rheumatica)  Restless leg syndrome -     Pramipexole  Dihydrochloride; Take 1 tablet (1 mg total) by mouth at bedtime.  Dispense: 90 tablet; Refill: 1  Gastroesophageal reflux disease, unspecified whether esophagitis present -     Pantoprazole  Sodium; Take 1 tablet (40 mg total) by mouth daily.  Dispense: 90 tablet; Refill: 1  B12 deficiency -     Vitamin B-12; Take 1 tablet (1,000 mcg total) by mouth daily.  Dispense: 90 tablet; Refill: 1  Vitamin D  deficiency  Immunization due -     Flu vaccine HIGH DOSE PF(Fluzone Trivalent)    Return in about 4 weeks (around 07/11/2024).  Has follow-up with podiatry and rheumatology next week.  Elsie Sim Lent, MD

## 2024-06-15 ENCOUNTER — Ambulatory Visit (INDEPENDENT_AMBULATORY_CARE_PROVIDER_SITE_OTHER): Admitting: Podiatry

## 2024-06-15 ENCOUNTER — Encounter: Payer: Self-pay | Admitting: Podiatry

## 2024-06-15 VITALS — Ht 75.0 in | Wt 215.0 lb

## 2024-06-15 DIAGNOSIS — L97522 Non-pressure chronic ulcer of other part of left foot with fat layer exposed: Secondary | ICD-10-CM

## 2024-06-15 NOTE — Progress Notes (Signed)
  Subjective:  Patient ID: Kristopher Wilson, male    DOB: [birth year],  MRN: [id number]  Chief Complaint  Patient presents with   Wound Check    Rm 23 Patient is here for ulcer of the left foot. Wound is open with moderate drainage.     88 y.o. male presents with the above complaint. History confirmed with patient.  They have been using Prisma collagen at home  Objective:  Physical Exam: warm, good capillary refill, no trophic changes or ulcerative lesions, normal sensory exam, ulceration at submetatarsal 5 left measures 0.4 x 0.3 x 0.3 cm with surrounding hyperkeratosis fibrogranular wound bed and reduced peripheral pulses.      ABI 03/16/2024 on the left 1.14 TBI 0.92 with a toe pressure of 130   MRI showed osteitis without definitive infection frontal plane view does show prominent bone deep to the ulceration  Assessment:   1. Ulcer of left foot with fat layer exposed (HCC)       Plan:  Patient was evaluated and treated and all questions answered.  Ulcer left foot -We discussed the etiology and factors that are a part of the wound healing process.   -Debridement as below. -Dressed with Prisma collagen, DSD. -Continue home dressing changes 2-3 times weekly with Prisma collagen and adhesive bandage or gauze -Continue off-loading with surgical shoe and peg assist device.  Additional dancers pad added to offload further - We again discussed osteotomy of the fifth metatarsal to alleviate further pressure.  He wants to continue local wound care at this point.  Follow-up with me in 4 weeks to reevaluate.   Procedure: Excisional Debridement of Wound Rationale: Removal of non-viable soft tissue from the wound to promote healing.  Anesthesia: none Post-Debridement Wound Measurements: Noted above Type of Debridement: Sharp Excisional Tissue Removed: Non-viable soft tissue Depth of Debridement: subcutaneous tissue. Technique: Sharp excisional debridement to bleeding, viable wound  base.  Dressing: Dry, sterile, compression dressing. Disposition: Patient tolerated procedure well.     No follow-ups on file.

## 2024-06-25 ENCOUNTER — Other Ambulatory Visit: Payer: Self-pay | Admitting: Cardiology

## 2024-06-29 LAB — TSH: TSH: 7.08 — AB (ref 0.41–5.90)

## 2024-07-11 ENCOUNTER — Encounter: Payer: Self-pay | Admitting: Family Medicine

## 2024-07-13 ENCOUNTER — Ambulatory Visit: Admitting: Podiatry

## 2024-07-13 VITALS — Ht 75.0 in | Wt 215.0 lb

## 2024-07-13 DIAGNOSIS — L97522 Non-pressure chronic ulcer of other part of left foot with fat layer exposed: Secondary | ICD-10-CM

## 2024-07-13 NOTE — Progress Notes (Signed)
  Subjective:  Patient ID: Kristopher Wilson, male    DOB: 1936-07-22,  MRN: [id number]  Chief Complaint  Patient presents with   Wound Check    RM 7 Patient is here for ulcer of the left foot. Ulcer is open with moderate drainage, Pt states issues with wearing surgical shoe with socks.    88 y.o. male presents with the above complaint. History confirmed with patient.  He notes improvement  Objective:  Physical Exam: warm, good capillary refill, no trophic changes or ulcerative lesions, normal sensory exam, ulceration at submetatarsal 5 left has healed there is a small scab over this healing skin tear      ABI 03/16/2024 on the left 1.14 TBI 0.92 with a toe pressure of 130   MRI showed osteitis without definitive infection frontal plane view does show prominent bone deep to the ulceration  Assessment:   1. Ulcer of left foot with fat layer exposed (HCC)        Plan:  Patient was evaluated and treated and all questions answered.  Ulcer left foot - Doing well and is nearly fully healed.  He had some drainage from a skin tear that is healing uneventfully.  I dispensed a dancers pad and he may return to regular shoe gear.  Regular skin care as tolerated.  Discussed with him long-term he may not need surgery to offload the metatarsal if this continues to heal uneventfully but a foot orthosis may be beneficial long-term.    Return in about 1 month (around 08/12/2024) for wound check left foot.

## 2024-07-20 ENCOUNTER — Encounter: Payer: Self-pay | Admitting: Family Medicine

## 2024-07-20 ENCOUNTER — Ambulatory Visit: Payer: Self-pay | Admitting: Family Medicine

## 2024-07-20 ENCOUNTER — Other Ambulatory Visit: Payer: Self-pay | Admitting: Cardiology

## 2024-07-20 ENCOUNTER — Ambulatory Visit: Admitting: Family Medicine

## 2024-07-20 VITALS — BP 102/68 | HR 79 | Temp 97.6°F | Ht 75.0 in | Wt 221.0 lb

## 2024-07-20 DIAGNOSIS — Z23 Encounter for immunization: Secondary | ICD-10-CM

## 2024-07-20 DIAGNOSIS — E538 Deficiency of other specified B group vitamins: Secondary | ICD-10-CM

## 2024-07-20 DIAGNOSIS — G5793 Unspecified mononeuropathy of bilateral lower limbs: Secondary | ICD-10-CM

## 2024-07-20 DIAGNOSIS — R7303 Prediabetes: Secondary | ICD-10-CM

## 2024-07-20 DIAGNOSIS — E039 Hypothyroidism, unspecified: Secondary | ICD-10-CM | POA: Diagnosis not present

## 2024-07-20 LAB — BASIC METABOLIC PANEL WITH GFR
BUN: 22 mg/dL (ref 6–23)
CO2: 28 meq/L (ref 19–32)
Calcium: 9.1 mg/dL (ref 8.4–10.5)
Chloride: 99 meq/L (ref 96–112)
Creatinine, Ser: 1.34 mg/dL (ref 0.40–1.50)
GFR: 48.08 mL/min — ABNORMAL LOW (ref >60.00)
Glucose, Bld: 109 mg/dL — ABNORMAL HIGH (ref 70–99)
Potassium: 4.1 meq/L (ref 3.5–5.1)
Sodium: 137 meq/L (ref 135–145)

## 2024-07-20 LAB — HEMOGLOBIN A1C: Hgb A1c MFr Bld: 6.5 % (ref 4.6–6.5)

## 2024-07-20 LAB — TSH: TSH: 4.16 u[IU]/mL (ref 0.35–5.50)

## 2024-07-20 MED ORDER — LEVOTHYROXINE SODIUM 25 MCG PO TABS: 25.0000 ug | ORAL_TABLET | Freq: Every day | ORAL | 0 refills | Status: DC

## 2024-07-20 NOTE — Progress Notes (Signed)
 Established Patient Office Visit   Subjective:  Patient ID: Kristopher Wilson, male    DOB: 1936-08-26  Age: 88 y.o. MRN: [id number]  Chief Complaint  Patient presents with   Medical Management of Chronic Issues    4 week f/u. Wants to discuss non invasive blood sugar check.     HPI Encounter Diagnoses  Name Primary?   Acquired hypothyroidism Yes   Immunization due    B12 deficiency    Pre-diabetes    Neuropathy of both feet    For follow-up of above.  Status post visit with rheumatology.  Patient has been started on Plaquenil and a taper off of prednisone .  Continues Farxiga  for CHF and prediabetes.  Continues B12.  Needs to start treatment for hypothyroidism.  Ongoing paresthesias in the feet.   Review of Systems  Constitutional: Negative.   HENT: Negative.    Eyes:  Negative for blurred vision, discharge and redness.  Respiratory: Negative.    Cardiovascular: Negative.   Gastrointestinal:  Negative for abdominal pain.  Genitourinary: Negative.   Musculoskeletal: Negative.  Negative for myalgias.  Skin:  Negative for rash.  Neurological:  Negative for tingling, loss of consciousness and weakness.  Endo/Heme/Allergies:  Negative for polydipsia.     Current Outpatient Medications:    amLODipine  (NORVASC ) 10 MG tablet, TAKE 1 TABLET BY MOUTH EVERY DAY, Disp: 30 tablet, Rfl: 0   atorvastatin  (LIPITOR) 40 MG tablet, TAKE 1 TABLET DAILY, Disp: 90 tablet, Rfl: 1   carvedilol  (COREG ) 12.5 MG tablet, TAKE 1 TABLET DAILY, Disp: 90 tablet, Rfl: 2   clopidogrel  (PLAVIX ) 75 MG tablet, TAKE 1 TABLET DAILY, Disp: 90 tablet, Rfl: 1   cyanocobalamin  (VITAMIN B12) 1000 MCG tablet, Take 1 tablet (1,000 mcg total) by mouth daily., Disp: 90 tablet, Rfl: 1   cyanocobalamin  (VITAMIN B12) 1000 MCG tablet, Take 1 tablet (1,000 mcg total) by mouth daily., Disp: 90 tablet, Rfl: 1   dapagliflozin  propanediol (FARXIGA ) 10 MG TABS tablet, TAKE 1 TABLET DAILY BEFORE BREAKFAST, Disp: 90 tablet, Rfl:  1   fenofibrate  (TRICOR ) 145 MG tablet, Take 1 tablet (145 mg total) by mouth daily., Disp: 90 tablet, Rfl: 0   hydrochlorothiazide  (HYDRODIURIL ) 25 MG tablet, Take 1 tablet (25 mg total) by mouth daily., Disp: 90 tablet, Rfl: 3   isosorbide  mononitrate (IMDUR ) 60 MG 24 hr tablet, TAKE 1 TABLET BY MOUTH EVERY DAY, Disp: 90 tablet, Rfl: 1   ketoconazole (NIZORAL) 2 % cream, Apply a thin coat to feet including the interdigital spaces daily for 14 days., Disp: 60 g, Rfl: 0   levothyroxine (SYNTHROID) 25 MCG tablet, Take 1 tablet (25 mcg total) by mouth daily., Disp: 90 tablet, Rfl: 0   methocarbamol  (ROBAXIN ) 500 MG tablet, Take 1 tablet (500 mg total) by mouth every 8 (eight) hours as needed for muscle spasms., Disp: 30 tablet, Rfl: 0   mupirocin  ointment (BACTROBAN ) 2 %, Apply 1 Application topically 2 (two) times daily., Disp: 22 g, Rfl: 0   nitroGLYCERIN  (NITROSTAT ) 0.4 MG SL tablet, Place 1 tablet (0.4 mg total) under the tongue every 5 (five) minutes as needed for chest pain., Disp: 25 tablet, Rfl: 6   pantoprazole  (PROTONIX ) 40 MG tablet, Take 1 tablet (40 mg total) by mouth daily., Disp: 90 tablet, Rfl: 1   pramipexole  (MIRAPEX ) 1 MG tablet, Take 1 tablet (1 mg total) by mouth at bedtime., Disp: 90 tablet, Rfl: 1   sulfamethoxazole -trimethoprim  (BACTRIM  DS) 800-160 MG tablet, Take 1 tablet by mouth 2 (  two) times daily., Disp: 20 tablet, Rfl: 1   traMADol  (ULTRAM ) 50 MG tablet, Take 1-2 tablets (50-100 mg total) by mouth every 6 (six) hours as needed for moderate pain (pain score 4-6)., Disp: 12 tablet, Rfl: 0   Vitamin D , Ergocalciferol , (DRISDOL ) 1.25 MG (50000 UNIT) CAPS capsule, Take 1 capsule (50,000 Units total) by mouth every 7 (seven) days., Disp: 12 capsule, Rfl: 1   predniSONE  (DELTASONE ) 5 MG tablet, Take 1 tablet (5 mg total) by mouth 2 (two) times daily with a meal., Disp: 60 tablet, Rfl: 1  Current Facility-Administered Medications:    ipratropium-albuterol  (DUONEB) 0.5-2.5 (3)  MG/3ML nebulizer solution 3 mL, 3 mL, Nebulization, Once, Nafziger, Darleene, NP   Objective:     BP 102/68   Pulse 79   Temp 97.6 F (36.4 C)   Ht 6' 3 (1.905 m)   Wt 221 lb (100.2 kg)   SpO2 92%   BMI 27.62 kg/m    Physical Exam Constitutional:      General: He is not in acute distress.    Appearance: Normal appearance. He is not ill-appearing, toxic-appearing or diaphoretic.  HENT:     Head: Normocephalic and atraumatic.     Right Ear: External ear normal.     Left Ear: External ear normal.  Eyes:     General: No scleral icterus.       Right eye: No discharge.        Left eye: No discharge.     Extraocular Movements: Extraocular movements intact.     Conjunctiva/sclera: Conjunctivae normal.  Cardiovascular:     Rate and Rhythm: Normal rate and regular rhythm.     Pulses:          Dorsalis pedis pulses are 1+ on the right side and 1+ on the left side.       Posterior tibial pulses are 1+ on the right side and 1+ on the left side.  Pulmonary:     Effort: Pulmonary effort is normal. No respiratory distress.     Breath sounds: No wheezing or rales.  Skin:    General: Skin is warm and dry.  Neurological:     Mental Status: He is alert and oriented to person, place, and time.  Psychiatric:        Mood and Affect: Mood normal.        Behavior: Behavior normal.    Diabetic Foot Exam - Simple   Simple Foot Form Diabetic Foot exam was performed with the following findings: Yes 07/20/2024 11:10 AM  Visual Inspection See comments: Yes Sensation Testing See comments: Yes Pulse Check See comments: Yes Comments Integument of feet is dry and scaling.  Decreased sensation to monofilament line testing in all toes and soles of feet.  Capillary refill is excellent and great toes of both feet.       No results found for any visits on 07/20/24.    The ASCVD Risk score (Arnett DK, et al., 2019) failed to calculate for the following reasons:   The 2019 ASCVD risk score is  only valid for ages 49 to 82    Assessment & Plan:   Acquired hypothyroidism -     TSH -     Levothyroxine Sodium; Take 1 tablet (25 mcg total) by mouth daily.  Dispense: 90 tablet; Refill: 0  Immunization due -     Tdap vaccine greater than or equal to 7yo IM  B12 deficiency  Pre-diabetes -     Basic  metabolic panel with GFR -     Hemoglobin A1c  Neuropathy of both feet    Return in about 10 weeks (around 09/28/2024), or Take levothyroxine on a fasting stomach each morning 30 minutes prior to eating.  Start levothyroxine for hypothyroidism.  Continue Farxiga  for prediabetes and congestive heart failure.  Continue B12 supplementation.  Elsie Sim Lent, MD

## 2024-07-24 ENCOUNTER — Other Ambulatory Visit: Payer: Self-pay | Admitting: Cardiology

## 2024-08-10 ENCOUNTER — Ambulatory Visit: Admitting: Podiatry

## 2024-08-10 VITALS — Ht 75.0 in | Wt 221.0 lb

## 2024-08-10 DIAGNOSIS — L97522 Non-pressure chronic ulcer of other part of left foot with fat layer exposed: Secondary | ICD-10-CM | POA: Diagnosis not present

## 2024-08-10 NOTE — Progress Notes (Signed)
  Subjective:  Patient ID: Kristopher Wilson, male    DOB: 19-Aug-1936,  MRN: [id number]  Chief Complaint  Patient presents with   Wound Check    RM 21 Patient is here for left foott ulcer. Wound is scabbed over with no visible drainage.    88 y.o. male presents with the above complaint. History confirmed with patient.  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 Ulcer of left foot with fat layer exposed (HCC)        Plan:  Patient was evaluated and treated and all questions answered.  Ulcer left foot Callus debrided.  I applied a second dancers pad to his shoe to offload further.  Reevaluate in 1 month.  Long-term we discussed if this does not improvement osteotomy may be beneficial.    No follow-ups on file.

## 2024-08-20 ENCOUNTER — Other Ambulatory Visit: Payer: Self-pay | Admitting: Cardiology

## 2024-08-21 ENCOUNTER — Inpatient Hospital Stay (HOSPITAL_COMMUNITY)
Admission: EM | Admit: 2024-08-21 | Discharge: 2024-08-23 | DRG: 309 | Disposition: A | Discharge status: Home / Self Care | Attending: Internal Medicine | Admitting: Internal Medicine

## 2024-08-21 ENCOUNTER — Emergency Department (HOSPITAL_COMMUNITY)

## 2024-08-21 ENCOUNTER — Encounter (HOSPITAL_COMMUNITY): Payer: Self-pay

## 2024-08-21 ENCOUNTER — Other Ambulatory Visit: Payer: Self-pay

## 2024-08-21 DIAGNOSIS — G629 Polyneuropathy, unspecified: Secondary | ICD-10-CM | POA: Diagnosis present

## 2024-08-21 DIAGNOSIS — I5032 Chronic diastolic (congestive) heart failure: Secondary | ICD-10-CM | POA: Diagnosis present

## 2024-08-21 DIAGNOSIS — Z833 Family history of diabetes mellitus: Secondary | ICD-10-CM | POA: Diagnosis not present

## 2024-08-21 DIAGNOSIS — I129 Hypertensive chronic kidney disease with stage 1 through stage 4 chronic kidney disease, or unspecified chronic kidney disease: Secondary | ICD-10-CM | POA: Diagnosis present

## 2024-08-21 DIAGNOSIS — Z87891 Personal history of nicotine dependence: Secondary | ICD-10-CM

## 2024-08-21 DIAGNOSIS — G2581 Restless legs syndrome: Secondary | ICD-10-CM | POA: Diagnosis present

## 2024-08-21 DIAGNOSIS — I872 Venous insufficiency (chronic) (peripheral): Secondary | ICD-10-CM | POA: Diagnosis present

## 2024-08-21 DIAGNOSIS — I480 Paroxysmal atrial fibrillation: Principal | ICD-10-CM | POA: Diagnosis present

## 2024-08-21 DIAGNOSIS — I10 Essential (primary) hypertension: Secondary | ICD-10-CM | POA: Diagnosis not present

## 2024-08-21 DIAGNOSIS — Z66 Do not resuscitate: Secondary | ICD-10-CM | POA: Diagnosis present

## 2024-08-21 DIAGNOSIS — Z7901 Long term (current) use of anticoagulants: Secondary | ICD-10-CM

## 2024-08-21 DIAGNOSIS — Z79890 Hormone replacement therapy (postmenopausal): Secondary | ICD-10-CM | POA: Diagnosis not present

## 2024-08-21 DIAGNOSIS — E785 Hyperlipidemia, unspecified: Secondary | ICD-10-CM | POA: Diagnosis present

## 2024-08-21 DIAGNOSIS — Z85850 Personal history of malignant neoplasm of thyroid: Secondary | ICD-10-CM

## 2024-08-21 DIAGNOSIS — I4891 Unspecified atrial fibrillation: Secondary | ICD-10-CM | POA: Diagnosis present

## 2024-08-21 DIAGNOSIS — Z9079 Acquired absence of other genital organ(s): Secondary | ICD-10-CM

## 2024-08-21 DIAGNOSIS — K219 Gastro-esophageal reflux disease without esophagitis: Secondary | ICD-10-CM | POA: Diagnosis present

## 2024-08-21 DIAGNOSIS — Z8249 Family history of ischemic heart disease and other diseases of the circulatory system: Secondary | ICD-10-CM | POA: Diagnosis not present

## 2024-08-21 DIAGNOSIS — H9190 Unspecified hearing loss, unspecified ear: Secondary | ICD-10-CM | POA: Diagnosis present

## 2024-08-21 DIAGNOSIS — N1831 Chronic kidney disease, stage 3a: Secondary | ICD-10-CM | POA: Diagnosis present

## 2024-08-21 DIAGNOSIS — Z7902 Long term (current) use of antithrombotics/antiplatelets: Secondary | ICD-10-CM | POA: Diagnosis not present

## 2024-08-21 DIAGNOSIS — Z955 Presence of coronary angioplasty implant and graft: Secondary | ICD-10-CM

## 2024-08-21 DIAGNOSIS — Z881 Allergy status to other antibiotic agents status: Secondary | ICD-10-CM

## 2024-08-21 DIAGNOSIS — G8929 Other chronic pain: Secondary | ICD-10-CM | POA: Diagnosis present

## 2024-08-21 DIAGNOSIS — Z79899 Other long term (current) drug therapy: Secondary | ICD-10-CM | POA: Diagnosis not present

## 2024-08-21 DIAGNOSIS — E890 Postprocedural hypothyroidism: Secondary | ICD-10-CM | POA: Diagnosis present

## 2024-08-21 DIAGNOSIS — I2510 Atherosclerotic heart disease of native coronary artery without angina pectoris: Secondary | ICD-10-CM | POA: Diagnosis present

## 2024-08-21 DIAGNOSIS — M549 Dorsalgia, unspecified: Secondary | ICD-10-CM | POA: Diagnosis present

## 2024-08-21 DIAGNOSIS — Z8546 Personal history of malignant neoplasm of prostate: Secondary | ICD-10-CM

## 2024-08-21 DIAGNOSIS — F32A Depression, unspecified: Secondary | ICD-10-CM | POA: Diagnosis present

## 2024-08-21 DIAGNOSIS — E041 Nontoxic single thyroid nodule: Secondary | ICD-10-CM | POA: Diagnosis present

## 2024-08-21 DIAGNOSIS — Z8639 Personal history of other endocrine, nutritional and metabolic disease: Secondary | ICD-10-CM | POA: Diagnosis not present

## 2024-08-21 LAB — BASIC METABOLIC PANEL WITH GFR
Anion gap: 11 (ref 5–15)
BUN: 27 mg/dL — ABNORMAL HIGH (ref 8–23)
CO2: 24 mmol/L (ref 22–32)
Calcium: 9.1 mg/dL (ref 8.9–10.3)
Chloride: 101 mmol/L (ref 98–111)
Creatinine, Ser: 1.28 mg/dL — ABNORMAL HIGH (ref 0.61–1.24)
GFR, Estimated: 55 mL/min — ABNORMAL LOW (ref >60)
Glucose, Bld: 153 mg/dL — ABNORMAL HIGH (ref 70–99)
Potassium: 3.9 mmol/L (ref 3.5–5.1)
Sodium: 136 mmol/L (ref 135–145)

## 2024-08-21 LAB — CBC
HCT: 43.7 % (ref 39.0–52.0)
Hemoglobin: 14.4 g/dL (ref 13.0–17.0)
MCH: 29.1 pg (ref 26.0–34.0)
MCHC: 33 g/dL (ref 30.0–36.0)
MCV: 88.3 fL (ref 80.0–100.0)
Platelets: 281 K/uL (ref 150–400)
RBC: 4.95 MIL/uL (ref 4.22–5.81)
RDW: 14.9 % (ref 11.5–15.5)
WBC: 10.5 K/uL (ref 4.0–10.5)
nRBC: 0 % (ref 0.0–0.2)

## 2024-08-21 LAB — TROPONIN I (HIGH SENSITIVITY): Troponin I (High Sensitivity): 4 ng/L (ref <18)

## 2024-08-21 MED ORDER — DILTIAZEM LOAD VIA INFUSION
10.0000 mg | Freq: Once | INTRAVENOUS | Status: AC
  Administered 2024-08-21: 23:00:00 10 mg via INTRAVENOUS
  Filled 2024-08-21: qty 10

## 2024-08-21 MED ORDER — DILTIAZEM HCL-DEXTROSE 125-5 MG/125ML-% IV SOLN (PREMIX)
5.0000 mg/h | INTRAVENOUS | Status: DC
  Administered 2024-08-21: 23:00:00 5 mg/h via INTRAVENOUS
  Administered 2024-08-23: 05:00:00 7.5 mg/h via INTRAVENOUS
  Filled 2024-08-21 (×3): qty 125

## 2024-08-21 NOTE — ED Triage Notes (Signed)
 Pt c/o left sided chest pain that radiates down left arm and HA started 40 mins ago. PT c/o SOB and nausea earlier this morning, but has resolved.

## 2024-08-21 NOTE — ED Provider Triage Note (Signed)
 Emergency Medicine Provider Triage Evaluation Note  Kristopher Wilson , a 88 y.o. male  was evaluated in triage.  Pt complains of sudden onset chest pain radiating to jaw and headache.  EGD with rate of 140, no STEMI.  Patient will be going to room.  Review of Systems  Positive: CP Negative: SOB  Physical Exam  BP 118/77 (BP Location: Left Arm)   Pulse (!) 126   Temp (!) 97.3 F (36.3 C)   Resp (!) 23   SpO2 95%  Gen:   Awake, no distress   Resp:  Normal effort  MSK:   Moves extremities without difficulty  Other:    Medical Decision Making  Medically screening exam initiated at 9:41 PM.  Appropriate orders placed.  Kristopher Wilson was informed that the remainder of the evaluation will be completed by another provider, this initial triage assessment does not replace that evaluation, and the importance of remaining in the ED until their evaluation is complete.     Kristopher Warren SAILOR, PA-C 08/21/24 [id number]

## 2024-08-21 NOTE — ED Notes (Signed)
 Please update wife when have a chance, Kristopher Wilson (she's on [phone])

## 2024-08-21 NOTE — ED Provider Notes (Signed)
 [place] EMERGENCY DEPARTMENT AT [facility] Provider Note   CSN: [id number] Arrival date & time: 08/21/24  2113     Patient presents with: Chest Pain   Kristopher Wilson is a 88 y.o. male.   The history is provided by the patient and medical records.  Chest Pain  88 year old male with history of CAD status post stenting, GERD, CKD, history of thyroid  cancer status post partial thyroidectomy in February 2025, hypertension, hyperlipidemia, CHF, prediabetes, presenting to the ED for chest pain.  Patient was upstairs visiting his wife who is currently admitted.  He states he was getting in the recliner to try to sleep for the evening when he had sudden onset of chest pain, some radiation into the left arm and a headache with pain in the left side of the jaw.  By time of my evaluation he states most symptoms have resolved.  He denies feeling any palpitations or dizziness.  He does follow with cardiology, Dr. Jeffrie.  Prior to Admission medications  Medication Sig Start Date End Date Taking? Authorizing Provider  amLODipine  (NORVASC ) 10 MG tablet TAKE 1 TABLET BY MOUTH EVERY DAY 06/27/24   Jeffrie Oneil BROCKS, MD  atorvastatin  (LIPITOR) 40 MG tablet TAKE 1 TABLET DAILY 10/13/23   Jeffrie Oneil BROCKS, MD  carvedilol  (COREG ) 12.5 MG tablet TAKE 1 TABLET DAILY 07/27/24   Jeffrie Oneil BROCKS, MD  clopidogrel  (PLAVIX ) 75 MG tablet TAKE 1 TABLET DAILY 09/27/23   Jeffrie Oneil BROCKS, MD  cyanocobalamin  (VITAMIN B12) 1000 MCG tablet Take 1 tablet (1,000 mcg total) by mouth daily. 04/27/24   Berneta Elsie Sayre, MD  cyanocobalamin  (VITAMIN B12) 1000 MCG tablet Take 1 tablet (1,000 mcg total) by mouth daily. 06/13/24   Berneta Elsie Sayre, MD  dapagliflozin  propanediol (FARXIGA ) 10 MG TABS tablet TAKE 1 TABLET DAILY BEFORE BREAKFAST 06/06/24   Berneta Elsie Sayre, MD  fenofibrate  (TRICOR ) 145 MG tablet Take 1 tablet (145 mg total) by mouth daily. 02/10/24   Jeffrie Oneil BROCKS, MD  hydrochlorothiazide  (HYDRODIURIL ) 25  MG tablet Take 1 tablet (25 mg total) by mouth daily. 01/28/24   Lelon Hamilton T, PA-C  isosorbide  mononitrate (IMDUR ) 60 MG 24 hr tablet TAKE 1 TABLET BY MOUTH EVERY DAY 03/01/23   Jeffrie Oneil BROCKS, MD  ketoconazole  (NIZORAL ) 2 % cream Apply a thin coat to feet including the interdigital spaces daily for 14 days. 06/13/24   Berneta Elsie Sayre, MD  levothyroxine  (SYNTHROID ) 25 MCG tablet Take 1 tablet (25 mcg total) by mouth daily. 07/20/24   Berneta Elsie Sayre, MD  methocarbamol  (ROBAXIN ) 500 MG tablet Take 1 tablet (500 mg total) by mouth every 8 (eight) hours as needed for muscle spasms. 12/13/23   Berneta Elsie Sayre, MD  mupirocin  ointment (BACTROBAN ) 2 % Apply 1 Application topically 2 (two) times daily. 02/17/24   Hyatt, Max T, DPM  nitroGLYCERIN  (NITROSTAT ) 0.4 MG SL tablet Place 1 tablet (0.4 mg total) under the tongue every 5 (five) minutes as needed for chest pain. 12/13/23   Berneta Elsie Sayre, MD  pantoprazole  (PROTONIX ) 40 MG tablet Take 1 tablet (40 mg total) by mouth daily. 06/13/24   Berneta Elsie Sayre, MD  pramipexole  (MIRAPEX ) 1 MG tablet Take 1 tablet (1 mg total) by mouth at bedtime. 06/13/24   Berneta Elsie Sayre, MD  sulfamethoxazole -trimethoprim  (BACTRIM  DS) 800-160 MG tablet Take 1 tablet by mouth 2 (two) times daily. 02/17/24   Hyatt, Max T, DPM  traMADol  (ULTRAM ) 50 MG tablet Take 1-2 tablets (  50-100 mg total) by mouth every 6 (six) hours as needed for moderate pain (pain score 4-6). 01/18/24   Eletha Boas, MD  Vitamin D , Ergocalciferol , (DRISDOL ) 1.25 MG (50000 UNIT) CAPS capsule Take 1 capsule (50,000 Units total) by mouth every 7 (seven) days. 04/27/24   Berneta Elsie Sayre, MD    Allergies: Keflex [cephalexin]    Review of Systems  Cardiovascular:  Positive for chest pain.  All other systems reviewed and are negative.   Updated Vital Signs BP (!) 136/114   Pulse (!) 127   Temp (!) 97.3 F (36.3 C)   Resp (!) 22   Ht 6' 3 (1.905 m)   Wt 97.5 kg    SpO2 97%   BMI 26.87 kg/m   Physical Exam Vitals and nursing note reviewed.  Constitutional:      Appearance: He is well-developed.  HENT:     Head: Normocephalic and atraumatic.  Eyes:     Conjunctiva/sclera: Conjunctivae normal.     Pupils: Pupils are equal, round, and reactive to light.  Cardiovascular:     Rate and Rhythm: Tachycardia present. Rhythm irregularly irregular.     Heart sounds: Normal heart sounds.     Comments: AFIB 130's-140's during exam Pulmonary:     Effort: Pulmonary effort is normal.     Breath sounds: Normal breath sounds.  Abdominal:     General: Bowel sounds are normal.     Palpations: Abdomen is soft.  Musculoskeletal:        General: Normal range of motion.     Cervical back: Normal range of motion.  Skin:    General: Skin is warm and dry.  Neurological:     Mental Status: He is alert and oriented to person, place, and time.     (all labs ordered are listed, but only abnormal results are displayed) Labs Reviewed  BASIC METABOLIC PANEL WITH GFR - Abnormal; Notable for the following components:      Result Value   Glucose, Bld 153 (*)    BUN 27 (*)    Creatinine, Ser 1.28 (*)    GFR, Estimated 55 (*)    All other components within normal limits  CBC  T4, FREE  TSH  TROPONIN I (HIGH SENSITIVITY)  TROPONIN I (HIGH SENSITIVITY)    EKG: EKG Interpretation Date/Time:  Monday August 21 2024 21:35:50 EST Ventricular Rate:  141 PR Interval:    QRS Duration:  104 QT Interval:  334 QTC Calculation: 511 R Axis:   75  Text Interpretation: afib with RVR Possible Inferior infarct , age undetermined Cannot rule out Anterior infarct , age undetermined Abnormal ECG When compared with ECG of 09-Mar-2023 10:44, PREVIOUS ECG IS PRESENT Confirmed by Cleotilde Rogue ([id number]) on 08/21/2024 9:59:28 PM  Radiology: [facility] Chest 2 View Result Date: 08/21/2024 EXAM: 2 VIEW(S) XRAY OF THE CHEST 08/21/2024 09:50:00 PM COMPARISON: None available. CLINICAL  HISTORY: Chest pain. FINDINGS: LUNGS AND PLEURA: Linear opacity at the left lung base. No pleural effusion. No pneumothorax. HEART AND MEDIASTINUM: Left coronary artery stent noted. No acute abnormality of the cardiac and mediastinal silhouettes. BONES AND SOFT TISSUES: No acute osseous abnormality. IMPRESSION: 1. Linear opacity at the left lung base, compatible with atelectasis or scarring. Electronically signed by: Oneil Devonshire MD 08/21/2024 10:02 PM EST RP Workstation: [id number]     Procedures   CRITICAL CARE Performed by: Olam CHRISTELLA Slocumb   Total critical care time: 45 minutes  Critical care time was exclusive of separately billable procedures  and treating other patients.  Critical care was necessary to treat or prevent imminent or life-threatening deterioration.  Critical care was time spent personally by me on the following activities: development of treatment plan with patient and/or surrogate as well as nursing, discussions with consultants, evaluation of patient's response to treatment, examination of patient, obtaining history from patient or surrogate, ordering and performing treatments and interventions, ordering and review of laboratory studies, ordering and review of radiographic studies, pulse oximetry and re-evaluation of patient's condition.   Medications Ordered in the ED  diltiazem  (CARDIZEM ) 1 mg/mL load via infusion 10 mg (has no administration in time range)    And  diltiazem  (CARDIZEM ) 125 mg in dextrose  5% 125 mL (1 mg/mL) infusion (has no administration in time range)                                    Medical Decision Making Amount and/or Complexity of Data Reviewed Labs: ordered. Radiology: ordered and independent interpretation performed. ECG/medicine tests: ordered and independent interpretation performed.  Risk Prescription drug management. Decision regarding hospitalization.   88 year old male presenting to the ED with chest pain.  Was visiting  his wife who is inpatient upstairs and had sudden onset of chest pain, some radiation into the left arm and jaw.  Does have prior hx of CAD, has been followed by Dr. Jeffrie.  Found to be in AFIB w/RVR on arrival to ED, rate 150's.  He is afebrile, non-toxic in appearance here.  Remains in AFIB with rate 130's-140's during my exam.  He denies any active chest pain.  CXR is clear.  Will start cardizem  bolus and drip.  Labs are pending.  11:31 PM Rate has downtrended, now in the 110's.  He reports he is currently asymptomatic.  Will need admission for AFIB.  TSH/T4 are pending but remainder of labs are reassuring without electrolyte derangement.  Troponin is negative.  He will require admission for ongoing care.  Discussed with hospitalist, Dr. Lou-- will admit for ongoing care.  Requested to notify cardiology for AM consult-- secure chat message sent to fellow (Dr. Gail) to add to AM consult list.  Final diagnoses:  Atrial fibrillation with rapid ventricular response [facility])    ED Discharge Orders     None          Jarold Olam HERO, PA-C 08/21/24 2358    Cleotilde Rogue, MD 08/22/24 1037

## 2024-08-21 NOTE — ED Triage Notes (Signed)
 Quick triage note: Pt to ED from upstairs, pt was visiting his wife when he had sudden onset chest pain .  Reports cardiac hx. Denies N/V/SHOB.

## 2024-08-22 ENCOUNTER — Other Ambulatory Visit (HOSPITAL_COMMUNITY): Payer: Self-pay

## 2024-08-22 ENCOUNTER — Inpatient Hospital Stay (HOSPITAL_COMMUNITY)

## 2024-08-22 ENCOUNTER — Telehealth (HOSPITAL_COMMUNITY): Payer: Self-pay

## 2024-08-22 ENCOUNTER — Encounter (HOSPITAL_COMMUNITY): Admission: EM | Disposition: A | Payer: Self-pay | Source: Home / Self Care | Attending: Internal Medicine

## 2024-08-22 ENCOUNTER — Inpatient Hospital Stay (HOSPITAL_COMMUNITY): Admitting: Anesthesiology

## 2024-08-22 DIAGNOSIS — Z8639 Personal history of other endocrine, nutritional and metabolic disease: Secondary | ICD-10-CM

## 2024-08-22 DIAGNOSIS — I4891 Unspecified atrial fibrillation: Principal | ICD-10-CM | POA: Diagnosis present

## 2024-08-22 LAB — BASIC METABOLIC PANEL WITH GFR
Anion gap: 8 (ref 5–15)
BUN: 23 mg/dL (ref 8–23)
CO2: 26 mmol/L (ref 22–32)
Calcium: 8.8 mg/dL — ABNORMAL LOW (ref 8.9–10.3)
Chloride: 105 mmol/L (ref 98–111)
Creatinine, Ser: 1.1 mg/dL (ref 0.61–1.24)
GFR, Estimated: >60 mL/min (ref >60)
Glucose, Bld: 107 mg/dL — ABNORMAL HIGH (ref 70–99)
Potassium: 3.7 mmol/L (ref 3.5–5.1)
Sodium: 139 mmol/L (ref 135–145)

## 2024-08-22 LAB — TROPONIN I (HIGH SENSITIVITY): Troponin I (High Sensitivity): 7 ng/L (ref <18)

## 2024-08-22 LAB — ECHOCARDIOGRAM COMPLETE
Height: 75 in
MV VTI: 1.38 cm2
S' Lateral: 2.1 cm
Weight: 3440 [oz_av]

## 2024-08-22 LAB — HEPARIN LEVEL (UNFRACTIONATED): Heparin Unfractionated: 0.68 [IU]/mL (ref 0.30–0.70)

## 2024-08-22 LAB — MAGNESIUM: Magnesium: 2.1 mg/dL (ref 1.7–2.4)

## 2024-08-22 LAB — TSH: TSH: 1.761 u[IU]/mL (ref 0.350–4.500)

## 2024-08-22 LAB — T4, FREE: Free T4: 0.81 ng/dL (ref 0.61–1.12)

## 2024-08-22 LAB — BRAIN NATRIURETIC PEPTIDE: B Natriuretic Peptide: 169.3 pg/mL — ABNORMAL HIGH (ref 0.0–100.0)

## 2024-08-22 SURGERY — TRANSESOPHAGEAL ECHOCARDIOGRAM (TEE) (CATHLAB)
Anesthesia: Monitor Anesthesia Care

## 2024-08-22 MED ORDER — PERFLUTREN LIPID MICROSPHERE
1.0000 mL | INTRAVENOUS | Status: AC | PRN
  Administered 2024-08-22: 11:00:00 3 mL via INTRAVENOUS

## 2024-08-22 MED ORDER — ATORVASTATIN CALCIUM 40 MG PO TABS
40.0000 mg | ORAL_TABLET | Freq: Every day | ORAL | Status: DC
  Administered 2024-08-22 – 2024-08-23 (×2): 40 mg via ORAL
  Filled 2024-08-22 (×2): qty 1

## 2024-08-22 MED ORDER — HEPARIN BOLUS VIA INFUSION
4000.0000 [IU] | Freq: Once | INTRAVENOUS | Status: AC
  Administered 2024-08-22: 03:00:00 4000 [IU] via INTRAVENOUS
  Filled 2024-08-22: qty 4000

## 2024-08-22 MED ORDER — VITAMIN B-12 1000 MCG PO TABS
1000.0000 ug | ORAL_TABLET | Freq: Every day | ORAL | Status: DC
  Administered 2024-08-22 – 2024-08-23 (×2): 1000 ug via ORAL
  Filled 2024-08-22 (×2): qty 1

## 2024-08-22 MED ORDER — SENNOSIDES-DOCUSATE SODIUM 8.6-50 MG PO TABS: 1.0000 | ORAL_TABLET | Freq: Every evening | ORAL | Status: DC | PRN

## 2024-08-22 MED ORDER — SODIUM CHLORIDE 0.9 % IV SOLN: INTRAVENOUS | Status: DC

## 2024-08-22 MED ORDER — HEPARIN BOLUS VIA INFUSION
4000.0000 [IU] | Freq: Once | INTRAVENOUS | Status: DC
  Filled 2024-08-22: qty 4000

## 2024-08-22 MED ORDER — PANTOPRAZOLE SODIUM 40 MG PO TBEC
40.0000 mg | DELAYED_RELEASE_TABLET | Freq: Every day | ORAL | Status: DC
  Administered 2024-08-22: 21:00:00 40 mg via ORAL
  Filled 2024-08-22: qty 1

## 2024-08-22 MED ORDER — NITROGLYCERIN 0.4 MG SL SUBL: 0.4000 mg | SUBLINGUAL_TABLET | SUBLINGUAL | Status: DC | PRN

## 2024-08-22 MED ORDER — HEPARIN (PORCINE) 25000 UT/250ML-% IV SOLN
1350.0000 [IU]/h | INTRAVENOUS | Status: DC
  Filled 2024-08-22: qty 250

## 2024-08-22 MED ORDER — ONDANSETRON HCL 4 MG/2ML IJ SOLN: 4.0000 mg | Freq: Four times a day (QID) | INTRAMUSCULAR | Status: DC | PRN

## 2024-08-22 MED ORDER — ACETAMINOPHEN 650 MG RE SUPP: 650.0000 mg | Freq: Four times a day (QID) | RECTAL | Status: DC | PRN

## 2024-08-22 MED ORDER — APIXABAN 5 MG PO TABS
5.0000 mg | ORAL_TABLET | Freq: Two times a day (BID) | ORAL | Status: DC
  Administered 2024-08-22 – 2024-08-23 (×3): 5 mg via ORAL
  Filled 2024-08-22 (×3): qty 1

## 2024-08-22 MED ORDER — HYDROXYCHLOROQUINE SULFATE 200 MG PO TABS
200.0000 mg | ORAL_TABLET | Freq: Two times a day (BID) | ORAL | Status: DC
  Administered 2024-08-22 – 2024-08-23 (×2): 200 mg via ORAL
  Filled 2024-08-22 (×3): qty 1

## 2024-08-22 MED ORDER — ACETAMINOPHEN 325 MG PO TABS: 650.0000 mg | ORAL_TABLET | Freq: Four times a day (QID) | ORAL | Status: DC | PRN

## 2024-08-22 MED ORDER — BISACODYL 5 MG PO TBEC: 5.0000 mg | DELAYED_RELEASE_TABLET | Freq: Every day | ORAL | Status: DC | PRN

## 2024-08-22 MED ORDER — HEPARIN (PORCINE) 25000 UT/250ML-% IV SOLN
1350.0000 [IU]/h | INTRAVENOUS | Status: DC
  Administered 2024-08-22: 03:00:00 1450 [IU]/h via INTRAVENOUS
  Filled 2024-08-22: qty 250

## 2024-08-22 MED ORDER — PRAMIPEXOLE DIHYDROCHLORIDE 1 MG PO TABS
1.0000 mg | ORAL_TABLET | Freq: Every day | ORAL | Status: DC
  Administered 2024-08-22: 21:00:00 1 mg via ORAL
  Filled 2024-08-22 (×2): qty 1

## 2024-08-22 MED ORDER — ONDANSETRON HCL 4 MG PO TABS: 4.0000 mg | ORAL_TABLET | Freq: Four times a day (QID) | ORAL | Status: DC | PRN

## 2024-08-22 NOTE — Progress Notes (Signed)
 PHARMACY - ANTICOAGULATION CONSULT NOTE  Pharmacy Consult for Heparin  Indication: atrial fibrillation  Allergies[1]  Patient Measurements: Height: 6' 3 (190.5 cm) Weight: 97.5 kg (215 lb) IBW/kg (Calculated) : 84.5 HEPARIN  DW (KG): 97.5  Vital Signs: Temp: 97.3 F (36.3 C) (12/15 2119) BP: 121/87 (12/15 2315) Pulse Rate: 61 (12/15 2315)  Labs: Recent Labs    08/21/24 2201  HGB 14.4  HCT 43.7  PLT 281  CREATININE 1.28*  TROPONINIHS 4    Estimated Creatinine Clearance: 49.5 mL/min (A) (by C-G formula based on SCr of 1.28 mg/dL (H)).   Medical History: Past Medical History:  Diagnosis Date   BACK PAIN, LUMBAR 10/18/2009   Benign paroxysmal positional vertigo 09/30/2007   CAD (coronary artery disease)    Cancer (HCC)    prostate    CHF (congestive heart failure) (HCC)    CKD (chronic kidney disease)    COLONIC POLYPS, ADENOMATOUS, HX OF    GERD (gastroesophageal reflux disease)    Headache    Herpes zoster with other nervous system complications(053.19)    Hyperlipidemia    Hypertension    Hyperthyroidism    pt denies   Loss of hearing    Bil/has hearing aids in both ears   Nerve damage of left foot    PERIPHERAL NEUROPATHY 10/11/2007   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 male with Afib for heparin  Goal of Therapy:  Heparin  level 0.3-0.7 units/ml Monitor platelets by anticoagulation protocol: Yes   Plan:  Heparin  4000 units IV bolus, then start heparin  1450 units/hr Check heparin  level in 8 hours.   Ronnell Makarewicz, Cordella Misty 08/22/2024,12:30 AM      [1]  Allergies Allergen Reactions   Keflex [Cephalexin] Hives   [2]  Current Facility-Administered Medications on File Prior to Encounter  Medication Dose Route Frequency Provider Last Rate Last Admin   ipratropium-albuterol  (DUONEB) 0.5-2.5 (3) MG/3ML nebulizer solution 3 mL  3 mL Nebulization Once Nafziger, Darleene, NP       Current Outpatient Medications on File Prior to Encounter  Medication Sig Dispense Refill   amLODipine  (NORVASC ) 10 MG tablet TAKE 1 TABLET BY MOUTH EVERY DAY 30 tablet 0   atorvastatin  (LIPITOR) 40 MG tablet TAKE 1 TABLET DAILY 90 tablet 1   carvedilol  (COREG ) 12.5 MG tablet TAKE 1 TABLET DAILY 30 tablet 0   clopidogrel  (PLAVIX ) 75 MG tablet TAKE 1 TABLET DAILY 90 tablet 1   cyanocobalamin  (VITAMIN B12) 1000 MCG tablet Take 1 tablet (1,000 mcg total) by mouth daily. 90 tablet 1   cyanocobalamin  (VITAMIN B12) 1000 MCG tablet Take 1 tablet (1,000 mcg total) by mouth daily. 90 tablet 1   dapagliflozin  propanediol (FARXIGA ) 10 MG TABS tablet TAKE 1 TABLET DAILY BEFORE BREAKFAST 90 tablet 1   fenofibrate  (TRICOR ) 145 MG tablet Take 1 tablet (145 mg total) by mouth daily. 90 tablet 0   hydrochlorothiazide  (HYDRODIURIL ) 25 MG tablet Take 1 tablet (25 mg total) by mouth daily. 90 tablet 3   isosorbide  mononitrate (IMDUR ) 60 MG 24 hr tablet TAKE 1 TABLET BY MOUTH EVERY DAY 90 tablet 1   ketoconazole  (NIZORAL ) 2 % cream Apply a thin coat  to feet including the interdigital spaces daily for 14 days. 60 g 0   levothyroxine  (SYNTHROID ) 25 MCG tablet Take 1 tablet (25 mcg total) by mouth daily. 90 tablet 0   methocarbamol  (ROBAXIN ) 500 MG tablet Take 1 tablet (500 mg total) by mouth every 8 (eight) hours as needed for muscle spasms. 30 tablet 0   mupirocin  ointment (BACTROBAN ) 2 % Apply 1 Application topically 2 (two) times daily. 22 g 0   nitroGLYCERIN  (NITROSTAT ) 0.4 MG SL tablet Place 1 tablet (0.4 mg total) under the tongue every 5 (five) minutes as needed for chest pain. 25 tablet 6   pantoprazole  (PROTONIX ) 40 MG tablet Take 1 tablet (40 mg  total) by mouth daily. 90 tablet 1   pramipexole  (MIRAPEX ) 1 MG tablet Take 1 tablet (1 mg total) by mouth at bedtime. 90 tablet 1   sulfamethoxazole -trimethoprim  (BACTRIM  DS) 800-160 MG tablet Take 1 tablet by mouth 2 (two) times daily. 20 tablet 1   traMADol  (ULTRAM ) 50 MG tablet Take 1-2 tablets (50-100 mg total) by mouth every 6 (six) hours as needed for moderate pain (pain score 4-6). 12 tablet 0   Vitamin D , Ergocalciferol , (DRISDOL ) 1.25 MG (50000 UNIT) CAPS capsule Take 1 capsule (50,000 Units total) by mouth every 7 (seven) days. 12 capsule 1

## 2024-08-22 NOTE — Consult Note (Signed)
 Cardiology Consultation   Patient ID: Kristopher Wilson MRN: [id number]; DOB: 1936/05/28  Admit date: 08/21/2024 Date of Consult: 08/22/2024  PCP:  Berneta Elsie Sayre, MD   [facility] Providers Cardiologist:  Oneil Parchment, MD        Patient Profile: Kristopher Wilson is a 88 y.o. male with a hx of coronary artery disease post stent, hypertension, and CHF who is being seen 08/22/2024 for the evaluation of atrial fibrillation at the request of the Hosptitalist Service.  History of Present Illness: Kristopher Wilson states he was in his usual state of health.  He was at [facility] visiting his wife, who is currently in being admitted on the sixth floor.  Around 8 PM on the day of admission, he developed acute onset chest pain that radiated down the shoulder.  This prompted an ER visit.  In the ER, patient was found to be in atrial fibrillation with rapid ventricular rates.  He was started on IV diltiazem  drip and his chest pain resolved.  Review systems negative for shortness of breath, syncope, potation's.  Has no prior history of atrial fibrillation.  He states he has not previously endorsed the symptoms prior to today.  He has a history of coronary artery disease.  Patient had 2 stents placed, 1 in the LAD, another in RPL branch that occurred at [facility] over a decade ago.  States he was told to remain on aspirin  and Plavix , however he does not adhere to aspirin .  He currently follows up with Dr. Verdie.  Had a pharmacological nuclear medicine stress test in 2022 for chest pain, which showed an EF of 58% with no evidence of ischemia or infarct.  Also has a history of hypertension states he has previously been admitted for bilateral leg swelling.Kristopher Wilson  He has never had a stroke before.  Patient has no family history of atrial fibrillation, however his wife has atrial fibrillation. He has no extensive history of bleeding   Past Medical History:  Diagnosis Date   BACK PAIN, LUMBAR  10/18/2009   Benign paroxysmal positional vertigo 09/30/2007   CAD (coronary artery disease)    Cancer (HCC)    prostate    CHF (congestive heart failure) (HCC)    CKD (chronic kidney disease)    COLONIC POLYPS, ADENOMATOUS, HX OF    GERD (gastroesophageal reflux disease)    Headache    Herpes zoster with other nervous system complications(053.19)    Hyperlipidemia    Hypertension    Hyperthyroidism    pt denies   Loss of hearing    Bil/has hearing aids in both ears   Nerve damage of left foot    PERIPHERAL NEUROPATHY 10/11/2007   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   Pre-diabetes    PROSTATE CANCER, HX OF 07/07/2007   s/p prostatectomy   RLS (restless legs syndrome)    S/P skin biopsy 06/12/2019   Ulcer    ULCERATIVE COLITIS, LEFT SIDED 2000   URTICARIA, ALLERGIC 08/05/2009   VENOUS INSUFFICIENCY 07/28/2010    Past Surgical History:  Procedure Laterality Date   ANGIOPLASTY  1990's/2004   stent placement 2   COLONOSCOPY W/ BIOPSIES AND POLYPECTOMY  06/06/2009   adenomatous polyp, diverticulosis, colitis   ENDOVENOUS ABLATION SAPHENOUS VEIN W/ LASER Left 07/09/2021   endovenous laser ablation left greater saphenous vein and stab phlebectomy 10-20 incisions left leg by Medford Blade MD   INGUINAL HERNIA REPAIR     x x   ORIF FIBULA FRACTURE Left 03/02/2017  Procedure: OPEN REDUCTION INTERNAL FIXATION (ORIF) LEFT DISTAL FIBULA FRACTURE WITH DELTOID LIGAMENT REPAIR;  Surgeon: Anderson Maude ORN, MD;  Location: [facility] OR;  Service: Orthopedics;  Laterality: Left;   PROSTATECTOMY     RETINAL LASER PROCEDURE Right 2016 & 2017    ROTATOR CUFF REPAIR Left    SKIN BIOPSY  02/26/2020   Left Dorsum Bridge of Nose and Right Upper Abdomen   THYROID  LOBECTOMY Left 01/17/2024   Procedure: LOBECTOMY, THYROID ;  Surgeon: Eletha Boas, MD;  Location: [facility] ORS;  Service: General;  Laterality: Left;   TONSILLECTOMY       Home Medications:  Prior to Admission medications  Medication Sig Start  Date End Date Taking? Authorizing Provider  amLODipine  (NORVASC ) 10 MG tablet TAKE 1 TABLET BY MOUTH EVERY DAY 06/27/24   Jeffrie Oneil BROCKS, MD  atorvastatin  (LIPITOR) 40 MG tablet TAKE 1 TABLET DAILY 10/13/23   Jeffrie Oneil BROCKS, MD  carvedilol  (COREG ) 12.5 MG tablet TAKE 1 TABLET DAILY 07/27/24   Jeffrie Oneil BROCKS, MD  clopidogrel  (PLAVIX ) 75 MG tablet TAKE 1 TABLET DAILY 09/27/23   Jeffrie Oneil BROCKS, MD  cyanocobalamin  (VITAMIN B12) 1000 MCG tablet Take 1 tablet (1,000 mcg total) by mouth daily. 04/27/24   Berneta Elsie Sayre, MD  cyanocobalamin  (VITAMIN B12) 1000 MCG tablet Take 1 tablet (1,000 mcg total) by mouth daily. 06/13/24   Berneta Elsie Sayre, MD  dapagliflozin  propanediol (FARXIGA ) 10 MG TABS tablet TAKE 1 TABLET DAILY BEFORE BREAKFAST 06/06/24   Berneta Elsie Sayre, MD  fenofibrate  (TRICOR ) 145 MG tablet Take 1 tablet (145 mg total) by mouth daily. 02/10/24   Jeffrie Oneil BROCKS, MD  hydrochlorothiazide  (HYDRODIURIL ) 25 MG tablet Take 1 tablet (25 mg total) by mouth daily. 01/28/24   Lelon Hamilton T, PA-C  isosorbide  mononitrate (IMDUR ) 60 MG 24 hr tablet TAKE 1 TABLET BY MOUTH EVERY DAY 03/01/23   Jeffrie Oneil BROCKS, MD  ketoconazole  (NIZORAL ) 2 % cream Apply a thin coat to feet including the interdigital spaces daily for 14 days. 06/13/24   Berneta Elsie Sayre, MD  levothyroxine  (SYNTHROID ) 25 MCG tablet Take 1 tablet (25 mcg total) by mouth daily. 07/20/24   Berneta Elsie Sayre, MD  methocarbamol  (ROBAXIN ) 500 MG tablet Take 1 tablet (500 mg total) by mouth every 8 (eight) hours as needed for muscle spasms. 12/13/23   Berneta Elsie Sayre, MD  mupirocin  ointment (BACTROBAN ) 2 % Apply 1 Application topically 2 (two) times daily. 02/17/24   Hyatt, Max T, DPM  nitroGLYCERIN  (NITROSTAT ) 0.4 MG SL tablet Place 1 tablet (0.4 mg total) under the tongue every 5 (five) minutes as needed for chest pain. 12/13/23   Berneta Elsie Sayre, MD  pantoprazole  (PROTONIX ) 40 MG tablet Take 1 tablet (40 mg total) by  mouth daily. 06/13/24   Berneta Elsie Sayre, MD  pramipexole  (MIRAPEX ) 1 MG tablet Take 1 tablet (1 mg total) by mouth at bedtime. 06/13/24   Berneta Elsie Sayre, MD  sulfamethoxazole -trimethoprim  (BACTRIM  DS) 800-160 MG tablet Take 1 tablet by mouth 2 (two) times daily. 02/17/24   Hyatt, Max T, DPM  traMADol  (ULTRAM ) 50 MG tablet Take 1-2 tablets (50-100 mg total) by mouth every 6 (six) hours as needed for moderate pain (pain score 4-6). 01/18/24   Eletha Boas, MD  Vitamin D , Ergocalciferol , (DRISDOL ) 1.25 MG (50000 UNIT) CAPS capsule Take 1 capsule (50,000 Units total) by mouth every 7 (seven) days. 04/27/24   Berneta Elsie Sayre, MD    Scheduled Meds:  ipratropium-albuterol   3 mL  Nebulization Once   Continuous Infusions:  diltiazem  (CARDIZEM ) infusion 10 mg/hr (08/22/24 0007)   PRN Meds:   Allergies:   Allergies[1]  Social History:   Social History   Socioeconomic History   Marital status: Married    Spouse name: Lonell   Number of children: 3   Years of education: Not on file   Highest education level: Some college, no degree  Occupational History   Occupation: retired    Associate Professor: RETIRED   Occupation: [employer]  Tobacco Use   Smoking status: Former    Current packs/day: 0.00    Types: Cigarettes    Quit date: 09/08/1975    Years since quitting: 48.9   Smokeless tobacco: Never  Vaping Use   Vaping status: Never Used  Substance and Sexual Activity   Alcohol use: Not Currently    Alcohol/week: 14.0 standard drinks of alcohol    Types: 14 Shots of liquor per week   Drug use: No   Sexual activity: Not Currently  Other Topics Concern   Not on file  Social History Narrative   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    Married    Two children, step son. All live in [place] area   Works part time at [employer]   Social Drivers of Health   Tobacco Use: Medium Risk (08/21/2024)   Patient History    Smoking Tobacco Use: Former     Smokeless Tobacco Use: Never    Passive Exposure: Not on file  Financial Resource Strain: Patient Declined (07/19/2024)   Overall Financial Resource Strain (CARDIA)    Difficulty of Paying Living Expenses: Patient declined  Food Insecurity: Patient Declined (07/19/2024)   Epic    Worried About Programme Researcher, Broadcasting/film/video in the Last Year: Patient declined    Barista in the Last Year: Patient declined  Transportation Needs: Patient Declined (07/19/2024)   Epic    Lack of Transportation (Medical): Patient declined    Lack of Transportation (Non-Medical): Patient declined  Physical Activity: Sufficiently Active (07/19/2024)   Exercise Vital Sign    Days of Exercise per Week: 3 days    Minutes of Exercise per Session: 50 min  Stress: No Stress Concern Present (07/19/2024)   [employer] of Occupational Health - Occupational Stress Questionnaire    Feeling of Stress: Not at all  Social Connections: Unknown (07/19/2024)   Social Connection and Isolation Panel    Frequency of Communication with Friends and Family: Patient declined    Frequency of Social Gatherings with Friends and Family: Patient declined    Attends Religious Services: Patient declined    Active Member of Clubs or Organizations: Yes    Attends Banker Meetings: Patient declined    Marital Status: Married  Catering Manager Violence: Not At Risk (01/17/2024)   Humiliation, Afraid, Rape, and Kick questionnaire    Fear of Current or Ex-Partner: No    Emotionally Abused: No    Physically Abused: No    Sexually Abused: No  Depression (PHQ2-9): Low Risk (07/20/2024)   Depression (PHQ2-9)    PHQ-2 Score: 0  Alcohol Screen: Low Risk (07/19/2024)   Alcohol Screen    Last Alcohol Screening Score (AUDIT): 5  Housing: Patient Declined (07/19/2024)   Epic    Unable to Pay for Housing in the Last Year: Patient declined    Number of Times Moved in the Last Year: Not on file  Homeless in the Last Year: Patient  declined  Utilities: Not At Risk (01/17/2024)   A[facility] Utilities    Threatened with loss of utilities: No  Health Literacy: Not on file    Family History:    Family History  Problem Relation Age of Onset   Heart attack Father    Throat cancer Brother    Multiple sclerosis Brother    Multiple sclerosis Son    Diabetes Maternal Uncle        x 2   Diabetes Maternal Uncle    Colon cancer Neg Hx    Stroke Neg Hx      ROS:  Please see the history of present illness.   All other ROS reviewed and negative.     Physical Exam/Data: Vitals:   08/21/24 2119 08/21/24 2200 08/21/24 2207 08/21/24 2315  BP: 118/77 (!) 136/114  121/87  Pulse: (!) 126 (!) 127  61  Resp: (!) 23 (!) 22  (!) 24  Temp: (!) 97.3 F (36.3 C)     SpO2: 95% 97%  100%  Weight:   97.5 kg   Height:   6' 3 (1.905 m)    No intake or output data in the 24 hours ending 08/22/24 0021    08/21/2024   10:07 PM 08/10/2024   10:16 AM 07/20/2024   10:34 AM  Last 3 Weights  Weight (lbs) 215 lb 221 lb 221 lb  Weight (kg) 97.523 kg 100.245 kg 100.245 kg     Body mass index is 26.87 kg/m.  General:  Well nourished, well developed, in no acute distress HEENT: normal Neck: no JVD Vascular: No carotid bruits; Distal pulses 2+ bilaterally Cardiac:  irregular rate, rhythm Lungs:  clear to auscultation bilaterally, no wheezing, rhonchi or rales  Abd: soft, nontender, no hepatomegaly  Ext: no edema Musculoskeletal:  No deformities, BUE and BLE strength normal and equal Skin: warm and dry  Neuro:  CNs 2-12 intact, no focal abnormalities noted Psych:  Normal affect   EKG:  The EKG was personally reviewed and demonstrates:  AF with RVR Telemetry:  Telemetry was personally reviewed and demonstrates:  AF with RVR  Relevant CV Studies:  reviewed  Laboratory Data: High Sensitivity Troponin:   Recent Labs  Lab 08/21/24 2201  TROPONINIHS 4   No results for input(s): TRNPT in the last 720 hours.    Chemistry Recent  Labs  Lab 08/21/24 2201  NA 136  K 3.9  CL 101  CO2 24  GLUCOSE 153*  BUN 27*  CREATININE 1.28*  CALCIUM  9.1  GFRNONAA 55*  ANIONGAP 11    No results for input(s): PROT, ALBUMIN, AST, ALT, ALKPHOS, BILITOT in the last 168 hours. Lipids No results for input(s): CHOL, TRIG, HDL, LABVLDL, LDLCALC, CHOLHDL in the last 168 hours.  Hematology Recent Labs  Lab 08/21/24 2201  WBC 10.5  RBC 4.95  HGB 14.4  HCT 43.7  MCV 88.3  MCH 29.1  MCHC 33.0  RDW 14.9  PLT 281   Thyroid   Recent Labs  Lab 08/21/24 2201  TSH 1.761  FREET4 0.81    BNPNo results for input(s): BNP, PROBNP in the last 168 hours.  DDimer No results for input(s): DDIMER in the last 168 hours.  Radiology/Studies:  DG Chest 2 View Result Date: 08/21/2024 EXAM: 2 VIEW(S) XRAY OF THE CHEST 08/21/2024 09:50:00 PM COMPARISON: None available. CLINICAL HISTORY: Chest pain. FINDINGS: LUNGS AND PLEURA: Linear opacity at the left lung base. No pleural effusion. No pneumothorax.  HEART AND MEDIASTINUM: Left coronary artery stent noted. No acute abnormality of the cardiac and mediastinal silhouettes. BONES AND SOFT TISSUES: No acute osseous abnormality. IMPRESSION: 1. Linear opacity at the left lung base, compatible with atelectasis or scarring. Electronically signed by: Oneil Devonshire MD 08/21/2024 10:02 PM EST RP Workstation: [id number]     Assessment and Plan:  AIDAN MOTEN is a 88 y.o. male with a hx of coronary artery disease post stent, hypertension, and CHF who is being seen 08/22/2024 for atrial fibrillation with RVR.  Patient is currently chest pain free.  He has ECG evidence of atrial fibrillation.  Currently rate controlled with IV diltiazem .  His CHADS2 Vasc score is 6 (CHF, HTN, CAD, TDM-II, age x 2).  Based AF burden include advanced age. I suspect his symptoms were related to atrial fibrillation, however he does have features that overlap with coronary artery disease.  Reassuringly  he has no evidence of myocardial injury nor does he have evidence of ischemia on ECG.  His most recent stress test was in 2022.  Consider how he feels once he is back in sinus rhythm with electrical cardioversion.  He will need to eventually be transition to a DOAC reduction of stroke.  Also consider further workup of ischemia with coronary CT, however I would first prioritize treatment of atrial fibrillation.  #Recommendations -continue IV diltiazem  -start IV heparin  -NPO for TEE cardioversion -sleep study evaluation as outpatient -goal K>4, Mg>2 -TTE  Risk Assessment/Risk Scores:         CHA2DS2-VASc Score =     This indicates a  % annual risk of stroke. The patient's score is based upon:          For questions or updates, please contact  [facility] Please consult [email or web address] for contact info under      Signed, Devante Capano A Nickole Adamek, MD  08/22/2024 12:21 AM      [1]  Allergies Allergen Reactions   Keflex [Cephalexin] Hives

## 2024-08-22 NOTE — Progress Notes (Signed)
 Noticed Heparin  gtt not infusing and pt has had 1 dose of Eliquis .  Dr Jeffrie notified.  TEE/CV cancelled for today.  Pharmacy notified.

## 2024-08-22 NOTE — Progress Notes (Signed)
 Echocardiogram 2D Echocardiogram has been performed.  Kristopher Wilson 08/22/2024, 11:26 AM

## 2024-08-22 NOTE — Anesthesia Preprocedure Evaluation (Addendum)
 Anesthesia Evaluation  Patient identified by MRN, date of birth, ID band Patient awake    Reviewed: Allergy & Precautions, NPO status , Patient's Chart, lab work & pertinent test results  History of Anesthesia Complications Negative for: history of anesthetic complications  Airway Mallampati: III  TM Distance: >3 FB Neck ROM: Full   Comment: Previous grade I view with Miller 3, easy mask Dental  (+) Dental Advisory Given,    Pulmonary neg shortness of breath, neg sleep apnea, neg COPD, neg recent URI, former smoker   Pulmonary exam normal breath sounds clear to auscultation       Cardiovascular hypertension (amlodipine , carvedilol , HCTZ, ISMN), Pt. on medications (-) angina + CAD, + Cardiac Stents (2 stents 20 years ago) and +CHF  + dysrhythmias Atrial Fibrillation  Rhythm:Irregular Rate:Tachycardia  HLD  Low-risk stress test 05/29/2021   Neuro/Psych  Headaches, neg Seizures BPPV  Neuromuscular disease (lumbosacral radiculopathy, peripheral neuropathy)    GI/Hepatic Neg liver ROS,GERD  Medicated,,UC   Endo/Other  Hypothyroidism  Pre-diabetes  Renal/GU CRFRenal disease   H/o prostate cancer s/p prostatectomy    Musculoskeletal  (+) Arthritis ,  PMR   Abdominal   Peds  Hematology negative hematology ROS (+) Lab Results      Component                Value               Date                      WBC                      10.5                08/21/2024                HGB                      14.4                08/21/2024                HCT                      43.7                08/21/2024                MCV                      88.3                08/21/2024                PLT                      281                 08/21/2024              Anesthesia Other Findings Last Plavix : 08/21/2024  Reproductive/Obstetrics                              Anesthesia Physical Anesthesia Plan  ASA:  3  Anesthesia Plan: MAC   Post-op Pain Management: Minimal or no pain anticipated   Induction:  Intravenous  PONV Risk Score and Plan: 1 and Propofol  infusion, TIVA and Treatment may vary due to age or medical condition  Airway Management Planned: Natural Airway and Nasal Cannula  Additional Equipment:   Intra-op Plan:   Post-operative Plan:   Informed Consent: I have reviewed the patients History and Physical, chart, labs and discussed the procedure including the risks, benefits and alternatives for the proposed anesthesia with the patient or authorized representative who has indicated his/her understanding and acceptance.     Dental advisory given  Plan Discussed with: CRNA and Anesthesiologist  Anesthesia Plan Comments: (Discussed with patient risks of MAC including, but not limited to, minor pain or discomfort, hearing people in the room, and possible need for backup general anesthesia. Risks for general anesthesia also discussed including, but not limited to, sore throat, hoarse voice, chipped/damaged teeth, injury to vocal cords, nausea and vomiting, allergic reactions, lung infection, heart attack, stroke, and death. All questions answered. )         Anesthesia Quick Evaluation

## 2024-08-22 NOTE — H&P (View-Only) (Signed)
°  Progress Note  Patient Name: Kristopher Wilson Date of Encounter: 08/22/2024 [facility] Cardiologist: Oneil Parchment, MD   Interval Summary   Feeling better, no chest pain.  Still in A-fib in the ER.  Vital Signs Vitals:   08/22/24 0425 08/22/24 0430 08/22/24 0515 08/22/24 0837  BP: 99/85 (!) 77/48 107/71 105/75  Pulse: (!) 123 (!) 59 73 (!) 54  Resp: 17 16 16 15   Temp:    97.7 F (36.5 C)  TempSrc:    Oral  SpO2: 98% 96% 95% 99%  Weight:      Height:        Intake/Output Summary (Last 24 hours) at 08/22/2024 0947 Last data filed at 08/22/2024 0933 Gross per 24 hour  Intake 95.96 ml  Output 425 ml  Net -329.04 ml      08/21/2024   10:07 PM 08/10/2024   10:16 AM 07/20/2024   10:34 AM  Last 3 Weights  Weight (lbs) 215 lb 221 lb 221 lb  Weight (kg) 97.523 kg 100.245 kg 100.245 kg      Telemetry/ECG  A-fib heart rate approximately 100- Personally Reviewed  Physical Exam  GEN: No acute distress.   Neck: No JVD Cardiac: Irregularly irregular, no murmurs, rubs, or gallops.  Respiratory: Clear to auscultation bilaterally. GI: Soft, nontender, non-distended  MS: No edema  Assessment & Plan   88 year old male with new onset atrial fibrillation  Paroxysmal atrial fibrillation - Newly discovered.  Plan for TEE cardioversion.  Risk and benefits have been explained.  Willing to proceed. -Currently on IV heparin  as well as IV diltiazem , 7.5. -Post cardioversion we will transition over to Eliquis  5 mg twice a day (starting bridge now with first dose of Eliquis  with IV heparin ) as well as p.o. long-acting diltiazem  if necessary.  According to home meds was taking carvedilol  12.5 mg twice a day at home.  Another possibility would be to increase the carvedilol  to 25 mg twice a day instead of additional diltiazem .  We will see how his heart rate is post cardioversion. -TEE will help assess ejection fraction.  Has not had an echocardiogram in several years.  CAD -  Stable.  Has taken Plavix  at home.  We will likely DC the Plavix  and continue Eliquis  as outpatient to help reduce bleeding risks.  Prior stent to LAD as well as distal RCA branch.  Nuclear stress test 3 years ago showed no ischemia.  Wife is currently in the hospital on the 6 floor after discovering hip fracture rib fracture, pain.  Chest pain - Has resolved.  Likely in part from A-fib RVR.    For questions or updates, please contact [facility] Please consult [email or web address] for contact info under         Signed, Oneil Parchment, MD

## 2024-08-22 NOTE — Telephone Encounter (Signed)
 Pharmacy Patient Advocate Encounter  Insurance verification completed.    The patient is insured through [facility]. Patient has Medicare and is not eligible for a copay card, but may be able to apply for patient assistance or Medicare RX Payment Plan (Patient Must reach out to their plan, if eligible for payment plan), if available.    Ran test claim for Eliquis  5mg  tablet and the current 30 day co-pay is $100.94.   This test claim was processed through [facility]- copay amounts may vary at other pharmacies due to pharmacy/plan contracts, or as the patient moves through the different stages of their insurance plan.

## 2024-08-22 NOTE — Progress Notes (Signed)
 PHARMACY - ANTICOAGULATION CONSULT NOTE  Pharmacy Consult for Heparin  Indication: atrial fibrillation  Allergies[1]  Patient Measurements: Height: 6' 3 (190.5 cm) Weight: 97.5 kg (215 lb) IBW/kg (Calculated) : 84.5 HEPARIN  DW (KG): 97.5  Vital Signs: Temp: 97.7 F (36.5 C) (12/16 0423) Temp Source: Oral (12/16 0423) BP: 107/71 (12/16 0515) Pulse Rate: 73 (12/16 0515)  Labs: Recent Labs    08/21/24 2201 08/22/24 0057 08/22/24 0447 08/22/24 0750  HGB 14.4  --   --   --   HCT 43.7  --   --   --   PLT 281  --   --   --   HEPARINUNFRC  --   --   --  0.68  CREATININE 1.28*  --  1.10  --   TROPONINIHS 4 7  --   --     Estimated Creatinine Clearance: 57.6 mL/min (by C-G formula based on SCr of 1.1 mg/dL).   Medical History: Past Medical History:  Diagnosis Date   BACK PAIN, LUMBAR 10/18/2009   Benign paroxysmal positional vertigo 09/30/2007   CAD (coronary artery disease)    Cancer (HCC)    prostate    CHF (congestive heart failure) (HCC)    CKD (chronic kidney disease)    COLONIC POLYPS, ADENOMATOUS, HX OF    GERD (gastroesophageal reflux disease)    Headache    Herpes zoster with other nervous system complications(053.19)    Hyperlipidemia    Hypertension    Hyperthyroidism    pt denies   Loss of hearing    Bil/has hearing aids in both ears   Nerve damage of left foot    PERIPHERAL NEUROPATHY 10/11/2007   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 male with Afib for heparin  Goal of Therapy:  Heparin  level 0.3-0.7 units/ml Monitor platelets by anticoagulation protocol: Yes  12/16 heparin  level therapeutic on upper end at 0.68.     Plan:  Reduce heparin  infusion to 1350 units/hr  Check confirmatory heparin  level in 8 hours.  F/u cardiology reccs, planning for TEE cardioversion and eventual transition to DOAC   Rankin Sams 08/22/2024,8:29 AM       [1]  Allergies Allergen Reactions   Keflex [Cephalexin] Hives  [2]  Current Facility-Administered Medications on File Prior to Encounter  Medication Dose Route Frequency Provider Last Rate Last Admin   ipratropium-albuterol  (DUONEB) 0.5-2.5 (3) MG/3ML nebulizer solution 3 mL  3 mL Nebulization Once Nafziger, Darleene, NP       Current Outpatient Medications on File Prior to Encounter  Medication Sig Dispense Refill   acetaminophen  (TYLENOL ) 325 MG tablet Take 650 mg by mouth every 6 (six) hours as needed for headache.     amLODipine  (NORVASC ) 10 MG tablet TAKE 1 TABLET BY MOUTH EVERY DAY 30 tablet 0   atorvastatin  (LIPITOR) 40 MG tablet TAKE 1 TABLET DAILY 90 tablet 1   Calcium  Carbonate (CALCIUM  500 PO) Take 1 tablet by mouth in the morning.     carvedilol  (COREG ) 12.5 MG tablet TAKE 1 TABLET DAILY 30 tablet 0   clopidogrel  (PLAVIX ) 75 MG tablet TAKE 1 TABLET DAILY 90 tablet 1   cyanocobalamin  (  VITAMIN B12) 1000 MCG tablet Take 1 tablet (1,000 mcg total) by mouth daily. 90 tablet 1   dapagliflozin  propanediol (FARXIGA ) 10 MG TABS tablet TAKE 1 TABLET DAILY BEFORE BREAKFAST 90 tablet 1   fenofibrate  (TRICOR ) 145 MG tablet Take 1 tablet (145 mg total) by mouth daily. 90 tablet 0   hydrochlorothiazide  (HYDRODIURIL ) 25 MG tablet Take 1 tablet (25 mg total) by mouth daily. 90 tablet 3   hydroxychloroquine  (PLAQUENIL ) 200 MG tablet Take 200 mg by mouth 2 (two) times daily.     isosorbide  mononitrate (IMDUR ) 60 MG 24 hr tablet TAKE 1 TABLET BY MOUTH EVERY DAY 90 tablet 1   ketoconazole  (NIZORAL ) 2 % cream Apply 1 Application topically daily.     nitroGLYCERIN  (NITROSTAT ) 0.4 MG SL tablet Place 1 tablet (0.4 mg total) under the tongue every 5 (five) minutes as needed for  chest pain. 25 tablet 6   pantoprazole  (PROTONIX ) 40 MG tablet Take 1 tablet (40 mg total) by mouth daily. 90 tablet 1   pramipexole  (MIRAPEX ) 1 MG tablet Take 1 tablet (1 mg total) by mouth at bedtime. 90 tablet 1   predniSONE  (DELTASONE ) 5 MG tablet Take 5 mg by mouth daily with breakfast. Patient following a specific course to wean off medication     Vitamin D , Ergocalciferol , (DRISDOL ) 1.25 MG (50000 UNIT) CAPS capsule Take 1 capsule (50,000 Units total) by mouth every 7 (seven) days. (Patient taking differently: Take 50,000 Units by mouth every Monday.) 12 capsule 1   levothyroxine  (SYNTHROID ) 25 MCG tablet Take 1 tablet (25 mcg total) by mouth daily. (Patient not taking: Reported on 08/22/2024) 90 tablet 0   [DISCONTINUED] cyanocobalamin  (VITAMIN B12) 1000 MCG tablet Take 1 tablet (1,000 mcg total) by mouth daily. (Patient not taking: Reported on 08/22/2024) 90 tablet 1   [DISCONTINUED] ketoconazole  (NIZORAL ) 2 % cream Apply a thin coat to feet including the interdigital spaces daily for 14 days. (Patient not taking: Reported on 08/22/2024) 60 g 0   [DISCONTINUED] methocarbamol  (ROBAXIN ) 500 MG tablet Take 1 tablet (500 mg total) by mouth every 8 (eight) hours as needed for muscle spasms. (Patient not taking: Reported on 08/22/2024) 30 tablet 0   [DISCONTINUED] mupirocin  ointment (BACTROBAN ) 2 % Apply 1 Application topically 2 (two) times daily. (Patient not taking: Reported on 08/22/2024) 22 g 0   [DISCONTINUED] sulfamethoxazole -trimethoprim  (BACTRIM  DS) 800-160 MG tablet Take 1 tablet by mouth 2 (two) times daily. (Patient not taking: Reported on 08/22/2024) 20 tablet 1   [DISCONTINUED] traMADol  (ULTRAM ) 50 MG tablet Take 1-2 tablets (50-100 mg total) by mouth every 6 (six) hours as needed for moderate pain (pain score 4-6). (Patient not taking: Reported on 08/22/2024) 12 tablet 0

## 2024-08-22 NOTE — H&P (Signed)
 History and Physical  Kristopher Wilson FMW:[id number] DOB: 1936/06/13 DOA: 08/21/2024  PCP: Berneta Elsie Sayre, MD   Chief Complaint: Chest pain, nausea and headache  HPI: Kristopher Wilson is a 88 y.o. male with medical history significant for CAD s/p PCI, HTN, HLD, CKD 3A, left thyroid  nodule s/p thyroidectomy, restless leg syndrome, chronic back pain, and GERD who was visiting his spouse in the hospital and sent down to the ED for evaluation of chest pain, nausea and headache. Patient reports that she was upstairs in his spouse room attempting to lay down in a recliner to sleep when he started having central chest pain described as sharp and aching with eventual radiation to his left arm.  She had associated nausea and headache.  He reports that yesterday morning, he had an episode of lightheadedness, nausea and shortness of breath that resolved on its own.  He denies any abdominal pain, vomiting, shortness of breath, vision changes, palpitations or lightheadedness.  Currently reports that his initial symptoms have all resolved.  He denies any history of A-fib.  ED Course: Initial vitals show patient afebrile, RR 16-23, HR 120-150s, SBP 110-130s, SpO2 95% on room air. Initial labs significant for creatinine 1.28, glucose 153, normal CBC, troponin 4, TSH 1.76, free T40.81. EKG shows A-fib with RVR. CXR shows no active disease. Pt was started on diltiazem  drip. TRH was consulted for admission.  EDP was asked to consult cardiology for evaluation.  Review of Systems: Please see HPI for pertinent positives and negatives. A complete 10 system review of systems are otherwise negative.  Past Medical History:  Diagnosis Date   BACK PAIN, LUMBAR 10/18/2009   Benign paroxysmal positional vertigo 09/30/2007   CAD (coronary artery disease)    Cancer (HCC)    prostate    CHF (congestive heart failure) (HCC)    CKD (chronic kidney disease)    COLONIC POLYPS, ADENOMATOUS, HX OF    GERD (gastroesophageal  reflux disease)    Headache    Herpes zoster with other nervous system complications(053.19)    Hyperlipidemia    Hypertension    Hyperthyroidism    pt denies   Loss of hearing    Bil/has hearing aids in both ears   Nerve damage of left foot    PERIPHERAL NEUROPATHY 10/11/2007   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   Pre-diabetes    PROSTATE CANCER, HX OF 07/07/2007   s/p prostatectomy   RLS (restless legs syndrome)    S/P skin biopsy 06/12/2019   Ulcer    ULCERATIVE COLITIS, LEFT SIDED 2000   URTICARIA, ALLERGIC 08/05/2009   VENOUS INSUFFICIENCY 07/28/2010   Past Surgical History:  Procedure Laterality Date   ANGIOPLASTY  1990's/2004   stent placement 2   COLONOSCOPY W/ BIOPSIES AND POLYPECTOMY  06/06/2009   adenomatous polyp, diverticulosis, colitis   ENDOVENOUS ABLATION SAPHENOUS VEIN W/ LASER Left 07/09/2021   endovenous laser ablation left greater saphenous vein and stab phlebectomy 10-20 incisions left leg by Medford Blade MD   INGUINAL HERNIA REPAIR     x x   ORIF FIBULA FRACTURE Left 03/02/2017   Procedure: OPEN REDUCTION INTERNAL FIXATION (ORIF) LEFT DISTAL FIBULA FRACTURE WITH DELTOID LIGAMENT REPAIR;  Surgeon: Anderson Maude ORN, MD;  Location: [facility] OR;  Service: Orthopedics;  Laterality: Left;   PROSTATECTOMY     RETINAL LASER PROCEDURE Right 2016 & 2017    ROTATOR CUFF REPAIR Left    SKIN BIOPSY  02/26/2020   Left Dorsum Bridge of Nose and Right Upper  Abdomen   THYROID  LOBECTOMY Left 01/17/2024   Procedure: LOBECTOMY, THYROID ;  Surgeon: Eletha Boas, MD;  Location: [facility];  Service: General;  Laterality: Left;   TONSILLECTOMY     Social History:  reports that he quit smoking about 48 years ago. His smoking use included cigarettes. He has never used smokeless tobacco. He reports that he does not currently use alcohol after a past usage of about 14.0 standard drinks of alcohol per week. He reports that he does not use drugs.  Allergies[1]  Family History  Problem  Relation Age of Onset   Heart attack Father    Throat cancer Brother    Multiple sclerosis Brother    Multiple sclerosis Son    Diabetes Maternal Uncle        x 2   Diabetes Maternal Uncle    Colon cancer Neg Hx    Stroke Neg Hx      Prior to Admission medications  Medication Sig Start Date End Date Taking? Authorizing Provider  amLODipine  (NORVASC ) 10 MG tablet TAKE 1 TABLET BY MOUTH EVERY DAY 06/27/24   Jeffrie Oneil BROCKS, MD  atorvastatin  (LIPITOR) 40 MG tablet TAKE 1 TABLET DAILY 10/13/23   Jeffrie Oneil BROCKS, MD  carvedilol  (COREG ) 12.5 MG tablet TAKE 1 TABLET DAILY 07/27/24   Jeffrie Oneil BROCKS, MD  clopidogrel  (PLAVIX ) 75 MG tablet TAKE 1 TABLET DAILY 09/27/23   Jeffrie Oneil BROCKS, MD  cyanocobalamin  (VITAMIN B12) 1000 MCG tablet Take 1 tablet (1,000 mcg total) by mouth daily. 04/27/24   Berneta Elsie Sayre, MD  cyanocobalamin  (VITAMIN B12) 1000 MCG tablet Take 1 tablet (1,000 mcg total) by mouth daily. 06/13/24   Berneta Elsie Sayre, MD  dapagliflozin  propanediol (FARXIGA ) 10 MG TABS tablet TAKE 1 TABLET DAILY BEFORE BREAKFAST 06/06/24   Berneta Elsie Sayre, MD  fenofibrate  (TRICOR ) 145 MG tablet Take 1 tablet (145 mg total) by mouth daily. 02/10/24   Jeffrie Oneil BROCKS, MD  hydrochlorothiazide  (HYDRODIURIL ) 25 MG tablet Take 1 tablet (25 mg total) by mouth daily. 01/28/24   Lelon Hamilton T, PA-C  isosorbide  mononitrate (IMDUR ) 60 MG 24 hr tablet TAKE 1 TABLET BY MOUTH EVERY DAY 03/01/23   Jeffrie Oneil BROCKS, MD  ketoconazole  (NIZORAL ) 2 % cream Apply a thin coat to feet including the interdigital spaces daily for 14 days. 06/13/24   Berneta Elsie Sayre, MD  levothyroxine  (SYNTHROID ) 25 MCG tablet Take 1 tablet (25 mcg total) by mouth daily. 07/20/24   Berneta Elsie Sayre, MD  methocarbamol  (ROBAXIN ) 500 MG tablet Take 1 tablet (500 mg total) by mouth every 8 (eight) hours as needed for muscle spasms. 12/13/23   Berneta Elsie Sayre, MD  mupirocin  ointment (BACTROBAN ) 2 % Apply 1 Application topically  2 (two) times daily. 02/17/24   Hyatt, Max T, DPM  nitroGLYCERIN  (NITROSTAT ) 0.4 MG SL tablet Place 1 tablet (0.4 mg total) under the tongue every 5 (five) minutes as needed for chest pain. 12/13/23   Berneta Elsie Sayre, MD  pantoprazole  (PROTONIX ) 40 MG tablet Take 1 tablet (40 mg total) by mouth daily. 06/13/24   Berneta Elsie Sayre, MD  pramipexole  (MIRAPEX ) 1 MG tablet Take 1 tablet (1 mg total) by mouth at bedtime. 06/13/24   Berneta Elsie Sayre, MD  sulfamethoxazole -trimethoprim  (BACTRIM  DS) 800-160 MG tablet Take 1 tablet by mouth 2 (two) times daily. 02/17/24   Hyatt, Max T, DPM  traMADol  (ULTRAM ) 50 MG tablet Take 1-2 tablets (50-100 mg total) by mouth every 6 (six) hours as needed  for moderate pain (pain score 4-6). 01/18/24   Eletha Boas, MD  Vitamin D , Ergocalciferol , (DRISDOL ) 1.25 MG (50000 UNIT) CAPS capsule Take 1 capsule (50,000 Units total) by mouth every 7 (seven) days. 04/27/24   Berneta Elsie Sayre, MD    Physical Exam: BP 109/87   Pulse (!) 115   Temp (!) 97.3 F (36.3 C)   Resp (!) 23   Ht 6' 3 (1.905 m)   Wt 97.5 kg   SpO2 97%   BMI 26.87 kg/m  General: Pleasant, well-appearing elderly man laying in bed. No acute distress. HEENT: [place]/AT. Anicteric sclera CV: Tachycardic.  Irregularly irregular rhythm. No murmurs, rubs, or gallops. No LE edema Pulmonary: Lungs CTAB. Normal effort. No wheezing or rales. Abdominal: Soft, nontender, nondistended. Normal bowel sounds. Extremities: Palpable radial and DP pulses. Normal ROM. Skin: Warm and dry. No obvious rash or lesions. Neuro: A&Ox3. Moves all extremities. Normal sensation to light touch. No focal deficit. Psych: Normal mood and affect          Labs on Admission:  Basic Metabolic Panel: Recent Labs  Lab 08/21/24 2201  NA 136  K 3.9  CL 101  CO2 24  GLUCOSE 153*  BUN 27*  CREATININE 1.28*  CALCIUM  9.1   Liver Function Tests: No results for input(s): AST, ALT, ALKPHOS, BILITOT, PROT,  ALBUMIN in the last 168 hours. No results for input(s): LIPASE, AMYLASE in the last 168 hours. No results for input(s): AMMONIA in the last 168 hours. CBC: Recent Labs  Lab 08/21/24 2201  WBC 10.5  HGB 14.4  HCT 43.7  MCV 88.3  PLT 281   Cardiac Enzymes: No results for input(s): CKTOTAL, CKMB, CKMBINDEX, TROPONINI in the last 168 hours. BNP (last 3 results) No results for input(s): BNP in the last 8760 hours.  ProBNP (last 3 results) No results for input(s): PROBNP in the last 8760 hours.  CBG: No results for input(s): GLUCAP in the last 168 hours.  Radiological Exams on Admission: DG Chest 2 View Result Date: 08/21/2024 EXAM: 2 VIEW(S) XRAY OF THE CHEST 08/21/2024 09:50:00 PM COMPARISON: None available. CLINICAL HISTORY: Chest pain. FINDINGS: LUNGS AND PLEURA: Linear opacity at the left lung base. No pleural effusion. No pneumothorax. HEART AND MEDIASTINUM: Left coronary artery stent noted. No acute abnormality of the cardiac and mediastinal silhouettes. BONES AND SOFT TISSUES: No acute osseous abnormality. IMPRESSION: 1. Linear opacity at the left lung base, compatible with atelectasis or scarring. Electronically signed by: Oneil Devonshire MD 08/21/2024 10:02 PM EST RP Workstation: [id number]   Assessment/Plan ALIZE ACY is a 88 y.o. male with medical history significant for CAD s/p PCI, HTN, HLD, CKD 3A, CHF, left thyroid  nodule s/p thyroidectomy, restless leg syndrome, chronic back pain, and GERD who was visiting his spouse in the hospital and sent down to the ED for evaluation of chest pain, nausea and headache and found to have new onset A-fib with RVR  # A-fib with RVR # New onset A-fib - Patient with significant cardiac history presenting to the ED after an episode of chest pain, nausea and headache while visiting spouse in hospital - Patient found to have new onset A-fib with RVR, rates initially in the 140s to 150s - Diltiazem  drip started with  improvement in HR to the 110-120s - Patient currently chest pain-free, troponin negative and EKG without ischemic changes - Cardiology consulted, recommend keeping patient n.p.o. for TEE cardioversion - Patient with CHADS2 Vasc  6, start IV heparin , transition to DOAC at  some point - Check echocardiogram - Trend and replete electrolytes - Telemetry  # Hypertension - BP remains stable with SBP in the 110-130s - Closely monitor BP in the setting of diltiazem  drip - Resume BP meds after completion of med rec  # Hx of left thyroid  nodule - Patient reports a history of a recent thyroidectomy - Initially prescribed Synthroid  however did not pick up prescription and thyroid  labs obtained by were within normal range - Repeat TSH and free T4 within normal limits  # Hx of CHF - No echo on file to assess whether systolic or diastolic - Follow-up echocardiogram  Chronic medical problems: # CAD s/p PCI # GERD # Chronic back pain # Restless leg syndrome # Hyperlipidemia - Patient still pending med rec, resume meds when completed   DVT prophylaxis: Heparin     Code Status: Limited: Do not attempt resuscitation (DNR) -DNR-LIMITED -Do Not Intubate/DNI   Consults called: Cardiology  Family Communication: No family at bedside  Severity of Illness: The appropriate patient status for this patient is INPATIENT. Inpatient status is judged to be reasonable and necessary in order to provide the required intensity of service to ensure the patient's safety. The patient's presenting symptoms, physical exam findings, and initial radiographic and laboratory data in the context of their chronic comorbidities is felt to place them at high risk for further clinical deterioration. Furthermore, it is not anticipated that the patient will be medically stable for discharge from the hospital within 2 midnights of admission.   * I certify that at the point of admission it is my clinical judgment that the patient  will require inpatient hospital care spanning beyond 2 midnights from the point of admission due to high intensity of service, high risk for further deterioration and high frequency of surveillance required.*  Level of care: Progressive    Lou Claretta HERO, MD 08/22/2024, 1:26 AM [facility] Pager: [phone] Isaiah 41:10   If 7PM-7AM, please contact night-coverage [email or web address] Password [email or web address]     [1]  Allergies Allergen Reactions   Keflex [Cephalexin] Hives

## 2024-08-22 NOTE — Progress Notes (Addendum)
°  Progress Note  Patient Name: Kristopher Wilson Date of Encounter: 08/22/2024 [facility] Cardiologist: Oneil Parchment, MD   Interval Summary   Feeling better, no chest pain.  Still in A-fib in the ER.  Vital Signs Vitals:   08/22/24 0425 08/22/24 0430 08/22/24 0515 08/22/24 0837  BP: 99/85 (!) 77/48 107/71 105/75  Pulse: (!) 123 (!) 59 73 (!) 54  Resp: 17 16 16 15   Temp:    97.7 F (36.5 C)  TempSrc:    Oral  SpO2: 98% 96% 95% 99%  Weight:      Height:        Intake/Output Summary (Last 24 hours) at 08/22/2024 0947 Last data filed at 08/22/2024 0933 Gross per 24 hour  Intake 95.96 ml  Output 425 ml  Net -329.04 ml      08/21/2024   10:07 PM 08/10/2024   10:16 AM 07/20/2024   10:34 AM  Last 3 Weights  Weight (lbs) 215 lb 221 lb 221 lb  Weight (kg) 97.523 kg 100.245 kg 100.245 kg      Telemetry/ECG  A-fib heart rate approximately 100- Personally Reviewed  Physical Exam  GEN: No acute distress.   Neck: No JVD Cardiac: Irregularly irregular, no murmurs, rubs, or gallops.  Respiratory: Clear to auscultation bilaterally. GI: Soft, nontender, non-distended  MS: No edema  Assessment & Plan   88 year old male with new onset atrial fibrillation  Paroxysmal atrial fibrillation - Newly discovered.  Plan for TEE cardioversion.  Risk and benefits have been explained.  Willing to proceed. -Currently on IV heparin  as well as IV diltiazem , 7.5. -Post cardioversion we will transition over to Eliquis  5 mg twice a day (starting bridge now with first dose of Eliquis  with IV heparin ) as well as p.o. long-acting diltiazem  if necessary.  According to home meds was taking carvedilol  12.5 mg twice a day at home.  Another possibility would be to increase the carvedilol  to 25 mg twice a day instead of additional diltiazem .  We will see how his heart rate is post cardioversion. -TEE will help assess ejection fraction.  Has not had an echocardiogram in several years.  CAD -  Stable.  Has taken Plavix  at home.  We will likely DC the Plavix  and continue Eliquis  as outpatient to help reduce bleeding risks.  Prior stent to LAD as well as distal RCA branch.  Nuclear stress test 3 years ago showed no ischemia.  Wife is currently in the hospital on the 6 floor after discovering hip fracture rib fracture, pain.  Chest pain - Has resolved.  Likely in part from A-fib RVR.    For questions or updates, please contact [facility] Please consult [email or web address] for contact info under         Signed, Oneil Parchment, MD

## 2024-08-22 NOTE — Progress Notes (Addendum)
 PROGRESS NOTE  Kristopher Wilson FMW:[id number] DOB: [birth year] DOA: 08/21/2024 PCP: Berneta Elsie Sayre, MD   LOS: 1 day   Brief narrative:  Kristopher Wilson is a 88 y.o. male with medical history significant for CAD s/p PCI, essential hypertension, hyperlipidemia, CKD stage IIIa, left thyroid  nodule s/p thyroidectomy, restless leg syndrome, chronic back pain, and GERD presented to hospital with chest pain nausea and headache.  In the ED, patient was tachycardic slightly tachypneic.  Initial labs significant for creatinine 1.28, glucose 153, normal CBC, troponin 4, TSH 1.76, free T40.81. EKG showed A-fib with RVR. CXR did not show any infiltrate. Pt was started on diltiazem  drip and cardiology was consulted for further evaluation and treatment.  Patient was then admitted to the hospital for further evaluation and treatment.  Assessment/Plan: Principal Problem:   Atrial fibrillation with RVR (HCC) Active Problems:   History of thyroid  nodule   New onset a-fib (HCC)  Paroxysmal A-fib with RVR New onset.  On Cardizem  drip.  Cardiology on board.  Currently n.p.o. for TEE cardioversion.  Patient with CHADS2 Vasc  6, continue IV heparin , transition to DOAC at some point.  Check 2D echocardiogram.  Continue telemetry monitoring.   Essential hypertension Currently on Cardizem  drip.  Patient is on amlodipine  and Coreg  at home.  History of thyroid  nodule status post thyroidectomy Was prescribed Synthroid  but has not taken it.  Check thyroid  function and resume if necessary.   CAD s/p PCI Chronic congestive heart failure Check 2D echocardiogram.  Patient is on Plavix  at home.  Has been started on Eliquis .  On Coreg  at home including amlodipine .  GERD Continue PPI  Chronic back pain Continue supportive care  Restless leg syndrome Continue pramipexole   Hyperlipidemia Will resume Lipitor  DVT prophylaxis:  apixaban  (ELIQUIS ) tablet 5 mg   Disposition: Uncertain, likely home in 1 to 2  days  Status is: Inpatient Remains inpatient appropriate because: Pending clinical improvement, plan for cardioversion    Code Status:     Code Status: Limited: Do not attempt resuscitation (DNR) -DNR-LIMITED -Do Not Intubate/DNI   Family Communication: None at bedside.  Spoke with the patient's spouse on the phone 08/22/2024  Consultants: Cardiology  Procedures: Plan for cardioversion  Anti-infectives:  None  Anti-infectives (From admission, onward)    None        Subjective: Today, patient was seen and examined at bedside.  Patient states that she feels okay.  Denies any chest pain dyspnea or shortness of breath.  Objective: Vitals:   08/22/24 0837 08/22/24 1115  BP: 105/75 128/79  Pulse: (!) 54 79  Resp: 15 (!) 22  Temp: 97.7 F (36.5 C)   SpO2: 99% 99%    Intake/Output Summary (Last 24 hours) at 08/22/2024 1146 Last data filed at 08/22/2024 0933 Gross per 24 hour  Intake 95.96 ml  Output 425 ml  Net -329.04 ml   Filed Weights   08/21/24 2207  Weight: 97.5 kg   Body mass index is 26.87 kg/m.   Physical Exam:  GENERAL: Patient is alert awake and oriented. Not in obvious distress.  Elderly male, HENT: No scleral pallor or icterus. Pupils equally reactive to light. Oral mucosa is moist NECK: is supple, no gross swelling noted. CHEST: Clear to auscultation. No crackles or wheezes.  Diminished breath sounds bilaterally. CVS: S1 and S2 heard, no murmur.  Irregular rhythm. ABDOMEN: Soft, non-tender, bowel sounds are present. EXTREMITIES: No edema. CNS: Cranial nerves are intact. No focal motor deficits. SKIN: warm and  dry without rashes.  Data Review: I have personally reviewed the following laboratory data and studies,  CBC: Recent Labs  Lab 08/21/24 2201  WBC 10.5  HGB 14.4  HCT 43.7  MCV 88.3  PLT 281   Basic Metabolic Panel: Recent Labs  Lab 08/21/24 2201 08/22/24 0447  NA 136 139  K 3.9 3.7  CL 101 105  CO2 24 26  GLUCOSE 153*  107*  BUN 27* 23  CREATININE 1.28* 1.10  CALCIUM  9.1 8.8*  MG  --  2.1   Liver Function Tests: No results for input(s): AST, ALT, ALKPHOS, BILITOT, PROT, ALBUMIN in the last 168 hours. No results for input(s): LIPASE, AMYLASE in the last 168 hours. No results for input(s): AMMONIA in the last 168 hours. Cardiac Enzymes: No results for input(s): CKTOTAL, CKMB, CKMBINDEX, TROPONINI in the last 168 hours. BNP (last 3 results) Recent Labs    08/22/24 0057  BNP 169.3*    ProBNP (last 3 results) No results for input(s): PROBNP in the last 8760 hours.  CBG: No results for input(s): GLUCAP in the last 168 hours. No results found for this or any previous visit (from the past 240 hours).   Studies: ECHOCARDIOGRAM COMPLETE Result Date: 08/22/2024    ECHOCARDIOGRAM REPORT   Patient Name:   Kristopher Wilson Date of Exam: 08/22/2024 Medical Rec #:  [id number]       Height:       75.0 in Accession #:    [id number]      Weight:       215.0 lb Date of Birth:  05-01-1936       BSA:          2.263 m Patient Age:    88 years        BP:           113/79 mmHg Patient Gender: M               HR:           104 bpm. Exam Location:  Inpatient Procedure: 2D Echo, Color Doppler, Cardiac Doppler and Intracardiac            Opacification Agent (Both Spectral and Color Flow Doppler were            utilized during procedure). Indications:    Atrial fibrillation 148.91  History:        Patient has no prior history of Echocardiogram examinations.                 CAD; Risk Factors:Hypertension and Former Smoker.  Sonographer:    Merlynn Argyle Referring Phys: [id number] PROSPER M AMPONSAH  Sonographer Comments: Suboptimal subcostal window. Image acquisition challenging due to respiratory motion. IMPRESSIONS  1. Left ventricular ejection fraction, by estimation, is 70 to 75%. The left ventricle has hyperdynamic function. The left ventricle has no regional wall motion abnormalities. Left ventricular  diastolic function could not be evaluated.  2. Right ventricular systolic function is normal. The right ventricular size is normal. Tricuspid regurgitation signal is inadequate for assessing PA pressure.  3. Left atrial size was mildly dilated.  4. The mitral valve is degenerative. No evidence of mitral valve regurgitation. Mild mitral stenosis. The mean mitral valve gradient is 4.0 mmHg. Moderate mitral annular calcification.  5. The aortic valve is tricuspid. There is mild calcification of the aortic valve. There is mild thickening of the aortic valve. Aortic valve regurgitation is not visualized. Aortic valve sclerosis/calcification is present, without  any evidence of aortic stenosis.  6. Aortic dilatation noted. There is mild dilatation of the aortic root, measuring 44 mm. FINDINGS  Left Ventricle: Left ventricular ejection fraction, by estimation, is 70 to 75%. The left ventricle has hyperdynamic function. The left ventricle has no regional wall motion abnormalities. Definity  contrast agent was given IV to delineate the left ventricular endocardial borders. The left ventricular internal cavity size was normal in size. There is no left ventricular hypertrophy. Left ventricular diastolic function could not be evaluated due to atrial fibrillation. Left ventricular diastolic function could not be evaluated. Right Ventricle: The right ventricular size is normal. No increase in right ventricular wall thickness. Right ventricular systolic function is normal. Tricuspid regurgitation signal is inadequate for assessing PA pressure. Left Atrium: Left atrial size was mildly dilated. Right Atrium: Right atrial size was normal in size. Pericardium: There is no evidence of pericardial effusion. Mitral Valve: The mitral valve is degenerative in appearance. Moderate mitral annular calcification. No evidence of mitral valve regurgitation. Mild mitral valve stenosis. MV peak gradient, 8.1 mmHg. The mean mitral valve gradient is  4.0 mmHg. Tricuspid Valve: The tricuspid valve is normal in structure. Tricuspid valve regurgitation is not demonstrated. Aortic Valve: The aortic valve is tricuspid. There is mild calcification of the aortic valve. There is mild thickening of the aortic valve. Aortic valve regurgitation is not visualized. Aortic valve sclerosis/calcification is present, without any evidence of aortic stenosis. Pulmonic Valve: The pulmonic valve was not well visualized. Pulmonic valve regurgitation is not visualized. No evidence of pulmonic stenosis. Aorta: Aortic dilatation noted. There is mild dilatation of the aortic root, measuring 44 mm. IAS/Shunts: The interatrial septum was not well visualized.  LEFT VENTRICLE PLAX 2D LVIDd:         3.70 cm LVIDs:         2.10 cm LV PW:         1.10 cm LV IVS:        1.00 cm LVOT diam:     1.80 cm LV SV:         45 LV SV Index:   20 LVOT Area:     2.54 cm  RIGHT VENTRICLE RV Basal diam:  5.30 cm RV Mid diam:    5.15 cm TAPSE (M-mode): 1.5 cm LEFT ATRIUM           Index        RIGHT ATRIUM           Index LA diam:      4.40 cm 1.94 cm/m   RA Area:     14.60 cm LA Vol (A2C): 48.3 ml 21.34 ml/m  RA Volume:   32.00 ml  14.14 ml/m LA Vol (A4C): 57.2 ml 25.28 ml/m  AORTIC VALVE LVOT Vmax:   95.70 cm/s LVOT Vmean:  61.550 cm/s LVOT VTI:    0.176 m  AORTA Ao Root diam: 4.40 cm Ao Asc diam:  3.90 cm MITRAL VALVE MV Area VTI:  1.38 cm   SHUNTS MV Peak grad: 8.1 mmHg   Systemic VTI:  0.18 m MV Mean grad: 4.0 mmHg   Systemic Diam: 1.80 cm MV Vmax:      1.42 m/s MV Vmean:     92.6 cm/s MV VTI:       0.32 m Jerel Croitoru MD Electronically signed by Jerel Balding MD Signature Date/Time: 08/22/2024/11:42:10 AM    Final    DG Chest 2 View Result Date: 08/21/2024 EXAM: 2 VIEW(S) XRAY OF THE CHEST  08/21/2024 09:50:00 PM COMPARISON: None available. CLINICAL HISTORY: Chest pain. FINDINGS: LUNGS AND PLEURA: Linear opacity at the left lung base. No pleural effusion. No pneumothorax. HEART AND  MEDIASTINUM: Left coronary artery stent noted. No acute abnormality of the cardiac and mediastinal silhouettes. BONES AND SOFT TISSUES: No acute osseous abnormality. IMPRESSION: 1. Linear opacity at the left lung base, compatible with atelectasis or scarring. Electronically signed by: Oneil Devonshire MD 08/21/2024 10:02 PM EST RP Workstation: [email or web address] Vernal Alstrom, MD  [facility] 08/22/2024  If 7PM-7AM, please contact night-coverage

## 2024-08-22 NOTE — Progress Notes (Signed)
 Spoke with Dr Jeffrie.  No need to restart IV heparin  will give 2 more dosese of eliquisprior to cardioversion tomorrow.  Pharmacy notified.

## 2024-08-23 ENCOUNTER — Inpatient Hospital Stay (HOSPITAL_COMMUNITY): Admitting: Anesthesiology

## 2024-08-23 ENCOUNTER — Inpatient Hospital Stay (HOSPITAL_COMMUNITY)

## 2024-08-23 ENCOUNTER — Other Ambulatory Visit (HOSPITAL_COMMUNITY): Payer: Self-pay

## 2024-08-23 ENCOUNTER — Encounter (HOSPITAL_COMMUNITY): Payer: Self-pay | Admitting: Student

## 2024-08-23 ENCOUNTER — Encounter (HOSPITAL_COMMUNITY): Admission: EM | Disposition: A | Payer: Self-pay | Source: Home / Self Care | Attending: Internal Medicine

## 2024-08-23 DIAGNOSIS — I2510 Atherosclerotic heart disease of native coronary artery without angina pectoris: Secondary | ICD-10-CM

## 2024-08-23 DIAGNOSIS — I4891 Unspecified atrial fibrillation: Secondary | ICD-10-CM

## 2024-08-23 DIAGNOSIS — Z87891 Personal history of nicotine dependence: Secondary | ICD-10-CM | POA: Diagnosis not present

## 2024-08-23 HISTORY — PX: CARDIOVERSION: EP1203

## 2024-08-23 HISTORY — PX: TRANSESOPHAGEAL ECHOCARDIOGRAM (CATH LAB): EP1270

## 2024-08-23 LAB — CBC
HCT: 42.6 % (ref 39.0–52.0)
Hemoglobin: 13.8 g/dL (ref 13.0–17.0)
MCH: 28.8 pg (ref 26.0–34.0)
MCHC: 32.4 g/dL (ref 30.0–36.0)
MCV: 88.9 fL (ref 80.0–100.0)
Platelets: 268 K/uL (ref 150–400)
RBC: 4.79 MIL/uL (ref 4.22–5.81)
RDW: 15.1 % (ref 11.5–15.5)
WBC: 9.8 K/uL (ref 4.0–10.5)
nRBC: 0 % (ref 0.0–0.2)

## 2024-08-23 LAB — BASIC METABOLIC PANEL WITH GFR
Anion gap: 10 (ref 5–15)
BUN: 23 mg/dL (ref 8–23)
CO2: 25 mmol/L (ref 22–32)
Calcium: 9 mg/dL (ref 8.9–10.3)
Chloride: 104 mmol/L (ref 98–111)
Creatinine, Ser: 1.19 mg/dL (ref 0.61–1.24)
GFR, Estimated: 59 mL/min — ABNORMAL LOW (ref >60)
Glucose, Bld: 108 mg/dL — ABNORMAL HIGH (ref 70–99)
Potassium: 3.5 mmol/L (ref 3.5–5.1)
Sodium: 138 mmol/L (ref 135–145)

## 2024-08-23 LAB — MAGNESIUM: Magnesium: 2.1 mg/dL (ref 1.7–2.4)

## 2024-08-23 LAB — ECHO TEE

## 2024-08-23 SURGERY — TRANSESOPHAGEAL ECHOCARDIOGRAM (TEE) (CATHLAB)
Anesthesia: Monitor Anesthesia Care

## 2024-08-23 MED ORDER — APIXABAN 5 MG PO TABS
5.0000 mg | ORAL_TABLET | Freq: Two times a day (BID) | ORAL | 2 refills | Status: AC
  Filled 2024-08-23: qty 60, 30d supply, fill #0

## 2024-08-23 MED ORDER — PHENYLEPHRINE 80 MCG/ML (10ML) SYRINGE FOR IV PUSH (FOR BLOOD PRESSURE SUPPORT)
PREFILLED_SYRINGE | INTRAVENOUS | Status: DC | PRN
  Administered 2024-08-23 (×3): 160 ug via INTRAVENOUS

## 2024-08-23 MED ORDER — DILTIAZEM HCL ER COATED BEADS 180 MG PO CP24
180.0000 mg | ORAL_CAPSULE | Freq: Every day | ORAL | Status: DC
  Administered 2024-08-23: 13:00:00 180 mg via ORAL
  Filled 2024-08-23: qty 1

## 2024-08-23 MED ORDER — PROPOFOL 10 MG/ML IV BOLUS
INTRAVENOUS | Status: DC | PRN
  Administered 2024-08-23: 10:00:00 20 mg via INTRAVENOUS
  Administered 2024-08-23: 10:00:00 30 mg via INTRAVENOUS

## 2024-08-23 MED ORDER — PROPOFOL 500 MG/50ML IV EMUL
INTRAVENOUS | Status: DC | PRN
  Administered 2024-08-23: 10:00:00 90 ug/kg/min via INTRAVENOUS

## 2024-08-23 MED ORDER — DILTIAZEM HCL ER COATED BEADS 180 MG PO CP24
180.0000 mg | ORAL_CAPSULE | Freq: Every day | ORAL | 2 refills | Status: AC
  Filled 2024-08-23: qty 30, 30d supply, fill #0

## 2024-08-23 SURGICAL SUPPLY — 1 items: PAD DEFIB RADIO PHYSIO CONN (PAD) ×1 IMPLANT

## 2024-08-23 NOTE — Discharge Summary (Signed)
 Physician Discharge Summary  MOSES ODOHERTY FMW:[id number] DOB: 1936/02/01 DOA: 08/21/2024  PCP: Berneta Elsie Sayre, MD  Admit date: 08/21/2024 Discharge date: 08/23/2024  Admitted From: Home  Discharge disposition: home    Recommendations for Outpatient Follow-Up:   Follow up with your primary care provider in one week.  Check CBC, BMP, magnesium in the next visit Follow-up with atrial fibrillation clinic as scheduled.   Discharge Diagnosis:   Principal Problem:   Atrial fibrillation with RVR (HCC) Active Problems:   History of thyroid  nodule   New onset a-fib [facility])    Discharge Condition: Improved.  Diet recommendation: Low sodium, heart healthy.   Wound care: None.  Code status: Full.   History of Present Illness:   Kristopher Wilson is a 88 y.o. male with medical history significant for CAD s/p PCI, essential hypertension, hyperlipidemia, CKD stage IIIa, left thyroid  nodule s/p thyroidectomy, restless leg syndrome, chronic back pain, and GERD presented to the hospital with chest pain nausea and headache.  In the ED, patient was tachycardic slightly tachypneic.  Initial labs significant for creatinine 1.28, glucose 153, normal CBC, troponin 4, TSH 1.76, free T40.81. EKG showed A-fib with RVR. CXR did not show any infiltrate. Pt was started on diltiazem  drip and cardiology was consulted for further evaluation and treatment.  Patient was then admitted to the hospital for further evaluation and treatment.    Hospital Course:   Following conditions were addressed during hospitalization as listed below,  Symptomatic paroxysmal A-fib with RVR New onset.  Was started on Cardizem  drip.  Cardiology was consulted for patient with CHADS2 Vasc  6, was initially on IV heparin  which was transitioned to DOAC Eliquis  and patient underwent cardioversion on 08/23/2024 with conversion to normal sinus rhythm.  At this time cardiology has seen the patient and has considered  patient is stable for disposition home.  Patient will follow-up with atrial fibrillation clinic after discharge..  2D echocardiogram showed LV ejection fraction of 70-75 percent with no regional wall motion abnormality.     Essential hypertension Was initially on Cardizem  drip.  This has been transitioned to oral Cardizem .  Communicated with cardiology and at this time we will discontinue patient on amlodipine  and Coreg  from home.SABRA     History of thyroid  nodule status post thyroidectomy Was prescribed Synthroid  but has not taken it.  TSH and free T4 within normal range.  Will not restart at this time.   CAD s/p PCI Chronic congestive heart failure 2D Echocardiogram with preserved LV function.  Continue Eliquis .  On Plavix , Coreg  and amlodipine  at home.  Will be on Cardizem  at home and Coreg  amlodipine  will be on hold.   GERD On Protonix    Chronic back pain Continue supportive care   Restless leg syndrome Continue pramipexole    Hyperlipidemia Continue Lipitor  Disposition.  At this time, patient is stable for disposition home with outpatient PCP and cardiology follow-up  Medical Consultants:  Cardiology starting  Procedures:    DC cardioversion 08/23/2024 Subjective:   Today, patient was seen and examined at bedside.  Seen before cardioversion.  Denies any shortness of breath chest pain or palpitation.  Discharge Exam:   Vitals:   08/23/24 1007 08/23/24 1230  BP:  119/88  Pulse: 93 69  Resp: 20 18  Temp:  (!) 97.5 F (36.4 C)  SpO2: 92% 95%   Vitals:   08/23/24 1005 08/23/24 1006 08/23/24 1007 08/23/24 1230  BP:    119/88  Pulse: 92 89 93  69  Resp: 18 19 20 18   Temp:    (!) 97.5 F (36.4 C)  TempSrc:    Oral  SpO2: 93% 91% 92% 95%  Weight:      Height:      GENERAL: Patient is alert awake and oriented. Not in obvious distress.  Elderly male, HENT: No scleral pallor or icterus. Pupils equally reactive to light. Oral mucosa is moist NECK: is supple, no gross  swelling noted. CHEST: Clear to auscultation. No crackles or wheezes.  Diminished breath sounds bilaterally. CVS: S1 and S2 heard, no murmur.  Irregularly irregular rhythm. ABDOMEN: Soft, non-tender, bowel sounds are present. EXTREMITIES: No edema. CNS: Cranial nerves are intact. No focal motor deficits. SKIN: warm and dry without rashes.  The results of significant diagnostics from this hospitalization (including imaging, microbiology, ancillary and laboratory) are listed below for reference.     Diagnostic Studies:   ECHOCARDIOGRAM COMPLETE Result Date: 08/22/2024    ECHOCARDIOGRAM REPORT   Patient Name:   [name] Date of Exam: 08/22/2024 Medical Rec #:  [id number]       Height:       75.0 in Accession #:    [id number]      Weight:       215.0 lb Date of Birth:  01-04-1936       BSA:          2.263 m Patient Age:    88 years        BP:           113/79 mmHg Patient Gender: M               HR:           104 bpm. Exam Location:  Inpatient Procedure: 2D Echo, Color Doppler, Cardiac Doppler and Intracardiac            Opacification Agent (Both Spectral and Color Flow Doppler were            utilized during procedure). Indications:    Atrial fibrillation 148.91  History:        Patient has no prior history of Echocardiogram examinations.                 CAD; Risk Factors:Hypertension and Former Smoker.  Sonographer:    Merlynn Argyle Referring Phys: [id number] PROSPER M AMPONSAH  Sonographer Comments: Suboptimal subcostal window. Image acquisition challenging due to respiratory motion. IMPRESSIONS  1. Left ventricular ejection fraction, by estimation, is 70 to 75%. The left ventricle has hyperdynamic function. The left ventricle has no regional wall motion abnormalities. Left ventricular diastolic function could not be evaluated.  2. Right ventricular systolic function is normal. The right ventricular size is normal. Tricuspid regurgitation signal is inadequate for assessing PA pressure.  3. Left atrial  size was mildly dilated.  4. The mitral valve is degenerative. No evidence of mitral valve regurgitation. Mild mitral stenosis. The mean mitral valve gradient is 4.0 mmHg. Moderate mitral annular calcification.  5. The aortic valve is tricuspid. There is mild calcification of the aortic valve. There is mild thickening of the aortic valve. Aortic valve regurgitation is not visualized. Aortic valve sclerosis/calcification is present, without any evidence of aortic stenosis.  6. Aortic dilatation noted. There is mild dilatation of the aortic root, measuring 44 mm. FINDINGS  Left Ventricle: Left ventricular ejection fraction, by estimation, is 70 to 75%. The left ventricle has hyperdynamic function. The left ventricle has no regional wall motion abnormalities. Definity   contrast agent was given IV to delineate the left ventricular endocardial borders. The left ventricular internal cavity size was normal in size. There is no left ventricular hypertrophy. Left ventricular diastolic function could not be evaluated due to atrial fibrillation. Left ventricular diastolic function could not be evaluated. Right Ventricle: The right ventricular size is normal. No increase in right ventricular wall thickness. Right ventricular systolic function is normal. Tricuspid regurgitation signal is inadequate for assessing PA pressure. Left Atrium: Left atrial size was mildly dilated. Right Atrium: Right atrial size was normal in size. Pericardium: There is no evidence of pericardial effusion. Mitral Valve: The mitral valve is degenerative in appearance. Moderate mitral annular calcification. No evidence of mitral valve regurgitation. Mild mitral valve stenosis. MV peak gradient, 8.1 mmHg. The mean mitral valve gradient is 4.0 mmHg. Tricuspid Valve: The tricuspid valve is normal in structure. Tricuspid valve regurgitation is not demonstrated. Aortic Valve: The aortic valve is tricuspid. There is mild calcification of the aortic valve. There  is mild thickening of the aortic valve. Aortic valve regurgitation is not visualized. Aortic valve sclerosis/calcification is present, without any evidence of aortic stenosis. Pulmonic Valve: The pulmonic valve was not well visualized. Pulmonic valve regurgitation is not visualized. No evidence of pulmonic stenosis. Aorta: Aortic dilatation noted. There is mild dilatation of the aortic root, measuring 44 mm. IAS/Shunts: The interatrial septum was not well visualized.  LEFT VENTRICLE PLAX 2D LVIDd:         3.70 cm LVIDs:         2.10 cm LV PW:         1.10 cm LV IVS:        1.00 cm LVOT diam:     1.80 cm LV SV:         45 LV SV Index:   20 LVOT Area:     2.54 cm  RIGHT VENTRICLE RV Basal diam:  5.30 cm RV Mid diam:    5.15 cm TAPSE (M-mode): 1.5 cm LEFT ATRIUM           Index        RIGHT ATRIUM           Index LA diam:      4.40 cm 1.94 cm/m   RA Area:     14.60 cm LA Vol (A2C): 48.3 ml 21.34 ml/m  RA Volume:   32.00 ml  14.14 ml/m LA Vol (A4C): 57.2 ml 25.28 ml/m  AORTIC VALVE LVOT Vmax:   95.70 cm/s LVOT Vmean:  61.550 cm/s LVOT VTI:    0.176 m  AORTA Ao Root diam: 4.40 cm Ao Asc diam:  3.90 cm MITRAL VALVE MV Area VTI:  1.38 cm   SHUNTS MV Peak grad: 8.1 mmHg   Systemic VTI:  0.18 m MV Mean grad: 4.0 mmHg   Systemic Diam: 1.80 cm MV Vmax:      1.42 m/s MV Vmean:     92.6 cm/s MV VTI:       0.32 m Jerel Croitoru MD Electronically signed by Jerel Balding MD Signature Date/Time: 08/22/2024/11:42:10 AM    Final    DG Chest 2 View Result Date: 08/21/2024 EXAM: 2 VIEW(S) XRAY OF THE CHEST 08/21/2024 09:50:00 PM COMPARISON: None available. CLINICAL HISTORY: Chest pain. FINDINGS: LUNGS AND PLEURA: Linear opacity at the left lung base. No pleural effusion. No pneumothorax. HEART AND MEDIASTINUM: Left coronary artery stent noted. No acute abnormality of the cardiac and mediastinal silhouettes. BONES AND SOFT TISSUES: No acute osseous abnormality.  IMPRESSION: 1. Linear opacity at the left lung base, compatible  with atelectasis or scarring. Electronically signed by: Oneil Devonshire MD 08/21/2024 10:02 PM EST RP Workstation: [id number]     Labs:   Basic Metabolic Panel: Recent Labs  Lab 08/21/24 2201 08/22/24 0447 08/23/24 0235  NA 136 139 138  K 3.9 3.7 3.5  CL 101 105 104  CO2 24 26 25   GLUCOSE 153* 107* 108*  BUN 27* 23 23  CREATININE 1.28* 1.10 1.19  CALCIUM  9.1 8.8* 9.0  MG  --  2.1 2.1   GFR Estimated Creatinine Clearance: 53.3 mL/min (by C-G formula based on SCr of 1.19 mg/dL). Liver Function Tests: No results for input(s): AST, ALT, ALKPHOS, BILITOT, PROT, ALBUMIN in the last 168 hours. No results for input(s): LIPASE, AMYLASE in the last 168 hours. No results for input(s): AMMONIA in the last 168 hours. Coagulation profile No results for input(s): INR, PROTIME in the last 168 hours.  CBC: Recent Labs  Lab 08/21/24 2201 08/23/24 0235  WBC 10.5 9.8  HGB 14.4 13.8  HCT 43.7 42.6  MCV 88.3 88.9  PLT 281 268   Cardiac Enzymes: No results for input(s): CKTOTAL, CKMB, CKMBINDEX, TROPONINI in the last 168 hours. BNP: Invalid input(s): POCBNP CBG: No results for input(s): GLUCAP in the last 168 hours. D-Dimer No results for input(s): DDIMER in the last 72 hours. Hgb A1c No results for input(s): HGBA1C in the last 72 hours. Lipid Profile No results for input(s): CHOL, HDL, LDLCALC, TRIG, CHOLHDL, LDLDIRECT in the last 72 hours. Thyroid  function studies Recent Labs    08/21/24 2201  TSH 1.761   Anemia work up No results for input(s): VITAMINB12, FOLATE, FERRITIN, TIBC, IRON, RETICCTPCT in the last 72 hours. Microbiology No results found for this or any previous visit (from the past 240 hours).   Discharge Instructions:   Discharge Instructions     Amb referral to AFIB Clinic   Complete by: As directed    Call MD for:  difficulty breathing, headache or visual disturbances   Complete by: As  directed    Discharge instructions   Complete by: As directed    Follow-up with your primary care provider in 1 week.  Check blood work at that time.  Follow-up with the atrial fibrillation clinic as outpatient (office to schedule).  Please take note of medications that have  changed.  Seek medical attention for worsening symptoms.   Increase activity slowly   Complete by: As directed       Allergies as of 08/23/2024       Reactions   Keflex [cephalexin] Hives        Medication List     STOP taking these medications    amLODipine  10 MG tablet Commonly known as: NORVASC    carvedilol  12.5 MG tablet Commonly known as: COREG        TAKE these medications    pramipexole  1 MG tablet Commonly known as: MIRAPEX  Take 1 tablet (1 mg total) by mouth at bedtime. The timing of this medication is very important.   acetaminophen  325 MG tablet Commonly known as: TYLENOL  Take 650 mg by mouth every 6 (six) hours as needed for headache.   apixaban  5 MG Tabs tablet Commonly known as: ELIQUIS  Take 1 tablet (5 mg total) by mouth 2 (two) times daily.   atorvastatin  40 MG tablet Commonly known as: LIPITOR TAKE 1 TABLET DAILY   CALCIUM  500 PO Take 1 tablet by mouth in the morning.  clopidogrel  75 MG tablet Commonly known as: PLAVIX  TAKE 1 TABLET DAILY   cyanocobalamin  1000 MCG tablet Commonly known as: VITAMIN B12 Take 1 tablet (1,000 mcg total) by mouth daily.   dapagliflozin  propanediol 10 MG Tabs tablet Commonly known as: FARXIGA  TAKE 1 TABLET DAILY BEFORE BREAKFAST   diltiazem  180 MG 24 hr capsule Commonly known as: CARDIZEM  CD Take 1 capsule (180 mg total) by mouth daily.   fenofibrate  145 MG tablet Commonly known as: TRICOR  Take 1 tablet (145 mg total) by mouth daily.   hydrochlorothiazide  25 MG tablet Commonly known as: HYDRODIURIL  Take 1 tablet (25 mg total) by mouth daily.   hydroxychloroquine  200 MG tablet Commonly known as: PLAQUENIL  Take 200 mg by  mouth 2 (two) times daily.   isosorbide  mononitrate 60 MG 24 hr tablet Commonly known as: IMDUR  TAKE 1 TABLET BY MOUTH EVERY DAY   ketoconazole  2 % cream Commonly known as: NIZORAL  Apply 1 Application topically daily.   levothyroxine  25 MCG tablet Commonly known as: SYNTHROID  Take 1 tablet (25 mcg total) by mouth daily.   nitroGLYCERIN  0.4 MG SL tablet Commonly known as: NITROSTAT  Place 1 tablet (0.4 mg total) under the tongue every 5 (five) minutes as needed for chest pain.   pantoprazole  40 MG tablet Commonly known as: PROTONIX  Take 1 tablet (40 mg total) by mouth daily.   predniSONE  5 MG tablet Commonly known as: DELTASONE  Take 5 mg by mouth daily with breakfast. Patient following a specific course to wean off medication   Vitamin D  (Ergocalciferol ) 1.25 MG (50000 UNIT) Caps capsule Commonly known as: DRISDOL  Take 1 capsule (50,000 Units total) by mouth every 7 (seven) days. What changed: when to take this        Follow-up Information     Berneta Elsie Sayre, MD Follow up in 1 week(s).   Specialty: Family Medicine Contact information: [address] [place] [address] [phone]                  Time coordinating discharge: 39 minutes  Signed:  Brittain Smithey  [facility] 08/23/2024, 2:14 PM

## 2024-08-23 NOTE — TOC Transition Note (Signed)
 Transition of Care [facility]) - Discharge Note   Patient Details  Name: Kristopher Wilson MRN: [id number] Date of Birth: October 18, 1935  Transition of [facility]) CM/SW Contact:  Waddell Barnie Rama, RN Phone Number: 08/23/2024, 2:50 PM   Clinical Narrative:    For dc today, has transportation by family who is at bedside. He has no needs.         Patient Goals and CMS Choice            Discharge Placement                       Discharge Plan and Services Additional resources added to the After Visit Summary for                                       Social Drivers of Health (SDOH) Interventions SDOH Screenings   Food Insecurity: Patient Declined (08/22/2024)  Housing: Low Risk (08/22/2024)  Transportation Needs: No Transportation Needs (08/22/2024)  Utilities: Not At Risk (08/22/2024)  Alcohol Screen: Low Risk (07/19/2024)  Depression (PHQ2-9): Low Risk (07/20/2024)  Financial Resource Strain: Patient Declined (07/19/2024)  Physical Activity: Sufficiently Active (07/19/2024)  Social Connections: Unknown (08/22/2024)  Stress: No Stress Concern Present (07/19/2024)  Tobacco Use: Medium Risk (08/21/2024)     Readmission Risk Interventions     No data to display

## 2024-08-23 NOTE — Progress Notes (Signed)
 PROGRESS NOTE  Kristopher Wilson FMW:[id number] DOB: Sep 29, 1935 DOA: 08/21/2024 PCP: Berneta Elsie Sayre, MD   LOS: 2 days   Brief narrative:  Kristopher Wilson is a 88 y.o. male with medical history significant for CAD s/p PCI, essential hypertension, hyperlipidemia, CKD stage IIIa, left thyroid  nodule s/p thyroidectomy, restless leg syndrome, chronic back pain, and GERD presented to the hospital with chest pain nausea and headache.  In the ED, patient was tachycardic slightly tachypneic.  Initial labs significant for creatinine 1.28, glucose 153, normal CBC, troponin 4, TSH 1.76, free T40.81. EKG showed A-fib with RVR. CXR did not show any infiltrate. Pt was started on diltiazem  drip and cardiology was consulted for further evaluation and treatment.  Patient was then admitted to the hospital for further evaluation and treatment.  Assessment/Plan: Principal Problem:   Atrial fibrillation with RVR (HCC) Active Problems:   History of thyroid  nodule   New onset a-fib (HCC)  Symptomatic paroxysmal A-fib with RVR New onset.  Was started on Cardizem  drip.  Cardiology on board and plans for cardioversion today.  Patient with CHADS2 Vasc  6, continue IV heparin , transition to DOAC at some point.  2D echocardiogram showed LV ejection fraction of 70-75 percent with no regional wall motion abnormality.  Continue telemetry monitoring.  Follow cardiology recommendations   Essential hypertension Currently on Cardizem  drip.  Patient is on amlodipine  and Coreg  at home.  Blood pressure is stable at this time  History of thyroid  nodule status post thyroidectomy Was prescribed Synthroid  but has not taken it.  TSH and free T4 within normal range.  Will not restart at this time.   CAD s/p PCI Chronic congestive heart failure DVT cardiogram with preserved LV function.  Continue Eliquis .  On Plavix , Coreg  and amlodipine  at home  GERD On Protonix   Chronic back pain Continue supportive care  Restless leg  syndrome Continue pramipexole   Hyperlipidemia Continue Lipitor  DVT prophylaxis:  apixaban  (ELIQUIS ) tablet 5 mg   Disposition: Likely home in 1 to 2 days  Status is: Inpatient Remains inpatient appropriate because: Pending clinical improvement, plan for cardioversion    Code Status:     Code Status: Limited: Do not attempt resuscitation (DNR) -DNR-LIMITED -Do Not Intubate/DNI   Family Communication:   Spoke with the patient's spouse on the phone 08/22/2024  Consultants: Cardiology  Procedures: Plan for TEE cardioversion  Anti-infectives:  None  Anti-infectives (From admission, onward)    Start     Dose/Rate Route Frequency Ordered Stop   08/22/24 2200  [MAR Hold]  hydroxychloroquine  (PLAQUENIL ) tablet 200 mg        (MAR Hold since Wed 08/23/2024 at 0851.Hold Reason: Transfer to a Procedural area)   200 mg Oral 2 times daily 08/22/24 1549          Subjective: Today, patient was seen and examined at bedside.  Patient denies interval complaints.  Denies any shortness of breath dyspnea chest pain at this time.  Awaiting for TEE.  Objective: Vitals:   08/23/24 1006 08/23/24 1007  BP:    Pulse: 89 93  Resp: 19 20  Temp:    SpO2: 91% 92%    Intake/Output Summary (Last 24 hours) at 08/23/2024 1137 Last data filed at 08/23/2024 1049 Gross per 24 hour  Intake 579.6 ml  Output 850 ml  Net -270.4 ml   Filed Weights   08/21/24 2207 08/22/24 1850 08/23/24 0853  Weight: 97.5 kg 98.5 kg 98.4 kg   Body mass index is 27.12 kg/m.  Physical Exam:  GENERAL: Patient is alert awake and oriented. Not in obvious distress.  Elderly male, HENT: No scleral pallor or icterus. Pupils equally reactive to light. Oral mucosa is moist NECK: is supple, no gross swelling noted. CHEST: Clear to auscultation. No crackles or wheezes.  Diminished breath sounds bilaterally. CVS: S1 and S2 heard, no murmur.  Irregularly irregular rhythm. ABDOMEN: Soft, non-tender, bowel sounds are  present. EXTREMITIES: No edema. CNS: Cranial nerves are intact. No focal motor deficits. SKIN: warm and dry without rashes.  Data Review: I have personally reviewed the following laboratory data and studies,  CBC: Recent Labs  Lab 08/21/24 2201 08/23/24 0235  WBC 10.5 9.8  HGB 14.4 13.8  HCT 43.7 42.6  MCV 88.3 88.9  PLT 281 268   Basic Metabolic Panel: Recent Labs  Lab 08/21/24 2201 08/22/24 0447 08/23/24 0235  NA 136 139 138  K 3.9 3.7 3.5  CL 101 105 104  CO2 24 26 25   GLUCOSE 153* 107* 108*  BUN 27* 23 23  CREATININE 1.28* 1.10 1.19  CALCIUM  9.1 8.8* 9.0  MG  --  2.1 2.1   Liver Function Tests: No results for input(s): AST, ALT, ALKPHOS, BILITOT, PROT, ALBUMIN in the last 168 hours. No results for input(s): LIPASE, AMYLASE in the last 168 hours. No results for input(s): AMMONIA in the last 168 hours. Cardiac Enzymes: No results for input(s): CKTOTAL, CKMB, CKMBINDEX, TROPONINI in the last 168 hours. BNP (last 3 results) Recent Labs    08/22/24 0057  BNP 169.3*    ProBNP (last 3 results) No results for input(s): PROBNP in the last 8760 hours.  CBG: No results for input(s): GLUCAP in the last 168 hours. No results found for this or any previous visit (from the past 240 hours).   Studies: EP STUDY Result Date: 08/23/2024 See surgical note for result.  ECHOCARDIOGRAM COMPLETE Result Date: 08/22/2024    ECHOCARDIOGRAM REPORT   Patient Name:   Kristopher Wilson Date of Exam: 08/22/2024 Medical Rec #:  [id number]       Height:       75.0 in Accession #:    [id number]      Weight:       215.0 lb Date of Birth:  [birth year]       BSA:          2.263 m Patient Age:    88 years        BP:           113/79 mmHg Patient Gender: M               HR:           104 bpm. Exam Location:  Inpatient Procedure: 2D Echo, Color Doppler, Cardiac Doppler and Intracardiac            Opacification Agent (Both Spectral and Color Flow Doppler were             utilized during procedure). Indications:    Atrial fibrillation 148.91  History:        Patient has no prior history of Echocardiogram examinations.                 CAD; Risk Factors:Hypertension and Former Smoker.  Sonographer:    Merlynn Argyle Referring Phys: [id number] PROSPER M AMPONSAH  Sonographer Comments: Suboptimal subcostal window. Image acquisition challenging due to respiratory motion. IMPRESSIONS  1. Left ventricular ejection fraction, by estimation, is 70 to 75%. The left ventricle  has hyperdynamic function. The left ventricle has no regional wall motion abnormalities. Left ventricular diastolic function could not be evaluated.  2. Right ventricular systolic function is normal. The right ventricular size is normal. Tricuspid regurgitation signal is inadequate for assessing PA pressure.  3. Left atrial size was mildly dilated.  4. The mitral valve is degenerative. No evidence of mitral valve regurgitation. Mild mitral stenosis. The mean mitral valve gradient is 4.0 mmHg. Moderate mitral annular calcification.  5. The aortic valve is tricuspid. There is mild calcification of the aortic valve. There is mild thickening of the aortic valve. Aortic valve regurgitation is not visualized. Aortic valve sclerosis/calcification is present, without any evidence of aortic stenosis.  6. Aortic dilatation noted. There is mild dilatation of the aortic root, measuring 44 mm. FINDINGS  Left Ventricle: Left ventricular ejection fraction, by estimation, is 70 to 75%. The left ventricle has hyperdynamic function. The left ventricle has no regional wall motion abnormalities. Definity  contrast agent was given IV to delineate the left ventricular endocardial borders. The left ventricular internal cavity size was normal in size. There is no left ventricular hypertrophy. Left ventricular diastolic function could not be evaluated due to atrial fibrillation. Left ventricular diastolic function could not be evaluated. Right Ventricle:  The right ventricular size is normal. No increase in right ventricular wall thickness. Right ventricular systolic function is normal. Tricuspid regurgitation signal is inadequate for assessing PA pressure. Left Atrium: Left atrial size was mildly dilated. Right Atrium: Right atrial size was normal in size. Pericardium: There is no evidence of pericardial effusion. Mitral Valve: The mitral valve is degenerative in appearance. Moderate mitral annular calcification. No evidence of mitral valve regurgitation. Mild mitral valve stenosis. MV peak gradient, 8.1 mmHg. The mean mitral valve gradient is 4.0 mmHg. Tricuspid Valve: The tricuspid valve is normal in structure. Tricuspid valve regurgitation is not demonstrated. Aortic Valve: The aortic valve is tricuspid. There is mild calcification of the aortic valve. There is mild thickening of the aortic valve. Aortic valve regurgitation is not visualized. Aortic valve sclerosis/calcification is present, without any evidence of aortic stenosis. Pulmonic Valve: The pulmonic valve was not well visualized. Pulmonic valve regurgitation is not visualized. No evidence of pulmonic stenosis. Aorta: Aortic dilatation noted. There is mild dilatation of the aortic root, measuring 44 mm. IAS/Shunts: The interatrial septum was not well visualized.  LEFT VENTRICLE PLAX 2D LVIDd:         3.70 cm LVIDs:         2.10 cm LV PW:         1.10 cm LV IVS:        1.00 cm LVOT diam:     1.80 cm LV SV:         45 LV SV Index:   20 LVOT Area:     2.54 cm  RIGHT VENTRICLE RV Basal diam:  5.30 cm RV Mid diam:    5.15 cm TAPSE (M-mode): 1.5 cm LEFT ATRIUM           Index        RIGHT ATRIUM           Index LA diam:      4.40 cm 1.94 cm/m   RA Area:     14.60 cm LA Vol (A2C): 48.3 ml 21.34 ml/m  RA Volume:   32.00 ml  14.14 ml/m LA Vol (A4C): 57.2 ml 25.28 ml/m  AORTIC VALVE LVOT Vmax:   95.70 cm/s LVOT Vmean:  61.550 cm/s LVOT  VTI:    0.176 m  AORTA Ao Root diam: 4.40 cm Ao Asc diam:  3.90 cm MITRAL  VALVE MV Area VTI:  1.38 cm   SHUNTS MV Peak grad: 8.1 mmHg   Systemic VTI:  0.18 m MV Mean grad: 4.0 mmHg   Systemic Diam: 1.80 cm MV Vmax:      1.42 m/s MV Vmean:     92.6 cm/s MV VTI:       0.32 m Jerel Croitoru MD Electronically signed by Jerel Balding MD Signature Date/Time: 08/22/2024/11:42:10 AM    Final    DG Chest 2 View Result Date: 08/21/2024 EXAM: 2 VIEW(S) XRAY OF THE CHEST 08/21/2024 09:50:00 PM COMPARISON: None available. CLINICAL HISTORY: Chest pain. FINDINGS: LUNGS AND PLEURA: Linear opacity at the left lung base. No pleural effusion. No pneumothorax. HEART AND MEDIASTINUM: Left coronary artery stent noted. No acute abnormality of the cardiac and mediastinal silhouettes. BONES AND SOFT TISSUES: No acute osseous abnormality. IMPRESSION: 1. Linear opacity at the left lung base, compatible with atelectasis or scarring. Electronically signed by: Oneil Devonshire MD 08/21/2024 10:02 PM EST RP Workstation: MYRTICE Vernal Alstrom, MD  [facility] 08/23/2024  If 7PM-7AM, please contact night-coverage

## 2024-08-23 NOTE — CV Procedure (Signed)
° °  TRANSESOPHAGEAL ECHOCARDIOGRAM GUIDED DIRECT CURRENT CARDIOVERSION  NAME:  Kristopher Wilson    MRN: [id number] DOB:  April 03, 1936    ADMIT DATE: 08/21/2024  INDICATIONS: Symptomatic atrial atrial fibrillation  PROCEDURE:   Informed consent was obtained prior to the procedure. The risks, benefits and alternatives for the procedure were discussed and the patient comprehended these risks.  Risks include, but are not limited to, cough, sore throat, vomiting, nausea, somnolence, esophageal and stomach trauma or perforation, bleeding, low blood pressure, aspiration, pneumonia, infection, trauma to the teeth and death.    After a procedural time-out, the oropharynx was anesthetized and the patient was sedated by the anesthesia service. The transesophageal probe was inserted in the esophagus and stomach without difficulty and multiple views were obtained. Anesthesia was monitored by the anesthesiology team.  COMPLICATIONS:    Complications: No complications Patient tolerated procedure well.  KEY FINDINGS:  Low normal EF, no left atrial appendage clot . Full Report to follow.   CARDIOVERSION:     Indications:  Symptomatic Atrial Fibrillation  Procedure Details:  Once the TEE was complete, the patient had the defibrillator pads placed in the anterior and posterior position. Once an appropriate level of sedation was confirmed, the patient was cardioverted x 1  with 200J of biphasic synchronized energy.  The patient converted to NSR.  There were no apparent complications.  The patient had normal neuro status and respiratory status post procedure with vitals stable as recorded elsewhere.  Adequate airway was maintained throughout and vital signs monitored per protocol.  The was advised to continue anticoagulation for minimum 4 week with interruption.   All questions has been answered at this time.  Ranelle Auker, DO [facility]  [facility]  10:49 AM

## 2024-08-23 NOTE — Anesthesia Preprocedure Evaluation (Signed)
 Anesthesia Evaluation  Patient identified by MRN, date of birth, ID band Patient awake    Reviewed: Allergy & Precautions, NPO status , Patient's Chart, lab work & pertinent test results  Airway Mallampati: II  TM Distance: >3 FB Neck ROM: Full    Dental no notable dental hx.    Pulmonary neg pulmonary ROS, former smoker   Pulmonary exam normal        Cardiovascular hypertension, + CAD and +CHF   Rhythm:Irregular Rate:Normal  IMPRESSIONS   1. Left ventricular ejection fraction, by estimation, is 70 to 75%. The left ventricle has hyperdynamic function. The left ventricle has no regional wall motion abnormalities. Left ventricular diastolic function could not be evaluated.  2. Right ventricular systolic function is normal. The right ventricular size is normal. Tricuspid regurgitation signal is inadequate for assessing PA pressure.  3. Left atrial size was mildly dilated.  4. The mitral valve is degenerative. No evidence of mitral valve regurgitation. Mild mitral stenosis. The mean mitral valve gradient is 4.0 mmHg. Moderate mitral annular calcification.  5. The aortic valve is tricuspid. There is mild calcification of the aortic valve. There is mild thickening of the aortic valve. Aortic valve regurgitation is not visualized. Aortic valve sclerosis/calcification is present, without any evidence of aortic stenosis.  6. Aortic dilatation noted. There is mild dilatation of the aortic root, measuring 44 mm.      Neuro/Psych  Headaches  negative psych ROS   GI/Hepatic Neg liver ROS, PUD,GERD  ,,  Endo/Other    Renal/GU Renal disease  negative genitourinary   Musculoskeletal  (+) Arthritis ,    Abdominal Normal abdominal exam  (+)   Peds  Hematology Lab Results      Component                Value               Date                      WBC                      9.8                 08/23/2024                HGB                       13.8                08/23/2024                HCT                      42.6                08/23/2024                MCV                      88.9                08/23/2024                PLT                      268  08/23/2024              Anesthesia Other Findings   Reproductive/Obstetrics                              Anesthesia Physical Anesthesia Plan  ASA: 3  Anesthesia Plan: MAC   Post-op Pain Management:    Induction: Intravenous  PONV Risk Score and Plan: 1 and Propofol  infusion and Treatment may vary due to age or medical condition  Airway Management Planned: Simple Face Mask and Nasal Cannula  Additional Equipment: None  Intra-op Plan:   Post-operative Plan:   Informed Consent: I have reviewed the patients History and Physical, chart, labs and discussed the procedure including the risks, benefits and alternatives for the proposed anesthesia with the patient or authorized representative who has indicated his/her understanding and acceptance.     Dental advisory given  Plan Discussed with: CRNA  Anesthesia Plan Comments:         Anesthesia Quick Evaluation

## 2024-08-23 NOTE — Progress Notes (Signed)
 Patient with discharge orders.  IVs removed and patient given discharge instructions.  Educated per discharge instructions and verbalized understanding of education.  Patient's son and spouse in room and will transport patient home.

## 2024-08-23 NOTE — Transfer of Care (Signed)
 Immediate Anesthesia Transfer of Care Note  Patient: Kristopher Wilson  Procedure(s) Performed: TRANSESOPHAGEAL ECHOCARDIOGRAM CARDIOVERSION  Patient Location: PACU  Anesthesia Type:General  Level of Consciousness: drowsy  Airway & Oxygen Therapy: Patient connected to nasal cannula oxygen  Post-op Assessment: Report given to RN and Post -op Vital signs reviewed and stable  Post vital signs: Reviewed and stable  Last Vitals:  Vitals Value Taken Time  BP    Temp    Pulse    Resp    SpO2      Last Pain:  Vitals:   08/23/24 0853  TempSrc: Tympanic  PainSc: 0-No pain         Complications: No notable events documented.

## 2024-08-23 NOTE — Interval H&P Note (Signed)
 History and Physical Interval Note:  08/23/2024 9:08 AM  Kristopher Wilson  has presented today for surgery, with the diagnosis of afib.  The various methods of treatment have been discussed with the patient and family. After consideration of risks, benefits and other options for treatment, the patient has consented to  Procedures: TRANSESOPHAGEAL ECHOCARDIOGRAM (N/A) CARDIOVERSION (N/A) as a surgical intervention.  The patient's history has been reviewed, patient examined, no change in status, stable for surgery.  I have reviewed the patient's chart and labs.  Questions were answered to the patient's satisfaction.     [name]

## 2024-08-23 NOTE — Progress Notes (Signed)
°  Progress Note  Patient Name: Kristopher Wilson Date of Encounter: 08/23/2024 [facility] Cardiologist: Oneil Parchment, MD   Interval Summary   Successful cardioversion, feeling better.  Vital Signs Vitals:   08/23/24 1004 08/23/24 1005 08/23/24 1006 08/23/24 1007  BP:      Pulse: (!) 101 92 89 93  Resp: 14 18 19 20   Temp:      TempSrc:      SpO2: 95% 93% 91% 92%  Weight:      Height:        Intake/Output Summary (Last 24 hours) at 08/23/2024 1206 Last data filed at 08/23/2024 1049 Gross per 24 hour  Intake 579.6 ml  Output 850 ml  Net -270.4 ml      08/23/2024    8:53 AM 08/22/2024    6:50 PM 08/21/2024   10:07 PM  Last 3 Weights  Weight (lbs) 217 lb 217 lb 2.5 oz 215 lb  Weight (kg) 98.431 kg 98.5 kg 97.523 kg      Telemetry/ECG  Normal sinus rhythm now- Personally Reviewed  Physical Exam  GEN: No acute distress.   Neck: No JVD Cardiac: RRR, no murmurs, rubs, or gallops.  Respiratory: Clear to auscultation bilaterally. GI: Soft, nontender, non-distended  MS: No edema  Assessment & Plan   88 year old male with new onset A-fib RVR  A-fib RVR - Successful cardioversion by Dr. Sheena -Continue with Eliquis  5 mg twice a day.  This will be his chronic anticoagulation.  Minimize stroke risk. -Lets go ahead and give him diltiazem  CD 180 mg once a day  CAD - Stable DC the Plavix  since he is on Eliquis .  Prior patent LAD stent and distal RCA branch stent.  We will have close follow-up in atrial fibrillation clinic in the next month or 2.  Okay with discharge.  For questions or updates, please contact [facility] Please consult [email or web address] for contact info under         Signed, Oneil Parchment, MD

## 2024-08-23 NOTE — TOC CM/SW Note (Signed)
 Transition of Care [facility]) - Inpatient Brief Assessment   Patient Details  Name: Kristopher Wilson MRN: [id number] Date of Birth: Sep 26, 1935  Transition of [facility]) CM/SW Contact:    Waddell Barnie Rama, RN Phone Number: 08/23/2024, 2:47 PM   Clinical Narrative: From home with spouse, has PCP and insurance on file, states has no HH services in place at this time or DME at home.  States family member will transport them home at [employer] and family is support system, states gets medications from [facility] on [address]SABRA Hackney self ambulatory.   There are no ICM  needs identified  at this time.  Please place consult for ICM needs.     Transition of Care Asessment: Insurance and Status: Insurance coverage has been reviewed Patient has primary care physician: Yes Home environment has been reviewed: home with wife Prior level of function:: indep Prior/Current Home Services: No current home services Social Drivers of Health Review: SDOH reviewed no interventions necessary Readmission risk has been reviewed: Yes Transition of care needs: no transition of care needs at this time

## 2024-08-23 NOTE — Progress Notes (Signed)
°  Echocardiogram Echocardiogram Transesophageal has been performed.  Kristopher Wilson, RDCS 08/23/2024, 10:48 AM

## 2024-08-23 NOTE — Anesthesia Postprocedure Evaluation (Signed)
 Anesthesia Post Note  Patient: Kristopher Wilson  Procedure(s) Performed: TRANSESOPHAGEAL ECHOCARDIOGRAM CARDIOVERSION     Patient location during evaluation: PACU Anesthesia Type: MAC Level of consciousness: awake and alert Pain management: pain level controlled Vital Signs Assessment: post-procedure vital signs reviewed and stable Respiratory status: spontaneous breathing, nonlabored ventilation, respiratory function stable and patient connected to nasal cannula oxygen Cardiovascular status: stable and blood pressure returned to baseline Postop Assessment: no apparent nausea or vomiting Anesthetic complications: no   No notable events documented.  Last Vitals:  Vitals:   08/23/24 1007 08/23/24 1230  BP:  119/88  Pulse: 93 69  Resp: 20 18  Temp:  (!) 36.4 C  SpO2: 92% 95%    Last Pain:  Vitals:   08/23/24 1230  TempSrc: Oral  PainSc:                  Cordella SQUIBB Byran Bilotti

## 2024-08-25 ENCOUNTER — Encounter: Payer: Self-pay | Admitting: Family Medicine

## 2024-08-25 ENCOUNTER — Ambulatory Visit: Admitting: Family Medicine

## 2024-08-25 VITALS — BP 122/62 | HR 65 | Temp 97.4°F | Ht 75.0 in | Wt 222.0 lb

## 2024-08-25 DIAGNOSIS — Z09 Encounter for follow-up examination after completed treatment for conditions other than malignant neoplasm: Secondary | ICD-10-CM | POA: Diagnosis not present

## 2024-08-25 NOTE — Progress Notes (Signed)
 "  Established Patient Office Visit   Subjective:  Patient ID: Kristopher Wilson, male    DOB: 1936-05-14  Age: 88 y.o. MRN: [id number]  Chief Complaint  Patient presents with   Follow-up    Recent ED visit for A-Fib with RVR    HPI Encounter Diagnoses  Name Primary?   Hospital discharge follow-up Yes   For follow-up of recent hospitalization where he developed atrial fib with RVR while visiting his wife in the hospital.  It was treated with cardioversion, Eliquis  and diltiazem .  Amlodipine  and carvedilol  were discontinued.  He is doing well.  Denies palpitations shortness of breath or chest pain.  He has had no bleeding per rectum, hematuria or hemoptysis.  Follow-up is scheduled with atrial FaBB clinic in a few weeks   Review of Systems  Constitutional: Negative.   HENT: Negative.    Eyes:  Negative for blurred vision, discharge and redness.  Respiratory: Negative.  Negative for hemoptysis and shortness of breath.   Cardiovascular: Negative.  Negative for chest pain and palpitations.  Gastrointestinal:  Negative for abdominal pain and blood in stool.  Genitourinary: Negative.  Negative for hematuria.  Musculoskeletal: Negative.  Negative for myalgias.  Skin:  Negative for rash.  Neurological:  Negative for tingling, loss of consciousness and weakness.  Endo/Heme/Allergies:  Negative for polydipsia.    Current Medications[1]   Objective:     BP 122/62 (BP Location: Left Arm, Patient Position: Sitting, Cuff Size: Normal)   Pulse 65   Temp (!) 97.4 F (36.3 C) (Oral)   Ht 6' 3 (1.905 m)   Wt 222 lb (100.7 kg)   SpO2 96%   BMI 27.75 kg/m    Physical Exam Constitutional:      General: He is not in acute distress.    Appearance: Normal appearance. He is not ill-appearing, toxic-appearing or diaphoretic.  HENT:     Head: Normocephalic and atraumatic.     Right Ear: External ear normal.     Left Ear: External ear normal.  Eyes:     General: No scleral icterus.        Right eye: No discharge.        Left eye: No discharge.     Extraocular Movements: Extraocular movements intact.     Conjunctiva/sclera: Conjunctivae normal.  Cardiovascular:     Rate and Rhythm: Normal rate and regular rhythm.  Pulmonary:     Effort: Pulmonary effort is normal. No respiratory distress.     Breath sounds: No wheezing, rhonchi or rales.  Skin:    General: Skin is warm and dry.  Neurological:     Mental Status: He is alert and oriented to person, place, and time.  Psychiatric:        Mood and Affect: Mood normal.        Behavior: Behavior normal.      No results found for any visits on 08/25/24.    The ASCVD Risk score (Arnett DK, et al., 2019) failed to calculate for the following reasons:   The 2019 ASCVD risk score is only valid for ages 35 to 9   * - Cholesterol units were assumed    Assessment & Plan:   Hospital discharge follow-up    Return Return fasting for appointment on 1/22.SABRA  Has follow-up appointment with atrial fibs clinic in the next few weeks.  Continue all medicines as above.  Elsie Sim Lent, MD    [1]  Current Outpatient Medications:    apixaban  (  ELIQUIS ) 5 MG TABS tablet, Take 1 tablet (5 mg total) by mouth 2 (two) times daily., Disp: 60 tablet, Rfl: 2   atorvastatin  (LIPITOR) 40 MG tablet, TAKE 1 TABLET DAILY, Disp: 90 tablet, Rfl: 1   Calcium  Carbonate (CALCIUM  500 PO), Take 1 tablet by mouth in the morning., Disp: , Rfl:    clopidogrel  (PLAVIX ) 75 MG tablet, TAKE 1 TABLET DAILY, Disp: 90 tablet, Rfl: 1   cyanocobalamin  (VITAMIN B12) 1000 MCG tablet, Take 1 tablet (1,000 mcg total) by mouth daily., Disp: 90 tablet, Rfl: 1   dapagliflozin  propanediol (FARXIGA ) 10 MG TABS tablet, TAKE 1 TABLET DAILY BEFORE BREAKFAST, Disp: 90 tablet, Rfl: 1   diltiazem  (CARDIZEM  CD) 180 MG 24 hr capsule, Take 1 capsule (180 mg total) by mouth daily., Disp: 30 capsule, Rfl: 2   fenofibrate  (TRICOR ) 145 MG tablet, Take 1 tablet (145 mg total)  by mouth daily., Disp: 90 tablet, Rfl: 0   hydrochlorothiazide  (HYDRODIURIL ) 25 MG tablet, Take 1 tablet (25 mg total) by mouth daily., Disp: 90 tablet, Rfl: 3   hydroxychloroquine  (PLAQUENIL ) 200 MG tablet, Take 200 mg by mouth 2 (two) times daily., Disp: , Rfl:    isosorbide  mononitrate (IMDUR ) 60 MG 24 hr tablet, TAKE 1 TABLET BY MOUTH EVERY DAY, Disp: 90 tablet, Rfl: 1   ketoconazole  (NIZORAL ) 2 % cream, Apply 1 Application topically daily., Disp: , Rfl:    nitroGLYCERIN  (NITROSTAT ) 0.4 MG SL tablet, Place 1 tablet (0.4 mg total) under the tongue every 5 (five) minutes as needed for chest pain., Disp: 25 tablet, Rfl: 6   pantoprazole  (PROTONIX ) 40 MG tablet, Take 1 tablet (40 mg total) by mouth daily., Disp: 90 tablet, Rfl: 1   pramipexole  (MIRAPEX ) 1 MG tablet, Take 1 tablet (1 mg total) by mouth at bedtime., Disp: 90 tablet, Rfl: 1   predniSONE  (DELTASONE ) 5 MG tablet, Take 5 mg by mouth daily with breakfast. Patient following a specific course to wean off medication, Disp: , Rfl:    Vitamin D , Ergocalciferol , (DRISDOL ) 1.25 MG (50000 UNIT) CAPS capsule, Take 1 capsule (50,000 Units total) by mouth every 7 (seven) days. (Patient taking differently: Take 50,000 Units by mouth every Monday.), Disp: 12 capsule, Rfl: 1   acetaminophen  (TYLENOL ) 325 MG tablet, Take 650 mg by mouth every 6 (six) hours as needed for headache., Disp: , Rfl:    levothyroxine  (SYNTHROID ) 25 MCG tablet, Take 1 tablet (25 mcg total) by mouth daily. (Patient not taking: Reported on 08/25/2024), Disp: 90 tablet, Rfl: 0  "

## 2024-09-05 ENCOUNTER — Encounter: Payer: Self-pay | Admitting: Podiatry

## 2024-09-12 ENCOUNTER — Ambulatory Visit: Admitting: Podiatry

## 2024-09-12 VITALS — Ht 75.0 in | Wt 222.0 lb

## 2024-09-12 DIAGNOSIS — M21962 Unspecified acquired deformity of left lower leg: Secondary | ICD-10-CM

## 2024-09-12 DIAGNOSIS — L97522 Non-pressure chronic ulcer of other part of left foot with fat layer exposed: Secondary | ICD-10-CM | POA: Diagnosis not present

## 2024-09-12 NOTE — Patient Instructions (Signed)
 Preparing for Surgery      Thank you for choosing [facility] for your surgical care. Our board-certified and board-qualified physicians bring advanced training and a deep commitment to the highest standards in foot and ankle surgery.   From your first consultation to your final steps in recovery, our team is here to ensure you feel informed, supported, and confident throughout the entire process.   Visit [email or web address] to access our Surgery Patient page, where youll find all of the information listed below, plus additional helpful resources.      The Time of Your Surgery   For hospital procedures, your surgery time will be provided at the time of scheduling. If youre scheduled at [facility], youll receive a call from the surgical center 24 hours before your procedure with your confirmed time.?Please refer to the surgery information provided to you during your surgical consultation to determine where your surgery will take place.  Recommended Devices for Easier Recovery  To support a smoother recovery, weve gathered a list of helpful, recommended equipment. Visit [email or web address] to see the full list.       Taking Medications?   If you are taking daily heart and blood pressure medications, seizure, reflux, allergy, asthma, anxiety, pain, or diabetes medications, make sure you notify the surgery center/hospital before the day of surgery so they can tell you which medications you should take or avoid the day of surgery.    GLP-1 antagonists taken weekly should be stopped for 7 days before surgery. GLP-1 antagonists taken daily be stopped on the day of surgery. These include:   Dulaglutide (Trulicity)   Exenatide extended release (Bydureon BCise)   Exenatide (Byetta)   Semaglutide (Ozempic; Birch.brandt)   Tirzepatide (Mounjaro)   Liraglutide (Victoza, Saxenda)   Lixisenatide (Adlyxin)   Phentermine (Adipex-P,  Lomaira)   Semaglutide (Rybelsus) is oral and should be held for 3 days.   Metformin - should be held for 2 days prior to surgery.   SGLT2 inhibitors should be stopped for 3 days (no longer because of the risk of euglycemic diabetic ketoacidosis) and include:   Canagliflozin (Invokana)   Ertugliflozin (Steglatro)   Dapagliflozin (Farxiga)   Empagliflozin (Jardiance)   Additional medications to stop:   If you take blood thinners (Warfarin, Eliquis, Xarelto), please consult your doctor about stopping before your procedure.   Aspirin: Consult your doctor about stopping 1 week before your procedure.   Anti-inflammatory medications (such as ibuprofen)        Pre-Operative Instructions   Plan to be at the hospital at least an hour and a half (1.5), or at the surgery center (1) hour before your scheduled time, unless otherwise directed by the surgical center/hospital staff. You must have a responsible adult accompany you, remain during the surgery, and drive you home. Make sure you have directions to the surgical center/hospital to ensure you arrive on time.       [facility] [place]             [address]    [address]          [place], [place] [address]    [place], [place] [address]                 [phone]      [facility]   [facility] [address] [place]         [address]                [address]          [place], [place] [address]                            [place], [place] [address]     [facility]         [address]          [place], [place] [address]      If you are having surgery at [facility] or [facility], you will need a copy of your medical history and physical form from your family physician within one month before the date of surgery. We will give you a form for your primary physician to complete.       We will make every effort to accommodate the date you  request for surgery. However, there are times when surgery dates or times need to be moved. We will contact you as soon as possible if a schedule change is required.     No aspirin/ibuprofen for one week before surgery. If you are on aspirin, any non-steroidal anti-inflammatory medications (Mobic, Aleve, Ibuprofen) should not be taken seven (7) days before surgery.       No food or drink after midnight the night before surgery unless directed otherwise by the surgical center/hospital staff. If you are having a surgical procedure in our office, and not in the surgical center or hospital, this does not apply to you.     No alcoholic beverages 24 hours before surgery. No smoking 24 hours before or 24 hours after surgery.      Wear loose pants or shorts. They should be loose enough to fit over bandages, boots, and casts.     Do not wear slip-on shoes. Sneakers are preferred.      If you were given a boot during your surgery consultation appointment, be sure to bring it with you to the surgery center/hospital on the day of your surgery. Also, bring crutches, a knee scooter, or a walker if your physician prescribed it for you before your surgery date.       If you have not been contacted by the surgery center/hospital by the day before your surgery, call the surgery center/hospital to confirm the date and time of your surgery.       Leave time from work may vary depending on the type of surgery you have. Appropriate arrangements should be made before surgery with your employer.      Prescriptions will be electronically submitted to your pharmacy the evening before your surgery or the day of your surgery. Take the medication as directed. Pain medications will not be refilled on weekends and must be approved by the doctor.       Remove nail polish on the operative foot and avoid getting a pedicure two weeks before surgery.       The night before surgery, wash the foot and leg that is being  operated on with water and the antibacterial soap that was provided at your consultation appointment. The antibacterial soap is encased in the brush. You will need to wet the brush to cleanse the area. Be sure to pay special attention to beneath the toenails and in between the toes. Wash for at least three (3) minutes. Rinse thoroughly with water and pat dry with a towel. Perform this wash unless  told not to do so by your physician.    If you have any questions regarding the scheduling of your surgery, please contact our surgical department at [phone]   If you have any questions regarding any of these instructions, please do not hesitate to call our triage nurse at [phone]

## 2024-09-12 NOTE — Progress Notes (Signed)
"  °  Subjective:  Patient ID: Kristopher Wilson, male    DOB: 10/25/1935,  MRN: [id number]  Chief Complaint  Patient presents with   Wound Check    RM 3 Patient is here for to f/u ulcer of the left foot. Ulcer is scabbed over with visible drainage. Pt states pain on lateral side of left foot when walking due to ulcer.    89 y.o. male presents with the above complaint. History confirmed with patient.  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      Plan:  Patient was evaluated and treated and all questions answered.  Ulcer left foot Debrided the overlying skin, discussed surgical intervention with osteotomy to offload the metatarsal to prevent recurrence.  We discussed the risk benefits and potential complications.  All questions addressed.  Informed consent signed and reviewed.  Surgery be scheduled for early spring, he has an upcoming cruise on March 5 and we will plan for surgery when he returns.  Return in 3 weeks to reevaluate wound to ensure that it does not recur to a deep ulceration.  Dancer pad applied to additional shoes today.   Surgical plan:  Procedure: - Left fifth metatarsal osteotomy  Location: - [facility]  Anesthesia plan: - With local  Postoperative pain plan: - Tylenol  1000 mg every 6 hours, oxycodone  5 mg 1-2 tabs every 6 hours only as needed  DVT prophylaxis: - He will resume his Eliquis  postop  WB  Restrictions / DME needs: - Weightbearing as tolerated in surgical shoe postop    Return in about 3 weeks (around 10/03/2024) for wound check , wound care.   "

## 2024-09-17 ENCOUNTER — Other Ambulatory Visit (HOSPITAL_COMMUNITY): Payer: Self-pay

## 2024-09-18 ENCOUNTER — Encounter: Payer: Self-pay | Admitting: Cardiology

## 2024-09-19 MED ORDER — ISOSORBIDE MONONITRATE ER 60 MG PO TB24: 60.0000 mg | ORAL_TABLET | Freq: Every day | ORAL | 0 refills | Status: AC

## 2024-09-25 ENCOUNTER — Ambulatory Visit (HOSPITAL_COMMUNITY): Payer: Self-pay | Admitting: Nurse Practitioner

## 2024-09-25 ENCOUNTER — Encounter (HOSPITAL_COMMUNITY): Payer: Self-pay | Admitting: Nurse Practitioner

## 2024-09-25 ENCOUNTER — Ambulatory Visit (HOSPITAL_COMMUNITY)
Admission: RE | Admit: 2024-09-25 | Discharge: 2024-09-25 | Disposition: A | Source: Ambulatory Visit | Attending: Nurse Practitioner

## 2024-09-25 ENCOUNTER — Ambulatory Visit (HOSPITAL_COMMUNITY)
Admission: RE | Admit: 2024-09-25 | Discharge: 2024-09-25 | Disposition: A | Discharge status: Home / Self Care | Source: Ambulatory Visit | Attending: Nurse Practitioner | Admitting: Nurse Practitioner

## 2024-09-25 ENCOUNTER — Other Ambulatory Visit (HOSPITAL_COMMUNITY): Payer: Self-pay | Admitting: *Deleted

## 2024-09-25 ENCOUNTER — Encounter (HOSPITAL_COMMUNITY): Payer: Self-pay

## 2024-09-25 VITALS — BP 146/76 | HR 81 | Ht 75.0 in | Wt 224.6 lb

## 2024-09-25 DIAGNOSIS — I480 Paroxysmal atrial fibrillation: Secondary | ICD-10-CM

## 2024-09-25 DIAGNOSIS — I4891 Unspecified atrial fibrillation: Secondary | ICD-10-CM | POA: Diagnosis not present

## 2024-09-25 LAB — CBC
Hematocrit: 39.8 % (ref 37.5–51.0)
Hemoglobin: 13.6 g/dL (ref 13.0–17.7)
MCH: 29 pg (ref 26.6–33.0)
MCHC: 34.2 g/dL (ref 31.5–35.7)
MCV: 85 fL (ref 79–97)
Platelets: 277 x10E3/uL (ref 150–450)
RBC: 4.69 x10E6/uL (ref 4.14–5.80)
RDW: 15.2 % (ref 11.6–15.4)
WBC: 10 x10E3/uL (ref 3.4–10.8)

## 2024-09-25 LAB — BASIC METABOLIC PANEL WITH GFR
BUN/Creatinine Ratio: 21 (ref 10–24)
BUN: 33 mg/dL — ABNORMAL HIGH (ref 8–27)
CO2: 29 mmol/L (ref 20–29)
Calcium: 9.6 mg/dL (ref 8.6–10.2)
Chloride: 99 mmol/L (ref 96–106)
Creatinine, Ser: 1.54 mg/dL — ABNORMAL HIGH (ref 0.76–1.27)
Glucose: 119 mg/dL — ABNORMAL HIGH (ref 70–99)
Potassium: 3.6 mmol/L (ref 3.5–5.2)
Sodium: 138 mmol/L (ref 134–144)
eGFR: 44 mL/min/1.73 — ABNORMAL LOW

## 2024-09-25 NOTE — Patient Instructions (Signed)
 [employer] mobile (1 lead)

## 2024-09-25 NOTE — Progress Notes (Signed)
 "  Primary Care Physician: Berneta Elsie Sayre, MD Primary Cardiologist: Oneil Parchment, MD Electrophysiologist: None  Referring Physician: Oneil Parchment, MD  Kristopher Wilson is a 89 y.o. male with a history of paroxysmal AF (on Eliquis ), CAD s/p LHC 2000 with BMS to LAD and repeat PCI 2009 with DES to PLV, HTN, HLD, prostate CA s/p prostatectomy, left thyroid  nodule s/p thyroidectomy, DM type II, HFpEF, CKD, bilateral carotid artery stenosis, venous insufficiency who presents for follow up in the [facility].  The patient was initially diagnosed with atrial fibrillation on 08/21/2024 while his wife was in the hospital recovering from hip and rib fractures.  He reported h complaints of chest pain and was started on Cardizem  drip with resolution of discomfort.  EKG completed showing AF with RVR.  He underwent TEE/DCCV with conversion to sinus rhythm on 08/23/2024.  TTE was completed with hyperdynamic EF of 70-75% with no RWMA and mildly dilated LA with mild mitral stenosis and moderate mitral annular calcification, mild dilation of the aortic root (44 mm).  He was started on Eliquis  for CHA2DS2-VASc score of 6 and discharged on 08/23/2024 with Cardizem  180 mg daily with amlodipine  and carvedilol  discontinued.  He presents today for follow-up in the AF clinic.  [name] presents today for posthospital follow-up. He has been stable since the cardioversion and was unable to identify when his heart was out of rhythm prior to the procedure. He is currently on Eliquis  and Plavix  due to his risk factors for stroke. He has not experienced any issues with Eliquis  but has noticed dark stools that began about a week ago. He has not taken any medications like Pepto Bismol that could cause dark stools.  We discussed triggers for atrial fibrillation such as dehydration, stress, alcohol, and tobacco.  He uses a pulse oximeter at home to monitor his heart rate and oxygen levels and has not been  aware of any episodes of atrial fibrillation since the cardioversion. He has not missed any doses of Eliquis . He has a history of prostate cancer and underwent prostatectomy, which causes him to wake up frequently at night to urinate. He does not sleep well and naps during the day. He denies being diagnosed with sleep apnea but acknowledges poor sleep quality. No issues with urination, such as hematuria, and he has not felt tired or cold, which could indicate anemia. Today, he denies symptoms of palpitations, chest pain, shortness of breath, orthopnea, PND, lower extremity edema, dizziness, presyncope, syncope, snoring, daytime somnolence, bleeding, or neurologic sequela. The patient is tolerating medications without difficulties and is otherwise without complaint today.   Discussed the use of AI scribe software for clinical note transcription with the patient, who gave verbal consent to proceed.   Notes: History of thyroidectomy, CAD, HFpEF and CKD -May try Multaq-need EKG IN ONE WEEK - Amiodarone, Multaq?,  No class Ic agents -CrCl: 63 mL/min-250 mcg -QT , QRS:98, and PR:180 - He is on Plavix  and Eliquis  due to history of CAD  Atrial Fibrillation Management history: Previous antiarrhythmic drugs: None Previous cardioversions: 08/2024 Previous ablations: None Anticoagulation history: None  ROS- All systems are reviewed and negative except as per the HPI above.  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   Pre-diabetes    PROSTATE CANCER, HX OF 07/07/2007   s/p prostatectomy   RLS (restless legs syndrome)    S/P skin biopsy 06/12/2019   Ulcer    ULCERATIVE COLITIS, LEFT SIDED 2000   URTICARIA, ALLERGIC 08/05/2009   VENOUS INSUFFICIENCY 07/28/2010   Past Surgical History:  Procedure Laterality Date   ANGIOPLASTY  1990's/2004   stent placement 2   CARDIOVERSION N/A 08/23/2024   Procedure: CARDIOVERSION;  Surgeon: Sheena Pugh, DO;  Location: [facility];  Service: Cardiovascular;  Laterality: N/A;   COLONOSCOPY W/ BIOPSIES AND POLYPECTOMY  06/06/2009   adenomatous polyp, diverticulosis, colitis   ENDOVENOUS ABLATION SAPHENOUS VEIN W/ LASER Left 07/09/2021   endovenous laser ablation left greater saphenous vein and stab phlebectomy 10-20 incisions left leg by Medford Blade MD   INGUINAL HERNIA REPAIR     x x   ORIF FIBULA FRACTURE Left 03/02/2017   Procedure: OPEN REDUCTION INTERNAL FIXATION (ORIF) LEFT DISTAL FIBULA FRACTURE WITH DELTOID LIGAMENT REPAIR;  Surgeon: Anderson Maude ORN, MD;  Location: [facility] OR;  Service: Orthopedics;  Laterality: Left;   PROSTATECTOMY     RETINAL LASER PROCEDURE Right 2016 & 2017    ROTATOR CUFF REPAIR Left    SKIN BIOPSY  02/26/2020   Left Dorsum Bridge of Nose and Right Upper Abdomen   THYROID  LOBECTOMY Left 01/17/2024   Procedure: LOBECTOMY, THYROID ;  Surgeon: Eletha Boas, MD;  Location: [facility];  Service: General;  Laterality: Left;   TONSILLECTOMY     TRANSESOPHAGEAL ECHOCARDIOGRAM (CATH LAB) N/A 08/23/2024   Procedure: TRANSESOPHAGEAL ECHOCARDIOGRAM;  Surgeon: Sheena Pugh, DO;  Location: [facility];  Service: Cardiovascular;  Laterality: N/A;   Keflex [cephalexin] Current Outpatient Medications  Medication Sig Dispense Refill   acetaminophen  (TYLENOL ) 325 MG tablet Take 650 mg by mouth every 6 (six) hours as needed for  headache.     apixaban  (ELIQUIS ) 5 MG TABS tablet Take 1 tablet (5 mg total) by mouth 2 (two) times daily. 60 tablet 2   atorvastatin  (LIPITOR) 40 MG tablet TAKE 1 TABLET DAILY 90 tablet 1   Calcium  Carbonate (CALCIUM  500 PO) Take 1 tablet by mouth in the morning.     clopidogrel  (PLAVIX ) 75 MG tablet TAKE 1 TABLET DAILY 90 tablet 1   cyanocobalamin  (VITAMIN B12) 1000 MCG tablet Take 1 tablet (1,000 mcg total) by mouth daily. 90 tablet 1   dapagliflozin  propanediol (FARXIGA ) 10 MG TABS tablet TAKE 1 TABLET DAILY BEFORE BREAKFAST 90 tablet 1   diltiazem  (CARDIZEM  CD) 180 MG 24 hr capsule Take 1 capsule (180 mg total) by mouth daily. 30 capsule 2   fenofibrate  (TRICOR ) 145 MG tablet Take 1 tablet (145 mg total) by mouth daily. 90 tablet 0   hydrochlorothiazide  (HYDRODIURIL ) 25 MG tablet Take 1 tablet (25 mg total) by mouth daily. 90 tablet 3   hydroxychloroquine  (PLAQUENIL ) 200 MG tablet Take 200 mg by mouth 2 (two) times daily.     isosorbide  mononitrate (IMDUR ) 60 MG 24 hr tablet Take 1 tablet (60 mg total)  by mouth daily. 90 tablet 0   ketoconazole  (NIZORAL ) 2 % cream Apply 1 Application topically daily.     nitroGLYCERIN  (NITROSTAT ) 0.4 MG SL tablet Place 1 tablet (0.4 mg total) under the tongue every 5 (five) minutes as needed for chest pain. 25 tablet 6   pantoprazole  (PROTONIX ) 40 MG tablet Take 1 tablet (40 mg total) by mouth daily. 90 tablet 1   pramipexole  (MIRAPEX ) 1 MG tablet Take 1 tablet (1 mg total) by mouth at bedtime. 90 tablet 1   predniSONE  (DELTASONE ) 5 MG tablet Take 5 mg by mouth daily with breakfast. Patient following a specific course to wean off medication     Vitamin D , Ergocalciferol , (DRISDOL ) 1.25 MG (50000 UNIT) CAPS capsule Take 1 capsule (50,000 Units total) by mouth every 7 (seven) days. (Patient taking differently: Take 50,000 Units by mouth every Monday.) 12 capsule 1   No current facility-administered medications for this encounter.    Physical Exam: BP (!)  146/76   Pulse 81   Ht 6' 3 (1.905 m)   Wt 101.9 kg   BMI 28.07 kg/m   GEN: Well nourished, well developed in no acute distress NECK: No JVD; No carotid bruits CARDIAC: Regular rate and rhythm, no murmurs, rubs, gallops RESPIRATORY:  Clear to auscultation without rales, wheezing or rhonchi  ABDOMEN: Soft, non-tender, non-distended EXTREMITIES:  No edema; No deformity   Wt Readings from Last 3 Encounters:  09/25/24 101.9 kg  09/12/24 100.7 kg  08/25/24 100.7 kg    Lab Results  Component Value Date   TSH 1.761 08/21/2024   EKG today demonstrates:   EKG Interpretation Date/Time:  Monday September 25 2024 10:38:09 EST Ventricular Rate:  81 PR Interval:  164 QRS Duration:  100 QT Interval:  402 QTC Calculation: 466 R Axis:   71  Text Interpretation: Normal sinus rhythm Normal ECG When compared with ECG of 23-Aug-2024 10:48, No significant change was found Confirmed by Wyn Manus [phone]) on 09/25/2024 11:40:35 AM        Echo Completed 08/22/2024: 1. Left ventricular ejection fraction, by estimation, is 70 to 75%. The  left ventricle has hyperdynamic function. The left ventricle has no  regional wall motion abnormalities. Left ventricular diastolic function  could not be evaluated.   2. Right ventricular systolic function is normal. The right ventricular  size is normal. Tricuspid regurgitation signal is inadequate for assessing  PA pressure.   3. Left atrial size was mildly dilated.   4. The mitral valve is degenerative. No evidence of mitral valve  regurgitation. Mild mitral stenosis. The mean mitral valve gradient is 4.0  mmHg. Moderate mitral annular calcification.   5. The aortic valve is tricuspid. There is mild calcification of the  aortic valve. There is mild thickening of the aortic valve. Aortic valve  regurgitation is not visualized. Aortic valve sclerosis/calcification is  present, without any evidence of  aortic stenosis.   6. Aortic dilatation noted.  There is mild dilatation of the aortic root,  measuring 44 mm.    CHA2DS2-VASc Score = 6  The patient's score is based upon: CHF History: 1 HTN History: 1 Diabetes History: 1 Stroke History: 0 Vascular Disease History: 1 Age Score: 2 Gender Score: 0       ASSESSMENT AND PLAN: Paroxysmal Atrial Fibrillation (ICD10:  I48.0) The patient's CHA2DS2-VASc score is 6, indicating a 9.7% annual risk of stroke.   -s/p TEE/DCCV in the setting of new onset AF on 08/22/2024 and started on Eliquis  and  Cardizem  for rate control -Post-cardioversion, regular rhythm, asymptomatic. Discussed AFib nature, triggers, management, and treatment options including ablation and medications. -Emphasized rate and rhythm control, lifestyle modifications. - Ordered Zio monitor for two weeks to evaluate AF burden. - Recommended Kardia[employer] for home rhythm monitoring. - Continue diltiazem  for rate control. - Educated on lifestyle modifications, including limiting alcohol and caffeine. - Patient reports dark stools since initiating Eliquis  -Check CBC and BMET today day  History of CAD: - s/p LHC with BMS to LAD 2000 and DES to PLV in 2009 -Today patient reports no chest pain or angina since hospitalization. Continue Plavix  75 mg daily  HFpEF: - Recent 2D echo shows hyperdynamic EF of 70-75% -Today patient is euvolemic on exam denies any shortness of breath.  HTN: BP well controlled. Continue current antihypertensive regimen.   Hypercoagulable state: - Patient has noticed dark stools over the past week - We will check CBC today - Continue Eliquis  5 mg twice daily for now.  Signed,  Wyn Raddle, Jackee Shove, NP    09/25/2024 11:44 AM    Follow up with the AF Clinic in 1 month    "

## 2024-09-28 ENCOUNTER — Ambulatory Visit: Payer: Self-pay | Admitting: Family Medicine

## 2024-09-28 ENCOUNTER — Ambulatory Visit: Admitting: Family Medicine

## 2024-09-28 ENCOUNTER — Encounter: Payer: Self-pay | Admitting: Family Medicine

## 2024-09-28 VITALS — BP 118/70 | HR 83 | Temp 97.9°F | Ht 75.0 in | Wt 222.6 lb

## 2024-09-28 DIAGNOSIS — E559 Vitamin D deficiency, unspecified: Secondary | ICD-10-CM

## 2024-09-28 DIAGNOSIS — R946 Abnormal results of thyroid function studies: Secondary | ICD-10-CM | POA: Diagnosis not present

## 2024-09-28 DIAGNOSIS — R7989 Other specified abnormal findings of blood chemistry: Secondary | ICD-10-CM | POA: Diagnosis not present

## 2024-09-28 DIAGNOSIS — R7303 Prediabetes: Secondary | ICD-10-CM

## 2024-09-28 DIAGNOSIS — E538 Deficiency of other specified B group vitamins: Secondary | ICD-10-CM | POA: Diagnosis not present

## 2024-09-28 LAB — VITAMIN B12: Vitamin B-12: 536 pg/mL (ref 211–911)

## 2024-09-28 LAB — TSH: TSH: 3.81 u[IU]/mL (ref 0.35–5.50)

## 2024-09-28 NOTE — Progress Notes (Signed)
 "  Established Patient Office Visit   Subjective:  Patient ID: Kristopher Wilson, male    DOB: 1935-09-24  Age: 89 y.o. MRN: [id number]  Chief Complaint  Patient presents with   Medical Management of Chronic Issues    Ten week follow-up     HPI Encounter Diagnoses  Name Primary?   B12 deficiency Yes   Pre-diabetes    Elevated TSH    For follow up of above.  Last TSH check was normal.  We held start up levothyroxine .  Prediabetes well-controlled with Farxiga  cardiology had started for CHF.  Continue cyanocobalamin  mean for B12 deficiency.   Review of Systems  Constitutional: Negative.   HENT: Negative.    Eyes:  Negative for blurred vision, discharge and redness.  Respiratory: Negative.    Cardiovascular: Negative.   Gastrointestinal:  Negative for abdominal pain.  Genitourinary: Negative.   Musculoskeletal: Negative.  Negative for myalgias.  Skin:  Negative for rash.  Neurological:  Negative for tingling, loss of consciousness and weakness.  Endo/Heme/Allergies:  Negative for polydipsia.    Current Medications[1]   Objective:     BP 118/70   Pulse 83   Temp 97.9 F (36.6 C)   Ht 6' 3 (1.905 m)   Wt 222 lb 9.6 oz (101 kg)   SpO2 97%   BMI 27.82 kg/m    Physical Exam Constitutional:      General: He is not in acute distress.    Appearance: Normal appearance. He is not ill-appearing, toxic-appearing or diaphoretic.  HENT:     Head: Normocephalic and atraumatic.     Right Ear: External ear normal.     Left Ear: External ear normal.  Eyes:     General: No scleral icterus.       Right eye: No discharge.        Left eye: No discharge.     Extraocular Movements: Extraocular movements intact.     Conjunctiva/sclera: Conjunctivae normal.  Cardiovascular:     Rate and Rhythm: Normal rate and regular rhythm.  Pulmonary:     Effort: Pulmonary effort is normal. No respiratory distress.     Breath sounds: Normal breath sounds. No wheezing or rales.   Musculoskeletal:     Cervical back: No rigidity or tenderness.  Lymphadenopathy:     Cervical: No cervical adenopathy.  Skin:    General: Skin is warm and dry.  Neurological:     Mental Status: He is alert and oriented to person, place, and time.  Psychiatric:        Mood and Affect: Mood normal.        Behavior: Behavior normal.      No results found for any visits on 09/28/24.    The ASCVD Risk score (Arnett DK, et al., 2019) failed to calculate for the following reasons:   The 2019 ASCVD risk score is only valid for ages 15 to 42   * - Cholesterol units were assumed    Assessment & Plan:   B12 deficiency -     Vitamin B12  Pre-diabetes  Elevated TSH -     TSH    Return in about 4 months (around 01/26/2025).    Elsie Sim Lent, MD    [1]  Current Outpatient Medications:    acetaminophen  (TYLENOL ) 325 MG tablet, Take 650 mg by mouth every 6 (six) hours as needed for headache., Disp: , Rfl:    apixaban  (ELIQUIS ) 5 MG TABS tablet, Take 1 tablet (5 mg  total) by mouth 2 (two) times daily., Disp: 60 tablet, Rfl: 2   atorvastatin  (LIPITOR) 40 MG tablet, TAKE 1 TABLET DAILY, Disp: 90 tablet, Rfl: 1   Calcium  Carbonate (CALCIUM  500 PO), Take 1 tablet by mouth in the morning., Disp: , Rfl:    clopidogrel  (PLAVIX ) 75 MG tablet, TAKE 1 TABLET DAILY, Disp: 90 tablet, Rfl: 1   cyanocobalamin  (VITAMIN B12) 1000 MCG tablet, Take 1 tablet (1,000 mcg total) by mouth daily., Disp: 90 tablet, Rfl: 1   dapagliflozin  propanediol (FARXIGA ) 10 MG TABS tablet, TAKE 1 TABLET DAILY BEFORE BREAKFAST, Disp: 90 tablet, Rfl: 1   diltiazem  (CARDIZEM  CD) 180 MG 24 hr capsule, Take 1 capsule (180 mg total) by mouth daily., Disp: 30 capsule, Rfl: 2   fenofibrate  (TRICOR ) 145 MG tablet, Take 1 tablet (145 mg total) by mouth daily., Disp: 90 tablet, Rfl: 0   hydrochlorothiazide  (HYDRODIURIL ) 25 MG tablet, Take 1 tablet (25 mg total) by mouth daily., Disp: 90 tablet, Rfl: 3    hydroxychloroquine  (PLAQUENIL ) 200 MG tablet, Take 200 mg by mouth 2 (two) times daily., Disp: , Rfl:    isosorbide  mononitrate (IMDUR ) 60 MG 24 hr tablet, Take 1 tablet (60 mg total) by mouth daily., Disp: 90 tablet, Rfl: 0   ketoconazole  (NIZORAL ) 2 % cream, Apply 1 Application topically daily., Disp: , Rfl:    nitroGLYCERIN  (NITROSTAT ) 0.4 MG SL tablet, Place 1 tablet (0.4 mg total) under the tongue every 5 (five) minutes as needed for chest pain., Disp: 25 tablet, Rfl: 6   pantoprazole  (PROTONIX ) 40 MG tablet, Take 1 tablet (40 mg total) by mouth daily., Disp: 90 tablet, Rfl: 1   pramipexole  (MIRAPEX ) 1 MG tablet, Take 1 tablet (1 mg total) by mouth at bedtime., Disp: 90 tablet, Rfl: 1   predniSONE  (DELTASONE ) 1 MG tablet, Take 4 mg by mouth daily., Disp: , Rfl:    predniSONE  (DELTASONE ) 5 MG tablet, Take 5 mg by mouth daily with breakfast. Patient following a specific course to wean off medication, Disp: , Rfl:    Vitamin D , Ergocalciferol , (DRISDOL ) 1.25 MG (50000 UNIT) CAPS capsule, Take 1 capsule (50,000 Units total) by mouth every 7 (seven) days. (Patient taking differently: Take 50,000 Units by mouth every Monday.), Disp: 12 capsule, Rfl: 1  "

## 2024-10-03 ENCOUNTER — Encounter: Payer: Self-pay | Admitting: Podiatry

## 2024-10-03 ENCOUNTER — Ambulatory Visit: Admitting: Podiatry

## 2024-10-03 VITALS — Ht 75.0 in | Wt 222.0 lb

## 2024-10-03 DIAGNOSIS — L97521 Non-pressure chronic ulcer of other part of left foot limited to breakdown of skin: Secondary | ICD-10-CM | POA: Diagnosis not present

## 2024-10-03 NOTE — Progress Notes (Signed)
"  °  Subjective:  Patient ID: Kristopher Wilson, male    DOB: [birth year],  MRN: [id number]  Chief Complaint  Patient presents with   Wound Check    RM 3 Patient is here for ulcer of the left foot. Wound is closed with visible drainage.    89 y.o. male presents with the above complaint. History confirmed with patient.  He returns for follow-up today has not noted any drainage  Objective:  Physical Exam: warm, good capillary refill, no trophic changes or ulcerative lesions, normal sensory exam, ulceration at submetatarsal 5 left has partial-thickness skin breakdown without signs of infection or drainage no exposed subcutaneous tissue dermis is intact     ABI 03/16/2024 on the left 1.14 TBI 0.92 with a toe pressure of 130   MRI showed osteitis without definitive infection frontal plane view does show prominent bone deep to the ulceration  Assessment:   1. Ulcer of left foot, limited to breakdown of skin [facility])         Plan:  Patient was evaluated and treated and all questions answered.  Ulcer left foot Overlying skin and callus debrided.  No recurrence of ulceration no signs of infection.  Return in 3 weeks to debride once more.  Surgery scheduled for March for fifth metatarsal osteotomy for long-term relief.  Surgical plan:  Procedure: - Left fifth metatarsal osteotomy  Location: - [facility]  Anesthesia plan: - With local  Postoperative pain plan: - Tylenol  1000 mg every 6 hours, oxycodone  5 mg 1-2 tabs every 6 hours only as needed  DVT prophylaxis: - He will resume his Eliquis  postop  WB Restrictions / DME needs: - Weightbearing as tolerated in surgical shoe postop    Return in about 3 weeks (around 10/24/2024) for callus/wound care.   "

## 2024-10-05 ENCOUNTER — Other Ambulatory Visit: Payer: Self-pay | Admitting: Family Medicine

## 2024-10-05 DIAGNOSIS — E559 Vitamin D deficiency, unspecified: Secondary | ICD-10-CM

## 2024-10-05 NOTE — Addendum Note (Signed)
 Addended by: BERNETA ELSIE LABOR on: 10/05/2024 11:53 AM   Modules accepted: Orders

## 2024-10-07 ENCOUNTER — Other Ambulatory Visit: Payer: Self-pay | Admitting: Cardiology

## 2024-10-11 NOTE — Telephone Encounter (Signed)
 Pt had labs done on 08/23/2024 within 365 days within normal range

## 2024-10-24 ENCOUNTER — Ambulatory Visit: Admitting: Podiatry

## 2024-10-26 ENCOUNTER — Ambulatory Visit (HOSPITAL_COMMUNITY): Admitting: Nurse Practitioner

## 2024-12-26 ENCOUNTER — Ambulatory Visit: Admitting: Cardiology

## 2025-01-26 ENCOUNTER — Ambulatory Visit: Admitting: Family Medicine
